# Patient Record
Sex: Female | Born: 1940 | Race: White | Hispanic: No | Smoking: Never smoker
Health system: Southern US, Community
[De-identification: ages and names within clinical notes are randomized; demographics above are authoritative.]

## PROBLEM LIST (undated history)

## (undated) DIAGNOSIS — IMO0001 Reserved for inherently not codable concepts without codable children: Secondary | ICD-10-CM

## (undated) DIAGNOSIS — T7840XA Allergy, unspecified, initial encounter: Secondary | ICD-10-CM

## (undated) DIAGNOSIS — E119 Type 2 diabetes mellitus without complications: Secondary | ICD-10-CM

## (undated) DIAGNOSIS — M329 Systemic lupus erythematosus, unspecified: Secondary | ICD-10-CM

## (undated) DIAGNOSIS — I82409 Acute embolism and thrombosis of unspecified deep veins of unspecified lower extremity: Secondary | ICD-10-CM

## (undated) DIAGNOSIS — Z531 Procedure and treatment not carried out because of patient's decision for reasons of belief and group pressure: Secondary | ICD-10-CM

## (undated) DIAGNOSIS — C4490 Unspecified malignant neoplasm of skin, unspecified: Secondary | ICD-10-CM

## (undated) DIAGNOSIS — I255 Ischemic cardiomyopathy: Secondary | ICD-10-CM

## (undated) DIAGNOSIS — I2699 Other pulmonary embolism without acute cor pulmonale: Secondary | ICD-10-CM

## (undated) DIAGNOSIS — E785 Hyperlipidemia, unspecified: Secondary | ICD-10-CM

## (undated) DIAGNOSIS — I10 Essential (primary) hypertension: Secondary | ICD-10-CM

## (undated) DIAGNOSIS — Z9889 Other specified postprocedural states: Secondary | ICD-10-CM

## (undated) DIAGNOSIS — D0359 Melanoma in situ of other part of trunk: Secondary | ICD-10-CM

## (undated) DIAGNOSIS — K219 Gastro-esophageal reflux disease without esophagitis: Secondary | ICD-10-CM

## (undated) DIAGNOSIS — R7401 Elevation of levels of liver transaminase levels: Secondary | ICD-10-CM

## (undated) DIAGNOSIS — R740 Nonspecific elevation of levels of transaminase and lactic acid dehydrogenase [LDH]: Secondary | ICD-10-CM

## (undated) DIAGNOSIS — R112 Nausea with vomiting, unspecified: Secondary | ICD-10-CM

## (undated) DIAGNOSIS — I5030 Unspecified diastolic (congestive) heart failure: Secondary | ICD-10-CM

## (undated) DIAGNOSIS — M479 Spondylosis, unspecified: Secondary | ICD-10-CM

## (undated) DIAGNOSIS — IMO0002 Reserved for concepts with insufficient information to code with codable children: Secondary | ICD-10-CM

## (undated) DIAGNOSIS — M1990 Unspecified osteoarthritis, unspecified site: Secondary | ICD-10-CM

## (undated) DIAGNOSIS — D689 Coagulation defect, unspecified: Secondary | ICD-10-CM

## (undated) DIAGNOSIS — A0472 Enterocolitis due to Clostridium difficile, not specified as recurrent: Secondary | ICD-10-CM

## (undated) DIAGNOSIS — M8580 Other specified disorders of bone density and structure, unspecified site: Secondary | ICD-10-CM

## (undated) DIAGNOSIS — I2510 Atherosclerotic heart disease of native coronary artery without angina pectoris: Secondary | ICD-10-CM

## (undated) DIAGNOSIS — K635 Polyp of colon: Secondary | ICD-10-CM

## (undated) DIAGNOSIS — C50919 Malignant neoplasm of unspecified site of unspecified female breast: Secondary | ICD-10-CM

## (undated) HISTORY — DX: Gastro-esophageal reflux disease without esophagitis: K21.9

## (undated) HISTORY — PX: APPENDECTOMY: SHX54

## (undated) HISTORY — DX: Acute embolism and thrombosis of unspecified deep veins of unspecified lower extremity: I82.409

## (undated) HISTORY — PX: BREAST LUMPECTOMY: SHX2

## (undated) HISTORY — DX: Malignant neoplasm of unspecified site of unspecified female breast: C50.919

## (undated) HISTORY — PX: CHOLECYSTECTOMY: SHX55

## (undated) HISTORY — DX: Essential (primary) hypertension: I10

## (undated) HISTORY — DX: Allergy, unspecified, initial encounter: T78.40XA

## (undated) HISTORY — DX: Other pulmonary embolism without acute cor pulmonale: I26.99

## (undated) HISTORY — DX: Spondylosis, unspecified: M47.9

## (undated) HISTORY — DX: Unspecified malignant neoplasm of skin, unspecified: C44.90

## (undated) HISTORY — DX: Unspecified osteoarthritis, unspecified site: M19.90

## (undated) HISTORY — DX: Systemic lupus erythematosus, unspecified: M32.9

## (undated) HISTORY — DX: Other specified disorders of bone density and structure, unspecified site: M85.80

## (undated) HISTORY — DX: Enterocolitis due to Clostridium difficile, not specified as recurrent: A04.72

## (undated) HISTORY — PX: BREAST BIOPSY: SHX20

## (undated) HISTORY — DX: Type 2 diabetes mellitus without complications: E11.9

## (undated) HISTORY — PX: SKIN CANCER EXCISION: SHX779

## (undated) HISTORY — DX: Melanoma in situ of other part of trunk: D03.59

## (undated) HISTORY — DX: Coagulation defect, unspecified: D68.9

## (undated) HISTORY — DX: Polyp of colon: K63.5

## (undated) HISTORY — DX: Reserved for concepts with insufficient information to code with codable children: IMO0002

---

## 2002-09-27 ENCOUNTER — Encounter (INDEPENDENT_AMBULATORY_CARE_PROVIDER_SITE_OTHER): Payer: Self-pay | Admitting: *Deleted

## 2002-09-27 ENCOUNTER — Encounter: Payer: Self-pay | Admitting: Family Medicine

## 2002-09-27 ENCOUNTER — Encounter: Admission: RE | Admit: 2002-09-27 | Discharge: 2002-09-27 | Payer: Self-pay | Admitting: Family Medicine

## 2002-11-23 ENCOUNTER — Ambulatory Visit (HOSPITAL_BASED_OUTPATIENT_CLINIC_OR_DEPARTMENT_OTHER): Admission: RE | Admit: 2002-11-23 | Discharge: 2002-11-23 | Payer: Self-pay | Admitting: General Surgery

## 2002-11-23 ENCOUNTER — Encounter: Admission: RE | Admit: 2002-11-23 | Discharge: 2002-11-23 | Payer: Self-pay | Admitting: General Surgery

## 2002-11-23 ENCOUNTER — Encounter: Payer: Self-pay | Admitting: General Surgery

## 2002-11-23 ENCOUNTER — Encounter (INDEPENDENT_AMBULATORY_CARE_PROVIDER_SITE_OTHER): Payer: Self-pay | Admitting: *Deleted

## 2002-12-08 ENCOUNTER — Ambulatory Visit: Admission: RE | Admit: 2002-12-08 | Discharge: 2003-03-08 | Payer: Self-pay | Admitting: Radiation Oncology

## 2003-04-06 ENCOUNTER — Ambulatory Visit: Admission: RE | Admit: 2003-04-06 | Discharge: 2003-04-06 | Payer: Self-pay | Admitting: Radiation Oncology

## 2003-07-14 ENCOUNTER — Encounter: Payer: Self-pay | Admitting: General Surgery

## 2003-07-14 ENCOUNTER — Encounter: Admission: RE | Admit: 2003-07-14 | Discharge: 2003-07-14 | Payer: Self-pay | Admitting: General Surgery

## 2003-11-08 ENCOUNTER — Ambulatory Visit: Admission: RE | Admit: 2003-11-08 | Discharge: 2003-11-08 | Payer: Self-pay | Admitting: Radiation Oncology

## 2003-12-21 ENCOUNTER — Ambulatory Visit: Admission: RE | Admit: 2003-12-21 | Discharge: 2003-12-21 | Payer: Self-pay | Admitting: Radiation Oncology

## 2003-12-26 ENCOUNTER — Encounter: Admission: RE | Admit: 2003-12-26 | Discharge: 2003-12-26 | Payer: Self-pay | Admitting: General Surgery

## 2004-10-04 ENCOUNTER — Ambulatory Visit: Payer: Self-pay | Admitting: Family Medicine

## 2004-10-17 ENCOUNTER — Encounter: Admission: RE | Admit: 2004-10-17 | Discharge: 2004-10-17 | Payer: Self-pay | Admitting: Family Medicine

## 2004-10-21 ENCOUNTER — Ambulatory Visit: Payer: Self-pay | Admitting: Family Medicine

## 2004-10-21 ENCOUNTER — Other Ambulatory Visit: Admission: RE | Admit: 2004-10-21 | Discharge: 2004-10-21 | Payer: Self-pay | Admitting: Family Medicine

## 2004-10-21 LAB — CONVERTED CEMR LAB: Pap Smear: NORMAL

## 2004-10-24 ENCOUNTER — Ambulatory Visit: Payer: Self-pay | Admitting: Gastroenterology

## 2004-10-24 ENCOUNTER — Encounter (INDEPENDENT_AMBULATORY_CARE_PROVIDER_SITE_OTHER): Payer: Self-pay | Admitting: *Deleted

## 2004-10-30 ENCOUNTER — Ambulatory Visit: Payer: Self-pay | Admitting: Gastroenterology

## 2004-11-25 ENCOUNTER — Ambulatory Visit: Payer: Self-pay | Admitting: Gastroenterology

## 2004-12-09 ENCOUNTER — Ambulatory Visit: Payer: Self-pay | Admitting: Internal Medicine

## 2004-12-20 ENCOUNTER — Ambulatory Visit: Payer: Self-pay | Admitting: Family Medicine

## 2004-12-23 ENCOUNTER — Observation Stay (HOSPITAL_COMMUNITY): Admission: RE | Admit: 2004-12-23 | Discharge: 2004-12-24 | Payer: Self-pay | Admitting: General Surgery

## 2004-12-23 ENCOUNTER — Encounter (INDEPENDENT_AMBULATORY_CARE_PROVIDER_SITE_OTHER): Payer: Self-pay | Admitting: Specialist

## 2005-02-24 ENCOUNTER — Encounter: Admission: RE | Admit: 2005-02-24 | Discharge: 2005-02-24 | Payer: Self-pay | Admitting: Radiation Oncology

## 2005-03-31 ENCOUNTER — Ambulatory Visit: Payer: Self-pay | Admitting: Family Medicine

## 2005-04-15 ENCOUNTER — Ambulatory Visit: Payer: Self-pay | Admitting: Family Medicine

## 2005-06-10 ENCOUNTER — Ambulatory Visit: Payer: Self-pay | Admitting: Internal Medicine

## 2005-06-13 ENCOUNTER — Encounter: Admission: RE | Admit: 2005-06-13 | Discharge: 2005-06-13 | Payer: Self-pay | Admitting: Internal Medicine

## 2005-09-08 ENCOUNTER — Ambulatory Visit: Payer: Self-pay | Admitting: Internal Medicine

## 2005-09-26 ENCOUNTER — Ambulatory Visit: Payer: Self-pay | Admitting: Internal Medicine

## 2005-11-07 ENCOUNTER — Ambulatory Visit: Payer: Self-pay | Admitting: Internal Medicine

## 2006-01-09 ENCOUNTER — Ambulatory Visit: Payer: Self-pay | Admitting: Internal Medicine

## 2006-01-23 ENCOUNTER — Ambulatory Visit: Payer: Self-pay | Admitting: Internal Medicine

## 2006-01-27 ENCOUNTER — Ambulatory Visit: Payer: Self-pay | Admitting: Internal Medicine

## 2006-01-27 ENCOUNTER — Encounter (INDEPENDENT_AMBULATORY_CARE_PROVIDER_SITE_OTHER): Payer: Self-pay | Admitting: Specialist

## 2006-02-27 ENCOUNTER — Ambulatory Visit: Payer: Self-pay | Admitting: Internal Medicine

## 2006-03-11 ENCOUNTER — Encounter: Admission: RE | Admit: 2006-03-11 | Discharge: 2006-04-17 | Payer: Self-pay | Admitting: Internal Medicine

## 2006-05-08 ENCOUNTER — Encounter: Admission: RE | Admit: 2006-05-08 | Discharge: 2006-05-08 | Payer: Self-pay | Admitting: Internal Medicine

## 2006-05-08 ENCOUNTER — Ambulatory Visit: Payer: Self-pay | Admitting: Internal Medicine

## 2006-05-29 ENCOUNTER — Ambulatory Visit: Payer: Self-pay | Admitting: Internal Medicine

## 2006-08-28 ENCOUNTER — Ambulatory Visit: Payer: Self-pay | Admitting: Internal Medicine

## 2006-09-28 ENCOUNTER — Ambulatory Visit: Payer: Self-pay | Admitting: Internal Medicine

## 2006-10-01 ENCOUNTER — Encounter: Payer: Self-pay | Admitting: Internal Medicine

## 2006-10-01 ENCOUNTER — Ambulatory Visit: Payer: Self-pay

## 2006-10-13 ENCOUNTER — Ambulatory Visit: Payer: Self-pay | Admitting: Internal Medicine

## 2007-01-01 ENCOUNTER — Ambulatory Visit: Payer: Self-pay | Admitting: Internal Medicine

## 2007-01-01 LAB — CONVERTED CEMR LAB
ALT: 28 units/L (ref 0–40)
Creatinine, Ser: 0.7 mg/dL (ref 0.4–1.2)
Hgb A1c MFr Bld: 9.4 % — ABNORMAL HIGH (ref 4.6–6.0)

## 2007-01-11 ENCOUNTER — Ambulatory Visit: Payer: Self-pay | Admitting: Internal Medicine

## 2007-06-18 ENCOUNTER — Encounter: Admission: RE | Admit: 2007-06-18 | Discharge: 2007-06-18 | Payer: Self-pay | Admitting: Internal Medicine

## 2007-06-18 ENCOUNTER — Encounter (INDEPENDENT_AMBULATORY_CARE_PROVIDER_SITE_OTHER): Payer: Self-pay | Admitting: *Deleted

## 2007-06-22 DIAGNOSIS — M949 Disorder of cartilage, unspecified: Secondary | ICD-10-CM

## 2007-06-22 DIAGNOSIS — M899 Disorder of bone, unspecified: Secondary | ICD-10-CM | POA: Insufficient documentation

## 2007-06-24 ENCOUNTER — Ambulatory Visit: Payer: Self-pay | Admitting: Internal Medicine

## 2007-06-24 DIAGNOSIS — M2550 Pain in unspecified joint: Secondary | ICD-10-CM | POA: Insufficient documentation

## 2007-06-28 LAB — CONVERTED CEMR LAB
Rhuematoid fact SerPl-aCnc: <20 intl units/mL — ABNORMAL LOW (ref 0.0–20.0)
Sed Rate: 27 mm/hr — ABNORMAL HIGH (ref 0–25)

## 2007-06-29 ENCOUNTER — Encounter (INDEPENDENT_AMBULATORY_CARE_PROVIDER_SITE_OTHER): Payer: Self-pay | Admitting: *Deleted

## 2007-08-02 ENCOUNTER — Encounter: Payer: Self-pay | Admitting: Internal Medicine

## 2007-08-13 ENCOUNTER — Ambulatory Visit: Payer: Self-pay | Admitting: Internal Medicine

## 2007-08-13 LAB — CONVERTED CEMR LAB
Bilirubin Urine: NEGATIVE
Glucose, Urine, Semiquant: NEGATIVE
Ketones, urine, test strip: NEGATIVE
Nitrite: NEGATIVE
Specific Gravity, Urine: 1.02
pH: 5

## 2007-09-06 ENCOUNTER — Telehealth (INDEPENDENT_AMBULATORY_CARE_PROVIDER_SITE_OTHER): Payer: Self-pay | Admitting: *Deleted

## 2007-10-12 ENCOUNTER — Ambulatory Visit: Payer: Self-pay | Admitting: Internal Medicine

## 2007-10-19 ENCOUNTER — Telehealth (INDEPENDENT_AMBULATORY_CARE_PROVIDER_SITE_OTHER): Payer: Self-pay | Admitting: *Deleted

## 2008-03-23 ENCOUNTER — Telehealth (INDEPENDENT_AMBULATORY_CARE_PROVIDER_SITE_OTHER): Payer: Self-pay | Admitting: *Deleted

## 2008-03-29 ENCOUNTER — Encounter: Payer: Self-pay | Admitting: Internal Medicine

## 2008-04-06 ENCOUNTER — Telehealth (INDEPENDENT_AMBULATORY_CARE_PROVIDER_SITE_OTHER): Payer: Self-pay | Admitting: *Deleted

## 2008-04-19 ENCOUNTER — Ambulatory Visit: Payer: Self-pay | Admitting: Internal Medicine

## 2008-04-21 ENCOUNTER — Encounter: Payer: Self-pay | Admitting: Internal Medicine

## 2008-04-27 ENCOUNTER — Telehealth (INDEPENDENT_AMBULATORY_CARE_PROVIDER_SITE_OTHER): Payer: Self-pay | Admitting: *Deleted

## 2008-05-16 ENCOUNTER — Encounter: Payer: Self-pay | Admitting: Internal Medicine

## 2008-05-17 ENCOUNTER — Ambulatory Visit: Payer: Self-pay | Admitting: Internal Medicine

## 2008-05-18 ENCOUNTER — Telehealth: Payer: Self-pay | Admitting: Family Medicine

## 2008-05-18 ENCOUNTER — Ambulatory Visit: Payer: Self-pay | Admitting: Internal Medicine

## 2008-05-18 ENCOUNTER — Inpatient Hospital Stay (HOSPITAL_COMMUNITY): Admission: EM | Admit: 2008-05-18 | Discharge: 2008-05-20 | Payer: Self-pay | Admitting: Emergency Medicine

## 2008-05-18 ENCOUNTER — Ambulatory Visit: Payer: Self-pay

## 2008-05-22 ENCOUNTER — Ambulatory Visit: Payer: Self-pay | Admitting: Internal Medicine

## 2008-05-25 ENCOUNTER — Ambulatory Visit: Payer: Self-pay | Admitting: Internal Medicine

## 2008-05-25 LAB — CONVERTED CEMR LAB
INR: 1.9
Prothrombin Time: 17.1 s

## 2008-05-29 ENCOUNTER — Ambulatory Visit: Payer: Self-pay | Admitting: Internal Medicine

## 2008-05-29 LAB — CONVERTED CEMR LAB: INR: 2.9

## 2008-05-30 ENCOUNTER — Encounter: Payer: Self-pay | Admitting: Internal Medicine

## 2008-06-01 ENCOUNTER — Ambulatory Visit: Payer: Self-pay | Admitting: Internal Medicine

## 2008-06-01 LAB — CONVERTED CEMR LAB
INR: 19.3
Prothrombin Time: 2.5 s

## 2008-06-12 ENCOUNTER — Ambulatory Visit: Payer: Self-pay | Admitting: Family Medicine

## 2008-06-12 ENCOUNTER — Ambulatory Visit: Payer: Self-pay | Admitting: Internal Medicine

## 2008-06-12 LAB — CONVERTED CEMR LAB
Glucose, Urine, Semiquant: NEGATIVE
INR: 3
Nitrite: NEGATIVE
Prothrombin Time: 20.8 s
Specific Gravity, Urine: 1.015

## 2008-06-15 ENCOUNTER — Telehealth (INDEPENDENT_AMBULATORY_CARE_PROVIDER_SITE_OTHER): Payer: Self-pay | Admitting: *Deleted

## 2008-06-19 ENCOUNTER — Ambulatory Visit: Payer: Self-pay | Admitting: Internal Medicine

## 2008-06-19 LAB — CONVERTED CEMR LAB
INR: 2.5
Prothrombin Time: 19.2 s

## 2008-06-29 ENCOUNTER — Emergency Department (HOSPITAL_BASED_OUTPATIENT_CLINIC_OR_DEPARTMENT_OTHER): Admission: EM | Admit: 2008-06-29 | Discharge: 2008-06-30 | Payer: Self-pay | Admitting: Emergency Medicine

## 2008-07-06 ENCOUNTER — Encounter: Admission: RE | Admit: 2008-07-06 | Discharge: 2008-07-06 | Payer: Self-pay | Admitting: Internal Medicine

## 2008-07-10 ENCOUNTER — Ambulatory Visit: Payer: Self-pay | Admitting: Internal Medicine

## 2008-07-10 LAB — CONVERTED CEMR LAB: Prothrombin Time: 21.5 s

## 2008-07-31 ENCOUNTER — Ambulatory Visit: Payer: Self-pay | Admitting: Internal Medicine

## 2008-07-31 LAB — CONVERTED CEMR LAB
Bilirubin Urine: NEGATIVE
Hemoglobin: 13.2 g/dL
Ketones, urine, test strip: NEGATIVE
Nitrite: NEGATIVE
Protein, U semiquant: NEGATIVE
RBC / HPF: NONE SEEN (ref <3)
Squamous Epithelial / LPF: NONE SEEN /lpf
Urobilinogen, UA: 0.2

## 2008-08-01 ENCOUNTER — Encounter: Payer: Self-pay | Admitting: Internal Medicine

## 2008-08-02 ENCOUNTER — Telehealth: Payer: Self-pay | Admitting: Internal Medicine

## 2008-08-10 ENCOUNTER — Ambulatory Visit: Payer: Self-pay | Admitting: Internal Medicine

## 2008-08-10 LAB — CONVERTED CEMR LAB: INR: 3.7

## 2008-08-21 ENCOUNTER — Ambulatory Visit: Payer: Self-pay | Admitting: Internal Medicine

## 2008-08-21 ENCOUNTER — Telehealth (INDEPENDENT_AMBULATORY_CARE_PROVIDER_SITE_OTHER): Payer: Self-pay | Admitting: *Deleted

## 2008-08-21 ENCOUNTER — Ambulatory Visit: Payer: Self-pay | Admitting: Cardiology

## 2008-08-21 LAB — CONVERTED CEMR LAB
BUN: 15 mg/dL (ref 6–23)
Basophils Absolute: 0.1 10*3/uL (ref 0.0–0.1)
Basophils Relative: 0.9 % (ref 0.0–3.0)
Bilirubin Urine: NEGATIVE
Blood in Urine, dipstick: NEGATIVE
CO2: 29 meq/L (ref 19–32)
Calcium: 9.2 mg/dL (ref 8.4–10.5)
Chloride: 104 meq/L (ref 96–112)
Creatinine, Ser: 0.7 mg/dL (ref 0.4–1.2)
Eosinophils Absolute: 0.1 10*3/uL (ref 0.0–0.7)
Eosinophils Relative: 1.1 % (ref 0.0–5.0)
GFR calc Af Amer: 107 mL/min
GFR calc non Af Amer: 88 mL/min
Glucose, Bld: 215 mg/dL — ABNORMAL HIGH (ref 70–99)
Glucose, Urine, Semiquant: NEGATIVE
HCT: 37.5 % (ref 36.0–46.0)
Hemoglobin: 11.2 g/dL
Hemoglobin: 12.5 g/dL (ref 12.0–15.0)
INR: 2.3
Ketones, urine, test strip: NEGATIVE
Lymphocytes Relative: 25.1 % (ref 12.0–46.0)
MCHC: 33.2 g/dL (ref 30.0–36.0)
MCV: 82.2 fL (ref 78.0–100.0)
Monocytes Absolute: 0.5 10*3/uL (ref 0.1–1.0)
Monocytes Relative: 6.5 % (ref 3.0–12.0)
Neutro Abs: 5.2 10*3/uL (ref 1.4–7.7)
Neutrophils Relative %: 66.4 % (ref 43.0–77.0)
Nitrite: NEGATIVE
Platelets: 188 10*3/uL (ref 150–400)
Potassium: 4.3 meq/L (ref 3.5–5.1)
Protein, U semiquant: NEGATIVE
Prothrombin Time: 18.4 s
RBC: 4.57 M/uL (ref 3.87–5.11)
RDW: 13.1 % (ref 11.5–14.6)
Sodium: 139 meq/L (ref 135–145)
Specific Gravity, Urine: 1.01
Urobilinogen, UA: 0.2
WBC Urine, dipstick: NEGATIVE
WBC: 7.9 10*3/uL (ref 4.5–10.5)
pH: 5

## 2008-08-22 ENCOUNTER — Telehealth: Payer: Self-pay | Admitting: Internal Medicine

## 2008-08-22 ENCOUNTER — Encounter: Payer: Self-pay | Admitting: Internal Medicine

## 2008-08-28 ENCOUNTER — Telehealth (INDEPENDENT_AMBULATORY_CARE_PROVIDER_SITE_OTHER): Payer: Self-pay | Admitting: *Deleted

## 2008-08-31 ENCOUNTER — Observation Stay (HOSPITAL_COMMUNITY): Admission: EM | Admit: 2008-08-31 | Discharge: 2008-09-01 | Payer: Self-pay | Admitting: Emergency Medicine

## 2008-08-31 ENCOUNTER — Ambulatory Visit: Payer: Self-pay | Admitting: Internal Medicine

## 2008-09-01 ENCOUNTER — Encounter (INDEPENDENT_AMBULATORY_CARE_PROVIDER_SITE_OTHER): Payer: Self-pay | Admitting: *Deleted

## 2008-09-04 ENCOUNTER — Ambulatory Visit: Payer: Self-pay | Admitting: Internal Medicine

## 2008-09-04 DIAGNOSIS — K219 Gastro-esophageal reflux disease without esophagitis: Secondary | ICD-10-CM

## 2008-09-05 ENCOUNTER — Encounter (INDEPENDENT_AMBULATORY_CARE_PROVIDER_SITE_OTHER): Payer: Self-pay | Admitting: *Deleted

## 2008-09-06 ENCOUNTER — Telehealth: Payer: Self-pay | Admitting: Internal Medicine

## 2008-09-07 ENCOUNTER — Ambulatory Visit: Payer: Self-pay | Admitting: Internal Medicine

## 2008-09-07 DIAGNOSIS — R197 Diarrhea, unspecified: Secondary | ICD-10-CM | POA: Insufficient documentation

## 2008-09-07 LAB — CONVERTED CEMR LAB
Bilirubin Urine: NEGATIVE
Glucose, Urine, Semiquant: 500
Protein, U semiquant: NEGATIVE

## 2008-09-08 ENCOUNTER — Encounter: Payer: Self-pay | Admitting: Internal Medicine

## 2008-09-08 ENCOUNTER — Encounter (INDEPENDENT_AMBULATORY_CARE_PROVIDER_SITE_OTHER): Payer: Self-pay | Admitting: *Deleted

## 2008-09-13 ENCOUNTER — Ambulatory Visit: Payer: Self-pay

## 2008-09-13 ENCOUNTER — Encounter: Payer: Self-pay | Admitting: Internal Medicine

## 2008-09-13 ENCOUNTER — Encounter: Payer: Self-pay | Admitting: Cardiovascular Disease

## 2008-09-13 ENCOUNTER — Telehealth: Payer: Self-pay | Admitting: Internal Medicine

## 2008-09-14 ENCOUNTER — Telehealth (INDEPENDENT_AMBULATORY_CARE_PROVIDER_SITE_OTHER): Payer: Self-pay | Admitting: *Deleted

## 2008-09-14 ENCOUNTER — Ambulatory Visit: Payer: Self-pay | Admitting: Internal Medicine

## 2008-09-15 ENCOUNTER — Encounter: Payer: Self-pay | Admitting: Internal Medicine

## 2008-09-15 LAB — CONVERTED CEMR LAB
Hemoglobin, Urine: NEGATIVE
Nitrite: NEGATIVE
Protein, ur: NEGATIVE mg/dL
Urobilinogen, UA: 0.2 (ref 0.0–1.0)

## 2008-09-16 ENCOUNTER — Encounter: Payer: Self-pay | Admitting: Internal Medicine

## 2008-09-18 ENCOUNTER — Ambulatory Visit: Payer: Self-pay | Admitting: Internal Medicine

## 2008-09-21 ENCOUNTER — Telehealth (INDEPENDENT_AMBULATORY_CARE_PROVIDER_SITE_OTHER): Payer: Self-pay | Admitting: *Deleted

## 2008-10-09 DIAGNOSIS — I10 Essential (primary) hypertension: Secondary | ICD-10-CM

## 2008-10-09 DIAGNOSIS — M1990 Unspecified osteoarthritis, unspecified site: Secondary | ICD-10-CM | POA: Insufficient documentation

## 2008-10-09 DIAGNOSIS — Z860100 Personal history of colon polyps, unspecified: Secondary | ICD-10-CM | POA: Insufficient documentation

## 2008-10-10 ENCOUNTER — Ambulatory Visit: Payer: Self-pay | Admitting: Internal Medicine

## 2008-10-27 ENCOUNTER — Telehealth (INDEPENDENT_AMBULATORY_CARE_PROVIDER_SITE_OTHER): Payer: Self-pay | Admitting: *Deleted

## 2008-11-24 ENCOUNTER — Telehealth (INDEPENDENT_AMBULATORY_CARE_PROVIDER_SITE_OTHER): Payer: Self-pay | Admitting: *Deleted

## 2008-12-01 ENCOUNTER — Telehealth (INDEPENDENT_AMBULATORY_CARE_PROVIDER_SITE_OTHER): Payer: Self-pay | Admitting: *Deleted

## 2008-12-06 ENCOUNTER — Telehealth (INDEPENDENT_AMBULATORY_CARE_PROVIDER_SITE_OTHER): Payer: Self-pay | Admitting: *Deleted

## 2008-12-14 ENCOUNTER — Telehealth (INDEPENDENT_AMBULATORY_CARE_PROVIDER_SITE_OTHER): Payer: Self-pay | Admitting: *Deleted

## 2008-12-20 ENCOUNTER — Inpatient Hospital Stay (HOSPITAL_COMMUNITY): Admission: EM | Admit: 2008-12-20 | Discharge: 2008-12-22 | Payer: Self-pay | Admitting: Emergency Medicine

## 2008-12-20 ENCOUNTER — Encounter (INDEPENDENT_AMBULATORY_CARE_PROVIDER_SITE_OTHER): Payer: Self-pay | Admitting: Surgery

## 2008-12-20 ENCOUNTER — Ambulatory Visit: Payer: Self-pay | Admitting: Internal Medicine

## 2008-12-20 ENCOUNTER — Telehealth (INDEPENDENT_AMBULATORY_CARE_PROVIDER_SITE_OTHER): Payer: Self-pay | Admitting: *Deleted

## 2008-12-20 ENCOUNTER — Ambulatory Visit: Payer: Self-pay | Admitting: Interventional Radiology

## 2008-12-20 ENCOUNTER — Encounter: Payer: Self-pay | Admitting: Emergency Medicine

## 2008-12-21 ENCOUNTER — Encounter (INDEPENDENT_AMBULATORY_CARE_PROVIDER_SITE_OTHER): Payer: Self-pay | Admitting: Internal Medicine

## 2008-12-21 ENCOUNTER — Ambulatory Visit: Payer: Self-pay | Admitting: Oncology

## 2008-12-21 ENCOUNTER — Encounter: Payer: Self-pay | Admitting: Internal Medicine

## 2008-12-21 ENCOUNTER — Ambulatory Visit: Payer: Self-pay | Admitting: Surgery

## 2008-12-26 ENCOUNTER — Ambulatory Visit: Payer: Self-pay | Admitting: Oncology

## 2009-01-01 ENCOUNTER — Ambulatory Visit: Payer: Self-pay | Admitting: Internal Medicine

## 2009-01-16 ENCOUNTER — Encounter: Payer: Self-pay | Admitting: Internal Medicine

## 2009-01-30 ENCOUNTER — Telehealth: Payer: Self-pay | Admitting: Internal Medicine

## 2009-02-28 ENCOUNTER — Telehealth (INDEPENDENT_AMBULATORY_CARE_PROVIDER_SITE_OTHER): Payer: Self-pay | Admitting: *Deleted

## 2009-04-04 ENCOUNTER — Ambulatory Visit: Payer: Self-pay | Admitting: Family Medicine

## 2009-04-17 ENCOUNTER — Telehealth (INDEPENDENT_AMBULATORY_CARE_PROVIDER_SITE_OTHER): Payer: Self-pay | Admitting: *Deleted

## 2009-04-17 ENCOUNTER — Ambulatory Visit: Payer: Self-pay | Admitting: Oncology

## 2009-04-17 LAB — CONVERTED CEMR LAB
BUN: 13 mg/dL
CO2, serum: 29 mmol/L
Calcium: 9.5 mg/dL
Potassium, serum: 4.6 mmol/L
Sodium, serum: 139 mmol/L

## 2009-04-19 ENCOUNTER — Encounter: Payer: Self-pay | Admitting: Internal Medicine

## 2009-04-19 LAB — CBC WITH DIFFERENTIAL/PLATELET
BASO%: 0.4 % (ref 0.0–2.0)
Basophils Absolute: 0 10*3/uL (ref 0.0–0.1)
EOS%: 0.9 % (ref 0.0–7.0)
HCT: 37.5 % (ref 34.8–46.6)
HGB: 12.4 g/dL (ref 11.6–15.9)
LYMPH%: 20.3 % (ref 14.0–49.7)
MCH: 27.6 pg (ref 25.1–34.0)
MCHC: 33.1 g/dL (ref 31.5–36.0)
MCV: 83.2 fL (ref 79.5–101.0)
MONO%: 5.7 % (ref 0.0–14.0)
NEUT%: 72.7 % (ref 38.4–76.8)
lymph#: 1.8 10*3/uL (ref 0.9–3.3)

## 2009-04-19 LAB — COMPREHENSIVE METABOLIC PANEL
ALT: 21 U/L (ref 0–35)
AST: 17 U/L (ref 0–37)
Alkaline Phosphatase: 73 U/L (ref 39–117)
BUN: 18 mg/dL (ref 6–23)
Chloride: 105 mEq/L (ref 96–112)
Creatinine, Ser: 0.84 mg/dL (ref 0.40–1.20)
Total Bilirubin: 0.5 mg/dL (ref 0.3–1.2)

## 2009-04-30 ENCOUNTER — Ambulatory Visit: Payer: Self-pay | Admitting: *Deleted

## 2009-04-30 ENCOUNTER — Ambulatory Visit: Admission: RE | Admit: 2009-04-30 | Discharge: 2009-04-30 | Payer: Self-pay | Admitting: Oncology

## 2009-04-30 ENCOUNTER — Encounter: Payer: Self-pay | Admitting: Oncology

## 2009-05-04 ENCOUNTER — Ambulatory Visit: Payer: Self-pay | Admitting: Internal Medicine

## 2009-05-11 ENCOUNTER — Ambulatory Visit: Payer: Self-pay | Admitting: Internal Medicine

## 2009-05-17 ENCOUNTER — Ambulatory Visit: Payer: Self-pay | Admitting: Internal Medicine

## 2009-05-24 ENCOUNTER — Encounter (INDEPENDENT_AMBULATORY_CARE_PROVIDER_SITE_OTHER): Payer: Self-pay | Admitting: *Deleted

## 2009-05-24 LAB — CONVERTED CEMR LAB
Cholesterol: 174 mg/dL (ref 0–200)
HDL: 32.4 mg/dL — ABNORMAL LOW (ref >39.00)
VLDL: 19.4 mg/dL (ref 0.0–40.0)

## 2009-05-29 ENCOUNTER — Telehealth (INDEPENDENT_AMBULATORY_CARE_PROVIDER_SITE_OTHER): Payer: Self-pay | Admitting: *Deleted

## 2009-06-26 ENCOUNTER — Telehealth (INDEPENDENT_AMBULATORY_CARE_PROVIDER_SITE_OTHER): Payer: Self-pay | Admitting: *Deleted

## 2009-06-27 ENCOUNTER — Ambulatory Visit: Payer: Self-pay | Admitting: Oncology

## 2009-06-29 ENCOUNTER — Encounter: Payer: Self-pay | Admitting: Internal Medicine

## 2009-06-29 LAB — CBC WITH DIFFERENTIAL/PLATELET
BASO%: 0.4 % (ref 0.0–2.0)
EOS%: 1.4 % (ref 0.0–7.0)
MCH: 27.6 pg (ref 25.1–34.0)
MCHC: 33.1 g/dL (ref 31.5–36.0)
MONO%: 7.8 % (ref 0.0–14.0)
RDW: 13.6 % (ref 11.2–14.5)
lymph#: 2 10*3/uL (ref 0.9–3.3)

## 2009-06-29 LAB — COMPREHENSIVE METABOLIC PANEL
ALT: 27 U/L (ref 0–35)
AST: 22 U/L (ref 0–37)
Alkaline Phosphatase: 83 U/L (ref 39–117)
Calcium: 10 mg/dL (ref 8.4–10.5)
Chloride: 107 mEq/L (ref 96–112)
Creatinine, Ser: 0.83 mg/dL (ref 0.40–1.20)
Potassium: 4.7 mEq/L (ref 3.5–5.3)

## 2009-07-02 ENCOUNTER — Telehealth (INDEPENDENT_AMBULATORY_CARE_PROVIDER_SITE_OTHER): Payer: Self-pay | Admitting: *Deleted

## 2009-07-04 ENCOUNTER — Telehealth (INDEPENDENT_AMBULATORY_CARE_PROVIDER_SITE_OTHER): Payer: Self-pay | Admitting: *Deleted

## 2009-07-12 ENCOUNTER — Encounter: Admission: RE | Admit: 2009-07-12 | Discharge: 2009-07-12 | Payer: Self-pay | Admitting: Internal Medicine

## 2009-07-17 ENCOUNTER — Telehealth (INDEPENDENT_AMBULATORY_CARE_PROVIDER_SITE_OTHER): Payer: Self-pay | Admitting: *Deleted

## 2009-07-27 ENCOUNTER — Ambulatory Visit: Payer: Self-pay | Admitting: Internal Medicine

## 2009-07-27 DIAGNOSIS — R109 Unspecified abdominal pain: Secondary | ICD-10-CM | POA: Insufficient documentation

## 2009-07-27 DIAGNOSIS — R519 Headache, unspecified: Secondary | ICD-10-CM | POA: Insufficient documentation

## 2009-07-27 DIAGNOSIS — M79609 Pain in unspecified limb: Secondary | ICD-10-CM

## 2009-07-27 DIAGNOSIS — R51 Headache: Secondary | ICD-10-CM

## 2009-07-27 LAB — CONVERTED CEMR LAB
Bilirubin Urine: NEGATIVE
Protein, U semiquant: NEGATIVE
Specific Gravity, Urine: 1.015
Urobilinogen, UA: 0.2

## 2009-07-28 ENCOUNTER — Encounter: Payer: Self-pay | Admitting: Internal Medicine

## 2009-07-28 LAB — CONVERTED CEMR LAB: RBC / HPF: NONE SEEN (ref <3)

## 2009-08-01 ENCOUNTER — Encounter: Admission: RE | Admit: 2009-08-01 | Discharge: 2009-08-01 | Payer: Self-pay | Admitting: Internal Medicine

## 2009-08-02 ENCOUNTER — Telehealth: Payer: Self-pay | Admitting: Internal Medicine

## 2009-08-04 ENCOUNTER — Encounter: Admission: RE | Admit: 2009-08-04 | Discharge: 2009-08-04 | Payer: Self-pay | Admitting: Internal Medicine

## 2009-08-07 ENCOUNTER — Encounter: Payer: Self-pay | Admitting: Internal Medicine

## 2009-08-09 ENCOUNTER — Telehealth: Payer: Self-pay | Admitting: Internal Medicine

## 2009-08-15 ENCOUNTER — Telehealth: Payer: Self-pay | Admitting: Internal Medicine

## 2009-08-21 ENCOUNTER — Encounter: Payer: Self-pay | Admitting: Internal Medicine

## 2009-08-29 ENCOUNTER — Ambulatory Visit: Payer: Self-pay | Admitting: Oncology

## 2009-09-05 ENCOUNTER — Telehealth (INDEPENDENT_AMBULATORY_CARE_PROVIDER_SITE_OTHER): Payer: Self-pay | Admitting: *Deleted

## 2009-09-13 ENCOUNTER — Encounter: Payer: Self-pay | Admitting: Internal Medicine

## 2009-09-20 ENCOUNTER — Ambulatory Visit: Payer: Self-pay | Admitting: Internal Medicine

## 2009-09-20 ENCOUNTER — Telehealth (INDEPENDENT_AMBULATORY_CARE_PROVIDER_SITE_OTHER): Payer: Self-pay | Admitting: *Deleted

## 2009-09-21 ENCOUNTER — Encounter: Payer: Self-pay | Admitting: Internal Medicine

## 2009-09-28 ENCOUNTER — Encounter (INDEPENDENT_AMBULATORY_CARE_PROVIDER_SITE_OTHER): Payer: Self-pay | Admitting: *Deleted

## 2009-10-01 ENCOUNTER — Encounter: Payer: Self-pay | Admitting: Internal Medicine

## 2009-10-03 ENCOUNTER — Ambulatory Visit: Payer: Self-pay | Admitting: Oncology

## 2009-10-05 ENCOUNTER — Encounter: Payer: Self-pay | Admitting: Oncology

## 2009-10-05 ENCOUNTER — Ambulatory Visit (HOSPITAL_COMMUNITY): Admission: RE | Admit: 2009-10-05 | Discharge: 2009-10-05 | Payer: Self-pay | Admitting: Oncology

## 2009-10-05 ENCOUNTER — Ambulatory Visit: Payer: Self-pay | Admitting: Vascular Surgery

## 2009-10-05 ENCOUNTER — Ambulatory Visit: Admission: RE | Admit: 2009-10-05 | Discharge: 2009-10-05 | Payer: Self-pay | Admitting: Oncology

## 2009-10-05 ENCOUNTER — Encounter (INDEPENDENT_AMBULATORY_CARE_PROVIDER_SITE_OTHER): Payer: Self-pay | Admitting: *Deleted

## 2009-10-10 ENCOUNTER — Encounter: Payer: Self-pay | Admitting: Internal Medicine

## 2009-10-10 DIAGNOSIS — R0989 Other specified symptoms and signs involving the circulatory and respiratory systems: Secondary | ICD-10-CM

## 2009-10-12 ENCOUNTER — Encounter: Payer: Self-pay | Admitting: Internal Medicine

## 2009-10-12 ENCOUNTER — Ambulatory Visit: Payer: Self-pay

## 2009-10-18 ENCOUNTER — Encounter (INDEPENDENT_AMBULATORY_CARE_PROVIDER_SITE_OTHER): Payer: Self-pay | Admitting: *Deleted

## 2009-10-29 ENCOUNTER — Encounter: Payer: Self-pay | Admitting: Internal Medicine

## 2009-11-05 ENCOUNTER — Inpatient Hospital Stay (HOSPITAL_COMMUNITY): Admission: EM | Admit: 2009-11-05 | Discharge: 2009-11-07 | Payer: Self-pay | Admitting: Internal Medicine

## 2009-11-05 ENCOUNTER — Encounter: Payer: Self-pay | Admitting: Emergency Medicine

## 2009-11-05 ENCOUNTER — Telehealth: Payer: Self-pay | Admitting: Internal Medicine

## 2009-11-05 ENCOUNTER — Ambulatory Visit: Payer: Self-pay | Admitting: Diagnostic Radiology

## 2009-11-05 ENCOUNTER — Ambulatory Visit: Payer: Self-pay | Admitting: Cardiology

## 2009-11-06 ENCOUNTER — Telehealth: Payer: Self-pay | Admitting: Internal Medicine

## 2009-11-12 ENCOUNTER — Encounter: Payer: Self-pay | Admitting: Internal Medicine

## 2009-11-12 ENCOUNTER — Encounter: Admission: RE | Admit: 2009-11-12 | Discharge: 2009-11-12 | Payer: Self-pay | Admitting: Diagnostic Neuroimaging

## 2009-11-15 ENCOUNTER — Telehealth (INDEPENDENT_AMBULATORY_CARE_PROVIDER_SITE_OTHER): Payer: Self-pay | Admitting: *Deleted

## 2009-11-19 ENCOUNTER — Ambulatory Visit: Payer: Self-pay | Admitting: Internal Medicine

## 2009-11-19 DIAGNOSIS — M542 Cervicalgia: Secondary | ICD-10-CM | POA: Insufficient documentation

## 2009-11-19 DIAGNOSIS — R42 Dizziness and giddiness: Secondary | ICD-10-CM

## 2009-11-26 LAB — CONVERTED CEMR LAB
Basophils Relative: 0.5 % (ref 0.0–3.0)
Hemoglobin: 12.7 g/dL (ref 12.0–15.0)
Lymphocytes Relative: 24.9 % (ref 12.0–46.0)
Monocytes Relative: 6.3 % (ref 3.0–12.0)
Neutro Abs: 5.7 10*3/uL (ref 1.4–7.7)
RBC: 4.38 M/uL (ref 3.87–5.11)
WBC: 8.4 10*3/uL (ref 4.5–10.5)

## 2009-11-29 ENCOUNTER — Telehealth (INDEPENDENT_AMBULATORY_CARE_PROVIDER_SITE_OTHER): Payer: Self-pay | Admitting: *Deleted

## 2009-12-03 ENCOUNTER — Ambulatory Visit: Payer: Self-pay | Admitting: Oncology

## 2009-12-20 ENCOUNTER — Telehealth (INDEPENDENT_AMBULATORY_CARE_PROVIDER_SITE_OTHER): Payer: Self-pay | Admitting: *Deleted

## 2009-12-21 ENCOUNTER — Encounter: Payer: Self-pay | Admitting: Internal Medicine

## 2009-12-25 ENCOUNTER — Ambulatory Visit (HOSPITAL_BASED_OUTPATIENT_CLINIC_OR_DEPARTMENT_OTHER): Admission: RE | Admit: 2009-12-25 | Discharge: 2009-12-25 | Payer: Self-pay | Admitting: Internal Medicine

## 2009-12-25 ENCOUNTER — Ambulatory Visit: Payer: Self-pay | Admitting: Internal Medicine

## 2009-12-25 ENCOUNTER — Ambulatory Visit: Payer: Self-pay | Admitting: Diagnostic Radiology

## 2009-12-25 ENCOUNTER — Telehealth (INDEPENDENT_AMBULATORY_CARE_PROVIDER_SITE_OTHER): Payer: Self-pay | Admitting: *Deleted

## 2009-12-25 LAB — CONVERTED CEMR LAB: Hemoglobin: 13.4 g/dL

## 2009-12-31 ENCOUNTER — Telehealth (INDEPENDENT_AMBULATORY_CARE_PROVIDER_SITE_OTHER): Payer: Self-pay | Admitting: *Deleted

## 2010-01-02 ENCOUNTER — Telehealth: Payer: Self-pay | Admitting: Internal Medicine

## 2010-01-02 DIAGNOSIS — H9209 Otalgia, unspecified ear: Secondary | ICD-10-CM | POA: Insufficient documentation

## 2010-01-04 ENCOUNTER — Telehealth (INDEPENDENT_AMBULATORY_CARE_PROVIDER_SITE_OTHER): Payer: Self-pay | Admitting: *Deleted

## 2010-01-11 ENCOUNTER — Encounter: Payer: Self-pay | Admitting: Internal Medicine

## 2010-01-11 ENCOUNTER — Ambulatory Visit: Payer: Self-pay | Admitting: Family

## 2010-01-11 LAB — CONVERTED CEMR LAB
ALT: 25 units/L (ref 0–35)
AST: 23 units/L (ref 0–37)
Basophils Absolute: 0 10*3/uL (ref 0.0–0.1)
Basophils Relative: 0.2 % (ref 0.0–3.0)
Bilirubin, Direct: 0.1 mg/dL (ref 0.0–0.3)
CO2: 30 meq/L (ref 19–32)
Chloride: 103 meq/L (ref 96–112)
Creatinine, Ser: 0.7 mg/dL (ref 0.4–1.2)
Eosinophils Absolute: 0.1 10*3/uL (ref 0.0–0.7)
INR: 1 (ref 0.8–1.0)
Lymphocytes Relative: 21.7 % (ref 12.0–46.0)
MCHC: 33.2 g/dL (ref 30.0–36.0)
MCV: 84 fL (ref 78.0–100.0)
Monocytes Absolute: 0.7 10*3/uL (ref 0.1–1.0)
Neutrophils Relative %: 70.6 % (ref 43.0–77.0)
Platelets: 188 10*3/uL (ref 150.0–400.0)
Potassium: 4.7 meq/L (ref 3.5–5.1)
Prothrombin Time: 9.9 s (ref 9.1–11.7)
RBC: 4.45 M/uL (ref 3.87–5.11)
RDW: 12.6 % (ref 11.5–14.6)
Total Bilirubin: 0.5 mg/dL (ref 0.3–1.2)
Total Protein: 7.4 g/dL (ref 6.0–8.3)
aPTT: 24.3 s (ref 21.7–28.8)

## 2010-01-14 ENCOUNTER — Encounter: Payer: Self-pay | Admitting: Family

## 2010-01-14 ENCOUNTER — Telehealth: Payer: Self-pay | Admitting: Family

## 2010-01-15 ENCOUNTER — Encounter: Payer: Self-pay | Admitting: Family

## 2010-01-16 ENCOUNTER — Encounter: Payer: Self-pay | Admitting: Family

## 2010-01-20 ENCOUNTER — Telehealth: Payer: Self-pay | Admitting: Family

## 2010-01-21 ENCOUNTER — Encounter: Payer: Self-pay | Admitting: Internal Medicine

## 2010-01-22 ENCOUNTER — Telehealth (INDEPENDENT_AMBULATORY_CARE_PROVIDER_SITE_OTHER): Payer: Self-pay | Admitting: *Deleted

## 2010-01-29 ENCOUNTER — Ambulatory Visit: Payer: Self-pay | Admitting: Diagnostic Radiology

## 2010-01-29 ENCOUNTER — Ambulatory Visit (HOSPITAL_BASED_OUTPATIENT_CLINIC_OR_DEPARTMENT_OTHER): Admission: RE | Admit: 2010-01-29 | Discharge: 2010-01-29 | Payer: Self-pay | Admitting: Internal Medicine

## 2010-01-30 ENCOUNTER — Encounter: Payer: Self-pay | Admitting: Family

## 2010-01-30 ENCOUNTER — Telehealth: Payer: Self-pay | Admitting: Family

## 2010-02-04 ENCOUNTER — Telehealth (INDEPENDENT_AMBULATORY_CARE_PROVIDER_SITE_OTHER): Payer: Self-pay | Admitting: *Deleted

## 2010-02-19 IMAGING — CT CT ANGIO CHEST
2 of 6 series · 18 of 36 positions shown · IV contrast (APPLIED)
Comparison: None

CLINICAL DATA: Right-sided pleuritic chest pain.  Known history of
DVT.  Breast carcinoma.  Clinical suspicion for pulmonary embolism.

CT ANGIOGRAPHY CHEST
TECHNIQUE: Multidetector CT imaging of the chest using the
standard protocol during bolus administration of intravenous
contrast. Multiplanar reconstructed images including MIPs were
obtained and reviewed to evaluate the vascular anatomy.
Contrast: 80 ml Omnipaque 300

[Series 8: pulm embolism 1.0 b25f thins · axial · 0.63mm/px · z∈[+1178,+1418]mm · 15 of 276 slices shown]
[im 18/276  lung]
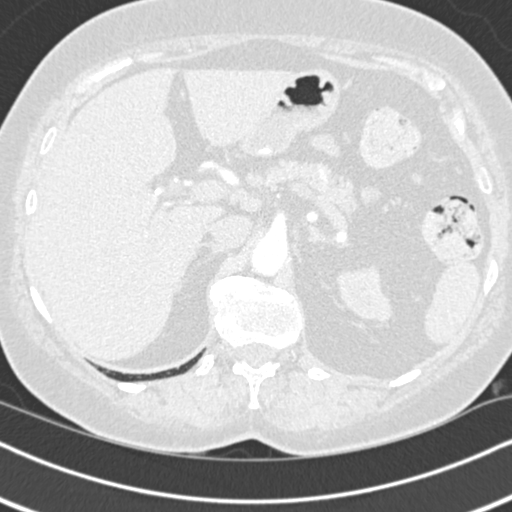
[im 35/276  mediastinal]
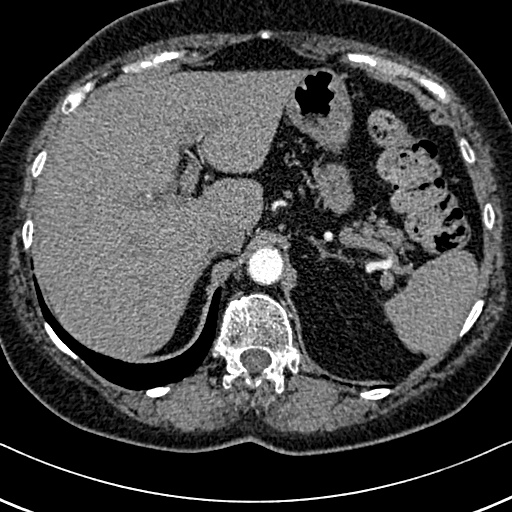
[im 52/276  lung]
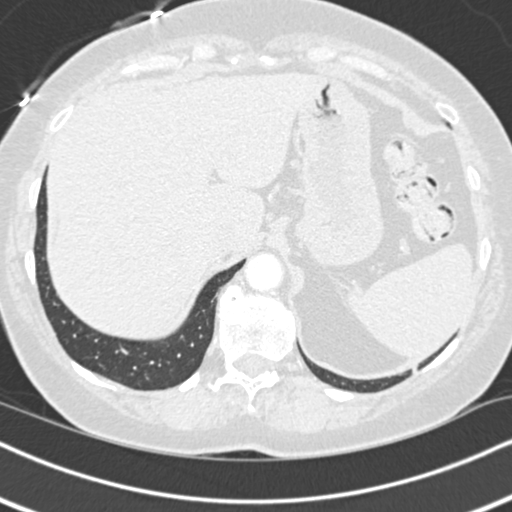
[im 69/276  mediastinal]
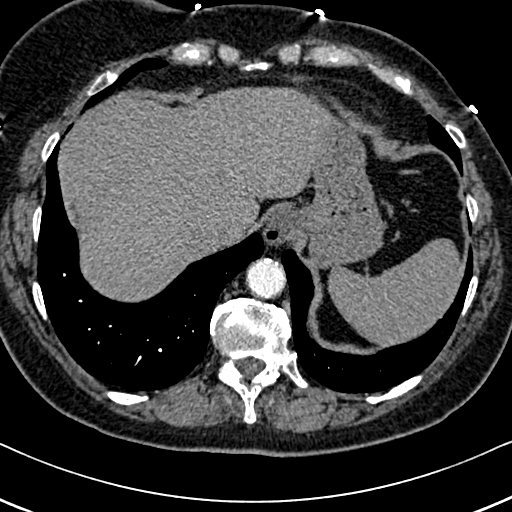
[im 86/276  lung]
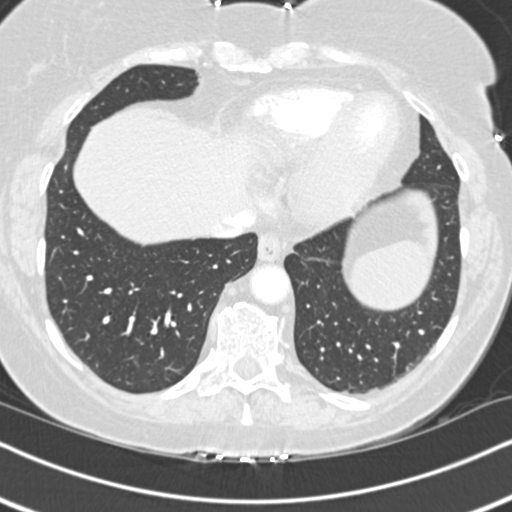
[im 104/276  mediastinal]
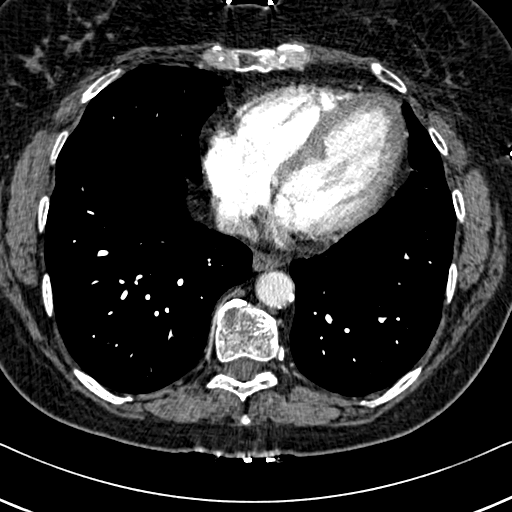
[im 121/276  lung]
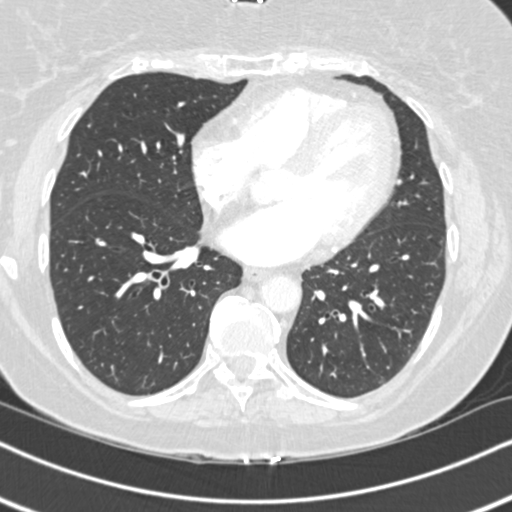
[im 138/276  mediastinal]
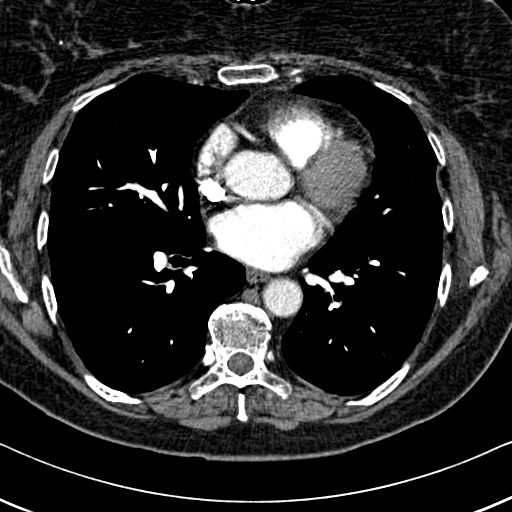
[im 155/276  lung]
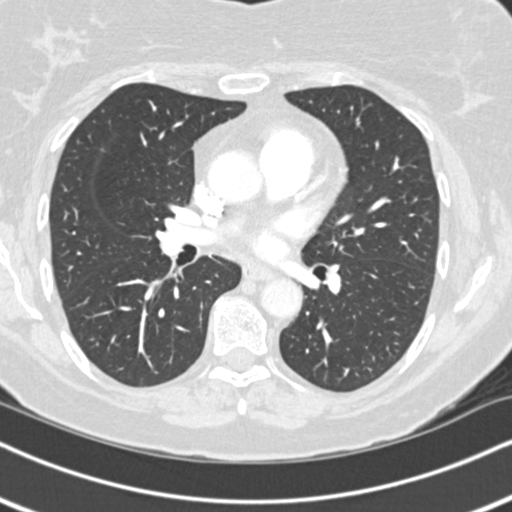
[im 172/276  mediastinal]
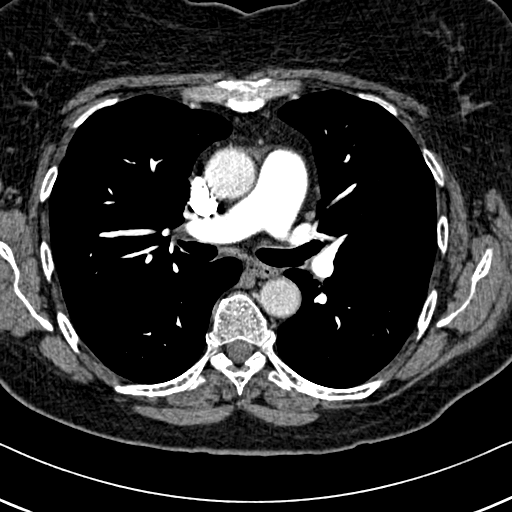
[im 190/276  lung]
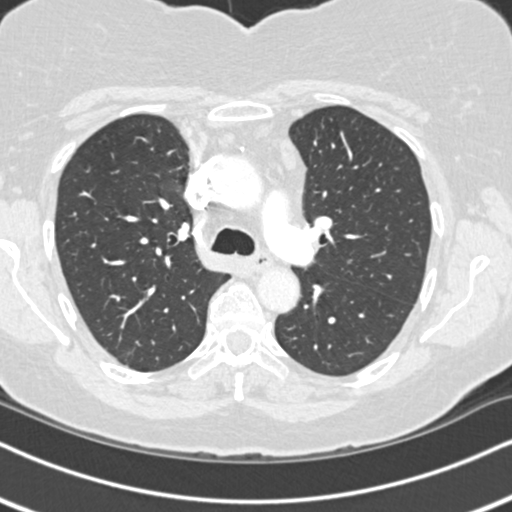
[im 207/276  mediastinal]
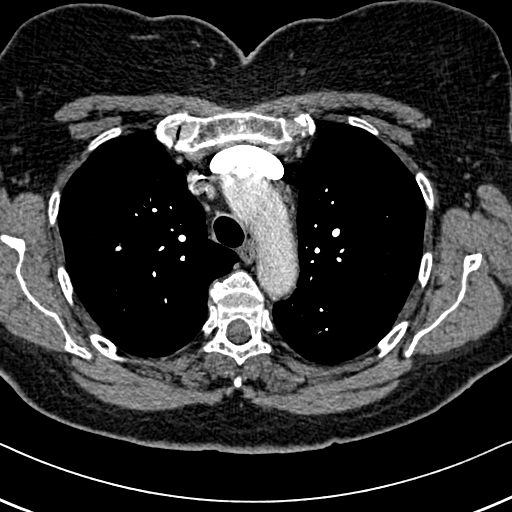
[im 224/276  lung]
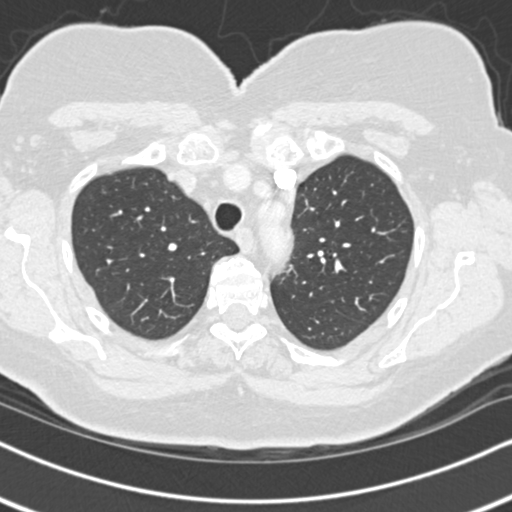
[im 241/276  mediastinal]
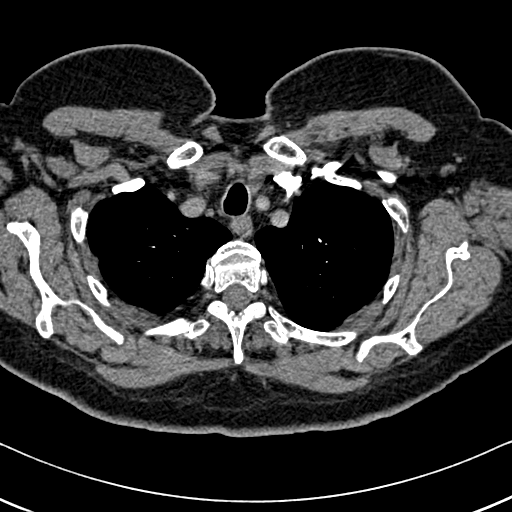
[im 258/276  lung]
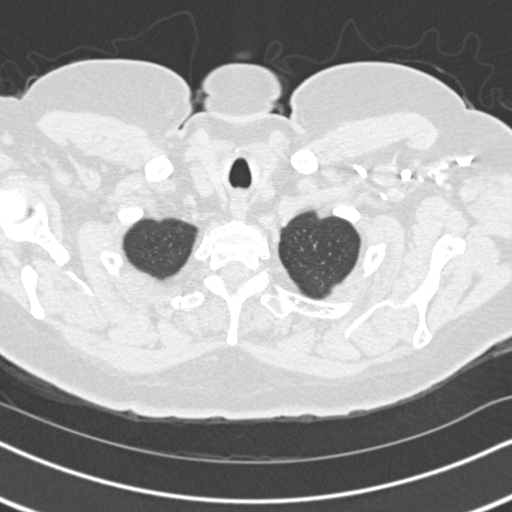

[Series 9: pulm embolism 2.0 spo thins · coronal · 0.61mm/px · 3 of 118 slices shown]
[im 24/118  mediastinal]
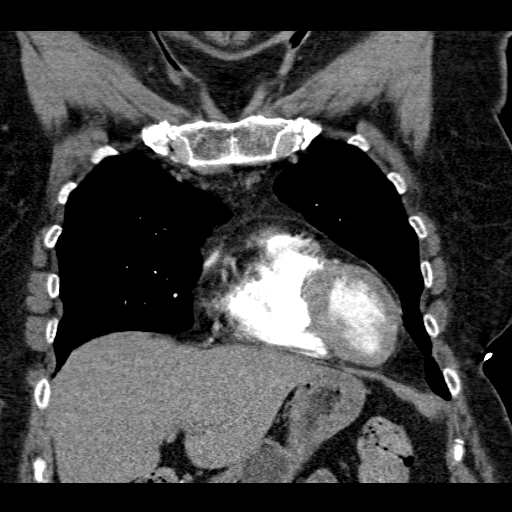
[im 47/118  mediastinal]
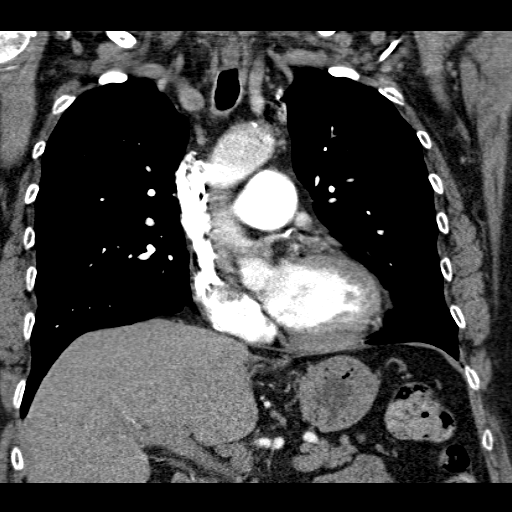
[im 71/118  mediastinal]
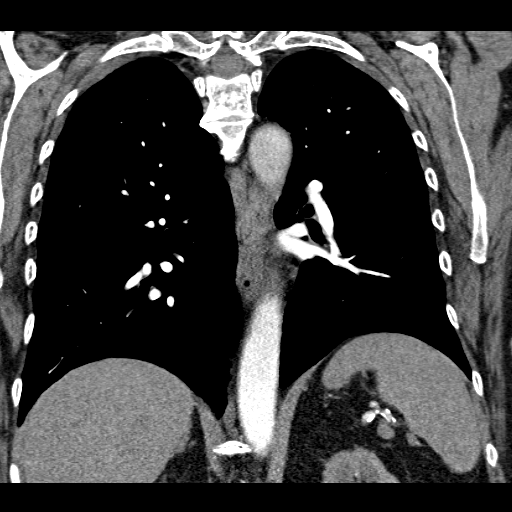

[18 of 36 positions shown; findings below may reference images not displayed]

FINDINGS: Pulmonary arteries are well opacified, and there is no
evidence of acute pulmonary embolism.  There is no evidence of
thoracic aortic aneurysm or dissection.  There is no evidence of
hilar or mediastinal masses, and no adenopathy is identified
elsewhere within the thorax.

There is no evidence of pleural or pericardial effusion.  Both
lungs are clear.  There is no evidence of pulmonary infiltrate or
mass.  The central tracheobronchial airways are patent.
IMPRESSION: Negative.  No evidence of acute pulmonary embolism or other active
disease.

## 2010-03-07 ENCOUNTER — Encounter: Payer: Self-pay | Admitting: Internal Medicine

## 2010-03-07 ENCOUNTER — Ambulatory Visit: Payer: Self-pay | Admitting: Family Medicine

## 2010-03-11 ENCOUNTER — Ambulatory Visit: Payer: Self-pay | Admitting: Internal Medicine

## 2010-03-11 ENCOUNTER — Telehealth (INDEPENDENT_AMBULATORY_CARE_PROVIDER_SITE_OTHER): Payer: Self-pay | Admitting: *Deleted

## 2010-03-14 ENCOUNTER — Telehealth (INDEPENDENT_AMBULATORY_CARE_PROVIDER_SITE_OTHER): Payer: Self-pay | Admitting: *Deleted

## 2010-03-29 ENCOUNTER — Telehealth (INDEPENDENT_AMBULATORY_CARE_PROVIDER_SITE_OTHER): Payer: Self-pay | Admitting: *Deleted

## 2010-04-09 ENCOUNTER — Telehealth: Payer: Self-pay | Admitting: Internal Medicine

## 2010-04-24 ENCOUNTER — Telehealth: Payer: Self-pay | Admitting: Internal Medicine

## 2010-04-29 ENCOUNTER — Ambulatory Visit: Payer: Self-pay | Admitting: Internal Medicine

## 2010-04-29 ENCOUNTER — Ambulatory Visit (HOSPITAL_BASED_OUTPATIENT_CLINIC_OR_DEPARTMENT_OTHER): Admission: RE | Admit: 2010-04-29 | Discharge: 2010-04-29 | Payer: Self-pay | Admitting: Internal Medicine

## 2010-04-29 ENCOUNTER — Ambulatory Visit: Payer: Self-pay | Admitting: Interventional Radiology

## 2010-04-29 ENCOUNTER — Telehealth: Payer: Self-pay | Admitting: Internal Medicine

## 2010-04-29 DIAGNOSIS — E1165 Type 2 diabetes mellitus with hyperglycemia: Secondary | ICD-10-CM | POA: Insufficient documentation

## 2010-04-29 DIAGNOSIS — E118 Type 2 diabetes mellitus with unspecified complications: Secondary | ICD-10-CM

## 2010-04-29 DIAGNOSIS — R0609 Other forms of dyspnea: Secondary | ICD-10-CM

## 2010-04-29 DIAGNOSIS — E785 Hyperlipidemia, unspecified: Secondary | ICD-10-CM

## 2010-04-29 DIAGNOSIS — G589 Mononeuropathy, unspecified: Secondary | ICD-10-CM | POA: Insufficient documentation

## 2010-04-29 DIAGNOSIS — R0989 Other specified symptoms and signs involving the circulatory and respiratory systems: Secondary | ICD-10-CM

## 2010-04-29 DIAGNOSIS — E041 Nontoxic single thyroid nodule: Secondary | ICD-10-CM

## 2010-04-29 DIAGNOSIS — R609 Edema, unspecified: Secondary | ICD-10-CM | POA: Insufficient documentation

## 2010-04-29 LAB — HM DIABETES FOOT EXAM

## 2010-04-30 ENCOUNTER — Encounter: Payer: Self-pay | Admitting: Internal Medicine

## 2010-05-01 ENCOUNTER — Ambulatory Visit: Payer: Self-pay | Admitting: Diagnostic Radiology

## 2010-05-01 ENCOUNTER — Telehealth: Payer: Self-pay | Admitting: Internal Medicine

## 2010-05-01 ENCOUNTER — Ambulatory Visit (HOSPITAL_BASED_OUTPATIENT_CLINIC_OR_DEPARTMENT_OTHER): Admission: RE | Admit: 2010-05-01 | Discharge: 2010-05-01 | Payer: Self-pay | Admitting: Surgical Oncology

## 2010-05-02 ENCOUNTER — Telehealth: Payer: Self-pay | Admitting: Family

## 2010-05-10 ENCOUNTER — Telehealth: Payer: Self-pay | Admitting: Internal Medicine

## 2010-05-13 ENCOUNTER — Ambulatory Visit: Payer: Self-pay | Admitting: Internal Medicine

## 2010-05-14 ENCOUNTER — Telehealth: Payer: Self-pay | Admitting: Internal Medicine

## 2010-06-17 ENCOUNTER — Ambulatory Visit (HOSPITAL_BASED_OUTPATIENT_CLINIC_OR_DEPARTMENT_OTHER): Admission: RE | Admit: 2010-06-17 | Discharge: 2010-06-17 | Payer: Self-pay | Admitting: Internal Medicine

## 2010-06-17 ENCOUNTER — Encounter: Payer: Self-pay | Admitting: Pulmonary Disease

## 2010-06-25 ENCOUNTER — Ambulatory Visit: Payer: Self-pay | Admitting: Pulmonary Disease

## 2010-06-28 ENCOUNTER — Telehealth: Payer: Self-pay | Admitting: Internal Medicine

## 2010-07-03 ENCOUNTER — Ambulatory Visit: Payer: Self-pay | Admitting: Family

## 2010-07-03 DIAGNOSIS — N390 Urinary tract infection, site not specified: Secondary | ICD-10-CM | POA: Insufficient documentation

## 2010-07-04 ENCOUNTER — Telehealth: Payer: Self-pay | Admitting: Internal Medicine

## 2010-07-04 DIAGNOSIS — G4733 Obstructive sleep apnea (adult) (pediatric): Secondary | ICD-10-CM

## 2010-07-05 ENCOUNTER — Telehealth: Payer: Self-pay | Admitting: Family

## 2010-07-30 ENCOUNTER — Ambulatory Visit: Payer: Self-pay | Admitting: Pulmonary Disease

## 2010-07-30 ENCOUNTER — Telehealth: Payer: Self-pay | Admitting: Internal Medicine

## 2010-07-30 LAB — HM PAP SMEAR: HM Pap smear: NORMAL

## 2010-08-13 ENCOUNTER — Encounter: Admission: RE | Admit: 2010-08-13 | Discharge: 2010-08-13 | Payer: Self-pay | Admitting: Internal Medicine

## 2010-08-15 ENCOUNTER — Ambulatory Visit: Payer: Self-pay | Admitting: Internal Medicine

## 2010-08-15 DIAGNOSIS — L538 Other specified erythematous conditions: Secondary | ICD-10-CM | POA: Insufficient documentation

## 2010-08-15 DIAGNOSIS — R109 Unspecified abdominal pain: Secondary | ICD-10-CM

## 2010-08-16 ENCOUNTER — Ambulatory Visit (HOSPITAL_BASED_OUTPATIENT_CLINIC_OR_DEPARTMENT_OTHER): Admission: RE | Admit: 2010-08-16 | Discharge: 2010-08-16 | Payer: Self-pay | Admitting: Internal Medicine

## 2010-08-16 ENCOUNTER — Telehealth: Payer: Self-pay | Admitting: Internal Medicine

## 2010-08-16 ENCOUNTER — Ambulatory Visit: Payer: Self-pay | Admitting: Radiology

## 2010-08-16 ENCOUNTER — Encounter: Payer: Self-pay | Admitting: Internal Medicine

## 2010-08-16 LAB — CONVERTED CEMR LAB
Basophils Absolute: 0 10*3/uL (ref 0.0–0.1)
CO2: 26 meq/L (ref 19–32)
Calcium: 9.2 mg/dL (ref 8.4–10.5)
Creatinine, Ser: 0.74 mg/dL (ref 0.40–1.20)
Eosinophils Absolute: 0.1 10*3/uL (ref 0.0–0.7)
Eosinophils Relative: 2 % (ref 0–5)
Glucose, Bld: 158 mg/dL — ABNORMAL HIGH (ref 70–99)
HCT: 35.9 % — ABNORMAL LOW (ref 36.0–46.0)
Hemoglobin: 12 g/dL (ref 12.0–15.0)
Lymphocytes Relative: 34 % (ref 12–46)
MCHC: 33.4 g/dL (ref 30.0–36.0)
MCV: 80.1 fL (ref 78.0–100.0)
Microalb Creat Ratio: 6.4 mg/g (ref 0.0–30.0)
Monocytes Absolute: 0.5 10*3/uL (ref 0.1–1.0)
Platelets: 201 10*3/uL (ref 150–400)
RDW: 14 % (ref 11.5–15.5)
Sodium: 139 meq/L (ref 135–145)
TSH: 1.541 microintl units/mL (ref 0.350–4.500)

## 2010-08-26 ENCOUNTER — Ambulatory Visit: Payer: Self-pay | Admitting: Internal Medicine

## 2010-09-02 ENCOUNTER — Ambulatory Visit: Payer: Self-pay | Admitting: Internal Medicine

## 2010-09-03 ENCOUNTER — Telehealth (INDEPENDENT_AMBULATORY_CARE_PROVIDER_SITE_OTHER): Payer: Self-pay | Admitting: *Deleted

## 2010-09-04 ENCOUNTER — Encounter: Payer: Self-pay | Admitting: Internal Medicine

## 2010-09-18 ENCOUNTER — Telehealth: Payer: Self-pay | Admitting: Internal Medicine

## 2010-10-07 ENCOUNTER — Telehealth: Payer: Self-pay | Admitting: Internal Medicine

## 2010-10-14 ENCOUNTER — Ambulatory Visit: Payer: Self-pay | Admitting: Internal Medicine

## 2010-10-14 DIAGNOSIS — H109 Unspecified conjunctivitis: Secondary | ICD-10-CM | POA: Insufficient documentation

## 2010-10-14 DIAGNOSIS — J018 Other acute sinusitis: Secondary | ICD-10-CM

## 2010-11-01 ENCOUNTER — Ambulatory Visit: Payer: Self-pay | Admitting: Internal Medicine

## 2010-11-06 ENCOUNTER — Ambulatory Visit: Payer: Self-pay | Admitting: Internal Medicine

## 2010-11-07 DIAGNOSIS — H60399 Other infective otitis externa, unspecified ear: Secondary | ICD-10-CM | POA: Insufficient documentation

## 2010-11-07 DIAGNOSIS — L293 Anogenital pruritus, unspecified: Secondary | ICD-10-CM | POA: Insufficient documentation

## 2010-11-12 ENCOUNTER — Ambulatory Visit: Payer: Self-pay | Admitting: Internal Medicine

## 2010-11-28 ENCOUNTER — Ambulatory Visit: Admit: 2010-11-28 | Payer: Self-pay | Admitting: Internal Medicine

## 2010-11-28 DIAGNOSIS — Z0289 Encounter for other administrative examinations: Secondary | ICD-10-CM

## 2010-12-03 ENCOUNTER — Ambulatory Visit: Payer: Self-pay | Admitting: Internal Medicine

## 2010-12-05 ENCOUNTER — Ambulatory Visit
Admission: RE | Admit: 2010-12-05 | Discharge: 2010-12-05 | Payer: Self-pay | Source: Home / Self Care | Attending: Internal Medicine | Admitting: Internal Medicine

## 2010-12-08 ENCOUNTER — Encounter: Payer: Self-pay | Admitting: Internal Medicine

## 2010-12-10 ENCOUNTER — Encounter: Payer: Self-pay | Admitting: Pulmonary Disease

## 2010-12-15 LAB — CONVERTED CEMR LAB
BUN: 16 mg/dL
BUN: 16 mg/dL
CO2, serum: 26 mmol/L
CO2, serum: 28 mmol/L
Calcium: 9.5 mg/dL
Chloride, Serum: 101 mmol/L
Chloride, Serum: 102 mmol/L
Cholesterol: 198 mg/dL
Creatinine, Ser: 0.75 mg/dL
Glucose, Bld: 278 mg/dL
Glucose, Bld: 406 mg/dL
HDL: 32 mg/dL
Hemoglobin: 12.4 g/dL
MCH: 32.1 pg
RBC count: 4.53 10*6/uL
Sodium, serum: 136 mmol/L
TSH: 1.621 microintl units/mL
Total Bilirubin: 0.5 mg/dL
Total Protein: 6.6 g/dL
Total Protein: 6.9 g/dL
Triglyceride fasting, serum: 207 mg/dL
Vit D, 25-Hydroxy: 43 ng/mL

## 2010-12-16 ENCOUNTER — Telehealth: Payer: Self-pay | Admitting: Internal Medicine

## 2010-12-17 NOTE — Progress Notes (Signed)
Summary: Phone-  Phone Note Call from Patient Call back at Home Phone [phone]   Caller: Patient Summary of Call: Patient requested that Dr. Drue Novel call Dr [name] regarding her inner ear. Please advise Initial call taken by: Barb Merino,  January 04, 2010 12:09 PM  Follow-up for Phone Call        Spoke with pt who says someone from our office called her.Informed pt no one from our office has called her, Our referral coordinator has a ENT referral out to Dr [name] office and perhaps they may have called her, recommend pt to check with dr [name] office.Kandice Hams  January 04, 2010 12:32 PM  Follow-up by: Kandice Hams,  January 04, 2010 12:32 PM

## 2010-12-17 NOTE — Assessment & Plan Note (Signed)
Summary: rash is spreading/mhf   Vital Signs:  Patient profile:   70 year old female Weight:      200.50 pounds BMI:     30.60 O2 Sat:      100 % on Room air Temp:     98.0 degrees F oral Pulse rate:   62 / minute Pulse rhythm:   regular Resp:     18 per minute BP sitting:   150 / 70  (right arm) Cuff size:   large  Vitals Entered By: Glendell Docker CMA (September 02, 2010 10:59 AM)  O2 Flow:  Room air CC: Rash Is Patient Diabetic? Yes Pain Assessment Patient in pain? no      Comments c/o rash has spread throughout body and irritation is the same, she has 2 days remaining on antibiotics, she has questions about Januvia-she declines to take medication-she can not afford    Primary Care Provider:  [name] DO  CC:  Rash.  History of Present Illness: 70 y/o white female prev seen for rash for f/u no improvement with nystatin powder or abx  DM II - [facility] Friendly is cost prohibitive.  loose stools much better since stopping metformin    Preventive Screening-Counseling & Management  Alcohol-Tobacco     Smoking Status: never  Allergies: 1)  ! 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 panel, f/u by Hematology 10-09 had CP, stress test  neg, likely GI (GERD) 09-2006, had CP: stress test  (-)   Family History: MI--no DM-- M, siblings Colon ca--no breast ca--no         Social History: Single-widow 3 children , 1 in [place] (son, daughter - [place])  [place] to [place] from [place], [place] Alcohol Use - no    tobacco-- never    Illicit Drug Use - no  Pt is Jehovah's Witness-No blood products Daughter - Mylie Mccurley   Physical Exam  General:  alert, well-developed, and well-nourished.   Neck:  No deformities, masses, or tenderness noted.no carotid bruits.   Lungs:  Normal respiratory effort, chest expands symmetrically. Lungs are clear to auscultation, no crackles or wheezes. Heart:  Normal rate and regular rhythm. S1 and S2 normal without gallop, murmur, click, rub or other extra sounds. Skin:  large intertriginous rash underneath right breast and left breast Psych:  normally interactive and good eye contact.     Impression & Recommendations:  Problem # 1:  INTERTRIGO, CANDIDAL (ICD-695.89) Assessment Deteriorated no improvement.  start oral fluconazole refer to derm for for further eval and tx Orders: Dermatology Referral (Derma)  Problem # 2:  DIABETES MELLITUS, TYPE II, UNCONTROLLED (ICD-250.02) Assessment: Unchanged  The following medications were removed from the medication list:    Januvia 100 Mg Tabs (Sitagliptin phosphate) Her updated medication list for this problem includes:    Lisinopril 40 Mg Tabs (Lisinopril) .Marland Kitchen... 1 by mouth qd    Lantus Solostar 100 Unit/ml Soln (Insulin glargine) .Marland KitchenMarland KitchenMarland KitchenMarland Kitchen 30 units to 40 units  two times a day ad directed    Novolog Flexpen 100 Unit/ml Soln (Insulin aspart) .Marland Kitchen... 25-30 units three times a day before meals  Labs Reviewed: Creat: 0.74 (08/16/2010)    Reviewed HgBA1c results: 7.9 (08/16/2010)  8.4 (12/21/2009)  Complete Medication List: 1)  Lisinopril 40 Mg Tabs (Lisinopril) .Marland Kitchen.. 1 by mouth qd 2)  Lantus Solostar 100 Unit/ml Soln (Insulin glargine) .... 30 units to 40 units  two times a day ad directed 3)  Novolog Flexpen 100 Unit/ml Soln (Insulin aspart) .... 25-30 units three times a day before meals 4)  Freestyle Test Strp (Glucose blood) .... Checks bs 4x/day dx 250.00 5)  Glucosamine-chondroitin 1500-1200 Mg/14ml Liqd (Glucosamine-chondroitin) ....  Take 1 tablet by mouth two times a day 6)  Lite Touch 0.5cc 29g E  .... As directed 7)  Vitamin D 3 Liquid  .... One drop at bedtime under tongue 8)  Antivert 12.5 Mg Tabs (Meclizine hcl) .... One by mouth every 6 hours as needed for dizziness 9)  Odorless Garlic 500 Mg Tabs (Garlic) .... Take 1 capsule by mouth two times a day 10)  Cyclobenzaprine Hcl 5 Mg Tabs (Cyclobenzaprine hcl) .Marland Kitchen.. 1-2 tabs two times a day as needed for pain.  may cause drowsiness 11)  Hydrochlorothiazide 12.5 Mg Caps (Hydrochlorothiazide) .... One by mouth once daily 12)  Pen Needles 1/2" 29g X 12mm Misc (Insulin pen needle) .... Use three times a day as directed 13)  Relion Pen Needles 31g X 8 Mm Misc (Insulin pen needle) .... Use as directed 14)  Pedi-dri 100000 Unit/gm Powd (Nystatin) .... Use two times a day 15)  Hyoscyamine Sulfate 0.125 Mg Tabs (Hyoscyamine sulfate) .... One by mouth three times a day as needed abdominal pain 16)  Fluconazole 100 Mg Tabs (Fluconazole) .... One by mouth once daily  Patient Instructions: 1)  Stop taking antibiotic 2)  Stop ciclopirox 3)  Our office will contact you re:  referral to new dermatologist 4)  Please schedule a follow-up appointment in 2 months. Prescriptions: LANTUS SOLOSTAR 100 UNIT/ML SOLN (INSULIN GLARGINE) 30 units to 40 units  two times a day ad directed  #1 month x 2   Entered and Authorized by:   [name] DO   Signed by:   [name] DO on 09/02/2010   Method used:   Electronically to        [facility] (retail)       [address]       [place], [place]  [address]       Ph: [phone]       Fax: [phone]   RxID:   [id number] FLUCONAZOLE 100 MG TABS (FLUCONAZOLE) one by mouth once daily  #30 x 0   Entered and Authorized by:   [name] DO   Signed by:   [name] DO on 09/02/2010   Method used:   Electronically to        [facility] (retail)       [address]       [place], [place]  [address]       Ph: [phone]       Fax: [phone]   RxID:   [id number]    Orders Added: 1)  Dermatology Referral [Derma] 2)  Est. Patient Level III [phone]  Current Allergies (reviewed today): ! * ACTOS GLUCOPHAGE

## 2010-12-17 NOTE — Progress Notes (Signed)
  Phone Note Outgoing Call   Call placed by: Doristine Devoid,  December 25, 2009 4:53 PM Call placed to: Patient Summary of Call: spoke w/ patient aware of CT results informed that she will need referral to ENT says she sees specialist already Dr. [name] and says he mentioned doing surgery but need ok from pcp informed patient that I will call to get last office visit from Dr. [name] and have Dr. Drue Novel review has also been seeing Dr. Courtney Heys

## 2010-12-17 NOTE — Consult Note (Signed)
Summary: [facility]  [facility]   Imported By: Lanelle Bal 09/23/2010 12:47:04  _____________________________________________________________________  External Attachment:    Type:   Image     Comment:   External Document

## 2010-12-17 NOTE — Assessment & Plan Note (Signed)
Summary: EAR INFECTION/[facility]   Vital Signs:  Patient profile:   70 year old female Weight:      198.2 pounds Temp:     98.5 degrees F oral Pulse rate:   64 / minute BP sitting:   160 / 80  (left arm)  Vitals Entered By: Doristine Devoid (December 25, 2009 12:48 PM) CC: both ears painful and feels full also feels unbalanced  CBG Result 290   History of Present Illness:  not feeling well for several days states that when she stands up she feels like "she is going to  fall and she needs to hold on to things". At rest, she does not have that feeling C/O "my  head doesn't feel right",  "weird feeling in my head" moving her head or standing doesn't seem to affect her much  wonders if the symptoms are due to an ear infection   Allergies: 1)  ! * Actos 2)  ! Glucophage 3)  ! * Lantus  Past History:  Past Medical History: Reviewed history from 11/19/2009 and no changes required. 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 panel, f/u by Hematology 10-09 had CP, stress test  neg, likely GI (GERD) 09-2006, had CP: stress test  (-)   Past Surgical History: Reviewed history from 01/01/2009 and no changes required. Cholecystectomy Left breast biopsy Left breast lumpectomy-ductal carcinoma in situ Appendectomy  Review of Systems       denies fever, sore throat, runny nose no headaches has a neck pain going on for months, unchanged denies any slurred speech or motor deficits denies any actual falls or head injury  Physical Exam  General:  alert and well-developed.   Neck:  normal carotid upstroke.   Lungs:  normal respiratory effort, no  intercostal retractions, no accessory muscle use, and normal breath sounds.   Heart:  normal rate, regular rhythm, no murmur, and no gallop.   Neurologic:  alert, oriented x 3 cooperative, coherent external  ocular movements are intact pupils are equal and react  reflexes are symmetric gait: She walks very slow and is trying not to move her head ( not sure if it is due to neck pain).  she did feel  unbalanced doing the  Romberg test     Impression & Recommendations:  Problem # 1:  DIZZINESS (ICD-780.4) on chart review, she also presented with dizziness last month; symptoms today however are different from 11-2008 she does not have carotid artery disease by MRA 12-10 also had a negative brain MRA 12/10 base  on her symptoms I cannot  definitely say if the dizziness is peripheral  or central with certainty: will get a CT of the head,  the rationale behind my recommendation was extensively discussed w/  the patient   neurology versus ENT evaluation depending on results   Her updated medication list for this problem includes:    Antivert 12.5 Mg Tabs (Meclizine hcl) ..... One by mouth every 6 hours as needed for dizziness  Orders: Radiology Referral (Radiology)  Problem # 2:  DIABETES MELLITUS, TYPE I (ICD-250.01) CBG today  very  high rec to talk w/ he endocrinologist   Her updated medication list for this problem includes:    Lisinopril 40 Mg Tabs (Lisinopril) .Marland Kitchen... 1 by mouth qd    Lantus 100 Unit/ml Soln (Insulin glargine) .Marland KitchenMarland KitchenMarland KitchenMarland Kitchen 40 units at bedtime    Novolog 100 Unit/ml Soln (Insulin aspart) ..... Sliding scale three times a day  Problem # 3:  ADDENDUM CT neg, will let patient know about results rec antivert, rest, fluids,  ENT referal   Complete Medication List: 1)  Lisinopril 40 Mg Tabs (Lisinopril) .Marland Kitchen.. 1 by mouth qd 2)  Lantus 100 Unit/ml Soln (Insulin glargine) .... 40 units at bedtime 3)  Novolog 100 Unit/ml Soln (Insulin aspart) .... Sliding scale three times a  day 4)  Oscal 500/200 D-3 500-200 Mg-unit Tabs (Calcium-vitamin d) 5)  Fish Oil Concentrate 120-180 Mg Caps (Omega-3 fatty acids) 6)  Freestyle Test Strp (Glucose blood) .... Checks bs 4x/day 7)  Glucosamine-chondroitin 1500-1200 Mg/92ml Liqd (Glucosamine-chondroitin) 8)  Multivitamin/iron Tabs (Multiple vitamins-iron) 9)  Lite Touch 0.5cc 29g E  .... As directed 10)  Asa 325mg   .... 1 by mouth once daily 11)  Vitamin D 3 Liquid  12)  Ciclopirox 8 % Soln (Ciclopirox) .... Apply to nail fungus 13)  Hydrocortisone 2.5 % Crea (Hydrocortisone) .... Apply two times a day x 5 days 14)  Ibuprofen 800 Mg Tabs (Ibuprofen) .Marland Kitchen.. 1 by mouth three times a day 15)  Antivert 12.5 Mg Tabs (Meclizine hcl) .... One by mouth every 6 hours as needed for dizziness Prescriptions: ANTIVERT 12.5 MG TABS (MECLIZINE HCL) one by mouth every 6 hours as needed for dizziness  #30 x 0   Entered by:   Doristine Devoid   Authorized by:   Nolon Rod. Damarys Speir MD   Signed by:   Doristine Devoid on 12/25/2009   Method used:   Electronically to        [facility] (retail)       [address]       [place], [place]  [address]       Ph: [phone]       Fax: [phone]   RxID:   [id number]   Laboratory Results   Blood Tests     CBG Random:: 290mg /dL   CBC   HGB:  84.1 g/dL   (Normal Range: 32.4-40.1 in Males, 12.0-15.0 in Females)

## 2010-12-17 NOTE — Assessment & Plan Note (Signed)
Summary: NEW TO EST OK PER PHONE NOTE FROM DR [name]/[facility]--Rm 2   Vital Signs:  Patient profile:   70 year old female Height:      68 inches Weight:      197.25 pounds BMI:     30.10 Temp:     97.9 degrees F oral Pulse rate:   78 / minute Pulse rhythm:   regular Resp:     16 per minute BP sitting:   152 / 70  (right arm) Cuff size:   regular  Vitals Entered By: [name] CMA (April 29, 2010 11:33 AM) CC: Room 2   New to establish care. Lives closer to the [facility] office.  Needs refill on Novolog and Freestyle lite test strip--will need dx code written on script.  Would like these rx's sent to [facility]. Is Patient Diabetic? Yes  Does patient need assistance? Functional Status Self care Comments Pt is also taking Calcium + D 600mg /400mg  2 once daily .  Meclizine 12.5mg  every 6 hours as needed.   Primary Care Provider:  [name] DO  CC:  Room 2   New to establish care. Lives closer to the [facility] office.  Needs refill on Novolog and Freestyle lite test strip--will need dx code written on script.  Would like these rx's sent to [facility].Marland Kitchen  History of Present Illness: 70 y/o white female ([place]) to establish  she has hx of DM II.  tx'ed by endo - Dr. Ocie Cornfield.  Dr. Roanna Raider left her practice. she reports labile blood sugars.  she has at least 5 episodes of hypoglycemia per month she uses lantus at night and sliding scale novolog three times a day.   using insulin syringe and injecting 4 x daily (cumbersome) she doesn't understand why blood sugar is normal at bedtime but higher in the AM  she eats regular meals.  diet reviewed. since her husband passed away - does not cook dinner.  usually eats lean cuisine for dinner  Hx of right LE DVT.  she has noticed more LE swelling L > R.  attributed to CCB. amlodipine changed for felodipine. liberal salt intake according to daugther  hx of chronic neck pain.  prev med records reviewed  she c/o  noticed small lump base of neck.  sometimes more noticeable  Allergies: 1)  ! 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 panel, f/u by Hematology 10-09 had CP, stress test  neg, likely GI (GERD) 09-2006, had CP: stress test  (-)   Past Surgical History: Cholecystectomy Left breast biopsy  Left breast lumpectomy-ductal carcinoma in situ Appendectomy  Family History: MI--no DM-- M, siblings Colon ca--no breast ca--no     Social History: Single-widow 3 children , 1 in [place] (son, daughter - [place]) [place] to [place] 2002 from [place], [place] Alcohol Use - no   tobacco-- never  Illicit Drug Use - no Pt is Jehovah's Witness-No blood products Daughter - Regene Mccarthy  Review of Systems  The patient denies fever, chest pain, syncope, dyspnea on exertion, prolonged cough, abdominal pain, melena, and depression.         she has hx of chronic neck pain.  she c/o small bruises on arms.  not related to trauma All other systems were reviewed and were negative.   Physical Exam  General:  alert, well-developed, and well-nourished.   Head:  normocephalic and atraumatic.   Ears:  R ear normal and L ear normal.   Mouth:  pharynx pink and moist, pharyngeal crowding, and low-lying uvula.   Neck:  No deformities, masses, or tenderness noted.no carotid bruits.   Lungs:  normal respiratory effort and normal breath sounds.   Heart:  normal rate, regular rhythm, and no gallop.   Abdomen:  soft, non-tender, normal bowel sounds, no hepatomegaly, and no splenomegaly.     Extremities:  1+ left pedal edema and trace right pedal edema.   Neurologic:  cranial nerves II-XII intact and gait normal.  diminished sensation to vibration of both feet  Skin:  2-3 scattered 1-2 cm bruising on arms Psych:  normally interactive, good eye contact, not anxious appearing, and not depressed appearing.    Diabetes Management Exam:    Foot Exam (with socks and/or shoes not present):       Sensory-Monofilament:          Left foot: diminished          Right foot: diminished   Impression & Recommendations:  Problem # 1:  DIABETES MELLITUS, TYPE II, UNCONTROLLED (ICD-250.02) Pt having freq hypoglycemia.  DC novolog SS.  decrease lantus to 25 units.  only use 5 units before breakfast and dinner.    prev GI side effect from metformin.  restart at lower dose and slowly titrate.  try to reduce overall insulin usage pen teaching provided  Her updated medication list for this problem includes:    Lisinopril 40 Mg Tabs (Lisinopril) .Marland Kitchen... 1 by mouth qd    Lantus Solostar 100 Unit/ml Soln (Insulin glargine) .Marland Kitchen... 25 units once daily at bedtime    Novolog Flexpen 100 Unit/ml Soln (Insulin aspart) .Marland KitchenMarland KitchenMarland KitchenMarland Kitchen 5 units before breakfast and dinner    Aspirin 81 Mg Tbec (Aspirin) .Marland Kitchen... Take 1 tablet by mouth once a day    Metformin Hcl 500 Mg Tabs (Metformin hcl) .Marland Kitchen... 1/2 by mouth two times a day x 2 weeks, then one by mouth two times a day with food  Labs Reviewed: Creat: 0.7 (01/11/2010)    Reviewed HgBA1c results: 8.4 (12/21/2009)  8.3 (04/17/2009)  Problem # 2:  SNORING (ICD-786.09) Daughter reports loud snoring and daytime somnolence.  OSA may be aggravating diabetes control.  arrange sleep study Her updated medication list for this problem includes:    Lisinopril 40 Mg Tabs (Lisinopril) .Marland Kitchen... 1 by mouth qd    Hydrochlorothiazide 12.5 Mg Caps (Hydrochlorothiazide) ..... One by mouth once daily  Orders: Sleep Disorder Referral (Sleep Disorder)  Problem # 3:  THYROID NODULE  (ICD-241.0)  Pt reports lower thyroid swelling.  review of med records shows prev CT of neck 01/21/2010 noted calcificatoin in the right thyroid lobe. 1 cm nodule palpable left sternal notch.  arrange thyroid u/s  Orders: Radiology Referral (Radiology)  Problem # 4:  EDEMA (ICD-782.3) Left > right lower ext swelling.   she has hx of DVT.  rule out DVT. I urged low salt diet.  consider use of compression stockings.  stop CCB  Her updated medication list for this problem includes:  Hydrochlorothiazide 12.5 Mg Caps (Hydrochlorothiazide) ..... One by mouth once daily  Orders: LE Venous Duplex (DVT) (DVT)  Problem # 5:  NEUROPATHY (ICD-355.9) Pt with diminished vibratory sense of lower ext.  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  we discussed importance of examining feet daily. rule out other etiology  Problem # 6:  HYPERTENSION (ICD-401.9) suboptimally controlled.  dc CCB due to edema.  add hctz.  The following medications were removed from the medication list:    Felodipine 5 Mg Xr24h-tab (Felodipine) .Marland Kitchen... 1 by mouth once daily Her updated medication list for this problem includes:    Lisinopril 40 Mg Tabs (Lisinopril) .Marland Kitchen... 1 by mouth qd    Hydrochlorothiazide 12.5 Mg Caps (Hydrochlorothiazide) ..... One by mouth once daily  BP today: 152/70 Prior BP: 160/64 (03/11/2010)  Labs Reviewed: K+: 4.7 (01/11/2010) Creat: : 0.7 (01/11/2010)   Chol: 198 (12/21/2009)   HDL: 32 (12/21/2009)   LDL: 125 (12/21/2009)   TG: 207 (12/21/2009)  Problem # 7:  HYPERLIPIDEMIA (ICD-272.4) she has hx of atherosclerosis  of  carotid arteries.  she has been relucant  to start statin for fear of side effects.  start pravastatin  Her updated medication list for this problem includes:    Pravastatin Sodium 10 Mg Tabs (Pravastatin sodium) ..... One by mouth qpm  Complete Medication List: 1)  Lisinopril 40 Mg Tabs (Lisinopril) .Marland Kitchen.. 1 by mouth qd 2)  Lantus Solostar 100 Unit/ml Soln (Insulin glargine) .... 25  units once daily at bedtime 3)  Novolog Flexpen 100 Unit/ml Soln (Insulin aspart) .... 5 units before breakfast and dinner 4)  Omega-3-6-9 Caps (Omega 3-6-9 fatty acids) .... Take 1 capsule by mouth once a day 5)  Freestyle Test Strp (Glucose blood) .... Checks bs 4x/day dx 250.00 6)  Glucosamine-chondroitin 1500-1200 Mg/77ml Liqd (Glucosamine-chondroitin) .... Take 1 tablet by mouth two times a day 7)  Multivitamin/iron Tabs (Multiple vitamins-iron) 8)  Lite Touch 0.5cc 29g E  .... As directed 9)  Aspirin 81 Mg Tbec (Aspirin) .... Take 1 tablet by mouth once a day 10)  Vitamin D 3 Liquid  .... One drop at bedtime under tongue 11)  Ciclopirox 8 % Soln (Ciclopirox) .... Apply to nail fungus 12)  Antivert 12.5 Mg Tabs (Meclizine hcl) .... One by mouth every 6 hours as needed for dizziness 13)  Odorless Garlic 500 Mg Tabs (Garlic) .... Take 1 capsule by mouth two times a day 14)  Cyclobenzaprine Hcl 5 Mg Tabs (Cyclobenzaprine hcl) .Marland Kitchen.. 1-2 tabs two times a day as needed for pain.  may cause drowsiness 15)  Hydrochlorothiazide 12.5 Mg Caps (Hydrochlorothiazide) .... One by mouth once daily 16)  Pravastatin Sodium 10 Mg Tabs (Pravastatin sodium) .... One by mouth qpm 17)  Metformin Hcl 500 Mg Tabs (Metformin hcl) .... 1/2 by mouth two times a day x 2 weeks, then one by mouth two times a day with food 18)  Pen Needles 1/2" 29g X 12mm Misc (Insulin pen needle) .... Use three times a day as directed  Patient Instructions: 1)  Use lamisil cream two times a day x 2 weeks 2)  Please schedule a follow-up appointment in 1 month. 3)  BMP prior to visit, ICD-9:  401.9 4)  Hepatic Panel prior to visit, ICD-9: 272.4 5)  Lipid Panel prior to visit, ICD-9: 272.4 6)  TSH prior to visit, ICD-9: 272.4 7)  HbgA1C prior to visit, ICD-9: 250.02 8)  Urine Microalbumin prior to visit, ICD-9: 250.02 9)  Please return for lab work  one (1) week before your next appointment.  Prescriptions: PEN NEEDLES 1/2" 29G X  MISC (INSULIN PEN NEEDLE) use three times a day as directed  #100 x 3   Entered and Authorized by:   [name] DO   Signed by:   [name] DO on 04/29/2010   Method used:   Electronically to        [facility] [address] [address] (retail)       [address]       [place], [place]  [address]       Ph: [phone]       Fax: [phone]   RxID:   [id number] METFORMIN HCL 500 MG TABS (METFORMIN HCL) 1/2 by mouth two times a day x 2 weeks, then one by mouth two times a day with food  #60 x 1   Entered and Authorized by:   [name] DO   Signed by:   [name] DO on 04/29/2010   Method used:   Electronically to        [facility] (retail)       [address]       [place], [place]  [address]       Ph: [phone]       Fax: [phone]   RxID:   [id number] PRAVASTATIN SODIUM 10 MG TABS (PRAVASTATIN SODIUM) one by mouth qpm  #30 x 5   Entered and Authorized by:   [name] DO   Signed by:   [name] DO on 04/29/2010   Method used:   Electronically to        [facility] (retail)       [address]       [place], [place]  [address]       Ph: [phone]       Fax: [phone]   RxID:   [id number] HYDROCHLOROTHIAZIDE 12.5 MG CAPS (HYDROCHLOROTHIAZIDE) one by mouth once daily  #30 x 2   Entered and Authorized by:   [name] DO   Signed by:   [name] DO on 04/29/2010   Method used:   Electronically to        [facility] (retail)       [address]       [place], [place]  [address]       Ph: [phone]       Fax: [phone]   RxID:   [id number] FREESTYLE TEST   STRP (GLUCOSE BLOOD) checks BS 4x/day dx 250.00  #300 x 3   Entered and Authorized by:   [name] DO   Signed by:   [name] DO on 04/29/2010   Method used:   Electronically to        [facility] [address] (retail)       [address]       [place], [place]  [address]       Ph: [phone]       Fax: [phone]   RxID:   [id number] NOVOLOG FLEXPEN 100 UNIT/ML SOLN (INSULIN ASPART)  5 units before breakfast and dinner  #1 month x 5   Entered and Authorized by:   [name] DO   Signed by:   [name] DO on 04/29/2010   Method used:   Electronically to        [facility] (retail)       [address]       [place], [place]  [address]       Ph: [phone]       Fax: [phone]   RxID:   [id number] LANTUS SOLOSTAR 100 UNIT/ML SOLN (INSULIN GLARGINE) 25 units once daily at bedtime  #1 month x 5   Entered and Authorized by:   [name] DO   Signed by:   [name] DO on 04/29/2010   Method used:   Electronically to        [facility] (retail)       [address]       [place], [place]  [address]       Ph: [phone]       Fax: [phone]   RxID:   [id number] PEN NEEDLES 1/2" 29G X MISC (INSULIN PEN NEEDLE) use three times a day as directed  #100 x 3   Entered and Authorized by:   [name] DO   Signed by:   [name] DO on 04/29/2010   Method used:   Electronically to        [facility] # [phone]* (retail)       [address]       [place], [place]  [address]       Ph: [phone]       Fax: [phone]   RxID:   [id number] NOVOLOG FLEXPEN 100 UNIT/ML SOLN (INSULIN ASPART) 5 units before breakfast and dinner  #1 month x 3   Entered and Authorized by:   [name] DO   Signed by:   [name] DO on 04/29/2010   Method used:   Electronically to        [facility] # [phone]* (retail)       [address]       [place], [place]  [address]       Ph: [phone]       Fax: [phone]   RxID:   [id number] LANTUS SOLOSTAR 100  UNIT/ML SOLN (INSULIN GLARGINE) 25 units once daily at bedtime  #1 month x 3   Entered and Authorized by:   [name] DO   Signed by:   [name] DO on 04/29/2010   Method used:   Electronically to        [facility] # [phone]* (retail)       [address]       [place], [place]  [address]       Ph: [phone]       Fax: [phone]   RxID:   [id number] METFORMIN HCL 500 MG TABS (METFORMIN HCL) 1/2 by mouth two times a day x 2 weeks, then one by mouth two times a day  with food  #60 x 1   Entered and Authorized by:   [name] DO   Signed by:   [name] DO on 04/29/2010   Method used:   Electronically to        [facility] # [phone]* (retail)       [address]       [place], [place]  [address]       Ph: [phone]       Fax: [phone]   RxID:   [id number] PRAVASTATIN SODIUM 10 MG TABS (PRAVASTATIN SODIUM) one by mouth qpm  #30 x 5   Entered and Authorized by:   [name] DO   Signed by:   [name] DO on 04/29/2010   Method used:   Electronically to        [facility] # [phone]* (retail)       [address]       [place], [place]  [address]       Ph: [phone]       Fax: [phone]   RxID:   [id number] HYDROCHLOROTHIAZIDE 12.5 MG CAPS (HYDROCHLOROTHIAZIDE) one by mouth once daily  #30 x 2   Entered and Authorized by:   [name] DO   Signed by:   [name] DO on 04/29/2010   Method used:   Electronically to        [facility] # [phone]* (retail)       [address]       [place], [place]  [address]       Ph: [phone]       Fax: [phone]   RxID:   [id number]

## 2010-12-17 NOTE — Progress Notes (Signed)
Summary: Elevated Blood Sugar  Phone Note Call from Patient Call back at Home Phone [phone]   Caller: Patient Summary of Call: patient callled and left voice message stating she is out of Insulin and would like to know if she could get samples of Novolog and Lantus. Her message states that he blood sugars have been above 300's.  attempted to contact patient at [phone], no answer. Voice message left for patient to return call.  Call placed to patient at [phone], and she states her blood sugar this morning was  297, she took 16 units,and then her blood sugar was  260 at lunch she used 15 units. She states she has a couple of doses left and will run out. She was informed that a rx was sent to the pharmacy for her Insulin. She states she is aware, but states that she can not afford the Inuslin that has been prescribed to her. Patient was advised to schedule follow up with Dr Artist Pais. She was given a appointment for Monday 6/27 @ 2:30p     Initial call taken by: Glendell Docker CMA,  May 10, 2010 5:28 PM

## 2010-12-17 NOTE — Progress Notes (Signed)
Summary: refill  Phone Note Refill Request Message from:  Fax from Pharmacy on December 31, 2009 11:01 AM  Refills Requested: Medication #1:  LISINOPRIL 40 MG  TABS 1 by mouth QD [facility] [address] fax [phone] patient would like a 90 day if possible   Method Requested: Fax to Local Pharmacy Next Appointment Scheduled: 03/21/2010 Initial call taken by: Barb Merino,  December 31, 2009 11:02 AM    Prescriptions: LISINOPRIL 40 MG  TABS (LISINOPRIL) 1 by mouth QD  #90 x 1   Entered by:   Shary Decamp   Authorized by:   Nolon Rod. Paz MD   Signed by:   Shary Decamp on 12/31/2009   Method used:   Electronically to        [facility] [address] (retail)       [address]       [place], [place]  [address]       Ph: [phone]       Fax: [phone]   RxID:   [id number]

## 2010-12-17 NOTE — Progress Notes (Signed)
Summary: CT results   Phone Note Outgoing Call   Summary of Call: call pt - CT of abd and pelvis unremarkable Initial call taken by: [name] DO,  August 16, 2010 4:11 PM  Follow-up for Phone Call        call placed to patient at [phone], she has been informed per Dr Artist Pais instructions Follow-up by: Glendell Docker CMA,  August 16, 2010 4:45 PM

## 2010-12-17 NOTE — Assessment & Plan Note (Signed)
Summary: uti/mhf   Vital Signs:  Patient profile:   70 year old female Weight:      197.75 pounds BMI:     30.18 O2 Sat:      99 % on Room air Temp:     98.4 degrees F oral Pulse rate:   80 / minute Pulse rhythm:   regular Resp:     16 per minute BP sitting:   142 / 70  (right arm) Cuff size:   large  Vitals Entered By: Glendell Docker CMA (July 03, 2010 1:21 PM)  O2 Flow:  Room air CC: Urinary Pain Is Patient Diabetic? No Pain Assessment Patient in pain? no      Comments c/o  pain with urination for the past  week, tried cranberry juice and Azo over the counter with no improvement. Urine is Orange unable to perform dipstick.  Taking Metformin but does not know the  dosage   Primary Care [place]:  [name] DO  CC:  Urinary Pain.  History of Present Illness: Ms Coss is a 70 year old female who presents today with complaint of dysuria frequency x 1 week.  Denies fever, denies unusual back pain, denies nausea or vomitting.  Denies hematuria.  Took Azo without relief. Notes that her blood sugars have been well controlled, generally in the low 100's.  Occasional sugar in the 200.    Allergies: 1)  ! 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 panel, f/u by Hematology 10-09 had CP, stress test  neg, likely GI (GERD) 09-2006, had CP: stress test  (-)   Past Surgical History: Last updated: 05/13/2010 Cholecystectomy Left breast biopsy  Left breast lumpectomy-ductal carcinoma in situ Appendectomy    Family  History: Last updated: 05/13/2010 MI--no DM-- M, siblings Colon ca--no breast ca--no      Social History: Last updated: 05/13/2010 Single-widow 3 children , 1 in [place] (son, daughter - [place]) [place] to [place] 2002 from [place], [place] Alcohol Use - no    tobacco-- never  Illicit Drug Use - no Pt is Jehovah's Witness-No blood products Daughter - Teriah Muela  Risk Factors: Smoking Status: never (10/10/2008)  Review of Systems       see HPI  Physical Exam  General:  Well-developed,well-nourished,in no acute distress; alert,appropriate and cooperative throughout examination Lungs:  Normal respiratory effort, chest expands symmetrically. Lungs are clear to auscultation, no crackles or wheezes. Heart:  Normal rate and regular rhythm. S1 and S2 normal without gallop, murmur, click, rub or other extra sounds. Abdomen:  Mild suprapubic tenderness to palpation.  + BS, soft Msk:  Neg CVAT   Impression & Recommendations:  Problem # 1:  UTI (ICD-599.0) Assessment New Treat empiricaly with Cipro. Will send urine for culture.  UA not performed due to use of AZO.  Pt instructed to f/u as noted on pt instructions Her updated medication list for this problem includes:    Cipro 500 Mg Tabs (Ciprofloxacin hcl) ..... One tablet  by mouth two times a day x 7 days  Complete Medication List: 1)  Lisinopril 40 Mg Tabs (Lisinopril) .Marland Kitchen.. 1 by mouth qd 2)  Lantus Solostar 100 Unit/ml Soln (Insulin glargine) .... 30 - 50 units once daily at bedtime 3)  Novolog Flexpen 100 Unit/ml Soln (Insulin aspart) .... 25-30 units three times a day before meals 4)  Omega-3-6-9 Caps (Omega 3-6-9 fatty acids) .... Take 1 capsule by mouth once a day 5)  Freestyle Test Strp (Glucose blood) .... Checks bs 4x/day dx 250.00 6)  Glucosamine-chondroitin 1500-1200 Mg/41ml Liqd (Glucosamine-chondroitin) .... Take 1 tablet by mouth two times a day 7)  Multivitamin/iron Tabs (Multiple vitamins-iron) 8)  Lite Touch 0.5cc  29g E  .... As directed 9)  Aspirin 81 Mg Tbec (Aspirin) .... Take 1 tablet by mouth once a day 10)  Vitamin D 3 Liquid  .... One drop at bedtime under tongue 11)  Ciclopirox 8 % Soln (Ciclopirox) .... Apply to nail fungus 12)  Antivert 12.5 Mg Tabs (Meclizine hcl) .... One by mouth every 6 hours as needed for dizziness 13)  Odorless Garlic 500 Mg Tabs (Garlic) .... Take 1 capsule by mouth two times a day 14)  Cyclobenzaprine Hcl 5 Mg Tabs (Cyclobenzaprine hcl) .Marland Kitchen.. 1-2 tabs two times a day as needed for pain.  may cause drowsiness 15)  Hydrochlorothiazide 12.5 Mg Caps (Hydrochlorothiazide) .... One by mouth once daily 16)  Pravastatin Sodium 10 Mg Tabs (Pravastatin sodium) .... One by mouth qpm 17)  Pen Needles 1/2" 29g X 12mm Misc (Insulin pen needle) .... Use three times a day as directed 18)  Relion Pen Needles 31g X 8 Mm Misc (Insulin pen needle) .... Use as directed 19)  Cipro 500 Mg Tabs (Ciprofloxacin hcl) .... One tablet by mouth two times a day x 7 days  Patient Instructions: 1)  Call if you develop low back pain, fever, blood in urine, if your symptoms worsen or if they do not improve.  Prescriptions: LANTUS SOLOSTAR 100 UNIT/ML SOLN (INSULIN GLARGINE) 30 - 50 units once daily at bedtime  #1 month x 2   Entered and Authorized by:   Lemont Fillers FNP   Signed by:   Lemont Fillers FNP on 07/03/2010   Method used:   Samples Given   RxID:   [id number] NOVOLOG FLEXPEN 100 UNIT/ML SOLN (INSULIN ASPART) 25-30 units three times a day before meals  #1 month x 0   Entered and Authorized by:   Lemont Fillers FNP   Signed by:   Lemont Fillers FNP on 07/03/2010   Method used:   Samples Given   RxID:   [id number] CIPRO 500 MG TABS (CIPROFLOXACIN HCL) one tablet by mouth two times a day x 7 days  #14 x 0   Entered and Authorized by:   Lemont Fillers FNP   Signed by:   Lemont Fillers FNP on 07/03/2010   Method used:   Electronically to         [facility] [address] [address] (retail)       [address]       [place], [place]  [address]       Ph: [phone]       Fax: [phone]   RxID:   [id number]   Current Allergies: ! * ACTOS GLUCOPHAGE  Appended Document: Orders Update    Clinical Lists Changes  Orders: Added new Test  order of T-Culture, Urine ([id number]) - Signed Added new Service order of Specimen Handling ([id number]) - Signed

## 2010-12-17 NOTE — Progress Notes (Signed)
Summary: Refill Request  Phone Note Refill Request Call back at [phone] Message from:  Pharmacy on Mar 29, 2010 12:32 PM  Refills Requested: Medication #1:  LISINOPRIL 40 MG  TABS 1 by mouth QD   Dosage confirmed as above?Dosage Confirmed   Supply Requested: 1 month   Last Refilled: 03/29/2010 Karin Golden on [address]   Next Appointment Scheduled: none Initial call taken by: Harold Barban,  Mar 29, 2010 12:32 PM    Prescriptions: LISINOPRIL 40 MG  TABS (LISINOPRIL) 1 by mouth QD  #30 x 6   Entered by:   Shary Decamp   Authorized by:   Nolon Rod. Paz MD   Signed by:   Shary Decamp on 03/29/2010   Method used:   Electronically to        [facility] (retail)       [address]       [place], [place]  [address]       Ph: [phone]       Fax: [phone]   RxID:   [id number]

## 2010-12-17 NOTE — Progress Notes (Signed)
Summary: waiting on cpap pillow  Phone Note Call from Patient Call back at Home Phone [phone]   Caller: Patient Call For: alva Summary of Call: pt says [facility] was to call her re: cpap pillow (pt can't use a mask). no one has called her yet.  Initial call taken by: Tivis Ringer, CNA,  September 03, 2010 12:41 PM  Follow-up for Phone Call        will call pt back after 2:00/spoke to katie at [facility] and she will call this pt today also had to refax the order  Follow-up by: Oneita Jolly,  September 03, 2010 12:54 PM

## 2010-12-17 NOTE — Letter (Signed)
Summary: Generic Letter  [place] [facility]  [address]   [place], [place] [address]   Phone: [phone]  Fax: [phone]    01/14/2010  March Rummage [facility] [address] [place], [place] [address]  Dear March Rummage PA-C,  Please see my attached office note on [name] DOB October 31, 2041.  You saw her in consultation 2/4 for her Diabetes.  She is planning an upcoming surgical procedure with ENT to repair a perforated TM.  Our labwork indicates A1C 8.4.  Would you please let us know if you are comfortable clearing her for this procedure from a diabetes standpoint?  Sincerely,   Sandford Craze FNP  Appended Document: Generic Letter Letter and 2/25 office note faxed to [phone].

## 2010-12-17 NOTE — Assessment & Plan Note (Signed)
Summary: followup from hosp/alr pt coming at 10:30   Vital Signs:  Patient profile:   70 year old female Height:      68 inches Weight:      196.2 pounds BMI:     29.94 Pulse rate:   76 / minute BP sitting:   142 / 80  Vitals Entered By: Shary Decamp (November 19, 2009 10:34 AM) CC: HOSP F/U Is Patient Diabetic? Yes Comments  - FBS THIS AM WAS 144  - patient states that she feels worse now then when she was in the hospital  - still dizzy Shary Decamp  November 19, 2009 10:46 AM    History of Present Illness:  DATE OF ADMISSION:  11/05/2009   DATE OF DISCHARGE:  11/07/2009       DISCHARGE DIAGNOSES:   1. Chest pain - noncardiac - cardiac catheterization was within normal  limits.  LV Fx normal   2. Chronic neck pain of unclear etiology - workup in progress -  erythrocyte sedimentation rate also known as ESR is mildly elevated  - unclear why.  The patient may benefit from outpatient  rheumatological consultation.    chart reviewed   CT angiogram negative for PE. Sed rate 35 potassium 3.9, creatinine 0.7, glucose 306. CBC showed a white count of 7.9, hemoglobin 11.5, platelets 147. Total cholesterol 168, LDL 97, HDL 31  also on November 12 2009 had a MRA of the head, neck (Ordered by neurology) at the time of the actual MRI her  neck was hyper extended, when she stood up after the procedure she felt extremely dizzy.  She is still dizzy to some extent.  Also has nasal congestion, ear ache, mild cough  and just not feeling well.  Current Medications (verified): 1)  Lisinopril 40 Mg  Tabs (Lisinopril) .Marland Kitchen.. 1 By Mouth Qd 2)  Lantus 100 Unit/ml  Soln (Insulin Glargine) .... 40 Units At Bedtime 3)  Novolog 100 Unit/ml  Soln (Insulin Aspart) .... Sliding Scale Three Times A Day 4)  Oscal 500/200 D-3 500-200 Mg-Unit  Tabs (Calcium-Vitamin D) 5)  Fish Oil Concentrate 120-180 Mg  Caps (Omega-3 Fatty Acids) 6)  Freestyle Test   Strp (Glucose Blood) .... Checks Bs 4x/day 7)   Glucosamine-Chondroitin 1500-1200 Mg/17ml  Liqd (Glucosamine-Chondroitin) 8)  Multivitamin/iron   Tabs (Multiple Vitamins-Iron) 9)  Lite Touch 0.5cc 29g E .... As Directed 10)  Asa 325mg  .... 1 By Mouth Once Daily 11)  Vitamin D 3 Liquid 12)  Ciclopirox 8 % Soln (Ciclopirox) .... Apply To Nail Fungus 13)  Hydrocortisone 2.5 % Crea (Hydrocortisone) .... Apply Two Times A Day X 5 Days 14)  Ibuprofen 800 Mg Tabs (Ibuprofen) .Marland Kitchen.. 1 By Mouth Three Times A Day  Allergies (verified): 1)  ! * Actos 2)  ! Glucophage 3)  ! 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 panel, f/u by Hematology 10-09 had CP, stress test  neg, likely GI (GERD) 09-2006, had CP: stress test  (-)   Past Surgical History: Reviewed history from 01/01/2009 and no  changes required. Cholecystectomy Left breast biopsy Left breast lumpectomy-ductal carcinoma in situ Appendectomy  Social History: Reviewed history from 05/04/2009 and no changes required. Single-widow 3 children , 1 in [place] Alcohol Use - no tobacco-- never  Illicit Drug Use - no Pt is Jehovah's Witness-No blood products  Review of Systems       since she left the hospital.  She had no further chest pain. Dealing with neck pain since September, this has not changed. She was told hospital, that she may have PMR, however, her sed rate was 35. CBGs are better, see nurse's notes.  Physical Exam  General:  alert and well-developed.   Ears:  R ear normal and L ear normal.   Nose:  not congested Lungs:  normal respiratory effort, no intercostal retractions, no  accessory muscle use, and normal breath sounds.   Heart:  normal rate, regular rhythm, no murmur, and no gallop.   Extremities:  no edema Neurologic:  alert, oriented x 3. Speech, gait, and motor intact   Impression & Recommendations:  Problem # 1:  OTHER SYMPTOMS INVOLVING CARDIOVASCULAR SYSTEM (ICD-785.9) chest pain, resolved.  cath  negative. Continue aspirin 325 mg (tolerates well)  Problem # 2:  HYPERTENSION (ICD-401.9)  no change for now, recent BMP normal Hg at the hospital slightly  low: recheck  Her updated medication list for this problem includes:    Lisinopril 40 Mg Tabs (Lisinopril) .Marland Kitchen... 1 by mouth qd  BP today: 142/80 Prior BP: 170/60 (09/20/2009)  Labs Reviewed: K+: 4.8 (09/13/2009) Creat: : 0.75 (09/13/2009)   Chol: 174 (05/17/2009)   HDL: 32.40 (05/17/2009)   LDL: 122 (05/17/2009)   TG: 97.0 (05/17/2009)  Orders: Venipuncture ([address]) TLB-CBC Platelet - w/Differential (85025-CBCD)  Problem # 3:  DIABETES MELLITUS, TYPE I (ICD-250.01) to meet with a new endocrinology's on November 29, 2009 Her updated medication list for this problem includes:    Lisinopril 40 Mg Tabs (Lisinopril) .Marland Kitchen... 1 by mouth qd    Lantus 100 Unit/ml Soln (Insulin glargine) .Marland KitchenMarland KitchenMarland KitchenMarland Kitchen 40 units at bedtime    Novolog 100 Unit/ml Soln (Insulin aspart) ..... Sliding scale three times a day  Problem # 4:  NECK PAIN (ICD-723.1) neck pain since September, follow-up  w/ neurology sed rate 35, doubt PMR, this was discussed with the patient Her updated medication list for this problem includes:    Ibuprofen 800 Mg Tabs (Ibuprofen) .Marland Kitchen... 1 by mouth three times a day  Problem # 5:  DIZZINESS (ICD-780.4) symptoms started after MRI when she had to hyperextend her neck. Trial with Antivert. Call if not better  Her updated medication list for this problem includes:    Antivert 12.5 Mg Tabs (Meclizine hcl) ..... One by mouth every 6 hours as needed for dizziness  Problem # 6:  time spent >> 25 min   d/t chart review and multiple pt concerns about her recent admission  Complete Medication List: 1)  Lisinopril 40 Mg Tabs (Lisinopril) .Marland Kitchen.. 1 by mouth qd 2)  Lantus 100 Unit/ml Soln (Insulin glargine) .... 40 units at bedtime 3)  Novolog 100 Unit/ml Soln (Insulin aspart) .... Sliding scale three times a day 4)  Oscal 500/200 D-3 500-200 Mg-unit Tabs (Calcium-vitamin d) 5)  Fish Oil Concentrate 120-180 Mg Caps (Omega-3 fatty acids) 6)  Freestyle Test Strp (Glucose blood) .... Checks bs 4x/day 7)  Glucosamine-chondroitin 1500-1200 Mg/32ml Liqd (Glucosamine-chondroitin) 8)  Multivitamin/iron Tabs (Multiple vitamins-iron) 9)  Lite Touch 0.5cc 29g E  .... As directed 10)  Asa 325mg   .Marland KitchenMarland KitchenMarland Kitchen  1 by mouth once daily 11)  Vitamin D 3 Liquid  12)  Ciclopirox 8 % Soln (Ciclopirox) .... Apply to nail fungus 13)  Hydrocortisone 2.5 % Crea (Hydrocortisone) .... Apply two times a day x 5 days 14)  Ibuprofen 800 Mg Tabs (Ibuprofen) .Marland Kitchen.. 1 by mouth three times a day 15)  Antivert 12.5 Mg Tabs (Meclizine hcl) .... One by mouth every 6 hours as needed for dizziness  Patient Instructions: 1)  Please schedule a follow-up appointment in 4 months .  Prescriptions: ANTIVERT 12.5 MG TABS (MECLIZINE HCL) one by mouth every 6 hours as needed for dizziness  #30 x 0   Entered and Authorized by:   Nolon Rod. Elliemae Braman MD   Signed by:   Nolon Rod. Rico Massar MD on 11/19/2009   Method used:   Print then Give to Patient   RxID:   [id number]

## 2010-12-17 NOTE — Miscellaneous (Signed)
Summary: labs from [facility]   Clinical Lists Changes  Observations: Added new observation of PROTEIN, TOT: 6.6 g/dL (60/08/9322 55:73) Added new observation of ALBUMIN: 4.1 g/dL (22/12/5425 06:23) Added new observation of BILI TOTAL: 0.7 mg/dL (76/28/3151 76:16) Added new observation of ALK PHOS: 81 units/L (12/21/2009 15:20) Added new observation of CALCIUM: 9.5 mg/dL (07/37/1062 69:48) Added new observation of BG RANDOM: 406 mg/dL (54/62/7035 00:93) Added new observation of CREATININE: 0.8 mg/dL (81/82/9937 16:96) Added new observation of BUN: 16 mg/dL (78/93/8101 75:10) Added new observation of CO2 TOTAL: 28 mmol/L (12/21/2009 15:20) Added new observation of CHLORIDE: 101 mmol/L (12/21/2009 15:20) Added new observation of POTASSIUM: 4.8 mmol/L (12/21/2009 15:20) Added new observation of SODIUM: 136 mmol/L (12/21/2009 15:20) Added new observation of SGPT (ALT): 22 units/L (12/21/2009 15:20) Added new observation of SGOT (AST): 20 units/L (12/21/2009 15:20) Added new observation of HGBA1C: 8.4 % (12/21/2009 15:20) Added new observation of TRIGLYCERIDE: 207 mg/dL (25/85/2778 24:23) Added new observation of HDL: 32 mg/dL (53/61/4431 54:00) Added new observation of LDL: 125 mg/dL (86/76/1950 93:26) Added new observation of CHOLESTEROL: 198 mg/dL (71/24/5809 98:33) Added new observation of VIT D 25-OH: 43 ng/mL (12/21/2009 15:20)      Lab Entry Test Date: 12/21/2009                        Value        Units        H/L   Reference  Vit D. 25-OH:         43           ng/ml              (16-74)    Lipid Panel Test Date: 12/21/2009                        Value        Units        H/L   Reference  Cholesterol:          198          mg/dL              (825-053) LDL Cholesterol:      125          mg/dL              (97-673) HDL Cholesterol:      32           mg/dL              (41-93) Triglyceride:         207          mg/dL              (79-024) OXBD5H                 8.4                             SGOT (AST)            20                              SGPT (ALT)            22  Chemistry Labs Test Date: 12/21/2009                      Value Units        H/L   Reference  Sodium:             136   mmol/L        L    (137-145) Potassium:          4.8   mmol/L             (3.6-5.0) Chloride:           101   mmol/L             (101-111) CO2:                28    mmol/L             (22-31) BUN:                16    mg/dL              (1-61) Creatinine:         0.8   mg/dL              (0.9-6.0) Glucose-random:     406   mg/dL         H    (45-409) Calcium (total):    9.5   mg/dL              (8-11.9) Alkaline P'tase:    81    U/L                (10-120) T. Bili:            0.7   mg/dL              (1.4-7.8) Albumin:            4.1   g/dL               (3-5) Total Protein:      6.6   g/dL               (4-7)  Appended Document: labs from [facility] .apcholesterol is elevatedrec simvastatin 20mg  once daily  FLP AST ALT in 6 weeks  Appended Document: fyi - endo started on pravastatin NO ANSWER Shary Decamp  December 28, 2009 4:22 PM  discussed with pt -- endocrinologist has started pt on pravastatin 10mg  Shary Decamp  December 28, 2009 4:59 PM   Medications Added SIMVASTATIN 20 MG TABS (SIMVASTATIN) 1 by mouth at bedtime - DUE LABS IN 6 WEEKS PRAVASTATIN SODIUM 10 MG TABS (PRAVASTATIN SODIUM) 1 by mouth once daily (rx'd by endocrinologist)          Clinical Lists Changes  Medications: Added new medication of SIMVASTATIN 20 MG TABS (SIMVASTATIN) 1 by mouth at bedtime - DUE LABS IN 6 WEEKS - Signed Changed medication from SIMVASTATIN 20 MG TABS (SIMVASTATIN) 1 by mouth at bedtime - DUE LABS IN 6 WEEKS to PRAVASTATIN SODIUM 10 MG TABS (PRAVASTATIN SODIUM) 1 by mouth once daily (rx'd by endocrinologist) - Signed

## 2010-12-17 NOTE — Progress Notes (Signed)
Summary: needs an earlier appt   Phone Note Call from Patient Call back at Home Phone [phone]   Caller: Patient Call For: [name] Summary of Call: This patient  wants to change her pcp to Dr Artist Pais.  She is medicare.  the first available is now in August for a new Medicare patient.  She says her legs are swollen and they hurt.  She is a diabetic.  Can I make her an appt sooner to see Dr Artist Pais.  She has purple spots on the arm.   Initial call taken by: Roselle Locus,  April 24, 2010 9:19 AM  Follow-up for Phone Call        I suggests she see her current PCP for her leg complaints ASAP.  we can take 2 15 slots on 6/13 afternoon as new pt appt Follow-up by: [name] DO,  April 24, 2010 12:11 PM  Additional Follow-up for Phone Call Additional follow up Details #1::        patient advised per Dr Artist Pais instructions, patient  refused to be seen by current primary care provider, and she will accept to appointment offered for 6/13 Additional Follow-up by: Glendell Docker CMA,  April 24, 2010 1:30 PM

## 2010-12-17 NOTE — Progress Notes (Signed)
Summary: Novolog Samples  Phone Note Call from Patient Call back at Home Phone [phone]   Caller: Patient Call For: [name] DO Summary of Call: Patient called and left voice message requesting samples of Novolog vials. She states she is unable to fill her rx at the pharmacy and she will be leaving to go out of town on Wednesday.  call was returned to patient at [phone] she was advised Novolog vials- samples would be left at front desk for patient pick up. Initial call taken by: Glendell Docker CMA,  October 07, 2010 1:53 PM    New/Updated Medications: NOVOLOG 100 UNIT/ML SOLN (INSULIN ASPART)  Prescriptions: NOVOLOG 100 UNIT/ML SOLN (INSULIN ASPART)   #2 x 0   Entered by:   Glendell Docker CMA   Authorized by:   [name] DO   Signed by:   Glendell Docker CMA on 10/07/2010   Method used:   Samples Given   RxID:   [id number]

## 2010-12-17 NOTE — Assessment & Plan Note (Signed)
Summary: fu on bp/kdc   Vital Signs:  Patient profile:   70 year old female Height:      68 inches Weight:      195 pounds BMI:     29.76 Pulse rate:   84 / minute BP sitting:   160 / 64  Vitals Entered By: Shary Decamp (March 11, 2010 10:21 AM) CC: f/u on bp   History of Present Illness: here for a follow-up her blood pressure was quite elevated last week, she was started on  amlodipine she is tolerating it well, I asked her about edema and  if anything it may be slightly more than usual.  Current Medications (verified): 1)  Lisinopril 40 Mg  Tabs (Lisinopril) .Marland Kitchen.. 1 By Mouth Qd 2)  Lantus 100 Unit/ml  Soln (Insulin Glargine) .... 40 Units At Bedtime 3)  Novolog 100 Unit/ml  Soln (Insulin Aspart) .... Sliding Scale Three Times A Day 4)  Omega-3-6-9  Caps (Omega 3-6-9 Fatty Acids) .... Take 1 Capsule By Mouth Once A Day 5)  Freestyle Test   Strp (Glucose Blood) .... Checks Bs 4x/day Dx 250.00 6)  Glucosamine-Chondroitin 1500-1200 Mg/38ml  Liqd (Glucosamine-Chondroitin) .... Take 1 Tablet By Mouth Two Times A Day 7)  Multivitamin/iron   Tabs (Multiple Vitamins-Iron) 8)  Lite Touch 0.5cc 29g E .... As Directed 9)  Aspirin 81 Mg Tbec (Aspirin) .... Take 1 Tablet By Mouth Once A Day 10)  Vitamin D 3 Liquid .... One Drop At Bedtime Under Tongue 11)  Ciclopirox 8 % Soln (Ciclopirox) .... Apply To Nail Fungus 12)  Ibuprofen 800 Mg Tabs (Ibuprofen) .Marland Kitchen.. 1 By Mouth Three Times A Day 13)  Antivert 12.5 Mg Tabs (Meclizine Hcl) .... One By Mouth Every 6 Hours As Needed For Dizziness 14)  Odorless Garlic 500 Mg Tabs (Garlic) .... Take 1 Capsule By Mouth Two Times A Day 15)  Amlodipine Besylate 10 Mg  Tabs (Amlodipine Besylate) .... Once Daily 16)  Cyclobenzaprine Hcl 5 Mg Tabs (Cyclobenzaprine Hcl) .Marland Kitchen.. 1-2 Tabs Two Times A Day As Needed For Pain.  May Cause Drowsiness  Allergies (verified): 1)  ! * Actos 2)  ! Glucophage  Past History:  Past Medical History: Reviewed history from  11/19/2009 and no changes required. 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 panel, f/u by Hematology 10-09 had CP, stress test  neg, likely GI (GERD) 09-2006, had CP: stress test  (-)   Social History: Reviewed history from 05/04/2009 and no changes required. Single-widow 3 children , 1 in [place] Alcohol Use - no tobacco-- never  Illicit Drug Use - no Pt is Jehovah's Witness-No blood products  Review of Systems        she c/o  a knot in the chest, has noted that for months, no pain, no redness   Physical Exam  General:  alert, well-developed, and well-nourished.   Neck:  no masses, no thyromegaly, and no cervical lymphadenopathy.   Chest Wall:  I palpated the area  of pt's concern carefully, it coincide with the inner tip of the left clavicle. No mass per se Extremities:  trace bilateral ankle edema   Impression & Recommendations:  Problem # 1:  HYPERTENSION (ICD-401.9) recently started on amlodipine, tolerating well so far I discussed the patient the BP goals, and gave her information about low salt diet  seen instructions  Her updated medication list for this problem includes:    Lisinopril 40 Mg Tabs (Lisinopril) .Marland Kitchen... 1 by mouth qd    Amlodipine Besylate 10 Mg Tabs (Amlodipine besylate) ..... Once daily  BP today: 160/64 Prior BP: 194/90 (03/07/2010)  Labs Reviewed: K+: 4.7 (01/11/2010) Creat: : 0.7 (01/11/2010)   Chol: 198 (12/21/2009)   HDL: 32 (12/21/2009)   LDL: 125 (12/21/2009)   TG: 207 (12/21/2009)  Problem # 2:  mass in the chest area of patient's concern is the inner tip of the left clavicle advised patient to  observe, call if the area grows or change  Problem # 3:  PREOPERATIVE EXAMINATION (ICD-V72.84) the patient is concerned because she has not been scheduled for surgery, she has been clear previously by [place] Advised patient to contact ENT  to schedule her surgery  Complete Medication List: 1)  Lisinopril 40 Mg Tabs (Lisinopril) .Marland Kitchen.. 1 by mouth qd 2)  Lantus 100 Unit/ml Soln (Insulin glargine) .... 40 units at bedtime 3)  Novolog 100 Unit/ml Soln (Insulin aspart) .... Sliding scale three times a day 4)  Omega-3-6-9 Caps (Omega 3-6-9 fatty acids) .... Take 1 capsule by mouth once a day 5)  Freestyle Test Strp (Glucose blood) .... Checks bs 4x/day dx 250.00 6)  Glucosamine-chondroitin 1500-1200 Mg/6ml Liqd (Glucosamine-chondroitin) .... Take 1 tablet by mouth two times a day 7)  Multivitamin/iron Tabs (Multiple vitamins-iron) 8)  Lite Touch 0.5cc 29g E  .... As directed 9)  Aspirin 81 Mg Tbec (Aspirin) .... Take 1 tablet by mouth once a day 10)  Vitamin D 3 Liquid  .... One drop at bedtime under tongue 11)  Ciclopirox 8 % Soln (Ciclopirox) .... Apply to nail fungus 12)  Ibuprofen 800 Mg Tabs (Ibuprofen) .Marland Kitchen.. 1 by mouth three times a day 13)  Antivert 12.5 Mg Tabs (Meclizine hcl) .... One by mouth every 6 hours as needed for dizziness 14)  Odorless Garlic 500 Mg Tabs (Garlic) .... Take 1 capsule by mouth two times a day 15)  Amlodipine Besylate 10 Mg Tabs (Amlodipine besylate) .... Once daily 16)  Cyclobenzaprine Hcl 5 Mg Tabs (Cyclobenzaprine hcl) .Marland Kitchen.. 1-2 tabs two times a day as needed for pain.  may cause drowsiness  Patient Instructions: 1)  Check your blood pressure 2 or 3 times a week. If it is more than 140/85 consistently,please let us know 2)   Please schedule a follow-up appointment in 3 months .

## 2010-12-17 NOTE — Progress Notes (Signed)
Summary: still with ear pain  Phone Note Call from Patient Call back at Home Phone [phone]   Reason for Call: Privacy/Consent Authorization Summary of Call: still with ear pressure, dizziness, ear pain.  Refer back to Dr. Verne Spurr (ENT)? Shary Decamp  January 02, 2010 11:38 AM   Follow-up for Phone Call        yes Nolon Rod. Paz MD  January 02, 2010 12:48 PM   referral done [facility]  January 02, 2010 1:13 PM   New Problems: EAR PAIN (ICD-388.70)   New Problems: EAR PAIN (ICD-388.70)

## 2010-12-17 NOTE — Progress Notes (Signed)
Summary: surgical clearance, [facility] notes  Phone Note Call from Patient   Caller: Patient Summary of Call: pt called left msg wants a call back.Kandice Hams  January 22, 2010 11:05 AM  Initial call taken by: Kandice Hams,  January 22, 2010 11:05 AM  Follow-up for Phone Call        Pt returned my call. Notified pt. of need for CXR and PT/PTT per Dr. [name] for surgical clearance.  Pt. asked me to get records from her visit to [facility].  I called [phone] and requested CTA head & neck which they will mail to Dr. Drue Novel.  I spoke to [facility] in medical records and they will send all records for [place] to review.  I will call pt. after reviewing records and let her know if tests will still be needed. Follow-up by: [name] CMA,  January 22, 2010 3:37 PM  Additional Follow-up for Phone Call Additional follow up Details #1::        Spoke to radiology tech at [facility]. They verified that pt. did not have a CXR while in the hosp.  Spoke to pt. and advised her that we still need to get CXR at the [facility] office. She will need to pick up order for CXR then go to radiology. Pt. voices understanding.  Will need to call Dr. [name]' office at [phone] when clearance is complete. Additional Follow-up by: [name] CMA,  January 29, 2010 9:22 AM    Additional Follow-up for Phone Call Additional follow up Details #2::    Surgical Clearance letter faxed to Dr. [name]' office [phone]. Follow-up by: [name] CMA,  January 31, 2010 9:12 AM

## 2010-12-17 NOTE — Assessment & Plan Note (Addendum)
Summary: sleep apnea/ mbw   Primary Provider/Referring Provider:  [name] DO  CC:  Pt here for sleep consult.  History of Present Illness: 69/F, g[place] origin, diabetic, hypertensive never smoker for evlauaiton of obstructive sleep apnea. Daughter reports loud snoring & witnessed apneas for many years. Lately she has non refreshing sleep. Epworth Sleepiness Score 9. Bedtime 11 p, sleep latency minimal, 1-2 awakenings for nocturia, awakens at 0645 unrefreshed, no headaches or dryness of mouth. 2-3 cups coffee daily. There is no history suggestive of cataplexy, sleep paralysis or parasomnias  Overnight PSG 8/11 showed moderate obstructive sleep apnea with AHi 23 /h & low desaturation of 90%, CPAP was initiated & titrated to 11 cm with full face mask with good control of events  - poor tolerance.  Preventive Screening-Counseling & Management  Alcohol-Tobacco     Smoking Status: never   History of Present Illness: Snoring  What time do you typically go to bed?(between what hours): 11pm  How long does it take you to fall asleep? not too long  How many times during the night do you wake up? 1 or 2   What time do you get out of bed to start your day? 6:45am  Do you drive or operate heavy machinery in your occupation? No  How much has your weight changed (up or down) over the past two years? (in pounds): same  Have you ever had a sleep study before?  If yes,when and where: Yes month ago  Do you currently use CPAP ? If so , at what pressure? No  Do you wear oxygen at any time? If yes, how many liters per minute? No Current Medications (verified): 1)  Lisinopril 40 Mg  Tabs (Lisinopril) .Marland Kitchen.. 1 By Mouth Qd 2)  Lantus Solostar 100 Unit/ml Soln (Insulin Glargine) .... 30 - 50 Units Once Daily At Bedtime 3)  Novolog Flexpen 100 Unit/ml Soln (Insulin Aspart) .... 25-30 Units Three Times A Day Before Meals 4)  Omega-3-6-9  Caps (Omega 3-6-9 Fatty Acids) .... Take 1 Capsule By Mouth  Once A Day 5)  Freestyle Test   Strp (Glucose Blood) .... Checks Bs 4x/day Dx 250.00 6)  Glucosamine-Chondroitin 1500-1200 Mg/34ml  Liqd (Glucosamine-Chondroitin) .... Take 1 Tablet By Mouth Two Times A Day 7)  Multivitamins   Tabs (Multiple Vitamin) .... Take 1 Tablet By Mouth Once A Day 8)  Lite Touch 0.5cc 29g E .... As Directed 9)  Vitamin D 3 Liquid .... One Drop At Bedtime Under Tongue 10)  Antivert 12.5 Mg Tabs (Meclizine Hcl) .... One By Mouth Every 6 Hours As Needed For Dizziness 11)  Odorless Garlic 500 Mg Tabs (Garlic) .... Take 1 Capsule By Mouth Two Times A Day 12)  Cyclobenzaprine Hcl 5 Mg Tabs (Cyclobenzaprine Hcl) .Marland Kitchen.. 1-2 Tabs Two Times A Day As Needed For Pain.  May Cause Drowsiness 13)  Hydrochlorothiazide 12.5 Mg Caps (Hydrochlorothiazide) .... One By Mouth Once Daily 14)  Pen Needles 1/2" 29g X 12mm Misc (Insulin Pen Needle) .... Use Three Times A Day As Directed 15)  Relion Pen Needles 31g X 8 Mm Misc (Insulin Pen Needle) .... Use As Directed 16)  Metformin Hcl 500 Mg Tabs (Metformin Hcl) .... Take 1 Tablet By Mouth Once A Day  Allergies (verified): 1)  ! 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 panel, f/u by Hematology 10-09 had CP, stress test  neg, likely GI (GERD) 09-2006, had CP: stress test  (-)   Past Surgical History: Last updated: 05/13/2010 Cholecystectomy Left breast biopsy  Left breast lumpectomy-ductal carcinoma in situ Appendectomy    Family History: Last updated: 05/13/2010 MI--no DM-- M,  siblings Colon ca--no breast ca--no      Social History: Last updated: 05/13/2010 Single-widow 3 children , 1 in [place] (son, daughter - [place]) [place] to [place] 2002 from [place], [place] Alcohol Use - no    tobacco-- never  Illicit Drug Use - no Pt is Jehovah's Witness-No blood products Daughter - Lashay Osborne  Review of Systems       The patient complains of shortness of breath with activity, acid heartburn, indigestion, sneezing, hand/feet swelling, joint stiffness or pain, and rash.  The patient denies shortness of breath at rest, productive cough, non-productive cough, coughing up blood, chest pain, irregular heartbeats, loss of appetite, weight change, abdominal pain, difficulty swallowing, sore throat, tooth/dental problems, headaches, nasal congestion/difficulty breathing through nose, itching, ear ache, anxiety, depression, change in color of mucus, and fever.    Vital Signs:  Patient profile:   70 year old female Height:      68 inches Weight:      199 pounds BMI:     30.37 O2 Sat:      99 % on Room air Temp:     98.3 degrees F oral Pulse rate:   68 / minute BP sitting:   166 / 80  (left arm) Cuff size:   regular  Vitals Entered By: Zackery Barefoot CMA (July 30, 2010 9:01 AM)  O2 Flow:  Room air CC: Pt here for sleep consult Comments Medications reviewed with patient Verified contact number and pharmacy with patient Zackery Barefoot CMA  July 30, 2010 9:03 AM    Physical Exam  Additional Exam:  Gen. Pleasant, well-nourished, in no distress, normal affect ENT - no lesions, no post nasal drip class 2 airway Neck: No JVD, no thyromegaly, no carotid bruits Lungs: no use of accessory muscles, no dullness to percussion, clear without rales or rhonchi  Cardiovascular: Rhythm regular, heart sounds  normal, no murmurs or gallops, no peripheral edema Abdomen: soft and non-tender, no hepatosplenomegaly, BS normal. Musculoskeletal: No deformities, no cyanosis or  clubbing Neuro:  alert, non focal     Impression & Recommendations:  Problem # 1:  SLEEP APNEA, OBSTRUCTIVE (ICD-327.23) Moderate obstructive sleep apnea based on PSG & history. Given significant symptoms & cardiac risk factors, reasonable to pursue treatment with CPAP 11 cm - she p[refers nasal pillows to mask & humidity. The pathophysiology of obstructive sleep apnea, it's cardiovascular consequences and modes of treatment including CPAP were discussed with the patient in great detail.  Compliance encouraged, wt loss emphasized, asked to avoid meds with sedative side effects, cautioned against driving when sleepy.  Orders: Consultation Level III ([address]) DME Referral (DME)  Medications Added to Medication List This Visit: 1)  Multivitamins Tabs (Multiple vitamin) .... Take 1 tablet by mouth once a day 2)  Metformin Hcl 500 Mg Tabs (Metformin hcl) .... Take 1 tablet  by mouth once a day  Patient Instructions: 1)  Copy sent to: dr Artist Pais 2)  Please schedule a follow-up appointment in 1 month. 3)  We wil set you up with a CPAP machine 4)  Please send in t he chip from the machine in 4 weeks  Appended Document: sleep apnea/ mbw needs FU OV   Appended Document: sleep apnea/ mbw Pt was scheduled for 2/13 at 4pm, pt has can because she has turned in machine and supplies 12/02/10, because she "just didn't like it". Does not want to come back for follow up

## 2010-12-17 NOTE — Letter (Signed)
Summary: Surgical Clearance[facility]   Imported By: Lanelle Bal 01/21/2010 13:13:31  _____________________________________________________________________  External Attachment:    Type:   Image     Comment:   External Document

## 2010-12-17 NOTE — Miscellaneous (Signed)
Summary: lab order--bmp,hepfx,lipids,tsh,a1c,urine microalbumin,cbc  Clinical Lists Changes  Orders: Added new Test order of T-Basic Metabolic Panel [id number]) - Signed Added new Test order of T-Hepatic Function [phone]) - Signed Added new Test order of T-Lipid Profile [phone]) - Signed Added new Test order of T-TSH [id number]) - Signed Added new Test order of T- Hemoglobin A1C ([id number]) - Signed Added new Test order of T-Urine Microalbumin w/creat. ratio [id number]) - Signed Added new Test order of T-CBC w/Diff [phone]) - Signed

## 2010-12-17 NOTE — Letter (Signed)
Summary: [facility]   Imported By: Lanelle Bal 01/02/2010 13:18:33  _____________________________________________________________________  External Attachment:    Type:   Image     Comment:   External Document

## 2010-12-17 NOTE — Letter (Signed)
Summary: [facility]  [facility]   Imported By: Lanelle Bal 01/29/2010 10:19:22  _____________________________________________________________________  External Attachment:    Type:   Image     Comment:   External Document

## 2010-12-17 NOTE — Progress Notes (Signed)
Summary: letter for surgery  Phone Note Other Incoming Call back at [phone]   Caller: mary beth - Dr. [name] office Summary of Call: [name] left message on VM: requesting letter for surgical clearance.  She would like to know when it will be done so that she can schedule patient for surgery [name]  January 30, 2010 3:19 PM   Follow-up for Phone Call        letter completed. Follow-up by: Lemont Fillers FNP,  January 30, 2010 4:52 PM

## 2010-12-17 NOTE — Progress Notes (Signed)
  Phone Note Outgoing Call   Summary of Call: Pls fax copy of my office note and letter to March Rummage PA-c at [phone]. Thanks Initial call taken by: Lemont Fillers FNP,  January 14, 2010 8:57 AM  Follow-up for Phone Call        Letter and office note faxed to [place], [place] [phone]. Follow-up by: [name] CMA,  January 14, 2010 9:17 AM

## 2010-12-17 NOTE — Progress Notes (Signed)
Summary: Medication Side Effects  Phone Note Call from Patient Call back at Home Phone [phone]   Caller: Patient Call For: [name] DO Summary of Call: patient called and stated that since starting the fluconazole she has had  leg weakness-with difficulty standing up, and  severe fatigue, she states she has been feeling this way for the past week. She also c/o tingling in the bottom of her  feet , unbale to think straight or gather her thoughts. She states she wanted to make Dr Artist Pais aware that is not going to take the medication anymore, because she does not like the was it makes her feel.  Initial call taken by: Glendell Docker CMA,  September 18, 2010 11:32 AM  Follow-up for Phone Call        noted.  plz update her allergy list Follow-up by: [name] DO,  September 18, 2010 11:58 AM  Additional Follow-up for Phone Call Additional follow up Details #1::        Phone Call Completed, alergy list update Additional Follow-up by: Glendell Docker CMA,  September 18, 2010 1:39 PM   New Allergies: ! FLUCONAZOLE (FLUCONAZOLE) New Allergies: ! FLUCONAZOLE (FLUCONAZOLE)

## 2010-12-17 NOTE — Assessment & Plan Note (Signed)
Summary: elevated bp   Vital Signs:  Patient profile:   70 year old female Weight:      195 pounds Pulse rate:   74 / minute BP sitting:   194 / 90  (left arm)  Vitals Entered By: Doristine Devoid (March 07, 2010 2:04 PM) CC: elevated bp at chiropractor 225/90   History of Present Illness: 70 yo woman here today for elevated BP at chiropractor office.  also reports feeling of dizziness since Monday.  some intermittant visual changes- 'like a gray film'.  reports this has been going on since Sept.  pt reports that she feels like 'i'm falling all the time'.  no CP, SOB, N/V.  current sxs not relieved w/ antivert.  pt denies taking hydralazine two times a day despite being on the med list.  pt admits to a lot of stress.  has seen multiple specialists for these sxs since 9/10- she reports no one can help her and would like this 'fixed' today.  Allergies (verified): 1)  ! * Actos 2)  ! Glucophage  Past History:  Past Medical History: Last updated: 11/19/2009 h/o COLONIC POLYPS    CP 10-2009: cath negative  HYPERTENSION  DIABETES MELLITUS   OSTEOARTHRITIS   GERD  Osteopenia Lupus of skin-- sees dermatology frecuently  left ductal  Breast CA Dx 2003 aprox, s/p surgery, XRT x 6 weeks , released from oncology (per patient) OA C-spine R leg DVT 05-17-2008, to  d/c coumadin 11-09 L leg swelling 2-10, u/s showed a old clot , had a hypercoag. panel, f/u by Hematology 10-09 had CP, stress test  neg, likely GI (GERD) 09-2006, had CP: stress test  (-)   Review of Systems      See HPI  Physical Exam  General:  Well-developed,well-nourished,in no acute distress; alert,appropriate and cooperative throughout examination Head:  Normocephalic and atraumatic without obvious abnormalities. No apparent alopecia or balding. Eyes:  PERRL, fundi WNL Ears:  no infxn Nose:  not congested Neck:  No deformities, masses, or tenderness noted. Lungs:  Normal respiratory effort, chest expands  symmetrically. Lungs are clear to auscultation, no crackles or wheezes. Heart:  Normal rate and regular rhythm. S1 and S2 normal without gallop, murmur, click, rub or other extra sounds. Msk:  bilateral trapezius spasm, full ROM of neck Pulses:  +2 carotid, radial, DP Extremities:  No clubbing, cyanosis, edema, or deformity noted with normal full range of motion of all joints.   Neurologic:  No cranial nerve deficits noted. Station and gait are normal. Plantar reflexes are down-going bilaterally. DTRs are symmetrical throughout. Sensory, motor and coordinative functions appear intact. Psych:  anxious   Impression & Recommendations:  Problem # 1:  HYPERTENSION (ICD-401.9) Assessment Deteriorated BP elevated.  EKG unchanged, pt denies sxs.  pt feels sxs are related to stress of 'not feeling well since september'.  add Amlodipine to pt's Lisinopril.  offered pt BP check at Saturday clinic, pt not interested.  encouraged pt to schedule f/u for early next week.  reviewed red flags that should prompt immediate return.  Pt expresses understanding and is in agreement w/ this plan. The following medications were removed from the medication list:    Hydralazine Hcl 10 Mg Tab (Hydralazine hcl) .Marland Kitchen... Take 1 tablet by mouth twice a day Her updated medication list for this problem includes:    Lisinopril 40 Mg Tabs (Lisinopril) .Marland Kitchen... 1 by mouth qd    Amlodipine Besylate 10 Mg Tabs (Amlodipine besylate) ..... Once daily  Problem #  2:  DIZZINESS (ICD-780.4) Assessment: Unchanged pt's neuro exam WNL today.  has had multiple imaging studies and seen neuro.  reviewed that in reading old notes, pt's sxs have not changed so at this time no need for additional concern. Her updated medication list for this problem includes:    Antivert 12.5 Mg Tabs (Meclizine hcl) ..... One by mouth every 6 hours as needed for dizziness  Problem # 3:  NECK PAIN (ICD-723.1) Assessment: Unchanged pt w/ bilateral trapezius spasm  on PE.  add muscle relaxer for sxs control. Her updated medication list for this problem includes:    Aspirin 81 Mg Tbec (Aspirin) .Marland Kitchen... Take 1 tablet by mouth once a day    Ibuprofen 800 Mg Tabs (Ibuprofen) .Marland Kitchen... 1 by mouth three times a day    Cyclobenzaprine Hcl 5 Mg Tabs (Cyclobenzaprine hcl) .Marland Kitchen... 1-2 tabs two times a day as needed for pain.  may cause drowsiness  Complete Medication List: 1)  Lisinopril 40 Mg Tabs (Lisinopril) .Marland Kitchen.. 1 by mouth qd 2)  Lantus 100 Unit/ml Soln (Insulin glargine) .... 40 units at bedtime 3)  Novolog 100 Unit/ml Soln (Insulin aspart) .... Sliding scale three times a day 4)  Omega-3-6-9 Caps (Omega 3-6-9 fatty acids) .... Take 1 capsule by mouth once a day 5)  Freestyle Test Strp (Glucose blood) .... Checks bs 4x/day dx 250.00 6)  Glucosamine-chondroitin 1500-1200 Mg/67ml Liqd (Glucosamine-chondroitin) .... Take 1 tablet by mouth two times a day 7)  Multivitamin/iron Tabs (Multiple vitamins-iron) 8)  Lite Touch 0.5cc 29g E  .... As directed 9)  Aspirin 81 Mg Tbec (Aspirin) .... Take 1 tablet by mouth once a day 10)  Vitamin D 3 Liquid  .... One drop at bedtime under tongue 11)  Ciclopirox 8 % Soln (Ciclopirox) .... Apply to nail fungus 12)  Ibuprofen 800 Mg Tabs (Ibuprofen) .Marland Kitchen.. 1 by mouth three times a day 13)  Antivert 12.5 Mg Tabs (Meclizine hcl) .... One by mouth every 6 hours as needed for dizziness 14)  Odorless Garlic 500 Mg Tabs (Garlic) .... Take 1 capsule by mouth two times a day 15)  Amlodipine Besylate 10 Mg Tabs (Amlodipine besylate) .... Once daily 16)  Cyclobenzaprine Hcl 5 Mg Tabs (Cyclobenzaprine hcl) .Marland Kitchen.. 1-2 tabs two times a day as needed for pain.  may cause drowsiness  Patient Instructions: 1)  Please schedule a follow up with Dr Drue Novel for next week to recheck blood pressure 2)  Start the Amlodipine daily for blood pressure 3)  Use the cyclobenzaprine for the pain and muscle spasm in the neck.  This may cause drowsiness so first take at  night 4)  Continue the Ibuprofen for pain and inflammation 5)  If you develop chest pain, shortness of breath, severe headache, or visual changes please go to the ER 6)  Hang in there!! Prescriptions: CYCLOBENZAPRINE HCL 5 MG TABS (CYCLOBENZAPRINE HCL) 1-2 tabs two times a day as needed for pain.  may cause drowsiness  #45 x 0   Entered and Authorized by:   Neena Rhymes MD   Signed by:   Neena Rhymes MD on 03/07/2010   Method used:   Electronically to        [facility] [address] [address] (retail)       [address]       [place], [place]  [address]       Ph: [phone]       Fax:  [phone]   RxID:   [id number] AMLODIPINE BESYLATE 10 MG  TABS (AMLODIPINE BESYLATE) once daily  #30 x 1   Entered and Authorized by:   Neena Rhymes MD   Signed by:   Neena Rhymes MD on 03/07/2010   Method used:   Electronically to        [facility] [address] [address] (retail)       [address]       [place], [place]  [address]       Ph: [phone]       Fax: [phone]   RxID:   [id number] CYCLOBENZAPRINE HCL 5 MG TABS (CYCLOBENZAPRINE HCL) 1-2 tabs two times a day as needed for pain.  may cause drowsiness  #45 x 0   Entered and Authorized by:   Neena Rhymes MD   Signed by:   Neena Rhymes MD on 03/07/2010   Method used:   Electronically to        [facility]* (retail)       [address]       [place], [place]  [address]       Ph: [phone]       Fax: [phone]   RxID:   [id number] AMLODIPINE BESYLATE 10 MG  TABS (AMLODIPINE BESYLATE) once daily  #30 x 1   Entered and Authorized by:   Neena Rhymes MD   Signed by:   Neena Rhymes MD on 03/07/2010   Method used:   Electronically to        [facility]* (retail)       [address]       [place], [place]  [address]       Ph: [phone]       Fax: [phone]   RxID:    [id number]

## 2010-12-17 NOTE — Progress Notes (Signed)
Summary: Lab Results  Phone Note Outgoing Call   Call placed by: Lemont Fillers FNP,  July 05, 2010 4:42 PM Call placed to: Patient Summary of Call: Called pateint, left message for her to return our call.  When she calls back pls let her know that her urine culture is negative. Initial call taken by: Lemont Fillers FNP,  July 05, 2010 4:42 PM  Follow-up for Phone Call        call placed to patient at [phone], no answer, call  placed to patient at [phone], she was advised per [facility] instructions Follow-up by: Glendell Docker CMA,  July 08, 2010 9:07 AM

## 2010-12-17 NOTE — Miscellaneous (Signed)
Summary: lab order--b12,folate,rpr,spep  Clinical Lists Changes  Orders: Added new Test order of T-Folate [phone]) - Signed Added new Test order of T-Vitamin B12 [phone]) - Signed Added new Test order of T-RPR (Syphilis) [phone]) - Signed Added new Test order of T-Serum Protein Electrophoresis [phone]) - Signed

## 2010-12-17 NOTE — Progress Notes (Signed)
Summary: elevated BS  Phone Note Call from Patient Call back at [phone]   Caller: Patient Summary of Call: Pt called stating that BS before bed last night was 148. She took 25 units of lantus.  Her fasing BS this a.m. was 175. She took 5 units Novolog and 1/2 Metformin. Blood Sugar now is 292 before lunch.  Please advise.  [name] CMA  May 02, 2010 11:34 AM   Follow-up for Phone Call        Pls call patient and ask her to add 5 units of novolog before lunch as well.  So should continue the 5 units before breakfast and dinner as before.   Follow-up by: Lemont Fillers FNP,  May 02, 2010 12:00 PM  Additional Follow-up for Phone Call Additional follow up Details #1::        attempted to contact patient at [phone], no answer, detailed voice message left informing patient per Sandford Craze instructions. Message left for patient to return call if any additional questions  Additional Follow-up by: Glendell Docker CMA,  May 02, 2010 4:29 PM    New/Updated Medications: NOVOLOG FLEXPEN 100 UNIT/ML SOLN (INSULIN ASPART) 5 units before breakfast, lunch and dinner

## 2010-12-17 NOTE — Progress Notes (Signed)
Summary: referral to [facility]  Phone Note Call from Patient Call back at Home Phone [phone]   Summary of Call: Patient requesting referral to [facility] neurology for dizziness Shary Decamp  February 04, 2010 3:12 PM   Follow-up for Phone Call        I have received a number of records, needs office visit also needs a thyroid ultrasound at her convenience Follow-up by: Jose E. Paz MD,  February 07, 2010 1:13 PM  Additional Follow-up for Phone Call Additional follow up Details #1::        Enrique Sack - please call pt & schedule rov to discuss GI referral Additional Follow-up by: Shary Decamp,  February 08, 2010 11:30 AM    Additional Follow-up for Phone Call Additional follow up Details #2::    PATIENT HAS AN APPT ON APRIL 7,2011 Follow-up by: Barb Merino,  February 08, 2010 3:17 PM

## 2010-12-17 NOTE — Progress Notes (Signed)
Summary: metformin instructions, pen alternative?  Phone Note Call from Patient   Caller: Patient Call For: [name] DO Summary of Call: Pt called stating she was told to take Metformin 1/2 tablet x 2 weeks then increase to 1 daily.  Info from pharmacy says not to crush or chew. Can she still break tablet?   Initial call taken by: [name] CMA,  May 01, 2010 8:52 AM  Follow-up for Phone Call        Left message on machine to return my call.  [name] CMA  May 01, 2010 8:53 AM   Advised pt. that it is ok to cut pill in half. Pt states that her copay for the insulin pen is $40. Wants to know if the insulin vial contains more med than the pen and if so can we call in the vial and syringes as she would possible get more med for her money?  Please advise.  [name] CMA  May 01, 2010 2:12 PM   Additional Follow-up for Phone Call Additional follow up Details #1::        no she gets more insulin when she uses pen.  also inform pt thyroid u/s is normal Additional Follow-up by: [name] DO,  May 01, 2010 4:00 PM    Additional Follow-up for Phone Call Additional follow up Details #2::    Notified pt of Dr. Olegario Messier instructions and u/s result. Advised pt to try the pen and see how long it will last her. If it is still a concern she can discuss it with him when she returns on 05/31/10.  [name] CMA  May 01, 2010 4:08 PM

## 2010-12-17 NOTE — Assessment & Plan Note (Signed)
Summary: RASH NEVER WENT AWAY STOMACH HURTS/MHF   Vital Signs:  Patient profile:   70 year old female Weight:      199 pounds O2 Sat:      98 % on Room air Temp:     98.0 degrees F oral Pulse rate:   76 / minute Pulse rhythm:   regular Resp:     18 per minute BP sitting:   152 / 72  (right arm) Cuff size:   large  Vitals Entered By: Glendell Docker CMA (August 15, 2010 1:59 PM)  O2 Flow:  Room air CC: abdominal pain Is Patient Diabetic? Yes Did you bring your meter with you today? No Pain Assessment Patient in pain? yes     Location: abdomen Intensity: 8 Type: aching Onset of pain  Gradual Comments c/o lower abdominal pain, 3 episodes of fecal  incontinence, bloating with onset of pain   Primary Care Provider:  [name] DO  CC:  abdominal pain.  History of Present Illness: 70 y/o white female c/o intermittent lower abd pain - intermittent loose stools,  BMs vary most of the time she is constipated  she feels like she has to go, but nothing happens  no relation to food  not sure if it's related to start of metformin   she is single dose of 500 mg in AM blood sugars have improved with starting metformin  hx of colonoscopy 2007  she also c/o rash underneath her breasts  Allergies: 1)  ! 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 panel, f/u by Hematology 10-09 had CP, stress test  neg, likely GI (GERD) 09-2006, had CP: stress test  (-)   Past Surgical History: Cholecystectomy Left breast biopsy   Left breast  lumpectomy-ductal carcinoma in situ Appendectomy    Family History: MI--no DM-- M, siblings Colon ca--no breast ca--no       Social History: Single-widow 3 children , 1 in [place] (son, daughter - [place]) [place] to [place] 2002 from [place], [place] Alcohol Use - no    tobacco-- never  Illicit Drug Use - no  Pt is Jehovah's Witness-No blood products Daughter - Kariah Loredo  Review of Systems  The patient denies fever, weight loss, and weight gain.    Physical Exam  General:  alert, well-developed, and well-nourished.   Lungs:  Normal respiratory effort, chest expands symmetrically. Lungs are clear to auscultation, no crackles or wheezes. Heart:  Normal rate and regular rhythm. S1 and S2 normal without gallop, murmur, click, rub or other extra sounds. Abdomen:  soft, non-tender, normal bowel sounds, no masses, no rigidity, no hepatomegaly, and no splenomegaly.   Extremities:  No lower extremity edema   Impression & Recommendations:  Problem # 1:  ABDOMINAL PAIN, LOWER (ICD-789.09) question diverticulosis.  check CT of abd and pelvis probable IBS.  trial of antispasmodic Orders: T-Basic Metabolic Panel [id number]) T-CBC w/Diff [id number]) Radiology Referral (Radiology)  Problem # 2:  DIARRHEA (ICD-787.91) intermittent diarrhea.  rule out  C Diff.  hold metformin  Orders: T-Culture, C-Diff Toxin A/B ([id number]) T-TSH ([id number])  Problem # 3:  DIABETES MELLITUS, TYPE II, UNCONTROLLED (ICD-250.02) Assessment: Improved  The following medications were removed from the medication list:    Metformin Hcl 500 Mg Tabs (Metformin hcl) .Marland Kitchen... Take 1 tablet by mouth once a day Her updated medication list for this problem includes:    Lisinopril 40 Mg Tabs (Lisinopril) .Marland Kitchen... 1 by mouth qd    Lantus Solostar 100 Unit/ml Soln (Insulin glargine) .Marland KitchenMarland KitchenMarland KitchenMarland Kitchen 30 - 50 units once daily at bedtime    Novolog Flexpen 100 Unit/ml Soln (Insulin aspart) .Marland Kitchen... 25-30 units three times a day  before meals  Orders: T-Urine Microalbumin w/creat. ratio [id number]) T- Hemoglobin A1C [phone])  Labs Reviewed: Creat: 0.7 (01/11/2010)    Reviewed HgBA1c results: 8.4 (12/21/2009)  8.3 (04/17/2009)  Problem # 4:  INTERTRIGO, CANDIDAL (ICD-695.89) rash underneath breasts - use antifungals as directed  Complete Medication List: 1)  Lisinopril 40 Mg Tabs (Lisinopril) .Marland Kitchen.. 1 by mouth qd 2)  Lantus Solostar 100 Unit/ml Soln (Insulin glargine) .... 30 - 50 units once daily at bedtime 3)  Novolog Flexpen 100 Unit/ml Soln (Insulin aspart) .... 25-30 units three times a day before meals 4)  Freestyle Test Strp (Glucose blood) .... Checks bs 4x/day dx 250.00 5)  Glucosamine-chondroitin 1500-1200 Mg/58ml Liqd (Glucosamine-chondroitin) .... Take 1 tablet by mouth two times a day 6)  Lite Touch 0.5cc 29g E  .... As directed 7)  Vitamin D 3 Liquid  .... One drop at bedtime under tongue 8)  Antivert 12.5 Mg Tabs (Meclizine hcl) .... One by mouth every 6 hours as needed for dizziness 9)  Odorless Garlic 500 Mg Tabs (Garlic) .... Take 1 capsule by mouth two times a day 10)  Cyclobenzaprine Hcl 5 Mg Tabs (Cyclobenzaprine hcl) .Marland Kitchen.. 1-2 tabs two times a day as needed for pain.  may cause drowsiness 11)  Hydrochlorothiazide 12.5 Mg Caps (Hydrochlorothiazide) .... One by mouth once daily 12)  Pen Needles 1/2" 29g X 12mm Misc (Insulin pen needle) .... Use three times a day as directed 13)  Relion Pen Needles 31g X 8 Mm Misc (Insulin pen needle) .... Use as directed 14)  Pedi-dri 100000 Unit/gm Powd (Nystatin) .... Use two times a day 15)  Hyoscyamine Sulfate 0.125 Mg Tabs (Hyoscyamine sulfate) .... One by mouth three times a day as needed abdominal pain  Patient Instructions: 1)  Stop fish oil supplements 2)  Stop multivitamin 3)  Hold metformin 4)  use lamisil cream as directed for rash 5)  Please schedule a follow-up appointment in 2 weeks. Prescriptions: HYOSCYAMINE SULFATE 0.125 MG  TABS (HYOSCYAMINE SULFATE) one by mouth three times a day as needed abdominal pain  #30 x 0   Entered and Authorized by:   [name] DO   Signed by:   [name] DO on 08/15/2010   Method used:   Electronically to        [facility]* (retail)       [address]       [place], [place]  [address]       Ph: [phone]       Fax: [phone]   RxID:   [id number] PEDI-DRI 100000 UNIT/GM POWD (NYSTATIN) use two times a day  #1 month x 2   Entered and Authorized by:   [name] DO   Signed by:  [name] DO on 08/15/2010   Method used:   Electronically to        [facility] (retail)       [address]       [place], [place]  [address]       Ph: [phone]       Fax: [phone]   RxID:   [id number]   Current Allergies (reviewed today): ! * ACTOS GLUCOPHAGE

## 2010-12-17 NOTE — Assessment & Plan Note (Signed)
Summary: ELEVATED BLOOD SUGARS/DK   Vital Signs:  Patient profile:   70 year old female Weight:      193.75 pounds BMI:     29.57 O2 Sat:      99 % on Room air Temp:     98.0 degrees F oral Pulse rate:   86 / minute Pulse rhythm:   regular Resp:     16 per minute BP sitting:   136 / 70  (left arm) Cuff size:   large  Vitals Entered By: Glendell Docker CMA (May 13, 2010 2:32 PM)  O2 Flow:  Room air CC: Rm 2- Elevated blood Sugars, Type 2 diabetes mellitus follow-up Is Patient Diabetic? Yes Comments blood sugar today 317 prio to breakfeast, before lunch 194,  18 units of Novlog at breakfeast and 8 units prior to lunch.  She is using 25-30 units of Lantus at bedtime. She states she has not started the metformin   Primary Care Provider:  DThomos Lemons DO  CC:  Rm 2- Elevated blood Sugars and Type 2 diabetes mellitus follow-up.  History of Present Illness:  Type 2 Diabetes Mellitus Follow-Up      This is a 70 year old woman who presents for Type 2 diabetes mellitus follow-up.  The patient reports self managed hypoglycemia, but denies hypoglycemia requiring help and weight gain.  The patient denies the following symptoms: chest pain.  Since the last visit the patient reports good dietary compliance, compliance with medications, and monitoring blood glucose.    Allergies: 1)  ! 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 panel, f/u by Hematology 10-09 had CP, stress test  neg, likely GI (GERD) 09-2006, had CP: stress test  (-)   Past Surgical  History: Cholecystectomy Left breast biopsy  Left breast lumpectomy-ductal carcinoma in situ Appendectomy    Family History: MI--no DM-- M, siblings Colon ca--no breast ca--no      Social History: Single-widow 3 children , 1 in [place] (son, daughter - [place]) [place] to [place] 2002 from [place], [place] Alcohol Use - no    tobacco-- never  Illicit Drug Use - no Pt is Jehovah's Witness-No blood products Daughter - Maekayla Giorgio  Physical Exam  General:  alert, well-developed, and well-nourished.   Neck:  No deformities, masses, or tenderness noted.no carotid bruits.   Lungs:  normal respiratory effort and normal breath sounds.   Heart:  normal rate, regular rhythm, and no gallop.   Extremities:  1+ left pedal edema and trace right pedal edema.     Impression & Recommendations:  Problem # 1:  DIABETES MELLITUS, TYPE II, UNCONTROLLED (ICD-250.02) pt still having labile blood sugars.  she is using sliding scale as per endo.  she has not started metformin.  we discussed checking 2 hr post prandial blood sugars and adjusting novolog dose based upon carb intake.  Her updated medication list for this problem includes:    Lisinopril 40 Mg Tabs (Lisinopril) .Marland Kitchen... 1 by mouth qd  Lantus Solostar 100 Unit/ml Soln (Insulin glargine) .Marland KitchenMarland KitchenMarland KitchenMarland Kitchen 30 - 50 units once daily at bedtime    Novolog Flexpen 100 Unit/ml Soln (Insulin aspart) .Marland Kitchen... 25-30 units three times a day before meals    Aspirin 81 Mg Tbec (Aspirin) .Marland Kitchen... Take 1 tablet by mouth once a day  Labs Reviewed: Creat: 0.7 (01/11/2010)    Reviewed HgBA1c results: 8.4 (12/21/2009)  8.3 (04/17/2009)  Complete Medication List: 1)  Lisinopril 40 Mg Tabs (Lisinopril) .Marland Kitchen.. 1 by mouth qd 2)  Lantus Solostar 100 Unit/ml Soln (Insulin glargine) .... 30 - 50 units once daily at bedtime 3)  Novolog Flexpen 100 Unit/ml Soln (Insulin aspart) .... 25-30 units three times a day before meals 4)  Omega-3-6-9 Caps (Omega 3-6-9 fatty acids) .... Take 1  capsule by mouth once a day 5)  Freestyle Test Strp (Glucose blood) .... Checks bs 4x/day dx 250.00 6)  Glucosamine-chondroitin 1500-1200 Mg/57ml Liqd (Glucosamine-chondroitin) .... Take 1 tablet by mouth two times a day 7)  Multivitamin/iron Tabs (Multiple vitamins-iron) 8)  Lite Touch 0.5cc 29g E  .... As directed 9)  Aspirin 81 Mg Tbec (Aspirin) .... Take 1 tablet by mouth once a day 10)  Vitamin D 3 Liquid  .... One drop at bedtime under tongue 11)  Ciclopirox 8 % Soln (Ciclopirox) .... Apply to nail fungus 12)  Antivert 12.5 Mg Tabs (Meclizine hcl) .... One by mouth every 6 hours as needed for dizziness 13)  Odorless Garlic 500 Mg Tabs (Garlic) .... Take 1 capsule by mouth two times a day 14)  Cyclobenzaprine Hcl 5 Mg Tabs (Cyclobenzaprine hcl) .Marland Kitchen.. 1-2 tabs two times a day as needed for pain.  may cause drowsiness 15)  Hydrochlorothiazide 12.5 Mg Caps (Hydrochlorothiazide) .... One by mouth once daily 16)  Pravastatin Sodium 10 Mg Tabs (Pravastatin sodium) .... One by mouth qpm 17)  Pen Needles 1/2" 29g X 12mm Misc (Insulin pen needle) .... Use three times a day as directed 18)  Relion Pen Needles 31g X 8 Mm Misc (Insulin pen needle) .... Use as directed  Patient Instructions: 1)  Please schedule a follow-up appointment in 2 weeks 2)  Keep carbohydrate log 3)  www.diabetes.org 4)  http://tracker.diabetes.org/ 5)  Give yourself at least 15 units of Novolog with breakfast (35-40 gram of carb) Prescriptions: RELION PEN NEEDLES 31G X 8 MM MISC (INSULIN PEN NEEDLE) use as directed  #60 x 1   Entered and Authorized by:   [name] DO   Signed by:   [name] DO on 05/13/2010   Method used:   Electronically to        [facility] (retail)       [address]       [place], [place]  [address]       Ph: [phone]       Fax: [phone]   RxID:   [id number] RELION PEN NEEDLES 31G X 8 MM MISC (INSULIN PEN NEEDLE) use as directed   #60 x 1   Entered and Authorized by:   [name] DO   Signed by:   [name] DO on 05/13/2010   Method used:   Electronically to        [facility] # [phone]* (retail)       [address]       [employer]  [place], [place]  [address]       Ph: [phone]       Fax: [phone]   RxID:   [id number] METFORMIN HCL 500 MG TABS (METFORMIN HCL) take by mouth two times a day  #60 x 2   Entered and Authorized by:   [name] DO   Signed by:   [name] DO on 05/13/2010   Method used:   Print then Give to Patient   RxID:   [id number] NOVOLOG FLEXPEN 100 UNIT/ML SOLN (INSULIN ASPART) 25-30 units three times a day before meals  #1 month x 2   Entered and Authorized by:   [name] DO   Signed by:   [name] DO on 05/13/2010   Method used:   Print then Give to Patient   RxID:   [id number] LANTUS SOLOSTAR 100 UNIT/ML SOLN (INSULIN GLARGINE) 30 - 50 units once daily at bedtime  #1 month x 2   Entered and Authorized by:   [name] DO   Signed by:   [name] DO on 05/13/2010   Method used:   Print then Give to Patient   RxID:   [id number]   Current Allergies (reviewed today): ! * ACTOS GLUCOPHAGE

## 2010-12-17 NOTE — Letter (Signed)
Summary: Generic Letter  [facility]  [address]   [place], [place] [address]   Phone: [phone]  Fax: [phone]    01/30/2010  Dear Dr. [name],  I have seen Ms Cervenka for pre-operative evaluation.  She appears to be low risk for her upcoming ear surgery and has been cleared from a medical standpoint.    Sincerely,   Sandford Craze FNP  Appended Document: Generic Letter Letter faxed to Dr. [name] office at [phone].

## 2010-12-17 NOTE — Progress Notes (Signed)
Summary: Refill Request  Phone Note Refill Request Call back at [phone] Message from:  Pharmacy on March 11, 2010 1:07 PM  Refills Requested: Medication #1:  FREESTYLE TEST   STRP checks BS 4x/day dx 250.00   Dosage confirmed as above?Dosage Confirmed   Supply Requested: 300   Last Refilled: 03-15-10 Need diagnosis codes wirtten on ALL rx's.  [facility] on [address]   Next Appointment Scheduled: today Initial call taken by: Harold Barban,  March 11, 2010 1:08 PM    Prescriptions: FREESTYLE TEST   STRP (GLUCOSE BLOOD) checks BS 4x/day dx 250.00  #300 x 0   Entered by:   Shary Decamp   Authorized by:   Nolon Rod. Paz MD   Signed by:   Shary Decamp on 03/11/2010   Method used:   Electronically to        [facility] # [phone]* (retail)       [address]       [place], [place]  [address]       Ph: [phone]       Fax: [phone]   RxID:   [id number]

## 2010-12-17 NOTE — Progress Notes (Signed)
Summary: Urinary Discomfort  Phone Note Call from Patient Call back at Home Phone [phone]   Caller: Patient Call For: [name] DO Summary of Call: patient called and left voice message requesting a anitbiotic for urinary infection. Call was returned to patient at [phone],she was informed antibiotics are not prescribed without evaluation. She was offered and appointment, but declines at present. She wanted to know what she could take for her symptoms. She states she is having severe burning when she finishes urinating. She was advisd that she could try Uristat or Azo over the counter and if there was no improvement in her symptoms she was strongly encouraged to return for evaluation. Patient verbalized understanding and agrees as instructed Initial call taken by: Glendell Docker CMA,  June 28, 2010 11:47 AM

## 2010-12-17 NOTE — Assessment & Plan Note (Signed)
Summary: SURGICAL CLEARANCE PER DR KRAUS,OK PER SH/RH......Marland Kitchen   Vital Signs:  Patient profile:   71 year old female Weight:      193.50 pounds BMI:     29.53 Temp:     98.6 degrees F oral Pulse rate:   72 / minute Pulse rhythm:   regular Resp:     16 per minute BP sitting:   130 / 70  (right arm) Cuff size:   regular  Vitals Entered By: [name] CMA (January 11, 2010 10:08 AM) CC: room 16  Needs surgical clearance Is Patient Diabetic? Yes Comments Pt needs clearance for surgery with Dr. Jac [name] on left ear.   Primary Care Provider:  Willow [name], [name]  CC:  room 16  Needs surgical clearance.  History of Present Illness: [name] is a 70 year old female who presents today for pre-op evaluation for her upcoming ear surgery.  Notes that he she saw neurology (Dr. Marjory [name]) and had ESI of her neck which was done by Dr. Ethelene [name] approximately two weeks ago.  Notes some mild improvement.  She tells me that she has a history of L TM perforation since childhood and that this is being performed due to hearing issues.  She denies fever, sinus pressure of drainage from her ears.  Allergies: 1)  ! * Actos 2)  ! Glucophage  Review of Systems       Constitutional: Denies Fever ENT:  Denies nasal congestion or sore throat. Resp: Denies cough CV:  Denies Chest Pain GI:  Denies nausea or vomitting GU: Denies dysuria Lymphatic: Denies lymphadenopathy Musculoskeletal:  OA in back, radiates down right leg. Skin:  Denies Rashes, notes skin lesions on right arm (ecchymosis) Was taking 4  81 mmg aSA's - now only taking 81 mg  Psychiatric: Denies depression Neuro: Denies numbness, notes bilateral lower extremity weakness.       Physical Exam  General:  Well-developed,well-nourished,in no acute distress; alert,appropriate and cooperative throughout examination Head:  Normocephalic and atraumatic without obvious abnormalities. No apparent alopecia or balding. Eyes:  PERRLa Ears:   perforated R TM, L ear normal Neck:  No deformities, masses, or tenderness noted. Lungs:  Normal respiratory effort, chest expands symmetrically. Lungs are clear to auscultation, no crackles or wheezes. Heart:  Normal rate and regular rhythm. S1 and S2 normal without gallop, murmur, click, rub or other extra sounds. Extremities:  No clubbing, cyanosis, edema, or deformity noted with normal full range of motion of all joints.   Neurologic:  alert & oriented X3.     Impression & Recommendations:  Problem # 1:  PREOPERATIVE EXAMINATION (ICD-V72.84) EKG WNL, plan check baseline lab work. Orders: Venipuncture (04540) TLB-BMP (Basic Metabolic Panel-BMET) (80048-METABOL) TLB-Hepatic/Liver Function Pnl (80076-HEPATIC) TLB-CBC Platelet - w/Differential (85025-CBCD) TLB-PT (Protime) (85610-PTP) TLB-PTT (85730-PTTL)  Problem # 2:  DIABETES MELLITUS, TYPE I (ICD-250.01) Assessment: Unchanged A1C is 8.4- patient followed by endocrinology.  Will fax note to Endo and see if they are comfortable clearing her from a DM standpoint.      Lisinopril 40 Mg Tabs (Lisinopril) .Marland Kitchen... 1 by mouth qd    Lantus 100 Unit/ml Soln (Insulin glargine) .Marland KitchenMarland KitchenMarland KitchenMarland Kitchen 40 units at bedtime    Novolog 100 Unit/ml Soln (Insulin aspart) ..... Sliding scale three times a day    Aspirin 81 Mg Tbec (Aspirin) .Marland Kitchen... Take 1 tablet by mouth once a day  Labs Reviewed: Creat: 0.8 (12/21/2009)    Reviewed HgBA1c results: 8.4 (12/21/2009)  8.3 (04/17/2009)  Complete Medication List: 1)  Lisinopril 40 Mg Tabs (Lisinopril) .Marland Kitchen.. 1 by mouth qd 2)  Lantus 100 Unit/ml Soln (Insulin glargine) .... 40 units at bedtime 3)  Novolog 100 Unit/ml Soln (Insulin aspart) .... Sliding scale three times a day 4)  Hydralazine Hcl 10 Mg Tab (Hydralazine hcl) .... Take 1 tablet by mouth twice a day 5)  Omega-3-6-9 Caps (Omega 3-6-9 fatty acids) .... Take 1 capsule by mouth once a day 6)  Freestyle Test Strp (Glucose blood) .... Checks bs 4x/day 7)   Glucosamine-chondroitin 1500-1200 Mg/81ml Liqd (Glucosamine-chondroitin) .... Take 1 tablet by mouth two times a day 8)  Multivitamin/iron Tabs (Multiple vitamins-iron) 9)  Lite Touch 0.5cc 29g E  .... As directed 10)  Aspirin 81 Mg Tbec (Aspirin) .... Take 1 tablet by mouth once a day 11)  Vitamin D 3 Liquid  .... One drop at bedtime under tongue 12)  Ciclopirox 8 % Soln (Ciclopirox) .... Apply to nail fungus 13)  Ibuprofen 800 Mg Tabs (Ibuprofen) .Marland Kitchen.. 1 by mouth three times a day 14)  Antivert 12.5 Mg Tabs (Meclizine hcl) .... One by mouth every 6 hours as needed for dizziness 15)  Odorless Garlic 500 Mg Tabs (Garlic) .... Take 1 capsule by mouth two times a day  Patient Instructions: 1)  You will be contacted about your surgical clearance. 2)  Please complete your lab work prior to leaving today.   Current Allergies (reviewed today): ! * ACTOS ! GLUCOPHAGE  Appended Document: SURGICAL CLEARANCE PER DR KRAUS,OK PER SH/RH......Marland Kitchen Reviewed letter of clearance from patient's endocinology office.  Patient is cleared for surgery.  Appended Document: SURGICAL CLEARANCE PER DR KRAUS,OK PER SH/RH....... Spoke to pt. re: need to have CXR, PT/PTT per Dr. Dorma [name] office for surgical clearance. Pt stated she is in [facility] to see if they "could find out what is wrong with her"  and would not talk with me re: completing surgical clearance.  Notified [name] of pt. response. Tf,cma

## 2010-12-17 NOTE — Progress Notes (Signed)
Summary: refill  Phone Note Refill Request Message from:  Fax from Pharmacy on [address] fax [phone]  Refills Requested: Medication #1:  LISINOPRIL 40 MG  TABS 1 by mouth QD Initial call taken by: Barb Merino,  November 29, 2009 11:24 AM    Prescriptions: LISINOPRIL 40 MG  TABS (LISINOPRIL) 1 by mouth QD  #30 x 3   Entered by:   Shary Decamp   Authorized by:   Nolon Rod. Paz MD   Signed by:   Shary Decamp on 11/29/2009   Method used:   Electronically to        [facility]* (retail)       [address]       [place], [place]  [address]       Ph: [phone]       Fax: [phone]   RxID:   [id number]

## 2010-12-17 NOTE — Progress Notes (Signed)
Summary: CXR, labs needed for surgical clearance  Phone Note Outgoing Call   Summary of Call: Pls fax copy of clearance note to Dr. [name] of ENT.   Also, pls notify patient that she has been cleared for surgery. Lemont Fillers FNP,  January 20, 2010 10:21 PM   TRICIA, HEATHER FROM DR. KRAUSS OFFICE JUST CALLED, THEY NEED THE PATIENT TO GO FOR A CHEST XRAY WHICH IS PART OF SURGICAL CLEAR, AND THEY ARE ALSO REQUESTING A LETTER OF SURGICAL CLEARANCE BE FAXED TO THEM. Initial call taken by: Magdalen Spatz [facility],  January 21, 2010 11:11 AM  Follow-up for Phone Call        Patient needs to complete CXR and PT/PTT(V72.84) also per Dr. [name]' office note.  Please ask pt to return to complete these.  Can we pls leave a copy of the CXR order at front desk for her to pick up.  She can bring to [facility] or med-center. She does not need a full visit.  Thanks Follow-up by: Lemont Fillers FNP,  January 21, 2010 1:38 PM  Additional Follow-up for Phone Call Additional follow up Details #1::        Spoke to pt. to notify her of need for CXR & PT/PTT for surgical clearance.  Pt. states she has checked herself into [facility]. to see if they can find out what is wrong with her. She was very agitated and stated she could not talk with me. Additional Follow-up by: [name] CMA,  January 21, 2010 2:42 PM

## 2010-12-17 NOTE — Progress Notes (Signed)
Summary: refill  Phone Note Refill Request Message from:  Fax from Pharmacy on [address] fax [phone]  Refills Requested: Medication #1:  LISINOPRIL 40 MG  TABS 1 by mouth QD Initial call taken by: Barb Merino,  December 20, 2009 2:29 PM    Prescriptions: LISINOPRIL 40 MG  TABS (LISINOPRIL) 1 by mouth QD  #30 x 6   Entered by:   Shary Decamp   Authorized by:   Nolon Rod. Paz MD   Signed by:   Shary Decamp on 12/20/2009   Method used:   Electronically to        [facility]* (retail)       [address]       [place], [place]  [address]       Ph: [phone]       Fax: [phone]   RxID:   [id number]

## 2010-12-17 NOTE — Progress Notes (Signed)
  Phone Note Outgoing Call   Summary of Call: pt notified of negative venous doppler Initial call taken by: [name] DO,  April 29, 2010 5:53 PM

## 2010-12-17 NOTE — Progress Notes (Signed)
Summary: reaction to med  Phone Note Refill Request Call back at Home Phone [phone]   Refills Requested: Medication #1:  AMLODIPINE BESYLATE 10 MG  TABS once daily  pt states that she is unable to take amlodipine. pt c/o swelling of ankle and irration of the throat due to new med. pt unable to confirm or deny any warmness, or redness. offer pt OV to come in so that swelling may be evaluated, pt decline stating that she was just in the office. last OV 03-11-10 pt would prefer message be forward to dr Drue Novel to address. pls advise...............Marland KitchenFelecia Deloach CMA  March 14, 2010 11:08 AM    Follow-up for Phone Call        d/c amlodipine, call felodipine 5mg  1 by mouth once daily  call w/ BP readings in 2 weeks  E. Paz MD  March 14, 2010 1:38 PM   Additional Follow-up for Phone Call Additional follow up Details #1::        Spoke with patient, patient ok'd Dr.Paz's instruction. Patient said she does not know how she is going to check her B/P cause she doesnt have the money to by a New Cuff, patient said she has a wrist cuff but was told the readings are inaccurate. I informed patient to go to a Soil scientist of the pharmacy. Additional Follow-up by: Shonna Chock,  March 14, 2010 1:59 PM    New/Updated Medications: FELODIPINE 5 MG XR24H-TAB (FELODIPINE) 1 by mouth once daily Prescriptions: FELODIPINE 5 MG XR24H-TAB (FELODIPINE) 1 by mouth once daily  #30 x 3   Entered by:   Shonna Chock   Authorized by:   Nolon Rod. Paz MD   Signed by:   Shonna Chock on 03/14/2010   Method used:   Electronically to        [facility] (retail)       [address]       [place], [place]  [address]       Ph: [phone]       Fax: [phone]   RxID:   [id number]

## 2010-12-17 NOTE — Assessment & Plan Note (Signed)
Summary: COUGH AND CONGESTION/MHF   Vital Signs:  Patient profile:   70 year old female Height:      68 inches Weight:      201.50 pounds BMI:     30.75 O2 Sat:      98 % on Room air Temp:     98.1 degrees F oral Pulse rate:   71 / minute Resp:     18 per minute BP sitting:   140 / 60  (right arm) Cuff size:   large  Vitals Entered By: Glendell Docker CMA (October 14, 2010 11:50 AM)  O2 Flow:  Room air CC: Cold, Red eye Is Patient Diabetic? Yes Pain Assessment Patient in pain? no        Primary Care Andrena Margerum:  [name] DO  CC:  Cold and Red eye.  History of Present Illness:  70 y/o white female c/o nonproductive cough left ear feels clogged no improvement with OTC cough syrup  Red Eye      This is a 70 year old woman who presents with Red eye.  The patient reports redness of the left eye, redness of the right eye, and discharge, but denies pain, blurring of vision, and loss of vision.  The patient denies the following risk factors: foreign body in eye, history of glaucoma, and contact lens usage.    Preventive Screening-Counseling & Management  Alcohol-Tobacco     Smoking Status: never  Allergies: 1)  ! * Actos 2)  ! 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   Eyes:  bilateral conjunctival injection. pupils equal, pupils round, and pupils reactive to light.   Ears:  bilateral TMs retracted Mouth:  pharynx pink and moist.   Lungs:  normal respiratory effort and normal breath sounds.   Heart:  normal rate, regular rhythm, and no gallop.     Impression & Recommendations:  Problem # 1:  UNSPECIFIED CONJUNCTIVITIS (ICD-372.30)  use eyedrops as directed Patient advised to call office if she experiences any eye pain or change in her vision  Problem # 2:  RHINOSINUSITIS, ACUTE (ICD-461.8)  Her updated medication list for this problem includes:    Cefuroxime Axetil 500 Mg Tabs (Cefuroxime axetil) ..... One by mouth two times a day  Instructed on  treatment. Call if symptoms persist or worsen.   Patient Instructions: 1)  Call our office if your symptoms do not  improve or gets worse. Prescriptions: TOBRAMYCIN-DEXAMETHASONE 0.3-0.1 % SUSP (TOBRAMYCIN-DEXAMETHASONE) 2 drops to each eye 4 x daily x 1 week  #1 week x 0   Entered and Authorized by:   [name] DO   Signed by:   [name] DO on 10/14/2010   Method used:   Electronically to        [facility] (retail)       [address]       [place], [place]  [address]       Ph: [phone]       Fax: [phone]   RxID:   [id number] CEFUROXIME AXETIL 500 MG TABS (CEFUROXIME AXETIL) one by mouth two times a day  #20 x 0   Entered and Authorized by:   [name] DO   Signed by:   [name] DO on 10/14/2010   Method used:    Electronically to        [facility] (retail)       [address]       [place], [place]  [address]       Ph: [phone]       Fax: [phone]   RxID:   [id number]    Orders Added: 1)  Est. Patient Level III [[address]]    Current Allergies (reviewed today): ! * ACTOS ! FLUCONAZOLE (FLUCONAZOLE) GLUCOPHAGE

## 2010-12-17 NOTE — Progress Notes (Signed)
  Phone Note Outgoing Call   Summary of Call: call pt - sleep study shows OSA.  I suggest referral to Dr. Vassie Loll Initial call taken by: [name] DO,  July 04, 2010 1:00 PM  New Problems: SLEEP APNEA, OBSTRUCTIVE (ICD-327.23)   New Problems: SLEEP APNEA, OBSTRUCTIVE (ICD-327.23)

## 2010-12-17 NOTE — Letter (Signed)
   [facility] [address] [place], [place] [address] [phone]    January 11, 2010   [name] [address] [place], [place] [address]  RE:  LAB RESULTS  Dear  [name],  The following is an interpretation of your most recent lab tests.  Please take note of any instructions provided or changes to medications that have resulted from your lab work.  ELECTROLYTES:  Good - no changes needed  KIDNEY FUNCTION TESTS:  Good - no changes needed  LIVER FUNCTION TESTS:  Good - no changes needed  LIPID PANEL:  Fair - review at your next visit Triglyceride: 97.0   Cholesterol: 198   LDL: 125   HDL: 32   Chol/HDL%:  5   DIABETIC STUDIES:  Poor - schedule a follow-up appointment soon Blood Glucose: 204   HgbA1C: 8.4     CBC:  Good - no changes needed Please arrange follow up with your Endocrinologist to discuss your insulin dosages.    Sincerely Yours,    Lemont Fillers FNP

## 2010-12-17 NOTE — Progress Notes (Signed)
Summary: Metformin Problems  Phone Note Call from Patient Call back at Home Phone [phone]   Caller: Patient Call For: [name] DO Summary of Call: patient called and left voice message stating she attempted a whole pill of the Metformin last night and she did not tolerate well. Her message states she has tingling in her feet, leg pain and chest pain in th middle of the night. Her message stated she did try a 1/2 pill at first and then went to a whole pill. Initial call taken by: Glendell Docker CMA,  May 14, 2010 8:46 AM  Follow-up for Phone Call        try again.  take medication with dinner and drink extra water Follow-up by: [name] DO,  May 14, 2010 12:20 PM  Additional Follow-up for Phone Call Additional follow up Details #1::        call placed to patient at [phone], no answer, voice message left for patient to return call Glendell Docker [facility]  May 14, 2010 2:38 PM     Additional Follow-up for Phone Call Additional follow up Details #2::    call returned from patient, she was advised per Dr Artist Pais instructions. Patient states that she has been taking the medication with food, the pain in her chest lasted 2 hour this morning around  5am, and she is still having the tingling in her feet.  Glendell Docker CMA  May 14, 2010 4:42 PM   patient advised to discontinue  the Metformin per Dr Artist Pais instructions due to patient concerns with how it is making her feel Follow-up by: Glendell Docker CMA,  May 14, 2010 4:43 PM

## 2010-12-17 NOTE — Progress Notes (Signed)
Summary: refill  Phone Note Refill Request Message from:  Fax from Pharmacy on Apr 09, 2010 2:40 PM  Refills Requested: Medication #1:  CYCLOBENZAPRINE HCL 5 MG TABS 1-2 tabs two times a day as needed for pain.  may cause drowsiness. fax from [employer] [address] - fax [phone] - ph [phone]   Method Requested: Fax to Local Pharmacy Initial call taken by: Okey Regal [place],  Apr 09, 2010 2:41 PM  Follow-up for Phone Call        #45 with no refills on 03/07/10.Marland KitchenMarland KitchenShary Decamp  Apr 09, 2010 3:04 PM ok 45, no Rf Noor Witte E. Baylen Dea MD  Apr 10, 2010 12:59 PM and    Prescriptions: CYCLOBENZAPRINE HCL 5 MG TABS (CYCLOBENZAPRINE HCL) 1-2 tabs two times a day as needed for pain.  may cause drowsiness  #45 x 0   Entered by:   Army Fossa CMA   Authorized by:   Nolon Rod. Chester Romero MD   Signed by:   Army Fossa CMA on 04/10/2010   Method used:   Electronically to        [facility] (retail)       [address]       [place], [place]  [address]       Ph: [phone]       Fax: [phone]   RxID:   [id number]

## 2010-12-17 NOTE — Progress Notes (Signed)
Summary: Metformin Denial  Phone Note Refill Request Message from:  Fax from Pharmacy on July 30, 2010 8:47 AM  Refills Requested: Medication #1:  metformin 500mg  tab   Dosage confirmed as above?Dosage Confirmed   Brand Name Necessary? No   Supply Requested: 1 month   Last Refilled: 07/29/2010 Karin Golden [address] [place] [address] phone [phone] fax [phone]    Method Requested: Electronic Next Appointment Scheduled: 07-30-2010 Dr Vassie Loll Initial call taken by: Roselle Locus,  July 30, 2010 8:49 AM  Follow-up for Phone Call        Rx denied because the medication has been discontinued, call placed to [facility], voice messsage left informing rx denied Follow-up by: Glendell Docker CMA,  July 30, 2010 8:57 AM

## 2010-12-17 NOTE — Assessment & Plan Note (Signed)
Summary: 2 week follow up/mhf   Vital Signs:  Patient profile:   70 year old female Height:      68 inches Weight:      199.75 pounds BMI:     30.48 O2 Sat:      99 % on Room air Temp:     98.3 degrees F oral Pulse rate:   71 / minute Pulse rhythm:   regular Resp:     18 per minute BP sitting:   142 / 70  (right arm) Cuff size:   large  Vitals Entered By: Glendell Docker CMA (August 26, 2010 8:50 AM)  O2 Flow:  Room air CC: 2 Week Follow up Is Patient Diabetic? Yes Pain Assessment Patient in pain? no      Comments rash is worse, she states the powder prescribed has not helped any. The area remains "wet", states that it is itching, but she has not been scracthing, but she is now having pain at the site. She states he insurance would not cover the Hyoscomine, she had to pay out of pocket for the medication   Primary Care Brittnay Pigman:  [name] DO  CC:  2 Week Follow up.  History of Present Illness: 70 y/o white female for f/u since prev visit, diarrhea and abd bloating much better she stopped metformin, fish oil and MVI  DM II - sugars worse since stopping metformin she is not counting carbs  htn - she did not take her meds this AM  intertrigo candida - peri dri did not help.  area weaping in AM  Allergies: 1)  ! * Actos 2)  Glucophage  Past History:  Past Medical History: h/o COLONIC POLYPS    CP 10-2009: cath negative  HYPERTENSION   DIABETES MELLITUS II    OSTEOARTHRITIS   GERD  Osteopenia Lupus of skin-- sees dermatology frequently  left ductal  Breast CA Dx 2003 aprox, s/p surgery, XRT x 6 weeks , released from oncology (per patient)   yearly mammograms OA C-spine R leg DVT 05-17-2008, to  d/c coumadin 11-09  L leg swelling 2-10, u/s showed a old clot , had a hypercoag. panel, f/u by Hematology 10-09 had CP, stress test  neg, likely GI (GERD) 09-2006, had CP: stress test  (-)   Past Surgical History: Cholecystectomy Left breast biopsy   Left  breast lumpectomy-ductal carcinoma in situ Appendectomy     Family History: MI--no DM-- M, siblings Colon ca--no breast ca--no        Social History: Single-widow 3 children , 1 in [place] (son, daughter - [place])  [place] to [place] 2002 from [place], [place] Alcohol Use - no    tobacco-- never  Illicit Drug Use - no  Pt is Jehovah's Witness-No blood products Daughter - Yvanna Vidas  Physical Exam  General:  alert, well-developed, and well-nourished.   Lungs:  Normal respiratory effort, chest expands symmetrically. Lungs are clear to auscultation, no crackles or wheezes. Heart:  Normal rate and regular rhythm. S1 and S2 normal without gallop, murmur, click, rub or other extra sounds. Skin:  large intertriginous rash underneath right breast   Impression & Recommendations:  Problem # 1:  INTERTRIGO, CANDIDAL (ZOX-096.04) Assessment Deteriorated she may have secondary cellulitis.  tx with keflex use loprox gel as directed Patient advised to call office if symptoms persist or worsen.  Problem # 2:  DIABETES MELLITUS, TYPE II, UNCONTROLLED (ICD-250.02) Assessment: Deteriorated BS worse since stopping metformin.  she can not tolerate  metformin due to GI side effects.   add Januvia  Her updated medication list for this problem includes:    Lisinopril 40 Mg Tabs (Lisinopril) .Marland Kitchen... 1 by mouth qd    Lantus Solostar 100 Unit/ml Soln (Insulin glargine) .Marland KitchenMarland KitchenMarland KitchenMarland Kitchen 30 - 50 units once daily at bedtime    Novolog Flexpen 100 Unit/ml Soln (Insulin aspart) .Marland Kitchen... 25-30 units three times a day before meals    Januvia 100 Mg Tabs (Sitagliptin phosphate)  Problem # 3:  HYPERTENSION (ICD-401.9) she did not take her BP meds this AM  Her updated medication list for this problem includes:    Lisinopril 40 Mg Tabs (Lisinopril) .Marland Kitchen... 1 by mouth qd    Hydrochlorothiazide 12.5 Mg Caps (Hydrochlorothiazide) ..... One by mouth once daily  BP today: 142/70 Prior BP: 152/72 (08/15/2010)  Labs  Reviewed: K+: 4.3 (08/16/2010) Creat: : 0.74 (08/16/2010)   Chol: 198 (12/21/2009)   HDL: 32 (12/21/2009)   LDL: 125 (12/21/2009)   TG: 207 (12/21/2009)  Problem # 4:  ABDOMINAL PAIN, LOWER (ICD-789.09) Assessment: Improved Probable IBS better with stopping metformin, MVI and fish oil  Her updated medication list for this problem includes:    Cyclobenzaprine Hcl 5 Mg Tabs (Cyclobenzaprine hcl) .Marland Kitchen... 1-2 tabs two times a day as needed for pain.  may cause drowsiness  Complete Medication List: 1)  Lisinopril 40 Mg Tabs (Lisinopril) .Marland Kitchen.. 1 by mouth qd 2)  Lantus Solostar 100 Unit/ml Soln (Insulin glargine) .... 30 - 50 units once daily at bedtime 3)  Novolog Flexpen 100 Unit/ml Soln (Insulin aspart) .... 25-30 units three times a day before meals 4)  Freestyle Test Strp (Glucose blood) .... Checks bs 4x/day dx 250.00 5)  Glucosamine-chondroitin 1500-1200 Mg/36ml Liqd (Glucosamine-chondroitin) .... Take 1 tablet by mouth two times a day 6)  Lite Touch 0.5cc 29g E  .... As directed 7)  Vitamin D 3 Liquid  .... One drop at bedtime under tongue 8)  Antivert 12.5 Mg Tabs (Meclizine hcl) .... One by mouth every 6 hours as needed for dizziness 9)  Odorless Garlic 500 Mg Tabs (Garlic) .... Take 1 capsule by mouth two times a day 10)  Cyclobenzaprine Hcl 5 Mg Tabs (Cyclobenzaprine hcl) .Marland Kitchen.. 1-2 tabs two times a day as needed for pain.  may cause drowsiness 11)  Hydrochlorothiazide 12.5 Mg Caps (Hydrochlorothiazide) .... One by mouth once daily 12)  Pen Needles 1/2" 29g X 12mm Misc (Insulin pen needle) .... Use three times a day as directed 13)  Relion Pen Needles 31g X 8 Mm Misc (Insulin pen needle) .... Use as directed 14)  Pedi-dri 100000 Unit/gm Powd (Nystatin) .... Use two times a day 15)  Hyoscyamine Sulfate 0.125 Mg Tabs (Hyoscyamine sulfate) .... One by mouth three times a day as needed abdominal pain 16)  Januvia 100 Mg Tabs (Sitagliptin phosphate) 17)  Cephalexin 500 Mg Caps (Cephalexin)  .... One by mouth three times a day 18)  Ciclopirox 0.77 % Gel (Ciclopirox) .... Apply two times a day x 4 weeks  Patient Instructions: 1)  Please schedule a follow-up appointment in 3 months. 2)  BMP prior to visit, ICD-9:  401.9 3)  HbgA1C prior to visit, ICD-9: 250.02 4)  Please return for lab work one (1) week before your next appointment.  Prescriptions: CICLOPIROX 0.77 % GEL (CICLOPIROX) apply two times a day x 4 weeks  #1 month x 0   Entered and Authorized by:   [name] DO   Signed by:   D.  Thomos Lemons DO on 08/26/2010   Method used:   Electronically to        [facility] (retail)       [address]       [place], [place]  [address]       Ph: [phone]       Fax: [phone]   RxID:   [id number] CEPHALEXIN 500 MG CAPS (CEPHALEXIN) one by mouth three times a day  #21 x 0   Entered and Authorized by:   [name] DO   Signed by:   [name] DO on 08/26/2010   Method used:   Electronically to        [facility] (retail)       [address]       [place], [place]  [address]       Ph: [phone]       Fax: [phone]   RxID:   [id number]    Current Allergies (reviewed today): ! * ACTOS GLUCOPHAGE    Appended Document: Orders Update    Clinical Lists Changes  Orders: Added new Service order of Influenza Vaccine MCR [id number]) - Signed Added new Service order of Flu Vaccine 57yrs + MEDICARE PATIENTS (S0630) - Signed Observations: Added new observation of FLU VAX#1VIS: 06/11/10 version given August 26, 2010. (08/26/2010 9:40) Added new observation of FLU VAXLOT: [id number] (08/26/2010 9:40) Added new observation of FLU VAX EXP: 05/17/2011 (08/26/2010 9:40) Added new observation of FLU VAXBY: Darlene Knight CMA (08/26/2010 9:40) Added new observation of FLU VAXRTE: IM (08/26/2010 9:40) Added new observation of FLU VAX DSE: 0.5 ml (08/26/2010  9:40) Added new observation of FLU VAXMFR: Merck (08/26/2010 9:40) Added new observation of FLU VAX SITE: left deltoid (08/26/2010 9:40) Added new observation of FLU VAX: Fluvax MCR (08/26/2010 9:40)       Immunizations Administered:  Influenza Vaccine # 1:    Vaccine Type: Fluvax MCR    Site: left deltoid    Mfr: Merck    Dose: 0.5 ml    Route: IM    Given by: Glendell Docker CMA    Exp. Date: 05/17/2011    Lot #: [id number]    VIS given: 06/11/10 version given August 26, 2010.  Flu Vaccine Consent Questions:    Do you have a history of severe allergic reactions to this vaccine? no    Any prior history of allergic reactions to egg and/or gelatin? no    Do you have a sensitivity to the preservative Thimersol? no    Do you have a past history of Guillan-Barre Syndrome? no    Do you currently have an acute febrile illness? no    Have you ever had a severe reaction to latex? no    Vaccine information given and explained to patient? yes    Are you currently pregnant? no

## 2010-12-19 NOTE — Assessment & Plan Note (Signed)
Summary: ear swollen and leaking/mhf   Vital Signs:  Patient profile:   70 year old female Height:      68 inches Weight:      205.25 pounds BMI:     31.32 O2 Sat:      100 % on Room air Temp:     98.0 degrees F oral Pulse rate:   87 / minute Pulse rhythm:   regular Resp:     18 per minute BP sitting:   132 / 64  (right arm) Cuff size:   large  Vitals Entered By: Glendell Docker CMA (November 07, 2010 8:30 AM)  O2 Flow:  Room air CC: Bilateral ear irritation Is Patient Diabetic? Yes Pain Assessment Patient in pain? no        Primary Care Provider:  [name] DO  CC:  Bilateral ear irritation.  History of Present Illness: 70 y/o female c/o bilateral ear irritation with clear drainage for the past 2 weeks  vaginal irriration since last office visit- similar to what she has experienced at last office visit    Preventive Screening-Counseling & Management  Alcohol-Tobacco     Smoking Status: never  Allergies: 1)  ! * Actos 2)  ! 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   Ears:  mild redness and swelling of bilateral ear canals right and left TM is normal Lungs:  normal respiratory effort  and normal breath sounds.   Heart:  normal rate, regular rhythm, and no gallop.     Impression & Recommendations:  Problem # 1:  OTITIS EXTERNA, BILATERAL (ICD-380.10)  Discussed treatment and preventive measures.  Patient advised to call office if symptoms persist or worsen.  Problem # 2:  PRURITUS, VAGINAL (ICD-698.1)  Orders: Gynecologic Referral (Gyn)  Complete Medication List: 1)  Lisinopril 40 Mg Tabs (Lisinopril) .... Take 1 tablet by mouth once a day 2)  Freestyle Test Strp (Glucose blood) .... Checks bs 4x/day dx 250.00 3)  Glucosamine-chondroitin 1500-1200 Mg/44ml Liqd (Glucosamine-chondroitin) .... Take 1 tablet by mouth two times a day 4)  Vitamin D3 1000000 Unit/gm Liqd (Cholecalciferol) .... 2,000 international units (one drop) by mouth once daily at bedtime 5)  Odorless Garlic 500 Mg Tabs (Garlic) .... Take 1 capsule by mouth two times a day 6)  Hydrochlorothiazide 12.5 Mg Caps (Hydrochlorothiazide) .... One by mouth once daily 7)  Pen Needles 1/2" 29g X 12mm Misc (Insulin pen needle) .... Use three times a day as directed 8)  Relion Pen Needles 31g X 8 Mm Misc (Insulin pen needle) .... Use as directed 9)  Pedi-dri 100000 Unit/gm Powd (Nystatin) .... Use two times a day 10)  Hyoscyamine Sulfate 0.125 Mg Tabs (Hyoscyamine sulfate) .... One by mouth three times a day as needed abdominal pain 11)  Multivitamins Tabs (Multiple vitamin) .... Take 1 tablet by mouth once a day 12)  Calcium-vitamin D 600-200 Mg-unit Tabs (Calcium-vitamin d) .... Take 1 tablet by mouth once a day 13)  Omega-3-6-9 Caps (Omega 3-6-9 fatty acids) .... Take 1 capsule by mouth once a day 14)  Humalog Mix 50/50 50-50 % Susp (Insulin lispro prot & lispro) .... Inject subcutaneously three times a day sliding scale  Patient Instructions: 1)  Please call our office if your ear irritation gets worse 2)  Our office will contact you re:  GYN referral Prescriptions: NEOMYCIN-POLYMYXIN-HC 3.5-10000-1 SOLN  (NEOMYCIN-POLYMYXIN-HC) 4 drops to both ears three times a day x 1 week  #1 week x 0   Entered and Authorized by:   [name] DO   Signed by:   [name] DO on 11/07/2010   Method used:   Electronically to        [facility] (retail)       [address]       [place], [place]  [address]       Ph: [phone]       Fax: [phone]   RxID:   [id number]    Orders Added: 1)  Gynecologic Referral [Gyn] 2)  Est. Patient Level III [02725]    Current Allergies (reviewed today): ! * ACTOS ! FLUCONAZOLE (FLUCONAZOLE) GLUCOPHAGE

## 2010-12-23 ENCOUNTER — Ambulatory Visit (INDEPENDENT_AMBULATORY_CARE_PROVIDER_SITE_OTHER): Payer: Medicare Other | Admitting: Internal Medicine

## 2010-12-23 ENCOUNTER — Encounter: Payer: Self-pay | Admitting: Internal Medicine

## 2010-12-23 DIAGNOSIS — R109 Unspecified abdominal pain: Secondary | ICD-10-CM

## 2010-12-23 LAB — CONVERTED CEMR LAB
Basophils Absolute: 0 10*3/uL (ref 0.0–0.1)
Bilirubin Urine: NEGATIVE
CO2: 24 meq/L (ref 19–32)
Chloride: 104 meq/L (ref 96–112)
Creatinine, Ser: 0.75 mg/dL (ref 0.40–1.20)
Eosinophils Relative: 2 % (ref 0–5)
HCT: 36.3 % (ref 36.0–46.0)
Hemoglobin: 11.9 g/dL — ABNORMAL LOW (ref 12.0–15.0)
Ketones, urine, test strip: NEGATIVE
Lymphocytes Relative: 27 % (ref 12–46)
Lymphs Abs: 2.3 10*3/uL (ref 0.7–4.0)
Neutro Abs: 5.6 10*3/uL (ref 1.7–7.7)
Nitrite: NEGATIVE
Platelets: 227 10*3/uL (ref 150–400)
Potassium: 4.3 meq/L (ref 3.5–5.3)
Sed Rate: 18 mm/hr (ref 0–22)
Urobilinogen, UA: 0.2
WBC: 8.6 10*3/uL (ref 4.0–10.5)

## 2010-12-24 ENCOUNTER — Telehealth: Payer: Self-pay | Admitting: Internal Medicine

## 2010-12-25 ENCOUNTER — Telehealth: Payer: Self-pay | Admitting: Internal Medicine

## 2010-12-25 NOTE — Progress Notes (Signed)
Summary: ear has not cleared up   Phone Note Call from Patient Call back at Home Phone [phone]   Caller: Patient Call For: Mccauley Diehl  Summary of Call: was in for a an itchy ear and Dr Artist Pais gave her a liquid to put in her ear.  She has used the liquid for one week and it did not work.  What should she try now.   Initial call taken by: Roselle Locus,  December 16, 2010 8:08 AM  Follow-up for Phone Call        I suggest ENT eval Follow-up by: [name] DO,  December 16, 2010 1:52 PM  Additional Follow-up for Phone Call Additional follow up Details #1::        call returned to patient at [phone], she has been informed per Dr Artist Pais instructions. She states she is scheduled already Additional Follow-up by: Glendell Docker CMA,  December 16, 2010 3:06 PM

## 2010-12-30 ENCOUNTER — Encounter: Payer: Self-pay | Admitting: Internal Medicine

## 2010-12-30 ENCOUNTER — Ambulatory Visit: Payer: Self-pay | Admitting: Pulmonary Disease

## 2010-12-30 ENCOUNTER — Telehealth: Payer: Self-pay | Admitting: Internal Medicine

## 2010-12-30 ENCOUNTER — Ambulatory Visit (INDEPENDENT_AMBULATORY_CARE_PROVIDER_SITE_OTHER): Payer: Medicare Other | Admitting: Internal Medicine

## 2010-12-30 DIAGNOSIS — R109 Unspecified abdominal pain: Secondary | ICD-10-CM

## 2010-12-30 DIAGNOSIS — M79609 Pain in unspecified limb: Secondary | ICD-10-CM

## 2010-12-31 ENCOUNTER — Encounter: Payer: Self-pay | Admitting: Internal Medicine

## 2011-01-02 NOTE — Progress Notes (Signed)
Summary: lab results, concern  Phone Note Outgoing Call   Summary of Call: call pt - cbc, sed rate, kidney function and electrolytes - normal Initial call taken by: [name] DO,  December 24, 2010 12:16 PM  Follow-up for Phone Call        Pt notified. States that she is still having pain and wants to know what you think it is coming from? Thinks it is a little worse than yesterday. Also requesting refill on HCTZ #90 to [facility]. Please advise. Follow-up by: [name] CMA Duncan Dull),  December 24, 2010 4:03 PM  Additional Follow-up for Phone Call Additional follow up Details #1::        concerns with pain have been addressed in 12/25/2010 phone note. Call placed to patient at [phone], to verify if she is still taking the HCTZ. Patient states she is taking the medication and has requested a 90 day supply [facility] for [facility]Z refilled  Additional Follow-up by: Glendell Docker CMA,  December 25, 2010 4:10 PM    Prescriptions: HYDROCHLOROTHIAZIDE 12.5 MG CAPS (HYDROCHLOROTHIAZIDE) one by mouth once daily  #90 x 0   Entered by:   Glendell Docker CMA   Authorized by:   [name] DO   Signed by:   Glendell Docker CMA on 12/25/2010   Method used:   Electronically to        [facility] (retail)       [address]       [place], [place]  [address]       Ph: [phone]       Fax: [phone]   RxID:   [id number]

## 2011-01-02 NOTE — Progress Notes (Signed)
Summary: Status Update  Phone Note Call from Patient   Caller: Patient Call For: darlene Reason for Call: Talk to Nurse Summary of Call: pt saw dr. Artist Pais on monday and med for pain is not working. she was wondering if something else could be called in or if she needs to come back in to see him. phone# [phone]. please assist. Initial call taken by: Elba Barman,  December 25, 2010 9:06 AM  Follow-up for Phone Call        call returned to patient at [phone]. Patient states she is  still having pain in her lower abdomen, that is worse when she is walking. She states the medication that was prescribed has not helped any. Follow-up by: Glendell Docker CMA,  December 25, 2010 10:59 AM  Additional Follow-up for Phone Call Additional follow up Details #1::        symptoms may be from strained abd muscles / groin muscles  try taking muscle relaxer  Additional Follow-up by: [name] DO,  December 25, 2010 12:10 PM    Additional Follow-up for Phone Call Additional follow up Details #2::    call returned to patient at [phone], no answer. A detailed voice message was left for patient informing her per Dr Artist Pais instructions. patient was advised to cal lback if any questions Follow-up by: Glendell Docker CMA,  December 25, 2010 3:11 PM  New/Updated Medications: CYCLOBENZAPRINE HCL 5 MG TABS (CYCLOBENZAPRINE HCL) one by mouth two times a day as needed Prescriptions: CYCLOBENZAPRINE HCL 5 MG TABS (CYCLOBENZAPRINE HCL) one by mouth two times a day as needed  #30 x 0   Entered and Authorized by:   [name] DO   Signed by:   [name] DO on 12/25/2010   Method used:   Electronically to        [facility] (retail)       [address]       [place], [place]  [address]       Ph: [phone]       Fax: [phone]   RxID:   [id number]   Appended Document: urine culture result    Phone Note Outgoing Call   Summary of Call:  call pt - urine culture positive for enterococcus.  cipro abx prescribed covers this abx.  pt make sure pt that full course Initial call taken by: [name] DO,  December 26, 2010 1:59 PM  Follow-up for Phone Call        Pt advised. Follow-up by: [name] CMA Duncan Dull),  December 26, 2010 2:28 PM

## 2011-01-02 NOTE — Letter (Signed)
Summary: CMN for CPAP Supplies/[facility]  CMN for CPAP Supplies/[facility]   Imported By: Sherian Rein 12/24/2010 08:55:42  _____________________________________________________________________  External Attachment:    Type:   Image     Comment:   External Document

## 2011-01-07 ENCOUNTER — Telehealth: Payer: Self-pay | Admitting: Pulmonary Disease

## 2011-01-08 NOTE — Progress Notes (Signed)
Summary: Lisinopril refill  Phone Note Refill Request Message from:  Fax from Pharmacy on December 30, 2010 12:26 PM  Refills Requested: Medication #1:  LISINOPRIL 40 MG  TABS Take 1 tablet by mouth once a day Fax came to [facility] location. This patient was seen earlier today.  Initial call taken by: Lucious Groves CMA,  December 30, 2010 12:26 PM    Prescriptions: LISINOPRIL 40 MG  TABS (LISINOPRIL) Take 1 tablet by mouth once a day  #30 x 3   Entered by:   Glendell Docker CMA   Authorized by:   [name] DO   Signed by:   Glendell Docker CMA on 12/30/2010   Method used:   Electronically to        [facility] (retail)       [address]       [place], [place]  [address]       Ph: [phone]       Fax: [phone]   RxID:   [id number]

## 2011-01-08 NOTE — Assessment & Plan Note (Signed)
Summary: PAIN IN SIDE/hea   Vital Signs:  Patient profile:   70 year old female Height:      68 inches Weight:      209 pounds BMI:     31.89 O2 Sat:      98 % on Room air Temp:     98.2 degrees F oral Pulse rate:   81 / minute Resp:     24 per minute BP sitting:   144 / 74  (right arm) Cuff size:   large  Vitals Entered By: Francee Piccolo CMA Duncan Dull) (December 23, 2010 1:19 PM)  O2 Flow:  Room air CC: pain LLQ since Friday, hurts to walk//needs 90-day refill on HCTZ//SP Is Patient Diabetic? Yes   Primary Care Provider:  [name] DO  CC:  pain LLQ since Friday and hurts to walk//needs 90-day refill on HCTZ//SP.  History of Present Illness: Left groin pain since friday got out the car and noticed pain in left groin worse with walking better is sitting severity rated 8 out of 10   no urinary complaints  no GI symptoms  no fever  pt previously experienced lower abd pain in 07/2010 CT of abd and pelvis:  Unremarkable noncontrasted CT scan of the abdomen and pelvis.  Current Medications (verified): 1)  Lisinopril 40 Mg  Tabs (Lisinopril) .... Take 1 Tablet By Mouth Once A Day 2)  Freestyle Test   Strp (Glucose Blood) .... Checks Bs 4x/day Dx 250.00 3)  Glucosamine-Chondroitin 1500-1200 Mg/49ml  Liqd (Glucosamine-Chondroitin) .... Take 1 Tablet By Mouth Two Times A Day 4)  Vitamin D3 1000000 Unit/gm Liqd (Cholecalciferol) .... 2,000 International Units (One Drop) By Mouth Once Daily At Bedtime 5)  Odorless Garlic 500 Mg Tabs (Garlic) .... Take 1 Capsule By Mouth Two Times A Day 6)  Hydrochlorothiazide 12.5 Mg Caps (Hydrochlorothiazide) .... One By Mouth Once Daily 7)  Pen Needles 1/2" 29g X 12mm Misc (Insulin Pen Needle) .... Use Three Times A Day As Directed 8)  Relion Pen Needles 31g X 8 Mm Misc (Insulin Pen Needle) .... Use As Directed 9)  Multivitamins  Tabs (Multiple Vitamin) .... Take 1 Tablet By Mouth Once A Day 10)  Calcium-Vitamin D 600-200 Mg-Unit  Tabs (Calcium-Vitamin D) .... Take 1 Tablet By Mouth Once A Day 11)  Omega-3-6-9  Caps (Omega 3-6-9 Fatty Acids) .... Take 1 Capsule By Mouth Once A Day 12)  Humalog Mix 50/50 50-50 % Susp (Insulin Lispro Prot & Lispro) .... Inject Subcutaneously Three Times A Day Sliding Scale 13)  Mometasone Furoate 0.1 % Crea (Mometasone Furoate) .... Apply Once Daily 14)  Neomycin-Polymyxin-Hc 3.5-10000-1 Soln (Neomycin-Polymyxin-Hc) .... Use Two Times A Day  Allergies (verified): 1)  ! * Actos 2)  ! Fluconazole (Fluconazole) 3)  Glucophage  Past History:  Past Medical History: h/o COLONIC POLYPS    CP 10-2009: cath negative  HYPERTENSION   DIABETES MELLITUS II  - uncontrolled   OSTEOARTHRITIS    GERD  Osteopenia Lupus of skin-- sees dermatology frequently  left ductal  Breast CA Dx 2003 aprox, s/p surgery, XRT x 6 weeks , released from oncology (per patient)   yearly mammograms OA C-spine R leg DVT 05-17-2008, to  d/c coumadin 11-09  L leg swelling 2-10, u/s showed a old clot , had a hypercoag. 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   Lungs:  normal respiratory effort and normal breath sounds.   Heart:  normal rate, regular rhythm, and no gallop.   Abdomen:  soft.  LLQ tenderness, no rebound or guarding Extremities:  No lower extremity edema   Impression & Recommendations:  Problem #  1:  ABDOMINAL PAIN, LOWER (ICD-789.09)  LLQ quad pain question colitis vs GU pain empiric tx with cipro and flagyl use daily miralax reassess in 1 week  Orders: T-Basic Metabolic Panel [phone]) T-CBC w/Diff [phone]) T-Sed Rate (Automated) [phone]) UA Dipstick w/o Micro (manual) ([id number]) Specimen Handling (99000) T-Culture, Urine ([id number])  Complete Medication List: 1)  Lisinopril 40 Mg Tabs (Lisinopril) .... Take 1 tablet by mouth once a day 2)  Freestyle Test Strp (Glucose blood) .... Checks bs 4x/day dx 250.00 3)  Glucosamine-chondroitin 1500-1200 Mg/60ml Liqd (Glucosamine-chondroitin) .... Take 1 tablet by mouth two times a day 4)  Vitamin D3 1000000 Unit/gm Liqd (Cholecalciferol) .... 2,000 international units (one drop) by mouth once daily at bedtime 5)  Odorless Garlic 500 Mg Tabs (Garlic) .... Take 1 capsule by mouth two times a day 6)  Hydrochlorothiazide 12.5 Mg Caps (Hydrochlorothiazide) .... One by mouth once daily 7)  Pen Needles 1/2" 29g X 12mm Misc (Insulin pen needle) .... Use three times a day as directed 8)  Relion Pen Needles 31g X 8 Mm Misc (Insulin pen needle) .... Use as directed 9)  Multivitamins Tabs (Multiple vitamin) .... Take 1 tablet by mouth once a day 10)  Calcium-vitamin D 600-200 Mg-unit Tabs (Calcium-vitamin d) .... Take 1 tablet by mouth once a day 11)  Omega-3-6-9 Caps (Omega 3-6-9 fatty acids) .... Take 1 capsule by mouth once a day 12)  Humalog Mix 50/50 50-50 % Susp (Insulin lispro prot & lispro) .... Inject subcutaneously three times a day sliding scale 13)  Mometasone Furoate 0.1 % Crea (Mometasone furoate) .... Apply once daily 14)  Neomycin-polymyxin-hc 3.5-10000-1 Soln (Neomycin-polymyxin-hc) .... Use two times a day 15)  Ciprofloxacin Hcl 500 Mg Tabs (Ciprofloxacin hcl) .... One by mouth two times a day 16)  Metronidazole 500 Mg Tabs (Metronidazole) .... One by mouth three times a day 17)  Polyethylene Glycol 3350 Powd  (Polyethylene glycol 3350) .Marland Kitchen.. 1 capful by mouth once daily (mixed with 8 oz of water)  Patient Instructions: 1)  Please schedule a follow-up appointment in 1 week Prescriptions: POLYETHYLENE GLYCOL 3350  POWD (POLYETHYLENE GLYCOL 3350) 1 capful by mouth once daily (mixed with 8 oz of water)  #1 month x 1   Entered and Authorized by:   [name] DO   Signed by:   [name] DO on 12/23/2010   Method used:   Electronically to        [facility] (retail)       [address]       [place], [place]  [address]  Ph: [phone]       Fax: [phone]   RxID:   [id number] METRONIDAZOLE 500 MG TABS (METRONIDAZOLE) one by mouth three times a day  #21 x 0   Entered and Authorized by:   [name] DO   Signed by:   [name] DO on 12/23/2010   Method used:   Electronically to        [facility] (retail)       [address]       [place], [place]  [address]       Ph: [phone]       Fax: [phone]   RxID:   [id number] CIPROFLOXACIN HCL 500 MG TABS (CIPROFLOXACIN HCL) one by mouth two times a day  #14 x 0   Entered and Authorized by:   [name] DO   Signed by:   [name] DO on 12/23/2010   Method used:   Electronically to        [facility] (retail)       [address]       [place], [place]  [address]       Ph: [phone]       Fax: [phone]   RxID:   [id number]    Orders Added: 1)  T-Basic Metabolic Panel [phone] 2)  T-CBC w/Diff [[id number]] 3)  T-Sed Rate (Automated) [[id number]] 4)  UA Dipstick w/o Micro (manual) [81002] 5)  Specimen Handling [99000] 6)  T-Culture, Urine [[id number]] 7)  Est. Patient Level III [99213]    Laboratory Results   Urine Tests  Date/Time Received: December 23, 2010 2:05 PM Date/Time Reported: December 23, 2010 2:05 PM  Routine Urinalysis     Color: yellow Appearance: Clear Glucose: negative   (Normal Range: Negative) Bilirubin: negative   (Normal Range: Negative) Ketone: negative   (Normal Range: Negative) Spec. Gravity: 1.010   (Normal Range: 1.003-1.035) Blood: trace-intact   (Normal Range: Negative) pH: 6.0   (Normal Range: 5.0-8.0) Protein: trace   (Normal Range: Negative) Urobilinogen: 0.2   (Normal Range: 0-1) Nitrite: negative   (Normal Range: Negative) Leukocyte Esterace: negative   (Normal Range: Negative)

## 2011-01-14 NOTE — Letter (Signed)
Summary: [facility]   Imported By: Maryln Gottron 01/09/2011 13:38:28  _____________________________________________________________________  External Attachment:    Type:   Image     Comment:   External Document

## 2011-01-14 NOTE — Progress Notes (Signed)
Summary: patient states she had a message for Dr Carlena Sax office re CPAP  Phone Note Call from Patient   Caller: Patient Call For: [name] Summary of Call: patient phoned stated that she canceled her appointment for 12/30/10 becasue she sent CPAP on 11/29/2010 because she didnt like the machine and it did not make her feel any better. She stated that there was a message on her machine stating that Dr Vassie Loll had ordered some supplies for her CPAP she is confused by the message because she only used it a couple of times and she no longer has it. she doesnt want the company or Medicare charging her for this machine because she sent it back on 11/29/10. Patient stated that the message said that it was Dr Carlena Sax office calling she can be reached at [phone]  Initial call taken by: Vedia Coffer,  January 07, 2011 1:29 PM  Follow-up for Phone Call        I called and spoke with pt.  She states that "some nurse" from our office called and LM for her last wk stating that she needed to come in for followup with RA.  I see no record of this in her chart.  She was last seen in Sept, and d/c'ed CPAP b/c she just did not like it.  Shanda Bumps, did you call her by any chance or know if RA needs her to followup.  Pls advise thanks Follow-up by: Vernie Murders,  January 07, 2011 2:46 PM  Additional Follow-up for Phone Call Additional follow up Details #1::        Pt states she turned her machine Nov 29, 2010. Pt states she does not like CPAP. She does not know why she needs to follow up with RA but if she does she can only come to the [facility] location in the AM. Dr. Vassie Loll please advise. Thanks. Zackery Barefoot CMA  January 08, 2011 5:27 PM     Additional Follow-up for Phone Call Additional follow up Details #2::    she can FU ONly if she wants to discuss other options for obstructive sleep apnea. Otherwise she can continue her FU with dr Artist Pais. Follow-up by: Comer Locket Vassie Loll MD,  January 08, 2011 8:59 PM  Additional Follow-up  for Phone Call Additional follow up Details #3:: Details for Additional Follow-up Action Taken: Pt informed of above. Pt states she does not know what else there is to discuss. She did not like the CPAP machine and felt it did not help her. Pt will call us if she feels she needs [place]. Zackery Barefoot CMA  January 09, 2011 9:00 AM

## 2011-01-23 NOTE — Assessment & Plan Note (Signed)
Summary: 1 WEEK FOLLOW UP/MHF   Vital Signs:  Patient profile:   70 year old female Height:      68 inches Weight:      204.75 pounds BMI:     31.24 O2 Sat:      97 % on Room air Temp:     97.8 degrees F oral Pulse rate:   94 / minute Resp:     18 per minute BP sitting:   110 / 58  (left arm) Cuff size:   large  Vitals Entered By: Glendell Docker CMA (December 30, 2010 9:08 AM)  O2 Flow:  Room air CC: 1 Week follow up Is Patient Diabetic? Yes Did you bring your meter with you today? No Pain Assessment Patient in pain? no        Primary Care Provider:  [name] DO  CC:  1 Week follow up.  History of Present Illness: he dropped a board on her  left foot  about two weeks ago her  toe is now slightly red, denies pain mild improvement with applying triple abx ointment  LLQ pain improved with treatment of possible UTI  she is to complete anitbiotics tomorrow reviewed urine culture results    Preventive Screening-Counseling & Management  Alcohol-Tobacco     Smoking Status: never  Allergies: 1)  ! * Actos 2)  ! 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 panel, f/u by Hematology 10-09 had CP, stress test  neg, likely GI (GERD) 09-2006, had CP: stress test  (-)   Past Surgical History: Cholecystectomy   Left breast biopsy   Left breast lumpectomy-ductal carcinoma in situ Appendectomy      Family History: MI--no DM-- M, siblings Colon ca--no breast ca--no           Social  History: Single-widow  3 children , 1 in [place] (son, daughter - [place])  [place] to [place] 2002 from [place], [place] Alcohol Use - no    tobacco-- never   Illicit Drug Use - no  Pt is Jehovah's Witness-No blood products Daughter - Shelsy Seng   Physical Exam  General:  alert and well-developed.   Lungs:  normal respiratory effort and normal breath sounds.   Heart:  normal rate, regular rhythm, and no gallop.   Skin:  mild redness    Impression & Recommendations:  Problem # 1:  ABDOMINAL PAIN, LOWER (ICD-789.09) Assessment Improved improved with tx of possible UTI  Her updated medication list for this problem includes:    Cyclobenzaprine Hcl 5 Mg Tabs (Cyclobenzaprine hcl) ..... One by mouth two times a day as needed  Problem # 2:  FOOT PAIN, LEFT (ICD-729.5) continue local care use sitz bath Patient advised to call office if symptoms persist or worsen.  Complete Medication List: 1)  Lisinopril 40 Mg Tabs (Lisinopril) .... Take 1 tablet by mouth once a day 2)  Freestyle Test Strp (Glucose blood) .... Checks bs 4x/day dx 250.00 3)  Glucosamine-chondroitin 1500-1200 Mg/60ml Liqd (Glucosamine-chondroitin) .... Take 1 tablet by mouth two times a day 4)  Vitamin D3 1000000 Unit/gm Liqd (Cholecalciferol) .... 2,000 international units (one drop) by mouth once daily at bedtime 5)  Odorless Garlic 500 Mg Tabs (Garlic) .... Take 1 capsule by mouth two times a day 6)  Hydrochlorothiazide 12.5 Mg Caps (Hydrochlorothiazide) .... One by mouth once daily 7)  Pen Needles 1/2" 29g X 12mm Misc (Insulin pen needle) .... Use three times a day as directed 8)  Relion Pen Needles 31g X 8 Mm Misc (Insulin pen needle) .... Use as directed 9)  Multivitamins Tabs (Multiple vitamin) .... Take 1 tablet by mouth once a day 10)  Calcium-vitamin D 600-200 Mg-unit Tabs (Calcium-vitamin d) .... Take 1 tablet by mouth once a day 11)  Omega-3-6-9 Caps (Omega 3-6-9 fatty acids) .... Take 1 capsule by mouth once  a day 12)  Humalog Mix 50/50 50-50 % Susp (Insulin lispro prot & lispro) .... Inject subcutaneously three times a day sliding scale 13)  Mometasone Furoate 0.1 % Crea (Mometasone furoate) .... Apply once daily 14)  Neomycin-polymyxin-hc 3.5-10000-1 Soln (Neomycin-polymyxin-hc) .... Use two times a day 15)  Ciprofloxacin Hcl 500 Mg Tabs (Ciprofloxacin hcl) .... One by mouth two times a day 16)  Metronidazole 500 Mg Tabs (Metronidazole) .... One by mouth three times a day 17)  Polyethylene Glycol 3350 Powd (Polyethylene glycol 3350) .Marland Kitchen.. 1 capful by mouth once daily (mixed with 8 oz of water) 18)  Cyclobenzaprine Hcl 5 Mg Tabs (Cyclobenzaprine hcl) .... One by mouth two times a day as needed  Patient Instructions: 1)  Please schedule a follow-up appointment in 3 months.   Orders Added: 1)  Est. Patient Level III [98119]    Current Allergies (reviewed today): ! * ACTOS ! FLUCONAZOLE (FLUCONAZOLE) GLUCOPHAGE

## 2011-02-05 ENCOUNTER — Encounter: Payer: Self-pay | Admitting: Internal Medicine

## 2011-02-13 NOTE — Letter (Signed)
Summary: Colonoscopy Letter  [facility]  [address] [place], [place] [address]   Phone: [phone]  Fax: [phone]      February 05, 2011 MRN: [id number]   [name] [address] [place], [place]  [address]   Dear Ms. [name],   According to your medical record, it is time for you to schedule a Colonoscopy. The American Cancer Society recommends this procedure as a method to detect early colon cancer. Patients with a family history of colon cancer, or a personal history of colon polyps or inflammatory bowel disease are at increased risk.  This letter has been generated based on the recommendations made at the time of your procedure. If you feel that in your particular situation this may no longer apply, please contact our office.  Please call our office at [phone] to schedule this appointment or to update your records at your earliest convenience.  Thank you for cooperating with [place] to provide you with the very best care possible.   Sincerely,  Hedwig Morton. Juanda Chance, M.D.  [facility] [phone]

## 2011-02-14 ENCOUNTER — Telehealth: Payer: Self-pay | Admitting: *Deleted

## 2011-02-14 NOTE — Telephone Encounter (Signed)
Onset Wednesday night she had a cough, and then she had a sudden sharp pain on the right side of her right head. She states the pain is intermittent with bending down, and occurs random. She also states that is occurs sporadically. She would like to know what Dr Artist Pais advises

## 2011-02-14 NOTE — Telephone Encounter (Signed)
I suggest OV. 

## 2011-02-14 NOTE — Telephone Encounter (Signed)
Call was returned to patient she was informed per Dr Artist Pais instructions, and advised to call back Monday for 11a appointment. Patient was advised if she was worse before appointment, then she would need to seek care in emergency room. Patient verbalized understanding and agrees as instructed.

## 2011-02-15 ENCOUNTER — Emergency Department (HOSPITAL_BASED_OUTPATIENT_CLINIC_OR_DEPARTMENT_OTHER)
Admission: EM | Admit: 2011-02-15 | Discharge: 2011-02-16 | Disposition: A | Discharge status: Home / Self Care | Payer: Medicare Other | Attending: Emergency Medicine | Admitting: Emergency Medicine

## 2011-02-15 ENCOUNTER — Emergency Department (INDEPENDENT_AMBULATORY_CARE_PROVIDER_SITE_OTHER): Payer: Medicare Other

## 2011-02-15 DIAGNOSIS — Z853 Personal history of malignant neoplasm of breast: Secondary | ICD-10-CM | POA: Insufficient documentation

## 2011-02-15 DIAGNOSIS — R05 Cough: Secondary | ICD-10-CM

## 2011-02-15 DIAGNOSIS — E785 Hyperlipidemia, unspecified: Secondary | ICD-10-CM | POA: Insufficient documentation

## 2011-02-15 DIAGNOSIS — R51 Headache: Secondary | ICD-10-CM | POA: Insufficient documentation

## 2011-02-15 DIAGNOSIS — Z86718 Personal history of other venous thrombosis and embolism: Secondary | ICD-10-CM | POA: Insufficient documentation

## 2011-02-15 DIAGNOSIS — I10 Essential (primary) hypertension: Secondary | ICD-10-CM | POA: Insufficient documentation

## 2011-02-15 DIAGNOSIS — E119 Type 2 diabetes mellitus without complications: Secondary | ICD-10-CM | POA: Insufficient documentation

## 2011-02-15 LAB — GLUCOSE, CAPILLARY: Glucose-Capillary: 246 mg/dL — ABNORMAL HIGH (ref 70–99)

## 2011-02-17 ENCOUNTER — Telehealth: Payer: Self-pay | Admitting: Internal Medicine

## 2011-02-17 LAB — GLUCOSE, CAPILLARY
Glucose-Capillary: 284 mg/dL — ABNORMAL HIGH (ref 70–99)
Glucose-Capillary: 290 mg/dL — ABNORMAL HIGH (ref 70–99)
Glucose-Capillary: 333 mg/dL — ABNORMAL HIGH (ref 70–99)
Glucose-Capillary: 376 mg/dL — ABNORMAL HIGH (ref 70–99)
Glucose-Capillary: 75 mg/dL (ref 70–99)
Glucose-Capillary: 77 mg/dL (ref 70–99)
Glucose-Capillary: 87 mg/dL (ref 70–99)

## 2011-02-17 LAB — SEDIMENTATION RATE: Sed Rate: 35 mm/hr — ABNORMAL HIGH (ref 0–22)

## 2011-02-17 LAB — BASIC METABOLIC PANEL
BUN: 10 mg/dL (ref 6–23)
BUN: 14 mg/dL (ref 6–23)
CO2: 24 mEq/L (ref 19–32)
CO2: 28 mEq/L (ref 19–32)
Calcium: 8.7 mg/dL (ref 8.4–10.5)
Calcium: 10.1 mg/dL (ref 8.4–10.5)
Chloride: 107 mEq/L (ref 96–112)
Creatinine, Ser: 0.75 mg/dL (ref 0.4–1.2)
Creatinine, Ser: 0.7 mg/dL (ref 0.4–1.2)
GFR calc Af Amer: >60 mL/min (ref >60)
GFR calc non Af Amer: >60 mL/min (ref >60)
GFR calc non Af Amer: >60 mL/min (ref >60)
Glucose, Bld: 171 mg/dL — ABNORMAL HIGH (ref 70–99)
Glucose, Bld: 130 mg/dL — ABNORMAL HIGH (ref 70–99)
Potassium: 3.9 mEq/L (ref 3.5–5.1)
Potassium: 4.1 mEq/L (ref 3.5–5.1)

## 2011-02-17 LAB — DIFFERENTIAL
Basophils Absolute: 0 10*3/uL (ref 0.0–0.1)
Basophils Relative: 1 % (ref 0–1)
Neutro Abs: 6.7 10*3/uL (ref 1.7–7.7)
Neutrophils Relative %: 70 % (ref 43–77)

## 2011-02-17 LAB — CBC
HCT: 34.4 % — ABNORMAL LOW (ref 36.0–46.0)
Hemoglobin: 11.5 g/dL — ABNORMAL LOW (ref 12.0–15.0)
MCHC: 33.5 g/dL (ref 30.0–36.0)
MCHC: 33.5 g/dL (ref 30.0–36.0)
MCV: 85.2 fL (ref 78.0–100.0)
Platelets: 147 10*3/uL — ABNORMAL LOW (ref 150–400)
Platelets: 174 10*3/uL (ref 150–400)
RDW: 13.2 % (ref 11.5–15.5)
RDW: 12.6 % (ref 11.5–15.5)

## 2011-02-17 LAB — POCT CARDIAC MARKERS
CKMB, poc: 1 ng/mL (ref 1.0–8.0)
CKMB, poc: 1 ng/mL (ref 1.0–8.0)
CKMB, poc: 1.2 ng/mL (ref 1.0–8.0)
Myoglobin, poc: 61.6 ng/mL (ref 12–200)
Myoglobin, poc: 71.4 ng/mL (ref 12–200)

## 2011-02-17 LAB — CARDIAC PANEL(CRET KIN+CKTOT+MB+TROPI)
CK, MB: 1.2 ng/mL (ref 0.3–4.0)
CK, MB: 1.6 ng/mL (ref 0.3–4.0)
Total CK: 51 U/L (ref 7–177)
Total CK: 59 U/L (ref 7–177)
Troponin I: 0.01 ng/mL (ref 0.00–0.06)
Troponin I: 0.01 ng/mL (ref 0.00–0.06)

## 2011-02-17 LAB — LIPID PANEL
Triglycerides: 201 mg/dL — ABNORMAL HIGH (ref <150)
VLDL: 40 mg/dL (ref 0–40)

## 2011-02-17 LAB — PROTIME-INR
INR: 1.1 (ref 0.00–1.49)
Prothrombin Time: 14.1 seconds (ref 11.6–15.2)

## 2011-02-17 NOTE — Telephone Encounter (Signed)
Went to ed on Saturday night.  They did a CT scan.  They said everything is ok.  They said she probably should see a chiropractor.

## 2011-03-04 LAB — CBC
Hemoglobin: 13.1 g/dL (ref 12.0–15.0)
MCHC: 33.1 g/dL (ref 30.0–36.0)
MCV: 84 fL (ref 78.0–100.0)
RDW: 12.7 % (ref 11.5–15.5)

## 2011-03-04 LAB — CARDIAC PANEL(CRET KIN+CKTOT+MB+TROPI)
CK, MB: 3.2 ng/mL (ref 0.3–4.0)
Relative Index: 2.3 (ref 0.0–2.5)
Relative Index: 2.6 — ABNORMAL HIGH (ref 0.0–2.5)
Total CK: 132 U/L (ref 7–177)
Total CK: 138 U/L (ref 7–177)
Troponin I: <0.01 ng/mL (ref 0.00–0.06)
Troponin I: <0.01 ng/mL (ref 0.00–0.06)

## 2011-03-04 LAB — URINE CULTURE

## 2011-03-04 LAB — POCT CARDIAC MARKERS
CKMB, poc: 1.2 ng/mL (ref 1.0–8.0)
CKMB, poc: 1.5 ng/mL (ref 1.0–8.0)
Myoglobin, poc: 95.9 ng/mL (ref 12–200)
Troponin i, poc: <0.05 ng/mL (ref 0.00–0.09)

## 2011-03-04 LAB — GLUCOSE, CAPILLARY
Glucose-Capillary: 235 mg/dL — ABNORMAL HIGH (ref 70–99)
Glucose-Capillary: 238 mg/dL — ABNORMAL HIGH (ref 70–99)

## 2011-03-04 LAB — URINALYSIS, ROUTINE W REFLEX MICROSCOPIC
Ketones, ur: 15 mg/dL — AB
Nitrite: NEGATIVE
Specific Gravity, Urine: 1.025 (ref 1.005–1.030)
Urobilinogen, UA: 0.2 mg/dL (ref 0.0–1.0)
pH: 6.5 (ref 5.0–8.0)

## 2011-03-04 LAB — D-DIMER, QUANTITATIVE: D-Dimer, Quant: 3.84 ug/mL-FEU — ABNORMAL HIGH (ref 0.00–0.48)

## 2011-03-04 LAB — BETA-2-GLYCOPROTEIN I ABS, IGG/M/A: Beta-2 Glyco I IgG: <4 U/mL (ref <20)

## 2011-03-04 LAB — APTT: aPTT: 32 seconds (ref 24–37)

## 2011-03-04 LAB — PROTIME-INR
INR: 1 (ref 0.00–1.49)
Prothrombin Time: 13.1 seconds (ref 11.6–15.2)

## 2011-03-04 LAB — LUPUS ANTICOAGULANT PANEL
DRVVT: 54.5 secs — ABNORMAL HIGH (ref 36.1–47.0)
PTT Lupus Anticoagulant: 65.5 secs — ABNORMAL HIGH (ref 36.3–48.8)
PTTLA 4:1 Mix: 57.8 secs — ABNORMAL HIGH (ref 36.3–48.8)
PTTLA Confirmation: 0.3 secs (ref <8.0)

## 2011-03-04 LAB — WET PREP, GENITAL
Clue Cells Wet Prep HPF POC: NONE SEEN
Trich, Wet Prep: NONE SEEN
Yeast Wet Prep HPF POC: NONE SEEN

## 2011-03-04 LAB — COMPREHENSIVE METABOLIC PANEL
ALT: 22 U/L (ref 0–35)
CO2: 27 mEq/L (ref 19–32)
Calcium: 9.5 mg/dL (ref 8.4–10.5)
Creatinine, Ser: 1.4 mg/dL — ABNORMAL HIGH (ref 0.4–1.2)
GFR calc non Af Amer: 37 mL/min — ABNORMAL LOW (ref >60)
Glucose, Bld: 272 mg/dL — ABNORMAL HIGH (ref 70–99)
Sodium: 139 mEq/L (ref 135–145)
Total Protein: 7.6 g/dL (ref 6.0–8.3)

## 2011-03-04 LAB — URINE MICROSCOPIC-ADD ON

## 2011-03-04 LAB — CARDIOLIPIN ANTIBODIES, IGG, IGM, IGA
Anticardiolipin IgG: <7 [GPL'U] — ABNORMAL LOW (ref <11)
Anticardiolipin IgM: 16 [MPL'U] (ref <10)

## 2011-03-04 LAB — CK TOTAL AND CKMB (NOT AT ARMC): Relative Index: 2 (ref 0.0–2.5)

## 2011-03-04 LAB — LIPID PANEL
Cholesterol: 93 mg/dL (ref 0–200)
LDL Cholesterol: 47 mg/dL (ref 0–99)
Total CHOL/HDL Ratio: 4.7 RATIO

## 2011-03-04 LAB — DIFFERENTIAL
Eosinophils Absolute: 0.1 10*3/uL (ref 0.0–0.7)
Lymphocytes Relative: 17 % (ref 12–46)
Lymphs Abs: 1.8 10*3/uL (ref 0.7–4.0)
Monocytes Relative: 6 % (ref 3–12)
Neutro Abs: 8 10*3/uL — ABNORMAL HIGH (ref 1.7–7.7)
Neutrophils Relative %: 76 % (ref 43–77)

## 2011-03-04 LAB — PROTHROMBIN GENE MUTATION

## 2011-03-04 LAB — HOMOCYSTEINE: Homocysteine: 6.9 umol/L (ref 4.0–15.4)

## 2011-03-04 LAB — BRAIN NATRIURETIC PEPTIDE: Pro B Natriuretic peptide (BNP): 81 pg/mL (ref 0.0–100.0)

## 2011-03-04 LAB — PROTEIN C ACTIVITY: Protein C Activity: 156 % — ABNORMAL HIGH (ref 75–133)

## 2011-03-10 ENCOUNTER — Encounter: Payer: Self-pay | Admitting: Internal Medicine

## 2011-03-10 ENCOUNTER — Ambulatory Visit (INDEPENDENT_AMBULATORY_CARE_PROVIDER_SITE_OTHER): Payer: Medicare Other | Admitting: Internal Medicine

## 2011-03-10 DIAGNOSIS — M542 Cervicalgia: Secondary | ICD-10-CM

## 2011-03-10 DIAGNOSIS — G8929 Other chronic pain: Secondary | ICD-10-CM

## 2011-03-10 DIAGNOSIS — R51 Headache: Secondary | ICD-10-CM

## 2011-03-10 LAB — HM DIABETES EYE EXAM

## 2011-03-10 MED ORDER — CYCLOBENZAPRINE HCL 5 MG PO TABS: 5.0000 mg | ORAL_TABLET | Freq: Two times a day (BID) | ORAL | Status: DC | PRN

## 2011-03-10 NOTE — Assessment & Plan Note (Signed)
70 year old female with right occipital headache. I suspect musculoskeletal cause. Previous workup for intracranial lesion or aneurysm negative in December, 2010. Use muscle relaxers as needed Refer to physical therapy for treatment Use cool and warm compress as directed. If refractory symptoms, consider occipital nerve block by neurologist

## 2011-03-10 NOTE — Progress Notes (Signed)
Subjective:    Patient ID: [name], female    DOB: [birth year], 70 y.o.   MRN: [id number]  HPI  70 y/o white female for ER follow up  Pt seen in Mar, 2012 for acute headache.  Pt reports headache worse with bending forward or sneezing.  She denies nausea or photophobia.  No fever or chills.  CT of head was negative.  She has had similar headaches in the past.  She was seen by neurologist at [facility] December 13, at 2010. Previous workup included MRI and MRA of brain (negative).  Dr. [name] was considering occipital nerve block if symtoms got worse.  At the suggestion of your physician, patient was seen by a chiropractor who performed massage therapy. This did not seem to change headache severity or frequency  Review of Systems Negative for visual change.  No fever or chills    Past Medical History  Diagnosis Date  . History of colonic polyps   . Hypertension   . Diabetes mellitus type II   . Osteoarthritis   . GERD (gastroesophageal reflux disease)   . Osteopenia   . Lupus     of skin see dermatologist frequently  . 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 OA (osteoarthritis of spine)     C-spine  . DVT (deep venous thrombosis)     right leg DVT 05-17-2008, to d/c coumadin 11/09  . Leg swelling     left leg 2-10, u/s showed a old clot, has a hypercoag panel, f/u by hematology  . History of cardiovascular stress test     10/09 had CP, stress test neg, likely GI (GERD)-09/2006-had CP: stress test (-)    History   Social History  . Marital Status: Widowed    Spouse Name: N/A    Number of Children: N/A  . Years of Education: N/A   Occupational History  . Not on file.   Social History Main Topics  . Smoking status: Never Smoker   . Smokeless tobacco: Not on file  . Alcohol Use: No  . Drug Use: No  . Sexually Active: Not on file   Other Topics Concern  . Not on file   Social History  Narrative   Single-widow 3 children , 1 in [place] (son, daughter - [place]) [place] to [place] 2002 from [place], [employer] Use - no   tobacco-- never  Illicit Drug Use - no Pt is Jehovah's Witness-No blood productsDaughter - [name]     Past Surgical History  Procedure Date  . Cholecystectomy   . Breast biopsy     left breast  . Breast lumpectomy     left breast-ductal carcinoma in situ  . Appendectomy     Family History  Problem Relation Age of Onset  . Diabetes Mother   . Diabetes      siblings  . Other      NO MI, colon ca, breast ca    Allergies  Allergen Reactions  . Fluconazole     REACTION: severe fatigue and muscle weakness  . Metformin     REACTION: gi sxs  . Pioglitazone     REACTION: wt gain    No current outpatient prescriptions on file prior to visit.    BP 150/80  Pulse 62  Temp(Src) 98 F (36.7 C) (Oral)  Resp 18  Wt 208 lb (94.348 kg)  SpO2 99%    Objective:   Physical Exam  Constitutional: She is oriented to person, place, and time. She appears well-developed and well-nourished. No distress.  HENT:  Head: Normocephalic and atraumatic.  Right Ear: External ear normal.  Left Ear: External ear normal.       Right temporal area is nontender, tenderness of right occipital area,  right cervical paraspinal muscles  tense and mildly tender  Neck:       Decreased range of motion of cervical spine  Cardiovascular: Regular rhythm and normal heart sounds.   Pulmonary/Chest: Effort normal and breath sounds normal. No respiratory distress. She has no wheezes. She has no rales.  Musculoskeletal: She exhibits no edema and no tenderness.  Neurological: She is oriented to person, place, and time. She has normal reflexes. She displays normal reflexes. No cranial nerve deficit. She exhibits normal muscle tone. Coordination normal.  Skin: Skin is warm and dry.  Psychiatric: She has a normal mood and affect. Her behavior is normal.           Assessment & Plan:

## 2011-03-10 NOTE — Patient Instructions (Signed)
Use muscle relaxer as needed Perform stretching exercises as directed Please call our office if your symptoms do not improve or gets worse.

## 2011-03-19 ENCOUNTER — Emergency Department (HOSPITAL_BASED_OUTPATIENT_CLINIC_OR_DEPARTMENT_OTHER)
Admission: EM | Admit: 2011-03-19 | Discharge: 2011-03-19 | Disposition: A | Discharge status: Home / Self Care | Payer: Medicare Other | Attending: Emergency Medicine | Admitting: Emergency Medicine

## 2011-03-19 DIAGNOSIS — Z853 Personal history of malignant neoplasm of breast: Secondary | ICD-10-CM | POA: Insufficient documentation

## 2011-03-19 DIAGNOSIS — L02419 Cutaneous abscess of limb, unspecified: Secondary | ICD-10-CM | POA: Insufficient documentation

## 2011-03-19 DIAGNOSIS — E785 Hyperlipidemia, unspecified: Secondary | ICD-10-CM | POA: Insufficient documentation

## 2011-03-19 DIAGNOSIS — I10 Essential (primary) hypertension: Secondary | ICD-10-CM | POA: Insufficient documentation

## 2011-03-19 DIAGNOSIS — Z86718 Personal history of other venous thrombosis and embolism: Secondary | ICD-10-CM | POA: Insufficient documentation

## 2011-03-19 DIAGNOSIS — E119 Type 2 diabetes mellitus without complications: Secondary | ICD-10-CM | POA: Insufficient documentation

## 2011-03-21 ENCOUNTER — Ambulatory Visit (INDEPENDENT_AMBULATORY_CARE_PROVIDER_SITE_OTHER): Payer: Medicare Other | Admitting: Internal Medicine

## 2011-03-21 ENCOUNTER — Ambulatory Visit (HOSPITAL_BASED_OUTPATIENT_CLINIC_OR_DEPARTMENT_OTHER)
Admission: RE | Admit: 2011-03-21 | Discharge: 2011-03-21 | Disposition: A | Discharge status: Home / Self Care | Payer: Medicare Other | Source: Ambulatory Visit | Attending: Internal Medicine | Admitting: Internal Medicine

## 2011-03-21 ENCOUNTER — Encounter: Payer: Self-pay | Admitting: Internal Medicine

## 2011-03-21 VITALS — BP 108/58 | HR 79 | Temp 98.3°F | Resp 20 | Wt 206.0 lb

## 2011-03-21 DIAGNOSIS — R609 Edema, unspecified: Secondary | ICD-10-CM

## 2011-03-21 DIAGNOSIS — I82819 Embolism and thrombosis of superficial veins of unspecified lower extremities: Secondary | ICD-10-CM | POA: Insufficient documentation

## 2011-03-21 DIAGNOSIS — M79609 Pain in unspecified limb: Secondary | ICD-10-CM

## 2011-03-21 DIAGNOSIS — I809 Phlebitis and thrombophlebitis of unspecified site: Secondary | ICD-10-CM

## 2011-03-21 DIAGNOSIS — R600 Localized edema: Secondary | ICD-10-CM

## 2011-03-21 MED ORDER — ENOXAPARIN SODIUM 40 MG/0.4ML ~~LOC~~ SOLN: 40.0000 mg | SUBCUTANEOUS | Status: DC

## 2011-03-21 MED ORDER — SULFAMETHOXAZOLE-TMP DS 800-160 MG PO TABS: 1.0000 | ORAL_TABLET | Freq: Two times a day (BID) | ORAL | Status: AC

## 2011-03-21 NOTE — Assessment & Plan Note (Signed)
Pt recently seen in ER for presumed left lower ext cellulitis.  No significant improvement with doxy.   LE ultrasound shows superficial thrombophlebitis. Treat with lovenox 40 mg once daily and compression stockings Reassess in 1 week.  I recommend pt still take bactrim as directed. Patient advised to call office if symptoms persist or worsen.

## 2011-03-21 NOTE — Patient Instructions (Signed)
Elevate your legs twice daily as directed Please take complete course of antibiotics as directed Please call our office if your symptoms do not improve or gets worse.

## 2011-03-21 NOTE — Progress Notes (Signed)
Subjective:    Patient ID: [name], female    DOB: 30-Aug-1941, 70 y.o.   MRN: [id number]  HPI 70 y/o female for ER follow up.  Pt seen for left leg redness and tenderness.  Her symptoms presumed secondary to cellulitis.  She started abx but she notes little improvement. She has hx of DVT.   Review of Systems    negative for chest pain or shortness of breath  Past Medical History  Diagnosis Date  . History of colonic polyps   . Hypertension   . Diabetes mellitus type II   . Osteoarthritis   . GERD (gastroesophageal reflux disease)   . Osteopenia   . Lupus     of skin see dermatologist frequently  . 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 OA (osteoarthritis of spine)     C-spine  . DVT (deep venous thrombosis)     right leg DVT 05-17-2008, to d/c coumadin 11/09  . Leg swelling     left leg 2-10, u/s showed a old clot, has a hypercoag panel, f/u by hematology  . History of cardiovascular stress test     10/09 had CP, stress test neg, likely GI (GERD)-09/2006-had CP: stress test (-)    History   Social History  . Marital Status: Widowed    Spouse Name: N/A    Number of Children: N/A  . Years of Education: N/A   Occupational History  . Not on file.   Social History Main Topics  . Smoking status: Never Smoker   . Smokeless tobacco: Not on file  . Alcohol Use: No  . Drug Use: No  . Sexually Active: Not on file   Other Topics Concern  . Not on file   Social History Narrative   Single-widow 3 children , 1 in [place] (son, daughter - [place]) [place] to [place] 2002 from [place], [employer] Use - no   tobacco-- never  Illicit Drug Use - no Pt is Jehovah's Witness-No blood productsDaughter - [name]     Past Surgical History  Procedure Date  . Cholecystectomy   . Breast biopsy     left breast  . Breast lumpectomy     left breast-ductal carcinoma in situ  . Appendectomy      Family History  Problem Relation Age of Onset  . Diabetes Mother   . Diabetes      siblings  . Other      NO MI, colon ca, breast ca    Allergies  Allergen Reactions  . Fluconazole     REACTION: severe fatigue and muscle weakness  . Metformin     REACTION: gi sxs  . Pioglitazone     REACTION: wt gain    Current Outpatient Prescriptions on File Prior to Visit  Medication Sig Dispense Refill  . Calcium Carbonate-Vit D-Min (CALCIUM 600 + MINERALS) 600-200 MG-UNIT TABS Take by mouth.        . Cholecalciferol (VITAMIN D3) 1000000 UNIT/GM LIQD Take 2,000 Units by mouth. 2,000 international untis (one drop) by mouth once daily at bedtime       . cyclobenzaprine (FLEXERIL) 5 MG tablet Take 1 tablet (5 mg total) by mouth 2 (two) times daily as needed.  30 tablet  0  . Garlic 500 MG CAPS Take 500 mg by mouth 2 (two) times daily.        Marland Kitchen  glucose blood (FREESTYLE LITE) test strip 1 each by Other route as needed. Use as instructed to check blood sugar four times daily (250.00)      . insulin lispro protamine-insulin lispro (HUMALOG 75/25) (75-25) 100 UNIT/ML SUSP Inject into the skin 2 (two) times daily with a meal. Sliding scale as directed       . Insulin Pen Needle (PEN NEEDLES 29GX1/2") 29G X MISC by Does not apply route. Use three times a day for insulin injection       . lisinopril (PRINIVIL,ZESTRIL) 40 MG tablet Take 40 mg by mouth daily.        . Misc Natural Products (GLUCOS-CHONDROIT-MSM COMPLEX) TABS Take by mouth 2 (two) times daily.        . Multiple Vitamin (MULTIVITAMIN) tablet Take 1 tablet by mouth daily.        . Omega 3-6-9 Fatty Acids (OMEGA 3-6-9 COMPLEX PO) Take by mouth daily.        . polyethylene glycol (MIRALAX / GLYCOLAX) packet Take 17 g by mouth daily.        . insulin lispro protamine-insulin lispro (HUMALOG 50/50) (50-50) 100 UNIT/ML SUSP Inject into the skin. Inject subcutaneously three times a day sliding scale        BP 108/58  Pulse 79   Temp(Src) 98.3 F (36.8 C) (Oral)  Resp 20  Wt 206 lb (93.441 kg)  SpO2 100%    Objective:   Physical Exam    Constitutional: Appears well-developed and well-nourished. No distress.  HENT:  Cardiovascular: Normal rate, regular rhythm and normal heart sounds.  Exam reveals no gallop and no friction rub.   No murmur heard. Pulmonary/Chest: Effort normal and breath sounds normal.  No wheezes. No rales.  Abdominal: Soft. Bowel sounds are normal. No mass. There is no tenderness.  Skin: redness and tenderness of left lower ext (from top of ankle to mid left shin), slightly warm to touch Psychiatric: Normal mood and affect. Behavior is normal.    Assessment & Plan:

## 2011-03-24 ENCOUNTER — Telehealth: Payer: Self-pay | Admitting: Internal Medicine

## 2011-03-24 ENCOUNTER — Ambulatory Visit: Payer: Medicare Other | Attending: Internal Medicine | Admitting: Physical Therapy

## 2011-03-24 DIAGNOSIS — M2560 Stiffness of unspecified joint, not elsewhere classified: Secondary | ICD-10-CM | POA: Insufficient documentation

## 2011-03-24 DIAGNOSIS — M542 Cervicalgia: Secondary | ICD-10-CM | POA: Insufficient documentation

## 2011-03-24 DIAGNOSIS — IMO0001 Reserved for inherently not codable concepts without codable children: Secondary | ICD-10-CM | POA: Insufficient documentation

## 2011-03-24 NOTE — Telephone Encounter (Signed)
Please call Dr. Gustavo Lah office to see if he samples of lovenox

## 2011-03-24 NOTE — Telephone Encounter (Signed)
THEY GAVE HER 14 NEEDLES FOR $25.00  DR YOO SAID THERE IS A DR IN THIS BUILDING WHO HAS NEEDLE SAMPLES. SHE HAS TO TAKE THESE SHOTS FOR A MONTH AND SHE WILL NEED  18 NEEDLES AS SHE RUINED ONE NEEDLE

## 2011-03-25 NOTE — Telephone Encounter (Signed)
Call returned to [facility] She stated patient did not pick up Rx at that Pharmacy.Call placed to [phone] Lynden Ang, she states the Rx was written for a quantity of 5.83ml which she stated was a 14 day supply. She states the needles come in 0.89ml each and the patient will need another Rx for additional 16 days if she is to remain on the medication for 30 days

## 2011-03-25 NOTE — Telephone Encounter (Signed)
Call placed to Dr Tama Gander office, spoke with Alvino Chapel, she stated they only have  samples for the 100 mg available.  Call placed to patient at [phone], she stated the pharmacy gave her 14 needles and she did not know how to get one of them off, so she has ruined one of the needles she has, and will need a replacement. Call placed to Karin Golden at [phone] to verify Rx patient has received they were closed for lunch until 2:30p

## 2011-03-26 ENCOUNTER — Telehealth: Payer: Self-pay | Admitting: Internal Medicine

## 2011-03-26 MED ORDER — ENOXAPARIN SODIUM 40 MG/0.4ML ~~LOC~~ SOLN: 40.0000 mg | SUBCUTANEOUS | Status: DC

## 2011-03-26 NOTE — Telephone Encounter (Signed)
Refill- hctz 12.5mg  cap. Take 1 capsule by mouth once daily. Qty 90. Last fill 2.8.12  Pharmacist remark: cycle fill medication. Authorization is required for next refill. No refills available.

## 2011-03-26 NOTE — Telephone Encounter (Signed)
Ok to refill x 3 

## 2011-03-26 NOTE — Telephone Encounter (Signed)
Call placed to patient at [phone], she was informed of the additional Rx to pharmacy

## 2011-03-27 ENCOUNTER — Encounter: Payer: Self-pay | Admitting: Internal Medicine

## 2011-03-27 MED ORDER — HYDROCHLOROTHIAZIDE 12.5 MG PO CAPS: 12.5000 mg | ORAL_CAPSULE | Freq: Every day | ORAL | Status: DC

## 2011-03-27 NOTE — Telephone Encounter (Signed)
Rx refill sent to pharmacy. 

## 2011-03-28 ENCOUNTER — Ambulatory Visit: Payer: Medicare Other | Admitting: Internal Medicine

## 2011-03-29 ENCOUNTER — Other Ambulatory Visit: Payer: Self-pay | Admitting: Internal Medicine

## 2011-03-31 ENCOUNTER — Ambulatory Visit: Payer: Medicare Other | Admitting: Internal Medicine

## 2011-03-31 ENCOUNTER — Ambulatory Visit: Payer: Medicare Other | Admitting: Physical Therapy

## 2011-03-31 NOTE — Telephone Encounter (Signed)
Rx refill sent to pharmacy on 03/27/2011

## 2011-04-01 ENCOUNTER — Ambulatory Visit: Payer: Medicare Other | Admitting: Internal Medicine

## 2011-04-01 NOTE — H&P (Signed)
[name], [name] NO.:  [id number]   MEDICAL RECORD NO.:  [id number]          PATIENT TYPE:  INP   LOCATION:  5151                         FACILITY:  [facility]   PHYSICIAN:  Gordy Savers, MDDATE OF BIRTH:  Mar 18, 1941   DATE OF ADMISSION:  05/18/2008  DATE OF DISCHARGE:                              HISTORY & PHYSICAL   CHIEF COMPLAINT:  Swelling of the right foot, ankle, and lower leg.   HISTORY OF PRESENT ILLNESS:  The patient is a 70 year old female who  presented to the office of her primary care physician 1 day prior to  admission.  At that time, she gave a 5-day history of right ankle  swelling and some pain.  She denied any injury but due to persistent  swelling, was referred for an outpatient venous Doppler evaluation.  Lower extremity venous Doppler examination revealed an acute occlusive  deep vein thrombosis in the right popliteal vein and also the posterior  tibial vein and peroneal vein.  The patient was then referred to the ED  for further evaluation and is now admitted for initiation of  anticoagulation therapy.   The patient denies any prior history or family history of thrombotic  disease.  She denied any pulmonary complaints.   PAST MEDICAL HISTORY:  The patient has a longstanding history of type 2  diabetes.  Additionally, she has hypertension, dyslipidemia, and a  history of osteopenia.  In January 2004, she underwent biopsy that  revealed a left breast ductal carcinoma in situ.  She has  osteoarthritis.   MEDICATION INTOLERANCES:  ACTOS and GLUCOPHAGE.   PRESENT MEDICAL REGIMEN:  1. Zocor 20 mg daily.  2. NovoLog sliding scale 12-20 units prior to each meal.  3. Lisinopril 20 mg daily.  4. Lantus 45 units at bedtime.  She takes additionally multivitamins, Os-Cal with vitamin D, fish oil  concentrate, baby aspirin 81 mg daily, and p.r.n. ibuprofen.   FAMILY HISTORY:  Noncontributory.  No prior history of thrombotic  disorders.   The father died at age 20 of complications of skin cancer,  possibly melanoma.  Mother died age 46 with a history of diabetes.  It  sounds like she died of complications of a bowel obstruction postop.   PHYSICAL EXAMINATION:  VITAL SIGNS:  Blood pressure 126/80, pulse rate  mid 70s and regular, afebrile.  GENERAL:  Revealed a mildly overweighed white female in no acute  distress.  HEAD AND NECK:  Revealed normal pupil responses.  Conjunctivae clear.  Fundi normal.  ENT:  Unremarkable.  NECK:  No bruits or adenopathy.  CHEST:  Clear.  CARDIOVASCULAR:  Normal S1 and S2.  No murmurs or gallops.  ABDOMEN:  Soft and nontender.  No organomegaly.  EXTREMITIES:  Revealed the left leg to be unremarkable.  Examination of  the right leg revealed soft tissue swelling involving primarily the  right ankle area.  This also involves some of the dorsum of the foot.  The right lower calf was also slightly tense and edematous.  There is  mild calf tenderness.   IMPRESSION:  Right leg deep vein thrombosis.  ADDITIONAL DIAGNOSES:  1. Diabetes mellitus type 2.  2. Hypertension.  3. Dyslipidemia.   DISPOSITION:  The patient will be admitted to the hospital.  She will be  started on Lovenox and Coumadin per pharmacy protocol.  The patient will  be considered for early discharge and outpatient bridge therapy with  Lovenox and Coumadin.      Gordy Savers, MD  Electronically Signed     PFK/MEDQ  D:  05/18/2008  T:  05/19/2008  Job:  [id number]

## 2011-04-01 NOTE — Consult Note (Signed)
NAMEBRITISH, MOYD             ACCOUNT NO.:  [id number]   MEDICAL RECORD NO.:  [id number]          PATIENT TYPE:  INP   LOCATION:  5118                         FACILITY:  [facility]   PHYSICIAN:  Blenda Nicely. Shadad        DATE OF BIRTH:  1941/08/21   DATE OF CONSULTATION:  12/21/2008  DATE OF DISCHARGE:  12/22/2008                                 CONSULTATION   REQUESTING PHYSICIAN:  Dr. Felicity Coyer   REASON FOR CONSULTATION:  DVT.   HISTORY OF PRESENT ILLNESS:  [name] is a pleasant 70 year old  woman with a past medical history of diabetes mellitus as well as  discoid lupus and a history of right lower extremity DVT in July 2009.  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  She had been on chronic Coumadin until  November 2009.  The patient was admitted with acute appendicitis on  December 20, 2008, undergoing emergent appendectomy.  She was noticed to  have left lower extremity edema, which she states has been present for  about 4 weeks, compounded with left calf tightening.  Left lower  extremity Dopplers were positive for subacute to chronic DVT in the  popliteal vein.  There was no evidence of acute DVT or superficial  thrombosis.  The right lower extremity Doppler is currently pending.  She was also short of breath but had negative CT angiography of the  chest.  D-dimer is positive at 384.  She was given heparin per pharmacy.  Her CT of the abdomen and pelvis are otherwise negative; no masses are  seen.  Hypercoagulable panel is currently pending.  We were asked to see  her with recommendations regarding this recurrence of her DVT.   PAST MEDICAL HISTORY:  1. History of right lower extremity DVT on May 20, 2008, requiring      Coumadin until November 2009.  2. History of osteoarthritis/osteopenia.  3. UTI this admission.  4. Remote history of  breast cancer as above.  5. 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  6. Hypertension.  7. Dyslipidemia.  8. Status post small cortical avulsion fracture and medial malleolus      fracture on May 18, 2008.   SURGERIES/PROCEDURE:  1. Status post laparoscopic appendectomy, emergent, Dr. Corliss Skains , on      December 20, 2008.  2. Status post left lumpectomy in January 2004, Dr. Maple Hudson.  3. Status post cholecystectomy in 2006 at [facility].  4. Obesity.   ALLERGIES:  ACTOS, GLUCOPHAGE, STATINS.   MEDICATIONS:  1. Unasyn 3 mg x1 IV.  2. Cipro 400 mg IV q.12 h.  3. Fentanyl IV p.r.n.  4. Lidocaine p.r.n.  5. Lopressor 5 mg p.o. daily.  6. Versed p.r.n.  7. Zofran p.r.n.  8. Protonix 40 mg daily.  9. Diprivan.  10.Anectine as directed.   REVIEW OF SYSTEMS:  Remarkable for dyspnea on exertion, shortness of  breath, description of tightness in her left calf as well as what she  describes as lumpiness in that area consistent with the DVT.  She denies  any swelling or warmth in the area.  The rest of the review of systems  is negative.  She denies any complications with childbirth.   FAMILY HISTORY:  Mother died at 46 with small-bowel obstruction.  Father  died at 73 with melanoma.  There is no clotting history in the family.   No recent trips.  She is very active.   SOCIAL HISTORY:  The patient is widowed.  She has 3 children.  She never  smoked.  She denies any significant amount of alcohol.  Lives in [place].  She is a Scientist, product/process development.  She is originally from [place],  later going to [place], and later moving to [place] several  years ago.   PHYSICAL EXAMINATION:  GENERAL:  This is a well-developed, obese 70-year-  old white female in no acute distress, alert and oriented x3.  VITAL SIGNS:  Blood pressure 122/56, pulse 56, respirations 20,  temperature 98.1, pulse oximetry 99% on room air.  Weight 90.7 kg,  height 68 inches.  HEENT:  Normocephalic,  atraumatic.  PERRLA.  Oral cavity without lesions  or thrush.  NECK:  Supple.  No cervical or supraclavicular masses.  LUNGS:  Clear to auscultation bilaterally.  No axillary masses.  CARDIOVASCULAR:  Regular rate and rhythm without murmurs, rubs or  gallops.  ABDOMEN:  Moderately obese, nontender except in the incisional area  where she had her emergency operation.  Bowel sounds x4.  No  hepatosplenomegaly.  EXTREMITIES:  With no clubbing or cyanosis, no edema.  There are left  possible cords and tender left calf.  No inguinal masses.  SKIN:  Without any other areas of bruising or petechial rash.  BREASTS:  Not examined.  GU/RECTAL:  Deferred.  MUSCULOSKELETAL:  No spinal tenderness.  NEURO:  Nonfocal.   LABORATORIES:  Hemoglobin 13.1, hematocrit 39.5, white count 10.5,  platelets 167, MCV 84, D-dimer 3.84, PTT 32, PT 13.1, INR 1.  Sodium  139, potassium 4.3, BUN 19, creatinine 1.4, glucose 272, total bilirubin  0.7, alkaline phosphatase 85, AST 25, ALT 22, total protein 7.6, albumin  4.1, calcium 9.5.  TSH 3.44.  Troponin negative.  A1c 8.3.  Hypercoagulable panel pending.   ASSESSMENT/PLAN:  Dr. Clelia Croft has seen and evaluated the patient and  reviewed the chart.  This is a 70 year old woman with multiple medical  problems and a recent history of deep venous thrombosis.  Of note, she  had a questionable ankle fracture per x-ray on May 18, 2008, and is  status post hospitalization in May 2009.  The patient had been treated  with Coumadin until November 2009.  She was admitted for acute  appendicitis, complaining of pain and swelling of the left lower  extremity.  Dopplers of the left lower extremity show chronic blood clot  with no acute findings.  The right lower extremity Dopplers are  negative.  A hypercoagulable panel is pending.  Thus, our impression is  the following:  1. History of acute deep venous thrombosis in July 2009, likely      provoked by ankle fracture versus  hospitalization.  2. 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  3. Status post appendicitis.  4. History of ductal carcinoma in situ of the breast, unlikely of any      significance.  5. History of discoid lupus which could be a contributor.  RECOMMENDATIONS:  Would proceed with prophylactic dose of heparin.  Agree with hypercoagulable panel.  Will be glad to follow her at the  [facility] upon discharge for a followup.   Thank you very much for allowing [place] the opportunity to participate in  the care of this nice patient.      Marlowe Kays, P.A.      Blenda Nicely. [facility]  Electronically Signed    SW/MEDQ  D:  12/23/2008  T:  12/23/2008  Job:  [id number]   cc:   [name], MD

## 2011-04-01 NOTE — Consult Note (Signed)
NAMEMONIQUE, HEFTY NO.:  [id number]   MEDICAL RECORD NO.:  [id number]          PATIENT TYPE:  OBV   LOCATION:  5118                         FACILITY:  [facility]   PHYSICIAN:  Michiel Cowboy, MDDATE OF BIRTH:  07/15/1941   DATE OF CONSULTATION:  12/20/2008  DATE OF DISCHARGE:                                 CONSULTATION   REASON FOR CONSULTATION:  Patient with acute appendicitis with multiple  medical problems and shortness of breath today with history of PE and  DVT in the past.   HISTORY OF PRESENT ILLNESS:  The patient is a 70 year old female with  past medical history significant for diabetes, hypertension,  hyperlipidemia, history of remote breast cancer, status post lumpectomy  and radiation therapy and history of discoid lupus.  The patient was at  baseline of health up until about 2 a.m. in the morning when she woke up  with right lower quadrant abdominal pain, no nausea, no vomiting, no  decreased appetite, no fevers, no chills.  The patient said the pain  progressed throughout the day and by the end of the day called her  primary care Naod Sweetland, who told her to come in to the emergency  department for evaluation for appendicitis.  Of note throughout the day  she tried vacuuming the floor and when she started to vacuum she  developed some shortness of breath.  She needed to sit down and rest.  The shortness of breath went away and she started vacuuming again.  The  shortness of breath came back again and then again resolved with rest.  She has tolerated this degree of activity in the past and usually able  to walk a block or even farther without any shortness of breath or chest  pain.  She denies having any chest pain recently or today in particular.  She did have an episode of chest pain in November for which she was  admitted to emergency department and ruled out and supposedly had  undergone a dobutamine stress test at her primary care Chayim Bialas's  office  per her primary care Arlind Klingerman's request, the results of which are not  available to [place], but per daughter, were unremarkable.   REVIEW OF SYSTEMS:  Otherwise review of systems unremarkable except for  as in HPI.   PAST MEDICAL HISTORY:  1. History of diabetes on Lantus.  2. Hyperlipidemia.  3. Hypertension.  4. Discoid lupus.  5. History of remote breast cancer, status post lumpectomy and      radiation therapy.  6. Cholecystitis, post cholecystectomy.  7. History of DVTs in the past for which she has been treated with six      months of Coumadin.  Also DVT is not completely determined.  Of      note she did have a trauma of the lower extremity sometime before,      showing a small cortical illusion of fracture that potentially      could have triggered a DVT.   SOCIAL HISTORY:  The patient never smoked, does not use drugs, does not  drink alcohol.  She is a  Jehovah's witness.  She does not wish to  receive any blood transfusion.   FAMILY HISTORY:  Noncontributory.   ALLERGIES:  1. She has intolerance to ACTOS.  2. GLUCOPHAGE.  3. STATINS.   MEDICATIONS:  1. NovoLog sliding scale.  2. Lantus 40 units at bedtime.  3. Lisinopril 40 mg once a day.  4. Multivitamins.  5. Glucosamine.  6. Calcium citrate 600 plus Vitamin D.  7. Omega 3 fatty acids.   PHYSICAL EXAMINATION:  VITAL SIGNS:  Temperature 97.5, blood pressure  155/67, pulse 93, respirations 16, saturating 100% on room air.  GENERAL:  The patient appears to be currently in no acute distress,  nontoxic.  HEENT:  Head nontraumatic.  Moist mucous membranes.  LUNGS:  Clear to auscultation bilaterally.  HEART:  Regular rate and rhythm.  No murmurs, rubs, or gallops.  ABDOMEN:  There is a point tenderness over the right lower quadrant,  otherwise unremarkable.  No peritoneal signs, no guarding, no rebound  tenderness.  EXTREMITIES:  On lower extremities there is a somewhat tender lump-like  swelling in the  left cuff of undetermined significance.  NEUROLOGIC:  Appears to be intact.   LABORATORY DATA:  White blood cell count 10.5, hemoglobin 13.1.  Sodium  139, potassium 4.3. creatinine 1.4.   LFTs are within normal limits.  Cardiac markers:  I-Stat negative x2  sets.   EKG shows normal sinus rhythm, negative small Q-wave in lead III and an  inversion of T-waves in lead III that is persistent in the three EKGs  obtained so far, otherwise no acute ischemic infarction noted.   UA significant for 7-10 white blood cells.   CT scan of the chest shows no PE but 5 mm right middle lobe nodule.  CT  scan of the abdomen showing appendicitis.   ASSESSMENT:  This is a 70 year old female with appendicitis and  shortness of breath with a reportedly negative stress test a few months  ago.  No known coronary artery disease, but positive history of  diabetes, hyperlipidemia, and hypertension.  Have repeated EKG now which  showed no progressive changes.  Would cycle cardiac enzymes x3.  If the  patient needs to undergo an emergent operation,  no further workup would  be indicated prior to emergent operation, but thereafter the patient  will likely benefit from continuing to cycle her cardiac enzymes and DVT  workup.  1. Shortness of breath.  CT scan negative for pulmonary edema.      Continue cycle cardiac enzymes.  EKG showed nonspecific changes.      Knowing she supposedly had a negative stress test, will obtain 2-D      echo and BNP.  Will obtain outpatient results of stress test if      available and may need to perform a stress test if it actually was      not done.  2. Diabetes.  Lantus 35 units subcu and sliding scale.  3. Hypertension.  Continue lisinopril.  Give perioperative beta      blocker.  4. Left leg swelling.  Positive D-dimer.  Will obtain Dopplers.  Will      defer to primary care team, at which point they feel      anticoagulation would be safe to start if necessary and would  also      defer to primary care team for DVT prophylaxis. Although she is at      risk for DVT she is about to undergo an invasive  procedure. She had      a history of DVT in the past, but is a Jehovah's witness and will      not accept blood transfusions if a bleeding      complication was to develop.  5. Urinary tract infection.  Will treat with Cipro 400 IV b.i.d.  6. Prophylaxis.  Protonix 40 mg p.o. daily and DVT prophylaxis as per      primary team.      Michiel Cowboy, MD  Electronically Signed     AVD/MEDQ  D:  12/20/2008  T:  12/20/2008  Job:  [id number]   cc:   [name], MD  Raenette Rover. Felicity Coyer, MD

## 2011-04-01 NOTE — Op Note (Signed)
NAMESHIRELLE, TOOTLE             ACCOUNT NO.:  [id number]   MEDICAL RECORD NO.:  [id number]          PATIENT TYPE:  OBV   LOCATION:  5118                         FACILITY:  [facility]   PHYSICIAN:  Wilmon Arms. Corliss Skains, M.D. DATE OF BIRTH:  01-Jul-1941   DATE OF PROCEDURE:  12/20/2008  DATE OF DISCHARGE:                               OPERATIVE REPORT   PREOPERATIVE DIAGNOSIS:  Acute appendicitis.   POSTOPERATIVE DIAGNOSIS:  Acute appendicitis.   PROCEDURE PERFORMED:  Laparoscopic appendectomy.   SURGEON:  Wilmon Arms. Corliss Skains, MD, FACS   ANESTHESIA:  General.   INDICATIONS:  The patient is a 70 year old female who presents with 18  hours of acute right lower quadrant pain.  She was evaluated at the  [facility] and was found to have appendicitis  on CT scan.  White count was 10.5.  She was transferred over to [facility] and was evaluated in the emergency department by myself as well as  the [facility].  She presents now for emergent appendectomy.   DESCRIPTION OF PROCEDURE:  The patient was brought to the operating room  and placed in supine position on the operating room table.  After an  adequate level of general anesthesia was obtained, a Foley catheter was  placed under sterile technique.  The patient's abdomen was prepped with  Betadine and draped in sterile fashion.  A time-out was taken to assure  the proper patient and proper procedure.  A small vertical incision was  made above the umbilicus.  Dissection was carried down to the fascia,  which was grasped with Kocher clamps and opened vertically.  We entered  the peritoneal cavity bluntly.  A stay suture of 0 Vicryl was placed  around the fascial opening.  Pneumoperitoneum was obtained by  insufflating with CO2 maintaining at maximal pressure of 15 mmHg.  The  laparoscope was inserted.  The patient was positioned in Trendelenburg  tilted to her left.  No gross purulence was noted in the abdomen.  A  5-  mm port was placed in the left lower quadrant and another one was placed  in the right upper quadrant.  The scope was changed to a 5-mm 30-degree  laparoscope then moved to the right upper quadrant.  The cecum was  mobilized medially and we identified the appendix.  It was thickened and  mildly inflamed, but certainly was not perforated.  The mesoappendix was  divided with the harmonic scalpel.  The base of the appendix was then  amputated with a Endo GIA stapler.  There was a little bit of bleeding  at the edge of the staple line.  This was controlled with the harmonic  scalpel.  The appendix was placed in EndoCatch sac and removed from the  umbilical port site.  We once again inspected the right lower quadrant  and hemostasis was good.  We suctioned as much irrigation as possible.  Pneumoperitoneum was then released so as to remove the trocars.  The  fascia was closed at the umbilicus with the pursestring suture.  A 4-0  Monocryl was used to  close the skin incisions.  Steri-Strips and clean  dressings were applied.  The patient was then extubated and brought to  recovery room in stable condition.  All sponge, instrument, and needle  counts were correct.      Wilmon Arms. Tsuei, M.D.  Electronically Signed     MKT/MEDQ  D:  12/20/2008  T:  12/21/2008  Job:  [id number]

## 2011-04-01 NOTE — Discharge Summary (Signed)
[name], [name] NO.:  [id number]   MEDICAL RECORD NO.:  [id number]          PATIENT TYPE:  INP   LOCATION:  5155                         FACILITY:  [facility]   PHYSICIAN:  [name], MD           DATE OF BIRTH:  04/19/1941   DATE OF ADMISSION:  05/18/2008  DATE OF DISCHARGE:  05/20/2008                               DISCHARGE SUMMARY   BRIEF HISTORY AND PHYSICAL:  [name] is an 70 year old lady with  history of diabetes, remote breast cancer, osteoarthritis, and skin  lupus who presented to the office on May 17, 2008, complaining of right  ankle swelling.  There was no chest pain, shortness of breath, or fever.  No previous injury.  The patient was sent for an x-ray of that area that  showed a small cortical avulsion fracture of the medial malleolus of  indeterminate age.  The next day, an ultrasound showed a right leg DVT.  The ultrasound technician marked the level of the DVT, and it was just  above the knee.  She was then called to the emergency room and admitted  for further evaluation.   PHYSICAL EXAMINATION:  GENERAL:  She was alert and oriented, in no  apparent distress.  VITAL SIGNS:  Blood pressure 126/88 and pulse 70.  She was afebrile.  LUNGS:  Clear to auscultation bilaterally.  CARDIOVASCULAR:  Regular rate and rhythm without murmur.  EXTREMITIES:  Lower extremities, the left leg was unremarkable.  The  right leg showed soft tissue swelling at the right ankle.  The right  lower calf was also slightly tense and edematous.   HOSPITAL COURSE:  The patient was admitted to the floor and started on  Lovenox and Coumadin.  Her hospital stay was unremarkable.  Her CBC  showed white count of 11.7 with a platelet count of 195, and final blood  count of 7.0.  Creatinine was 0.69.  LFTs were normal.  Calcium was 9.0,  hemoglobin A1c was 8.1.  At this point, she got maximal hospital  benefit, and she will continue with outpatient treatment with Lovenox  and Coumadin.  We faxed her prescription ahead of time to be sure that  Lovenox will be available to the patient.  The discharge instructions  are as follows.  1. Coumadin 7.5 mg 1 p.o. daily.  2. Lovenox 100 mg in 1 mL, the patient is to inject 0.9 mL b.i.d.  3. The patient is instructed to go to the [place] [facility] office on May 22, 2008, for INR.  4. Zocor 20 mg one a day.  5. Lisinopril 20 mg one a day.  6. Lantus 45 units at bedtime.  7. NovoLog on a sliding scale.  8. Take Tylenol for pain.  9. Rest with the leg elevated.  10.The patient is instructed not to take aspirin, ibuprofen, or Motrin      while she is on Coumadin.   ADMITTING DIAGNOSIS:  Right leg deep venous thrombosis.   DISCHARGE DIAGNOSES:  1. Right leg deep venous thrombosis.  2.  Diabetes, followup by her endocrinologist, Dr. Kearney Hard.  We noted      that her hemoglobin A1c today is 8.1;      however, this is improvement from previous hemoglobin A1c of 9.4 on      January 02, 2008.  3. Hypertension.  4. High cholesterol.      [name], MD  Electronically Signed     JP/MEDQ  D:  05/20/2008  T:  05/20/2008  Job:  [id number]

## 2011-04-01 NOTE — Discharge Summary (Signed)
[name], [name]             ACCOUNT NO.:  [id number]   MEDICAL RECORD NO.:  [id number]          PATIENT TYPE:  OBV   LOCATION:  3739                         FACILITY:  [facility]   PHYSICIAN:  Rosalyn Gess. Norins, MD  DATE OF BIRTH:  Jul 09, 1941   DATE OF ADMISSION:  08/31/2008  DATE OF DISCHARGE:  09/01/2008                               DISCHARGE SUMMARY   ADMITTING DIAGNOSIS:  Atypical chest discomfort, rule out myocardial  infarction, rule out pulmonary embolism.   DISCHARGE DIAGNOSES:  Myocardial infarction ruled out, pulmonary  embolism ruled out.   PROCEDURES:  CT angio performed on August 31, 2008, which was negative  for any pulmonary emboli.  No evidence of thoracic or aortic aneurysm or  dissection.  No evidence of hilar mediastinal masses or adenopathy.  No  clear evidence of pleural or pericardial effusion, both lungs being  clear.   HISTORY OF PRESENT ILLNESS:  The patient is followed by Dr. [name] in  [facility].  Her chief complaint at  presentation to the office was a scary feeling in the center of her  chest, which she felt like a lump sitting in her chest.  She reports  increased irritation.  She reported that evening prior to her office  visit, she felt she could not breathe.  She had a tightness.  She had  chest discomfort for 10 days described as a lump sitting in the middle  of her chest.  Symptoms were initially on and off but became more  constant.  There was no discomfort associated with swallowing or  exertion or triggers.  Tums were not helpful.  Because of these  symptoms, she was admitted to rule out for MI and/or PE given that she  recently had been diagnosed with DVT.  Please see the EMR H&P for past medical history, family history, social  history, and physical exam at admission.   HOSPITAL COURSE:  Chest discomfort.  The patient did have a CT angio on  the day of admission, which was negative for PE or any  pulmonary  problems.  The patient had cardiac enzymes x3 with CKs of 113 to 112 to  81 with troponin I of 0.02 to 0.02 to less than 0.01.  Telemetry  remained unremarkable.  A 12-lead EKG was unremarkable.  Old records  were reviewed and of note, the patient did have CT scan of the abdomen  and pelvis performed on August 21, 2008, with no acute findings in the  abdomen.  No acute findings in the anatomic pelvis.  With the patient  having ruled out for PE and ruled out for MI with her vital signs being  stable at this point, she is stable for discharge to home.  She will  continue to follow up with Dr. [name] for further elucidation of her  discomfort both in her abdomen as well as her complaint of shoulder and  back pain.   DISCHARGE EXAMINATION:  VITAL SIGNS:  Temperature 97.1, blood pressure  124/66, heart rate 62, respirations 15, O2 sats 97%.  CBGs were 234-255  to  347-240.  GENERAL APPEARANCE:  This is a heavyset Caucasian woman sitting in a  chair in no acute distress.  CHEST:  The patient is moving air well.  No rales, wheezes, or rhonchi  are noted.  CARDIOVASCULAR:  2+ radial pulse.  Her precordium was quiet.  She had a  regular rate and rhythm.  ABDOMEN:  Obese.  She had positive bowel sounds.  No guarding or rebound  was noted in the sitting position.  No further examination conducted.   FINAL LABORATORY:  Final INR performed on September 01, 2008 was 3.2.  Cardiac enzymes as noted.  Urinalysis with microscopic exam was  unremarkable with 0-2 wbc's per high-powered field.  Final basic  metabolic panel from August 31, 2008; sodium 136, potassium 4.2,  chloride 101, CO2 of 27, BUN 17, creatinine 0.74, glucose 282.  CBC on  admission with a hemoglobin of 13.1 g, white count was 9700.   DISPOSITION:  The patient is discharged to home.  She will continue on  all of her home medications per med-rec sheet, which includes:  1. Coumadin 7.5 mg as directed.  2. NovoLog sliding  scale.  3. Lantus 40 units to 45 units at bedtime.  4. Lisinopril 40 mg daily.  5. Zocor 20 mg daily.  6. Fosamax 70 mg weekly.  7. Glucosamine chondroitin b.i.d.  8. Caltrate 600 with D daily.  9. Omega essentials daily.  10.I will add Nexium 40 mg p.o. q.a.m.   Disposition is to home.  Follow up with Dr. [name] in 5-7 days.  The  patient's condition at the time of discharge dictation is stable.      Rosalyn Gess Norins, MD  Electronically Signed     MEN/MEDQ  D:  09/01/2008  T:  09/01/2008  Job:  [id number]   cc:   [name], MD

## 2011-04-03 ENCOUNTER — Ambulatory Visit (INDEPENDENT_AMBULATORY_CARE_PROVIDER_SITE_OTHER): Payer: Medicare Other | Admitting: Internal Medicine

## 2011-04-03 ENCOUNTER — Encounter: Payer: Self-pay | Admitting: Internal Medicine

## 2011-04-03 VITALS — BP 140/80 | HR 86 | Temp 98.5°F | Resp 18 | Wt 207.0 lb

## 2011-04-03 DIAGNOSIS — I809 Phlebitis and thrombophlebitis of unspecified site: Secondary | ICD-10-CM

## 2011-04-03 NOTE — Progress Notes (Signed)
Subjective:    Patient ID: [name], female    DOB: Mar 30, 1941, 70 y.o.   MRN: [id number]  HPI  70 y/o white female with hx of thrombophlebitis for follow up.  Her left leg symptoms improved - less redness, no tenderness.  She has been using lovenox as directed.  She also finished rx for bactrim as directed.  No side effects noted  She denies chest pain or shortness of breath.   Review of Systems  Past Medical History  Diagnosis Date  . History of colonic polyps   . Hypertension   . Diabetes mellitus type II   . Osteoarthritis   . GERD (gastroesophageal reflux disease)   . Osteopenia   . Lupus     of skin see dermatologist frequently  . 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 OA (osteoarthritis of spine)     C-spine  . DVT (deep venous thrombosis)     right leg DVT 05-17-2008, to d/c coumadin 11/09  . Leg swelling     left leg 2-10, u/s showed a old clot, has a hypercoag panel, f/u by hematology  . History of cardiovascular stress test     10/09 had CP, stress test neg, likely GI (GERD)-09/2006-had CP: stress test (-)    History   Social History  . Marital Status: Widowed    Spouse Name: N/A    Number of Children: N/A  . Years of Education: N/A   Occupational History  . Not on file.   Social History Main Topics  . Smoking status: Never Smoker   . Smokeless tobacco: Not on file  . Alcohol Use: No  . Drug Use: No  . Sexually Active: Not on file   Other Topics Concern  . Not on file   Social History Narrative   Single-widow 3 children , 1 in [place] (son, daughter - [place]) [place] to [place] 2002 from [place], [employer] Use - no   tobacco-- never  Illicit Drug Use - no Pt is Jehovah's Witness-No blood productsDaughter - [name]     Past Surgical History  Procedure Date  . Cholecystectomy   . Breast biopsy     left breast  . Breast lumpectomy     left breast-ductal  carcinoma in situ  . Appendectomy     Family History  Problem Relation Age of Onset  . Diabetes Mother   . Diabetes    . Heart attack Neg Hx   . Cancer Neg Hx     colon or breast    Allergies  Allergen Reactions  . Fluconazole     REACTION: severe fatigue and muscle weakness  . Metformin     REACTION: gi sxs  . Pioglitazone     REACTION: wt gain    Current Outpatient Prescriptions on File Prior to Visit  Medication Sig Dispense Refill  . Calcium Carbonate-Vit D-Min (CALCIUM 600 + MINERALS) 600-200 MG-UNIT TABS Take by mouth.        . Cholecalciferol (VITAMIN D3) 1000000 UNIT/GM LIQD Take 2,000 Units by mouth. 2,000 international untis (one drop) by mouth once daily at bedtime       . cyclobenzaprine (FLEXERIL) 5 MG tablet Take 1 tablet (5 mg total) by mouth 2 (two) times daily as needed.  30 tablet  0  . Garlic 500 MG CAPS Take 500 mg by mouth 2 (two) times  daily.        . glucose blood (FREESTYLE LITE) test strip 1 each by Other route as needed. Use as instructed to check blood sugar four times daily (250.00)      . hydrochlorothiazide (MICROZIDE) 12.5 MG capsule Take 1 capsule (12.5 mg total) by mouth daily.  90 capsule  0  . insulin lispro protamine-insulin lispro (HUMALOG 75/25) (75-25) 100 UNIT/ML SUSP Inject into the skin 2 (two) times daily with a meal. Sliding scale as directed       . Insulin Pen Needle (PEN NEEDLES 29GX1/2") 29G X MISC by Does not apply route. Use three times a day for insulin injection       . lisinopril (PRINIVIL,ZESTRIL) 40 MG tablet Take 40 mg by mouth daily.        . Misc Natural Products (GLUCOS-CHONDROIT-MSM COMPLEX) TABS Take by mouth 2 (two) times daily.        . Multiple Vitamin (MULTIVITAMIN) tablet Take 1 tablet by mouth daily.        . Omega 3-6-9 Fatty Acids (OMEGA 3-6-9 COMPLEX PO) Take by mouth daily.        . ondansetron (ZOFRAN-ODT) 8 MG disintegrating tablet Take 8 mg by mouth every 6 (six) hours as needed. nausea       .  polyethylene glycol (MIRALAX / GLYCOLAX) packet Take 17 g by mouth daily.        Marland Kitchen enoxaparin (LOVENOX) 40 MG/0.4ML SOLN Inject 0.4 mLs (40 mg total) into the skin daily.  6.4 mL  0  . insulin lispro protamine-insulin lispro (HUMALOG 50/50) (50-50) 100 UNIT/ML SUSP Inject into the skin. Inject subcutaneously three times a day sliding scale        BP 140/80  Pulse 86  Temp(Src) 98.5 F (36.9 C) (Oral)  Resp 18  Wt 207 lb (93.895 kg)  SpO2 99%       Objective:   Physical Exam  Constitutional: Appears well-developed and well-nourished. No distress.  Cardiovascular: Normal rate, regular rhythm and normal heart sounds.  Exam reveals no gallop and no friction rub.   No murmur heard. Pulmonary/Chest: Effort normal and breath sounds normal.  No wheezes. No rales.  Skin: Skin of left shin with mild redness.  No tenderness Psychiatric: Normal mood and affect. Behavior is normal.   Assessment & Plan:

## 2011-04-03 NOTE — Assessment & Plan Note (Addendum)
Symptoms improved but not resolved.   I recommend total of 4 weeks on lovenox Pt has not used her compression hose.  I urged pt to use as directed.

## 2011-04-04 NOTE — Op Note (Signed)
   [name], [name]                         ACCOUNT NO.:  [id number]   MEDICAL RECORD NO.:  [id number]                   PATIENT TYPE:  AMB   LOCATION:  DSC                                  FACILITY:  [facility]   PHYSICIAN:  Rose Phi. Maple Hudson, M.D.                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   POSTOPERATIVE DIAGNOSIS:  Abnormal left breast mammogram with atypical  ductal hyperplasia on needle biopsy.   PROCEDURE:  1. Left breast biopsy with needle localization.  2. Specimen mammography.   SURGEON:  Dr. Francina Ames.   ANESTHESIA:  MAC.   OPERATIVE PROCEDURE:  The patient was placed on the operating table with the  left arm extended on the arm board.  The left breast was prepped and draped  in the usual fashion.  The area of concern was at about the 4 o'clock  position on the left breast, and the needle localization wire came in from  laterally.  We then prepped the breast and draped it in the standard  fashion.  A curvilinear incision was then outlined with a marking pencil  centered on the previously placed localization wire.  The area was then  infiltrated with a mixture of 0.25% Marcaine and 1% Xylocaine.  An incision  was made and a wide excision of the wire and surrounding tissue was carried  out.  Specimen mammography confirmed the removal of the area in question.  Hemostasis was obtained by the cautery.  Subcuticular closure of 4-0  Monocryl and Steri-Strips was carried out.  Dressing was applied.   The patient was transferred to the recovery room in satisfactory condition  having tolerated the procedure well.                                               Rose Phi. Maple Hudson, M.D.    PRY/MEDQ  D:  11/23/2002  T:  11/23/2002  Job:  [id number]

## 2011-04-04 NOTE — Op Note (Signed)
[name], [name]             ACCOUNT NO.:  [id number]   MEDICAL RECORD NO.:  [id number]          PATIENT TYPE:  AMB   LOCATION:  DAY                          [facility]ACILITY[facility]  [facility]   PHYSICIAN:  Anselm Pancoast. Weatherly, M.D.DATE OF BIRTH:  May 21, 1941   DATE OF PROCEDURE:  12/23/2004  DATE OF DISCHARGE:                                 OPERATIVE REPORT   PREOPERATIVE DIAGNOSES:  1.  Chronic cholecystitis with stones.  2.  Diabetes mellitus.   POSTOPERATIVE DIAGNOSES:  1.  Chronic cholecystitis with stones.  2.  Diabetes mellitus.   PROCEDURE:  Laparoscopic cholecystectomy with cholangiogram.   SURGEON:  Anselm Pancoast. Zachery Dakins, M.D.   ASSISTANT:  Nurse.   HISTORY OF PRESENT ILLNESS:  [name] is a 70 year old diabetic who  was originally referred to me and I saw her in December.  She had been  evaluated for epigastric pain, kind of bloating, cramping, postprandial-type  symptoms.  She then was extensively evaluated, I think by Dr. Sheryn Bison.  She had had an ultrasound that showed stones.  When I saw her,  she was not sure that her symptoms were related to her gallbladder.  She was  scheduled for colonoscopy about two weeks later by Dr. Sheryn Bison and  had that.  Dr. Ruthine Dose is her regular physician, and she had elected not to  proceed on with surgery promptly.  On Friday, I was called by Dr. Ruthine Dose who  saw her in her office, and the patient had had an episode of right upper  quadrant pain, kind of radiating to the right posterior chest, and then a  similar episode had occurred on Friday.  Dr. Ruthine Dose described her as mildly  tender.  Her white count was not elevated, her sugar was not elevated.  I  was called and added her to the OR schedule for today.  The patient stayed  over the weekend.  She did not have further episodes of pain, and this  morning, her sugar was about 173, and she had not taken her insulin.   DESCRIPTION OF PROCEDURE:  The patient preoperatively  was given 3 g of  Unasyn and taken to the operating suite.  She had PAS stockings.  She  underwent induction of general anesthesia with an endotracheal tube.  The  abdomen was prepped with Betadine surgical scrub and solution and draped in  a sterile manner.   She had a little asymmetry of her umbilicus as if there was a little fascial  defect, even though I could not appreciate one on physical exam.  I made a  little incision below the umbilicus and kind of dissected over towards the  right side, since this was where the little bulge was.  I clearly identified  the fascia and picked this up between hemostats and then a small opening  made.  The posterior rectus fascia was then picked up between two hemostats.  A little traction suture superior and inferior was placed with 0 Vicryl, and  then with my finger I could actually feel the free peritoneal cavity, but  when I inserted the Hasson  cannula, we were not truly in the peritoneal  cavity.  I removed the Hasson and kind of dissected again with my finger.  I  put an [employer] in it and then slipped the Hasson cannula in.  The  intraperitoneal cavity was free of adhesions.  The gallbladder was distended  but not acutely inflamed.  The upper 10-mm trocar was placed under direct  vision, and the two lateral 5-mm trocars were placed at the appropriate  sites.  The gallbladder was moderately tense.  It had some adhesions along  the lateral aspect, and these were carefully taken down with hot scissors  and hook electrocautery and hemostasis obtained.  The gallbladder proximally  was grasped with the __________ dissector.  The two lateral 5-mm ports, of  course, had been placed in appropriate position under direct vision.  The  proximal portion of the gallbladder was then dissected free.  The anterior  branch of the cystic artery was doubly clipped proximally, singly distally,  and divided.  Then cystic duct was then encompassed, and this was a  little  short cystic duct.  It was clipped flush with the gallbladder.  You could  see the numerous stones in the gallbladder, and then a small opening was  made.  A Cook catheter was placed in the proximal cystic duct and an x-ray  obtained.  There was good prompt fill in the extrahepatic biliary system and  good flow into the duodenum.  The catheter was removed.  Three clips were  placed on the cystic duct and then the cystic duct divided.  The posterior  branch of the cystic artery was identified, and this was doubly clipped  proximally, singly distally, and divided.  The gallbladder was then freed  from its bed with the hook electrocautery.  Good hemostasis was obtained.  I  placed the gallbladder in an EndoCatch bag.  The camera was then placed in  the upper 10-mm trocar, and we grasped the bag and brought it up through the  fascial defect and then kind of wiggled the bag, getting the gallbladder to  come through intact.  Some of the stones were pretty prominent in size, and  there were numerous stones in the gallbladder.  The two sutures that I had  placed did not really encompass this little defect that was to the right of  the midline, and I grasped it with another Tresa Endo and then used a #5 needle  so I could encompass this with a figure-of-eight and then two single sutures  of 0 Vicryl.  The subcutaneous tissue was closed with 4-0 Vicryl.  I used a  little dissector to make sure that the little fascial defect had been  closed, and then the lateral 5-mm trocars were withdrawn under direct  vision.  The irrigation fluid had been aspirated, and there was no evidence  of bleeding.  The carbon dioxide was released, and the upper 10-mm trocar  was withdrawn under direct vision.   The patient tolerated the procedure nicely and will hopefully be ready for  discharge in the morning.  I will check her glucose in the recovery room and may give her at least half of her a.m. dose of 39 units  which she did not  take of 70/30.      WJW/MEDQ  D:  12/23/2004  T:  12/23/2004  Job:  [id number]   cc:   Angelena Sole, M.D. [facility]

## 2011-04-07 ENCOUNTER — Ambulatory Visit: Payer: Medicare Other | Admitting: Physical Therapy

## 2011-04-08 ENCOUNTER — Ambulatory Visit: Payer: Medicare Other | Admitting: Internal Medicine

## 2011-04-21 ENCOUNTER — Encounter: Payer: Medicare Other | Admitting: Physical Therapy

## 2011-04-25 ENCOUNTER — Ambulatory Visit (INDEPENDENT_AMBULATORY_CARE_PROVIDER_SITE_OTHER): Payer: Medicare Other | Admitting: Family Medicine

## 2011-04-25 ENCOUNTER — Ambulatory Visit: Payer: Medicare Other | Admitting: Family Medicine

## 2011-04-25 ENCOUNTER — Ambulatory Visit: Payer: Medicare Other | Admitting: Internal Medicine

## 2011-04-25 ENCOUNTER — Encounter: Payer: Self-pay | Admitting: Family Medicine

## 2011-04-25 VITALS — BP 124/74 | HR 86 | Temp 98.1°F | Resp 18 | Wt 207.0 lb

## 2011-04-25 DIAGNOSIS — I809 Phlebitis and thrombophlebitis of unspecified site: Secondary | ICD-10-CM

## 2011-04-25 DIAGNOSIS — R300 Dysuria: Secondary | ICD-10-CM

## 2011-04-25 LAB — POCT URINALYSIS DIPSTICK
Bilirubin, UA: NEGATIVE
Glucose, UA: NEGATIVE
Nitrite, UA: NEGATIVE

## 2011-04-25 MED ORDER — CIPROFLOXACIN HCL 250 MG PO TABS: 250.0000 mg | ORAL_TABLET | Freq: Two times a day (BID) | ORAL | Status: AC

## 2011-04-25 MED ORDER — FUROSEMIDE 20 MG PO TABS: 20.0000 mg | ORAL_TABLET | Freq: Every day | ORAL | Status: DC

## 2011-04-25 NOTE — Assessment & Plan Note (Signed)
UA today w/large blood and LEU.  Sent for c/s. Started cipro 250mg  bid x 5d.

## 2011-04-25 NOTE — Assessment & Plan Note (Signed)
Symptoms unchanged in the last month.  Finished 1 mo lovenox. Ted hose didn't fit correctly per pt, and she says Dr. Artist Pais instructed her to stop wearing them. I discussed elevation of legs prn, keep active, and will change from 12.5mg  HCTZ to lasix 20mg  qd to see if she can get any better response/edema relief.  Monitor pain and call if location ascending up leg.

## 2011-04-25 NOTE — Progress Notes (Signed)
OFFICE NOTE  04/25/2011  CC:  Chief Complaint  Patient presents with  . Hyperlipidemia  . Diabetes  . Leg Pain    continues to have left lower leg pain and swelling     HPI:   Patient is a 70 y.o. Caucasian female who is here for f/u thrombophlebitis. She just finished a 1 mo course of daily lovenox therapy for left lower leg superficial thrombosis.  She has a past history of DVT in same leg and took 50mo course of coumadin for this about 2 yrs ago.   Has ongoing pain from level of knee down to ankle, not noticing any diff in LE edema on either leg since being placed on HCTZ 12.5mg  qd. This is unchanged from her area of pain 12mo ago and is consistent with findings on u/s at that time. Also c/o 1 wk hx of burning at the end of urination and right after.  No flank or abd pain or fever.  No CP or SOB.  Pertinent PMH:  DVT DM 2, insulin-requiring. HTN Hyperlipidemia Recurrent UTI  MEDS;   Outpatient Prescriptions Prior to Visit  Medication Sig Dispense Refill  . Calcium Carbonate-Vit D-Min (CALCIUM 600 + MINERALS) 600-200 MG-UNIT TABS Take by mouth.        . Cholecalciferol (VITAMIN D3) 1000000 UNIT/GM LIQD Take 2,000 Units by mouth. 2,000 international untis (one drop) by mouth once daily at bedtime       . cyclobenzaprine (FLEXERIL) 5 MG tablet Take 1 tablet (5 mg total) by mouth 2 (two) times daily as needed.  30 tablet  0  . Garlic 500 MG CAPS Take 500 mg by mouth 2 (two) times daily.        Marland Kitchen glucose blood (FREESTYLE LITE) test strip 1 each by Other route as needed. Use as instructed to check blood sugar four times daily (250.00)      . insulin lispro protamine-insulin lispro (HUMALOG 75/25) (75-25) 100 UNIT/ML SUSP Inject into the skin 2 (two) times daily with a meal. Sliding scale as directed      . Insulin Pen Needle (PEN NEEDLES 29GX1/2") 29G X MISC by Does not apply route. Use three times a day for insulin injection       . lisinopril (PRINIVIL,ZESTRIL) 40 MG tablet  Take 40 mg by mouth daily.        . Misc Natural Products (GLUCOS-CHONDROIT-MSM COMPLEX) TABS Take by mouth 2 (two) times daily.        . Multiple Vitamin (MULTIVITAMIN) tablet Take 1 tablet by mouth daily.        . hydrochlorothiazide (MICROZIDE) 12.5 MG capsule Take 1 capsule (12.5 mg total) by mouth daily.  90 capsule  0  . Omega 3-6-9 Fatty Acids (OMEGA 3-6-9 COMPLEX PO) Take by mouth daily.        . ondansetron (ZOFRAN-ODT) 8 MG disintegrating tablet Take 8 mg by mouth every 6 (six) hours as needed. nausea       . polyethylene glycol (MIRALAX / GLYCOLAX) packet Take 17 g by mouth daily.          PE: Blood pressure 124/74, pulse 86, temperature 98.1 F (36.7 C), temperature source Oral, resp. rate 18, weight 207 lb (93.895 kg), SpO2 100.00%. Gen: Alert, well appearing.  Patient is oriented to person, place, time, and situation. LEGS: L>R lower leg pitting edema with diffuse freckling c/w stasis derm changes.  No erythema, no warmth, no palpable cord/mass.   Soft and mildly tender from  upper calf down to achilles area on left.  No popliteal area or thigh TTP.  IMPRESSION AND PLAN:  Dysuria UA today w/large blood and LEU.  Sent for c/s. Started cipro 250mg  bid x 5d.  Thrombophlebitis Symptoms unchanged in the last month.  Finished 1 mo lovenox. Ted hose didn't fit correctly per pt, and she says Dr. Artist Pais instructed her to stop wearing them. I discussed elevation of legs prn, keep active, and will change from 12.5mg  HCTZ to lasix 20mg  qd to see if she can get any better response/edema relief.  Monitor pain and call if location ascending up leg.     FOLLOW UP:  Return in about 3 weeks (around 05/16/2011).

## 2011-04-28 LAB — URINE CULTURE: Colony Count: >=100000

## 2011-04-29 ENCOUNTER — Ambulatory Visit (HOSPITAL_BASED_OUTPATIENT_CLINIC_OR_DEPARTMENT_OTHER)
Admission: RE | Admit: 2011-04-29 | Discharge: 2011-04-29 | Disposition: A | Discharge status: Home / Self Care | Payer: Medicare Other | Source: Ambulatory Visit | Attending: Family | Admitting: Family

## 2011-04-29 ENCOUNTER — Ambulatory Visit: Payer: Medicare Other | Admitting: Family Medicine

## 2011-04-29 ENCOUNTER — Ambulatory Visit (INDEPENDENT_AMBULATORY_CARE_PROVIDER_SITE_OTHER): Payer: Medicare Other | Admitting: Family

## 2011-04-29 ENCOUNTER — Encounter: Payer: Self-pay | Admitting: Family

## 2011-04-29 ENCOUNTER — Telehealth: Payer: Self-pay | Admitting: Family

## 2011-04-29 ENCOUNTER — Other Ambulatory Visit: Payer: Self-pay | Admitting: Family

## 2011-04-29 VITALS — BP 132/68 | HR 90 | Temp 98.0°F | Resp 18 | Ht 67.99 in | Wt 211.1 lb

## 2011-04-29 DIAGNOSIS — M79669 Pain in unspecified lower leg: Secondary | ICD-10-CM

## 2011-04-29 DIAGNOSIS — M7989 Other specified soft tissue disorders: Secondary | ICD-10-CM | POA: Insufficient documentation

## 2011-04-29 DIAGNOSIS — M79609 Pain in unspecified limb: Secondary | ICD-10-CM

## 2011-04-29 DIAGNOSIS — I82819 Embolism and thrombosis of superficial veins of unspecified lower extremities: Secondary | ICD-10-CM | POA: Insufficient documentation

## 2011-04-29 DIAGNOSIS — R609 Edema, unspecified: Secondary | ICD-10-CM

## 2011-04-29 NOTE — Telephone Encounter (Signed)
Received call from Dr. Allena Katz that pt was at chiropracter and had leg swelling.  Please contact patient and arrange visit this afternoon.

## 2011-04-29 NOTE — Progress Notes (Signed)
Subjective:    Patient ID: [name], female    DOB: Mar 15, 1941, 70 y.o.   MRN: [id number]  HPI  [name] is a 70 yr old female who presents today with complaint of worsening LLE pain/swelling.   Denies associated fever, or redness. She completed a 1 month course of lovenox for a superficial thrombophlebitis.  She was seen last week by Dr. Marvel Plan for the same complaint and it was recommended that she elevate her leg, and continue diuretic therapy. [name] does not notes significant improvement with these measures.  Review of Systems Past Medical History  Diagnosis Date  . History of colonic polyps   . Hypertension   . Diabetes mellitus type II   . Osteoarthritis   . GERD (gastroesophageal reflux disease)   . Osteopenia   . Lupus     of skin see dermatologist frequently  . 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 OA (osteoarthritis of spine)     C-spine  . DVT (deep venous thrombosis)     right leg DVT 05-17-2008, to d/c coumadin 11/09  . Leg swelling     left leg 2-10, u/s showed a old clot, has a hypercoag panel, f/u by hematology  . History of cardiovascular stress test     10/09 had CP, stress test neg, likely GI (GERD)-09/2006-had CP: stress test (-)    History   Social History  . Marital Status: Widowed    Spouse Name: N/A    Number of Children: 3  . Years of Education: N/A   Occupational History  . Retired    Social History Main Topics  . Smoking status: Never Smoker   . Smokeless tobacco: Not on file  . Alcohol Use: No  . Drug Use: No  . Sexually Active: Not Currently   Other Topics Concern  . Not on file   Social History Narrative   Single-widow 3 children , 1 in [place] (son, daughter - [place]) [place] to [place] 2002 from [place], [employer] Use - no   tobacco-- never  Illicit Drug Use - no Pt is Jehovah's Witness-No blood productsDaughter - [name]     Past Surgical  History  Procedure Date  . Cholecystectomy   . Breast biopsy     left breast  . Breast lumpectomy     left breast-ductal carcinoma in situ  . Appendectomy     Family History  Problem Relation Age of Onset  . Diabetes Mother   . Diabetes    . Heart attack Neg Hx   . Cancer Neg Hx     colon or breast    Allergies  Allergen Reactions  . Fluconazole     REACTION: severe fatigue and muscle weakness  . Metformin     REACTION: gi sxs  . Pioglitazone     REACTION: wt gain    Current Outpatient Prescriptions on File Prior to Visit  Medication Sig Dispense Refill  . Cholecalciferol (VITAMIN D3) 1000000 UNIT/GM LIQD Take 2,000 Units by mouth. 2,000 international untis (one drop) by mouth once daily at bedtime       . cyclobenzaprine (FLEXERIL) 5 MG tablet Take 1 tablet (5 mg total) by mouth 2 (two) times daily as needed.  30 tablet  0  . furosemide (LASIX) 20 MG tablet Take 1 tablet (20 mg total) by mouth daily.  30 tablet  5  .  Garlic 500 MG CAPS Take 500 mg by mouth 2 (two) times daily.        Marland Kitchen glucose blood (FREESTYLE LITE) test strip 1 each by Other route as needed. Use as instructed to check blood sugar four times daily (250.00)      . insulin lispro protamine-insulin lispro (HUMALOG 75/25) (75-25) 100 UNIT/ML SUSP Inject into the skin 2 (two) times daily with a meal. Sliding scale as directed      . Insulin Pen Needle (PEN NEEDLES 29GX1/2") 29G X MISC by Does not apply route. Use three times a day for insulin injection       . lisinopril (PRINIVIL,ZESTRIL) 40 MG tablet Take 40 mg by mouth daily.        . Misc Natural Products (GLUCOS-CHONDROIT-MSM COMPLEX) TABS Take by mouth 2 (two) times daily.        . Multiple Vitamin (MULTIVITAMIN) tablet Take 1 tablet by mouth daily.        . Omega 3-6-9 Fatty Acids (OMEGA 3-6-9 COMPLEX PO) Take by mouth daily.        . polyethylene glycol (MIRALAX / GLYCOLAX) packet Take 17 g by mouth daily.          BP 132/68  Pulse 90   Temp(Src) 98 F (36.7 C) (Oral)  Resp 18  Ht 5' 7.99" (1.727 m)  Wt 211 lb 1.3 oz (95.745 kg)  BMI 32.10 kg/m2       Objective:   Physical Exam  Constitutional: She appears well-developed and well-nourished.  Cardiovascular: Normal rate and regular rhythm.   Pulmonary/Chest: Effort normal and breath sounds normal.  Musculoskeletal:       1-2+ LLE edema, 1+ RLE edema.    no erythema  Is noted of the lower extremities.       Assessment & Plan:

## 2011-04-29 NOTE — Telephone Encounter (Signed)
Spoke to pt and scheduled appt for her to see Melissa at 1:45pm.

## 2011-04-29 NOTE — Patient Instructions (Signed)
Follow up in 1 month, call if increased pain/swelling of the feet or if you develop redness.

## 2011-05-02 ENCOUNTER — Ambulatory Visit: Payer: Medicare Other | Admitting: Internal Medicine

## 2011-05-06 ENCOUNTER — Ambulatory Visit: Payer: Medicare Other | Admitting: Family

## 2011-05-07 ENCOUNTER — Encounter: Payer: Self-pay | Admitting: Internal Medicine

## 2011-05-08 ENCOUNTER — Telehealth: Payer: Self-pay | Admitting: *Deleted

## 2011-05-08 NOTE — Assessment & Plan Note (Addendum)
No clinical sign of cellulitis.  LE doppler was performed which was neg for dvt. It again noted occlusion of the distal greater saphenous vein from the lower calf to the ankle. Also noted that the extent of occlusion appears decreased. Reassurance provided to patient. Continue elevation and furosemide, call us if increasing pain/redness of fever.

## 2011-05-08 NOTE — Telephone Encounter (Signed)
Pt.notified

## 2011-05-08 NOTE — Telephone Encounter (Signed)
If persistent black stools she should be seen in the ED. She can try OTC imodium for diarrhea.

## 2011-05-08 NOTE — Telephone Encounter (Signed)
Pt states she has had abdominal pain and diarrhea x 4 days. Has had 8 episodes so far today. Reports that her bowel movements are black. States that she tried [employer] yesterday without any relief of symptoms and reports black stools prior to taking Pepto Bismol. Pt has scheduled appt with Sandford Craze, NP for tomorrow at 10:45am but would like to know if there is anything else OTC that she could try for the diarrhea. Please advise.

## 2011-05-09 ENCOUNTER — Encounter: Payer: Self-pay | Admitting: Internal Medicine

## 2011-05-09 ENCOUNTER — Ambulatory Visit (INDEPENDENT_AMBULATORY_CARE_PROVIDER_SITE_OTHER): Payer: Medicare Other | Admitting: Family

## 2011-05-09 ENCOUNTER — Encounter: Payer: Self-pay | Admitting: Family

## 2011-05-09 ENCOUNTER — Telehealth: Payer: Self-pay | Admitting: Internal Medicine

## 2011-05-09 DIAGNOSIS — K5289 Other specified noninfective gastroenteritis and colitis: Secondary | ICD-10-CM

## 2011-05-09 DIAGNOSIS — K529 Noninfective gastroenteritis and colitis, unspecified: Secondary | ICD-10-CM | POA: Insufficient documentation

## 2011-05-09 NOTE — Telephone Encounter (Signed)
Pt states that diarrhea is still not any better and is now having chills. Melissa told pt to call if not feeling better.

## 2011-05-09 NOTE — Progress Notes (Signed)
Subjective:    Patient ID: [name], female    DOB: Dec 19, 1940, 70 y.o.   MRN: [id number]  HPI  [name] is a 70 yr old female who presents today with chief complaint of diarrhea.  She reports diarrhea started Wednesday night.  She reports that she took Pepto bismol and after that she had several black stools.  Yesterday she tried some imodium and this has helped her diarrhea.  Today she has had 3 formed stools- greenish in color.  Denies fever of BRBPR.  Notes some lower abdominal cramping.  She reports that she has not taken her diuretic in several days.   Review of Systems See history of present illness  Past Medical History  Diagnosis Date  . History of colonic polyps   . Hypertension   . Diabetes mellitus type II   . Osteoarthritis   . GERD (gastroesophageal reflux disease)   . Osteopenia   . Lupus     of skin see dermatologist frequently  . 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 OA (osteoarthritis of spine)     C-spine  . DVT (deep venous thrombosis)     right leg DVT 05-17-2008, to d/c coumadin 11/09  . Leg swelling     left leg 2-10, u/s showed a old clot, has a hypercoag panel, f/u by hematology  . History of cardiovascular stress test     10/09 had CP, stress test neg, likely GI (GERD)-09/2006-had CP: stress test (-)    History   Social History  . Marital Status: Widowed    Spouse Name: N/A    Number of Children: 3  . Years of Education: N/A   Occupational History  . Retired    Social History Main Topics  . Smoking status: Never Smoker   . Smokeless tobacco: Not on file  . Alcohol Use: No  . Drug Use: No  . Sexually Active: Not Currently   Other Topics Concern  . Not on file   Social History Narrative   Single-widow 3 children , 1 in [place] (son, daughter - [name]) [place] to [place] from [place], [employer] Use - no   tobacco-- never  Illicit Drug Use - no  Pt is Jehovah's Witness-No blood productsDaughter - [name]     Past Surgical History  Procedure Date  . Cholecystectomy   . Breast biopsy     left breast  . Breast lumpectomy     left breast-ductal carcinoma in situ  . Appendectomy     Family History  Problem Relation Age of Onset  . Diabetes Mother   . Diabetes    . Heart attack Neg Hx   . Cancer Neg Hx     colon or breast    Allergies  Allergen Reactions  . Fluconazole     REACTION: severe fatigue and muscle weakness  . Metformin     REACTION: gi sxs  . Pioglitazone     REACTION: wt gain    Current Outpatient Prescriptions on File Prior to Visit  Medication Sig Dispense Refill  . Cholecalciferol (VITAMIN D3) 1000000 UNIT/GM LIQD Take 2,000 Units by mouth. 2,000 international untis (one drop) by mouth once daily at bedtime       . cyclobenzaprine (FLEXERIL) 5 MG tablet Take 1 tablet (5 mg total) by mouth 2 (two) times daily as needed.  30 tablet  0  . furosemide (LASIX) 20 MG tablet Take 1 tablet (20 mg total) by mouth daily.  30 tablet  5  . Garlic 500 MG CAPS Take 500 mg by mouth 2 (two) times daily.        Marland Kitchen glucose blood (FREESTYLE LITE) test strip 1 each by Other route as needed. Use as instructed to check blood sugar four times daily (250.00)      . insulin lispro protamine-insulin lispro (HUMALOG 75/25) (75-25) 100 UNIT/ML SUSP Inject into the skin 2 (two) times daily with a meal. Sliding scale as directed      . Insulin Pen Needle (PEN NEEDLES 29GX1/2") 29G X MISC by Does not apply route. Use three times a day for insulin injection       . lisinopril (PRINIVIL,ZESTRIL) 40 MG tablet Take 40 mg by mouth daily.        . Misc Natural Products (GLUCOS-CHONDROIT-MSM COMPLEX) TABS Take by mouth 2 (two) times daily.        . Multiple Vitamin (MULTIVITAMIN) tablet Take 1 tablet by mouth daily.        . Omega 3-6-9 Fatty Acids (OMEGA 3-6-9 COMPLEX PO) Take by mouth daily.        . polyethylene glycol (MIRALAX /  GLYCOLAX) packet Take 17 g by mouth daily.          BP 136/60  Pulse 96  Temp(Src) 97.6 F (36.4 C) (Oral)  Resp 16  Ht 5\' 7"  (1.702 m)  Wt 207 lb (93.895 kg)  BMI 32.42 kg/m2       Objective:   Physical Exam  Constitutional: She appears well-developed and well-nourished.  Cardiovascular: Normal rate and regular rhythm.   Pulmonary/Chest: Effort normal and breath sounds normal.  Abdominal: Soft. Bowel sounds are normal.       Mild lower abdominal tenderness without guarding.  Rectal exam normal, heme-negative stool today          Assessment & Plan:

## 2011-05-09 NOTE — Assessment & Plan Note (Signed)
Clinically I suspect that she has a resolving acute gastroenteritis. She is now having formed stools. I recommended that she continue with rehydration today, and that if no further diarrhea she can restart her diuretics tomorrow. She is to contact us should she develop recurrent diarrhea, or worsening abdominal pain.

## 2011-05-09 NOTE — Telephone Encounter (Signed)
Please advise 

## 2011-05-09 NOTE — Patient Instructions (Signed)
Please call if you develop worsening abdominal pain, recurrent diarrhea, or if symptoms do not continue to improve.

## 2011-05-09 NOTE — Telephone Encounter (Signed)
Spoke with patient, abdominal pain is unchanged.  5 loose stools, + chills- but does not think that she has fever.  I recommended that she take her temperature- go to the ER if worsening abdominal pain, fever over 101, of if diarrhea does not improve.  Ok to use imodium PRN.  I also reminded pt that the [facility] clinic is open tomorrow AM and is an option. She verbalizes understanding.

## 2011-05-12 ENCOUNTER — Telehealth: Payer: Self-pay | Admitting: Family

## 2011-05-12 ENCOUNTER — Telehealth: Payer: Self-pay | Admitting: Internal Medicine

## 2011-05-12 NOTE — Telephone Encounter (Signed)
Received message from pt stating her diarrhea stopped for 1 day and restarted this pm after eating. Abdominal pain has also restarted. Pt states she did not go to the ER this weekend because she cannot afford the cost. Please advise.

## 2011-05-12 NOTE — Telephone Encounter (Signed)
Refill- lisinopril 40mg  tab. Take 1 tablet by mouth once a day. Qty 90. Last fill 3.29.12  Pharmacist remark: cycle fill medication. Authorization is required for next refill. No refills available.

## 2011-05-12 NOTE — Telephone Encounter (Signed)
Reports 6-8 loose stools since lunch today.  Greenish in color. She declines to go to the ED.  I recommended that she keep her f/u apt tomorrow AM with me as scheduled.

## 2011-05-13 ENCOUNTER — Ambulatory Visit (HOSPITAL_BASED_OUTPATIENT_CLINIC_OR_DEPARTMENT_OTHER)
Admission: RE | Admit: 2011-05-13 | Discharge: 2011-05-13 | Disposition: A | Discharge status: Home / Self Care | Payer: Medicare Other | Source: Ambulatory Visit | Attending: Family | Admitting: Family

## 2011-05-13 ENCOUNTER — Other Ambulatory Visit (HOSPITAL_BASED_OUTPATIENT_CLINIC_OR_DEPARTMENT_OTHER): Payer: Medicare Other

## 2011-05-13 ENCOUNTER — Telehealth: Payer: Self-pay | Admitting: Family

## 2011-05-13 ENCOUNTER — Ambulatory Visit (INDEPENDENT_AMBULATORY_CARE_PROVIDER_SITE_OTHER): Payer: Medicare Other | Admitting: Family

## 2011-05-13 ENCOUNTER — Encounter: Payer: Self-pay | Admitting: Family

## 2011-05-13 DIAGNOSIS — K449 Diaphragmatic hernia without obstruction or gangrene: Secondary | ICD-10-CM

## 2011-05-13 DIAGNOSIS — K529 Noninfective gastroenteritis and colitis, unspecified: Secondary | ICD-10-CM | POA: Insufficient documentation

## 2011-05-13 DIAGNOSIS — R109 Unspecified abdominal pain: Secondary | ICD-10-CM

## 2011-05-13 DIAGNOSIS — K5289 Other specified noninfective gastroenteritis and colitis: Secondary | ICD-10-CM

## 2011-05-13 DIAGNOSIS — R197 Diarrhea, unspecified: Secondary | ICD-10-CM

## 2011-05-13 DIAGNOSIS — I2510 Atherosclerotic heart disease of native coronary artery without angina pectoris: Secondary | ICD-10-CM

## 2011-05-13 DIAGNOSIS — R1032 Left lower quadrant pain: Secondary | ICD-10-CM

## 2011-05-13 LAB — CBC WITH DIFFERENTIAL/PLATELET
Eosinophils Absolute: 0.2 10*3/uL (ref 0.0–0.7)
Lymphs Abs: 1.7 10*3/uL (ref 0.7–4.0)
MCH: 27.6 pg (ref 26.0–34.0)
Neutrophils Relative %: 67 % (ref 43–77)
Platelets: 234 10*3/uL (ref 150–400)
RBC: 4.34 MIL/uL (ref 3.87–5.11)
WBC: 8.1 10*3/uL (ref 4.0–10.5)

## 2011-05-13 LAB — BASIC METABOLIC PANEL
BUN: 8 mg/dL (ref 6–23)
Calcium: 8.9 mg/dL (ref 8.4–10.5)
Creat: 0.76 mg/dL (ref 0.50–1.10)

## 2011-05-13 MED ORDER — METRONIDAZOLE 500 MG PO TABS: 500.0000 mg | ORAL_TABLET | Freq: Three times a day (TID) | ORAL | Status: DC

## 2011-05-13 MED ORDER — IOHEXOL 300 MG/ML  SOLN
100.0000 mL | Freq: Once | INTRAMUSCULAR | Status: AC | PRN
  Administered 2011-05-13: 100 mL via INTRAVENOUS

## 2011-05-13 MED ORDER — LISINOPRIL 40 MG PO TABS: 40.0000 mg | ORAL_TABLET | Freq: Every day | ORAL | Status: DC

## 2011-05-13 NOTE — Telephone Encounter (Signed)
Rx refill sent to pharmacy. 

## 2011-05-13 NOTE — Progress Notes (Signed)
Subjective:    Patient ID: [name], female    DOB: [birth year], 70 y.o.   MRN: [id number]  HPI  [name] is a 70 yr old female who presents today with chief complaint of diarrhea.  Symptoms started 6 days ago.  She has had 3 BM's since getting up this AM (it is now 9AM).  Denies bloody stool- stool is green.  She has associated LLQ tenderness and cramping. Denies associated fever.  No improvement with immodium.     Review of Systems See HPI  Past Medical History  Diagnosis Date  . Hyperplastic colon polyp   . Hypertension   . Diabetes mellitus type II   . Osteoarthritis   . GERD (gastroesophageal reflux disease)   . Osteopenia   . Lupus     of skin see dermatologist frequently  . 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 OA (osteoarthritis of spine)     C-spine  . DVT (deep venous thrombosis)     right leg DVT 05-17-2008, to d/c coumadin 11/09  . Leg swelling     left leg 2-10, u/s showed a old clot, has a hypercoag panel, f/u by hematology  . History of cardiovascular stress test     10/09 had CP, stress test neg, likely GI (GERD)-09/2006-had CP: stress test (-)    History   Social History  . Marital Status: Widowed    Spouse Name: N/A    Number of Children: 3  . Years of Education: N/A   Occupational History  . Retired    Social History Main Topics  . Smoking status: Never Smoker   . Smokeless tobacco: Not on file  . Alcohol Use: No  . Drug Use: No  . Sexually Active: Not Currently   Other Topics Concern  . Not on file   Social History Narrative   Single-widow 3 children , 1 in [place] (son, daughter - [place]) [place] to [place] 2002 from [place], [employer] Use - no   tobacco-- never  Illicit Drug Use - no Pt is Jehovah's Witness-No blood productsDaughter - [name]     Past Surgical History  Procedure Date  . Cholecystectomy   . Breast biopsy     left breast  .  Breast lumpectomy     left breast-ductal carcinoma in situ  . Appendectomy     Family History  Problem Relation Age of Onset  . Diabetes Mother   . Diabetes    . Heart attack Neg Hx   . Colon cancer Neg Hx   . Breast cancer Neg Hx     Allergies  Allergen Reactions  . Fluconazole     REACTION: severe fatigue and muscle weakness  . Metformin     REACTION: gi sxs  . Pioglitazone     REACTION: wt gain    Current Outpatient Prescriptions on File Prior to Visit  Medication Sig Dispense Refill  . Calcium Carbonate-Vitamin D (CALCIUM-VITAMIN D) 600-200 MG-UNIT CAPS Take by mouth.        . Cholecalciferol (VITAMIN D3) 1000000 UNIT/GM LIQD Take 2,000 Units by mouth. 2,000 international untis (one drop) by mouth once daily at bedtime       . cyclobenzaprine (FLEXERIL) 5 MG tablet Take 1 tablet (5 mg total) by mouth 2 (two) times daily as needed.  30 tablet  0  . fish oil-omega-3 fatty acids  1000 MG capsule Take 2 g by mouth daily.        . furosemide (LASIX) 20 MG tablet Take 1 tablet (20 mg total) by mouth daily.  30 tablet  5  . Garlic 500 MG CAPS Take 500 mg by mouth 2 (two) times daily.        . Glucosamine-Chondroitin 1500-1200 MG/30ML LIQD Take by mouth 2 (two) times daily.        Marland Kitchen glucose blood (FREESTYLE LITE) test strip 1 each by Other route as needed. Use as instructed to check blood sugar four times daily (250.00)      . hydrochlorothiazide (,MICROZIDE/HYDRODIURIL,) 12.5 MG capsule Take 12.5 mg by mouth daily.        . insulin lispro protamine-insulin lispro (HUMALOG 75/25) (75-25) 100 UNIT/ML SUSP Inject into the skin 2 (two) times daily with a meal. Sliding scale as directed      . lisinopril (PRINIVIL,ZESTRIL) 40 MG tablet Take 40 mg by mouth daily.        . Misc Natural Products (GLUCOS-CHONDROIT-MSM COMPLEX) TABS Take by mouth 2 (two) times daily.        . mometasone (ELOCON) 0.1 % cream Apply topically daily.        . Multiple Vitamin (MULTIVITAMIN) tablet Take 1 tablet  by mouth daily.        Marland Kitchen neomycin-polymyxin-hydrocortisone (CORTISPORIN) 0.5 % cream Apply 3.5 % topically 2 (two) times daily.        . niacin 500 MG tablet Take 500 mg by mouth daily with breakfast.        . Omega 3-6-9 Fatty Acids (OMEGA 3-6-9 COMPLEX PO) Take by mouth daily.        . ciprofloxacin (CIPRO) 500 MG tablet Take 500 mg by mouth 2 (two) times daily.        . Insulin Pen Needle (PEN NEEDLES 29GX1/2") 29G X MISC by Does not apply route. Use three times a day for insulin injection       . metroNIDAZOLE (FLAGYL) 500 MG tablet Take 500 mg by mouth 3 (three) times daily.        . polyethylene glycol (MIRALAX / GLYCOLAX) packet Take 17 g by mouth daily.        . Polyethylene Glycol 3350 POWD by Does not apply route once. One capful by mouth once daily(mixed with 8oz of water)         BP 150/70  Pulse 80  Temp(Src) 98 F (36.7 C) (Oral)  Resp 20  Wt 206 lb (93.441 kg)       Objective:   Physical Exam General: Awake, alert, white female in no acute distress Cardiovascular: S1, S2, regular rate and rhythm Respiratory: Breath sounds are clear to auscultation bilaterally without wheezes rales or rhonchi Abdomen: Abdomen is soft, positive bowel sounds are noted, nondistended. No palpable masses. She is noted to have some left lower cord or tenderness to deep palpation without guarding or rebound tenderness. Psychiatric: Patient is awake and alert and oriented x4, calm and pleasant.       Assessment & Plan:

## 2011-05-13 NOTE — Assessment & Plan Note (Signed)
CT performed today consistent with mild colitis. Stool studies pending.  Plan to start empiric flagyl in the meantime.  Plan was discussed with Dr. Juanda Chance who agrees with empiric flagyl.  Pt to f/u with Dr. Juanda Chance next week for office visit.

## 2011-05-13 NOTE — Patient Instructions (Signed)
Please complete your lab work on the first floor and obtain specimen cups from the lab. Return this afternoon to complete your CT scan- we will contact you with your results. Follow up in 1 week, call if worsening abdominal pain, fever. Drink plenty of fluids and continue to hold your furosemide until the diarrhea stops.

## 2011-05-13 NOTE — Telephone Encounter (Signed)
Reviewed

## 2011-05-14 ENCOUNTER — Telehealth: Payer: Self-pay | Admitting: *Deleted

## 2011-05-14 NOTE — Telephone Encounter (Signed)
Call placed to patient [phone], no answer; a detailed voice message was left for patient to return phone call.

## 2011-05-14 NOTE — Telephone Encounter (Signed)
Patient returned call, and she was advised per Melissa O. instructions and has verbalized understanding.  She is requesting the results from the CT scan

## 2011-05-14 NOTE — Telephone Encounter (Signed)
Kim with [employer]  Lab call to report that patient stool is positive for C-Difficile.

## 2011-05-14 NOTE — Telephone Encounter (Signed)
Reviewed CT, plan to add empiric flagyl.  Pt was informed.

## 2011-05-14 NOTE — Telephone Encounter (Signed)
Please call patient and let her know that her stool is positive for C. Diff.  This is bacterial overgrowth due to recent use of antibiotics.  She should continue metronidazole that I prescribed yesterday.  She should stop anti-diarrheal medications.  Diarrhea should improve on its own in the next few days.

## 2011-05-14 NOTE — Telephone Encounter (Signed)
Pls let pt know that her CT shows mild colitis- this is consistent with finding of C. Diff.

## 2011-05-15 ENCOUNTER — Telehealth: Payer: Self-pay | Admitting: Internal Medicine

## 2011-05-15 MED ORDER — DICYCLOMINE HCL 10 MG PO CAPS: ORAL_CAPSULE | ORAL | Status: DC

## 2011-05-15 MED ORDER — SACCHAROMYCES BOULARDII 250 MG PO CAPS: ORAL_CAPSULE | ORAL | Status: DC

## 2011-05-15 NOTE — Telephone Encounter (Signed)
NO, WITH CDIFF WE DONT USE ANTIDIARRHEALS BECAUSE IT CAN MAKE IT HARDER TO CLEAR THE ORGANISM. SHE  SHOULD ADD FLORASTOR TWICE DAILY,  COULD GIVE HER SOME BENTYL 10 MG 3 X DAILY FOR CRAMPING WHICH MAY SLOW HER DOWN.  SHE NEEDS TO BE AWARE CDIFF IS A SERIOUS INFECTION, AND SHE MAY NEED TO STAY HOME, TO TAKE CARE OF HERSELF. IT MAY TAKE SEVERAL DAYS FOR THE DIARRHEA TO IMPROVE. PUSH FLUIDS.

## 2011-05-15 NOTE — Telephone Encounter (Signed)
Call placed to patient at [phone], no answer, no voice mail; unable to leave message

## 2011-05-15 NOTE — Telephone Encounter (Signed)
Pt had a UTI from E COLI and was given Cipro 04/26/11. Pt now has diarrhea and is positive for CDiff . Dr Olegario Messier ofc started her on Flagyl 500mg  tid x 10day on 05/14/11. Pt is still bothered by the diarrhea, sees Dr Juanda Chance on Monday for COLON consult. Pt states she has tried Imodium per the box and it doesn't help. Can we order something for the diarrhea? Thanks.

## 2011-05-15 NOTE — Telephone Encounter (Signed)
Pt called

## 2011-05-15 NOTE — Telephone Encounter (Signed)
Pt called stating she is still experiencing multiple diarrhea episodes. Has taken 1 full day of antibiotic (flagyl). States that she wants something that is going to stop her diarrhea. Advised pt she may need to give flagyl more time to work. Pt still expresses concern. Advised pt to contact Dr Regino Schultze office and see if she can get an appt with them this week. Number was given to pt to call Dr Juanda Chance.

## 2011-05-15 NOTE — Telephone Encounter (Signed)
Notified pt of Amy's suggestions: no Imodium;  start Florastor bid and take with the Flagyl; Bentyl 10mg  tid prn for cramping and it may slow down the diarrhea. I explained CDIFF is a serious infection and it may take awhile for the diarrhea to improve; she may need to stay at home and take good care of herself. Pt may call me again if she has questions about her meds and administration. Pt stated understanding.

## 2011-05-16 ENCOUNTER — Ambulatory Visit: Payer: Medicare Other | Admitting: Family Medicine

## 2011-05-17 LAB — STOOL CULTURE

## 2011-05-19 ENCOUNTER — Encounter: Payer: Self-pay | Admitting: Internal Medicine

## 2011-05-19 ENCOUNTER — Ambulatory Visit (INDEPENDENT_AMBULATORY_CARE_PROVIDER_SITE_OTHER): Payer: Medicare Other | Admitting: Internal Medicine

## 2011-05-19 VITALS — BP 128/64 | HR 88 | Ht 67.0 in | Wt 203.0 lb

## 2011-05-19 DIAGNOSIS — D689 Coagulation defect, unspecified: Secondary | ICD-10-CM

## 2011-05-19 DIAGNOSIS — R197 Diarrhea, unspecified: Secondary | ICD-10-CM

## 2011-05-19 NOTE — Progress Notes (Signed)
[name] 07-21-1941 MRN [id number]        History of Present Illness:  This is a 70 year old, white female here to discuss recall colonoscopy. She is currently on Lovenox for lower extremity phlebitis. She has a history of tubular adenomax 2  on colonoscopy in 2006 and a hyperplastic polyp on colonoscopy in March 2007.She is a Jehovah witness. 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 She had breast cancer in 2004. CT scan of the abdomen in June 2006 showed nonspecific thickening of the left colon. She has occasional diarrhea likely related to antibiotics for urinary tract infections ,She is  on Flagyl 500 mg 3 times a day and diarrhea has almost  subsided. However Flagyl has caused her to be nauseated.   Past Medical History  Diagnosis Date  . Hyperplastic colon polyp   . Hypertension   . Diabetes mellitus type II   . Osteoarthritis   . GERD (gastroesophageal reflux disease)   . Osteopenia   . Lupus     of skin see dermatologist frequently  . 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 OA (osteoarthritis of spine)     C-spine  . DVT (deep venous thrombosis)     right leg DVT 05-17-2008, to d/c coumadin 11/09  . Leg swelling     left leg 2-10, u/s showed a old clot, has a hypercoag panel, f/u by hematology  . History of cardiovascular stress test     10/09 had CP, stress test neg, likely GI (GERD)-09/2006-had CP: stress test (-)   Past Surgical History  Procedure Date  . Cholecystectomy   . Breast biopsy     left breast  . Breast lumpectomy     left breast-ductal carcinoma in situ  . Appendectomy     reports that she has never smoked. She does not have any smokeless tobacco history on file. She reports that she does not drink alcohol or use illicit drugs. family history includes Diabetes in her mother and  unspecified family member.  There is no history of Heart attack, and Colon cancer, and Breast cancer, . Allergies  Allergen Reactions  . Fluconazole     REACTION: severe fatigue and muscle weakness  . Metformin     REACTION: gi sxs  . Pioglitazone     REACTION: wt gain        Review of Systems: Denies dysphagia, odynophagia, chest pain, bowel habits improved almost metronidazole. Denies rectal bleeding  The remainder of the 10  point ROS is negative except as outlined in H&P   Physical Exam: General appearance  Well developed in no distress Eyes- non icteric HEENT nontraumatic, normocephalic Mouth no lesions, tongue papillated, no cheilosis Neck supple without adenopathy, thyroid not enlarged, no carotid bruits, no JVD Lungs Clear to auscultation bilaterally Cor normal S1 normal S2, regular rhythm , no murmur,  quiet precordium Abdomen soft with mild tenderness in left lower quadrant. No palpable mass or fullness. Normal active bowel sounds. No distention Rectal: Did not want to have a rectal exam due to the fact that she had her stools checked recently by [name] Extremities 1+ pedal edema Skin no lesions Neurological alert and oriented x3 Psychological normal mood and  affect  Assessment and Plan:  Problems #1 deep venous thrombosis and phlebitis. Currently  on Lovenox. This is at risk for elective procedures such as colonoscopy. For that reason I would postpone colonoscopy  Problem #2 history of adenomatous polyp and a hyperplastic polyp in 2007. The recall colonoscopy should  Actually be  in 7 years which would be in March 2014. He have discussed the fact that it would be safer for her  to wait with the colonoscopy until  her phlebitis has resolved. We will send her a recall letter for  March 2014  Problem r #3 antibiotic-related diarrhea. Responding to Flagyl 500 mg 3 times a day. Because of the nausea and general  improvement in her status she may reduce her Flagyl to  250 mg 3 times a day   05/19/2011 [name]

## 2011-05-20 ENCOUNTER — Telehealth: Payer: Self-pay | Admitting: Internal Medicine

## 2011-05-20 NOTE — Telephone Encounter (Signed)
Patient has been advised by Pearletha Furl.

## 2011-05-20 NOTE — Telephone Encounter (Signed)
Pt would like CT results

## 2011-05-26 ENCOUNTER — Emergency Department (HOSPITAL_BASED_OUTPATIENT_CLINIC_OR_DEPARTMENT_OTHER)
Admission: EM | Admit: 2011-05-26 | Discharge: 2011-05-26 | Disposition: A | Discharge status: Home / Self Care | Payer: Medicare Other | Attending: Emergency Medicine | Admitting: Emergency Medicine

## 2011-05-26 ENCOUNTER — Encounter (HOSPITAL_BASED_OUTPATIENT_CLINIC_OR_DEPARTMENT_OTHER): Payer: Self-pay | Admitting: Family Medicine

## 2011-05-26 ENCOUNTER — Ambulatory Visit: Payer: Medicare Other | Admitting: Internal Medicine

## 2011-05-26 DIAGNOSIS — M7989 Other specified soft tissue disorders: Secondary | ICD-10-CM | POA: Insufficient documentation

## 2011-05-26 DIAGNOSIS — E860 Dehydration: Secondary | ICD-10-CM

## 2011-05-26 DIAGNOSIS — I10 Essential (primary) hypertension: Secondary | ICD-10-CM | POA: Insufficient documentation

## 2011-05-26 DIAGNOSIS — Z86718 Personal history of other venous thrombosis and embolism: Secondary | ICD-10-CM | POA: Insufficient documentation

## 2011-05-26 DIAGNOSIS — R739 Hyperglycemia, unspecified: Secondary | ICD-10-CM

## 2011-05-26 DIAGNOSIS — E119 Type 2 diabetes mellitus without complications: Secondary | ICD-10-CM | POA: Insufficient documentation

## 2011-05-26 DIAGNOSIS — R197 Diarrhea, unspecified: Secondary | ICD-10-CM | POA: Insufficient documentation

## 2011-05-26 LAB — CBC
MCH: 27.2 pg (ref 26.0–34.0)
MCHC: 33.1 g/dL (ref 30.0–36.0)
Platelets: 192 10*3/uL (ref 150–400)
RBC: 4.34 MIL/uL (ref 3.87–5.11)
RDW: 13.6 % (ref 11.5–15.5)

## 2011-05-26 LAB — BASIC METABOLIC PANEL
BUN: 20 mg/dL (ref 6–23)
Calcium: 9.9 mg/dL (ref 8.4–10.5)
GFR calc non Af Amer: >60 mL/min (ref >60)
Glucose, Bld: 335 mg/dL — ABNORMAL HIGH (ref 70–99)
Sodium: 136 mEq/L (ref 135–145)

## 2011-05-26 LAB — GLUCOSE, CAPILLARY: Glucose-Capillary: 164 mg/dL — ABNORMAL HIGH (ref 70–99)

## 2011-05-26 MED ORDER — SODIUM CHLORIDE 0.9 % IV BOLUS (SEPSIS)
1000.0000 mL | Freq: Once | INTRAVENOUS | Status: AC
  Administered 2011-05-26: 1000 mL via INTRAVENOUS

## 2011-05-26 MED ORDER — SODIUM CHLORIDE 0.9 % IV SOLN
Freq: Once | INTRAVENOUS | Status: AC
  Administered 2011-05-26: 14:00:00 via INTRAVENOUS

## 2011-05-26 NOTE — ED Notes (Signed)
Pt. Left prior to receiving discharge paperwork. Pt received oral discharge instructions via Dr. Ethelda Chick.

## 2011-05-26 NOTE — ED Provider Notes (Signed)
History   c/o dirrheaa onset 1 month ago statrted flagyl   9 days ago without relief  Chief Complaint  Patient presents with  . Diarrhea   Patient is a 70 y.o. female presenting with diarrhea. The history is provided by the patient.  Diarrhea The primary symptoms include abdominal pain and diarrhea. Primary symptoms do not include fever, nausea or vomiting. The problem has not changed since onset. The illness does not include chills.   No blood per rectum statred oon flagyl 9 days ago without relief had 4 soft bms today Past Medical History  Diagnosis Date  . Hyperplastic colon polyp   . Hypertension   . Diabetes mellitus type II   . Osteoarthritis   . GERD (gastroesophageal reflux disease)   . Osteopenia   . Lupus     of skin see dermatologist frequently  . 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 OA (osteoarthritis of spine)     C-spine  . DVT (deep venous thrombosis)     right leg DVT 05-17-2008, to d/c coumadin 11/09  . Leg swelling     left leg 2-10, u/s showed a old clot, has a hypercoag panel, f/u by hematology  . History of cardiovascular stress test     10/09 had CP, stress test neg, likely GI (GERD)-09/2006-had CP: stress test (-)    Past Surgical History  Procedure Date  . Cholecystectomy   . Breast biopsy     left breast  . Breast lumpectomy     left breast-ductal carcinoma in situ  . Appendectomy     Family History  Problem Relation Age of Onset  . Diabetes Mother   . Diabetes    . Heart attack Neg Hx   . Colon cancer Neg Hx   . Breast cancer Neg Hx     History  Substance Use Topics  . Smoking status: Never Smoker   . Smokeless tobacco: Not on file  . Alcohol Use: No    OB History    Grav Para Term Preterm Abortions TAB SAB Ect Mult Living                  Review of Systems  Constitutional: Negative for fever and chills.       Generalized weakness  HENT:  Negative.   Respiratory: Negative.   Cardiovascular: Negative.   Gastrointestinal: Positive for abdominal pain and diarrhea. Negative for nausea, vomiting, blood in stool, abdominal distention and rectal pain.  Genitourinary: Negative.   Musculoskeletal: Negative.   Skin: Negative.   Neurological: Positive for weakness.  Hematological: Negative.   Psychiatric/Behavioral: Negative.     Physical Exam  BP 126/45  Pulse 78  Temp(Src) 99.1 F (37.3 C) (Oral)  Resp 18  SpO2 96%  Physical Exam  Constitutional: She appears well-developed and well-nourished.  HENT:  Head: Normocephalic and atraumatic.  Eyes: Conjunctivae are normal. Pupils are equal, round, and reactive to light.  Neck: Neck supple. No tracheal deviation present. No thyromegaly present.  Cardiovascular: Normal rate and regular rhythm.   No murmur heard. Pulmonary/Chest: Effort normal and breath sounds normal.  Abdominal: Soft. Bowel sounds are normal. She exhibits no distension. There is no tenderness.  Musculoskeletal: Normal range of motion. She exhibits no edema and no tenderness.  Neurological: She is alert. Coordination normal.  Skin: Skin is warm and dry. No rash noted.  Psychiatric:  She has a normal mood and affect.    ED Course  Procedures Spoke with Sandford Craze , will see pt in office this week MDM 4pm feeel much improved and ready to go home. No further episodes diarrhea while in the ed c.plan avoid milk and milk poroducts ,continue prewnt medications      Doug Sou, MD 05/26/11 1609

## 2011-05-26 NOTE — ED Notes (Signed)
Pt sts she feels "better" and sts she is finally hungry. Pt dc'ed home with family.

## 2011-05-27 ENCOUNTER — Telehealth: Payer: Self-pay | Admitting: Family

## 2011-05-27 NOTE — Telephone Encounter (Signed)
Called pt for ED follow up.  Was seen yesterday in ED with diarrhea. She was hydrated.  She reports no further diarrhea today. 1 BM this AM which was soft- not diarrhea.  Still feels weak, continues flagyl.  I recommended that she call if recurrent diarrhea, if weakness worsens/does not improve or if fever. She verbalizes understanding.  She will plan to follow up on Friday 7/13.

## 2011-05-27 NOTE — Telephone Encounter (Signed)
Diarrhea resolved.  Had one stool this AM

## 2011-05-30 ENCOUNTER — Encounter: Payer: Self-pay | Admitting: Family

## 2011-05-30 ENCOUNTER — Ambulatory Visit (INDEPENDENT_AMBULATORY_CARE_PROVIDER_SITE_OTHER): Payer: Medicare Other | Admitting: Family

## 2011-05-30 VITALS — BP 130/70 | HR 84 | Temp 97.8°F | Resp 16 | Ht 67.0 in | Wt 205.0 lb

## 2011-05-30 DIAGNOSIS — A0472 Enterocolitis due to Clostridium difficile, not specified as recurrent: Secondary | ICD-10-CM | POA: Insufficient documentation

## 2011-05-30 DIAGNOSIS — R609 Edema, unspecified: Secondary | ICD-10-CM

## 2011-05-30 DIAGNOSIS — D649 Anemia, unspecified: Secondary | ICD-10-CM

## 2011-05-30 DIAGNOSIS — R300 Dysuria: Secondary | ICD-10-CM

## 2011-05-30 LAB — IRON AND TIBC
%SAT: 37 % (ref 20–55)
TIBC: 271 ug/dL (ref 250–470)

## 2011-05-30 MED ORDER — IBUPROFEN 800 MG PO TABS: ORAL_TABLET | ORAL | Status: DC

## 2011-05-30 MED ORDER — HYDROCHLOROTHIAZIDE 25 MG PO TABS: 25.0000 mg | ORAL_TABLET | Freq: Every day | ORAL | Status: DC

## 2011-05-30 NOTE — Progress Notes (Signed)
Subjective:    Patient ID: [name], female    DOB: 1941-09-22, 70 y.o.   MRN: [id number]  HPI  [name] is a 70 year old female who presents today for followup of her Clostridium difficile colitis. She continues on metronidazole 250 mg by mouth 3 times daily.  Diarrhea-  "gone." Now more formed.  Having 5-6 bowel movements a day.  Has 2 days remaining of her flagyl.  She continues the Bentyl which was prescribed by Dr. Juanda Chance.    Edema-She reports not tolerating the lasix due to frequency of urination and occasional urinary incontinence. This was prescribed in place of her hydrochlorothiazide 12.5 mg daily due to her lower extremity edema.  Dysuria- this is new, reports no fever, and no hematuria.   Review of Systems See history of present illness  Past Medical History  Diagnosis Date  . Hyperplastic colon polyp   . Hypertension   . Diabetes mellitus type II   . Osteoarthritis   . GERD (gastroesophageal reflux disease)   . Osteopenia   . Lupus     of skin see dermatologist frequently  . 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 OA (osteoarthritis of spine)     C-spine  . DVT (deep venous thrombosis)     right leg DVT 05-17-2008, to d/c coumadin 11/09  . Leg swelling     left leg 2-10, u/s showed a old clot, has a hypercoag panel, f/u by hematology  . History of cardiovascular stress test     10/09 had CP, stress test neg, likely GI (GERD)-09/2006-had CP: stress test (-)    History   Social History  . Marital Status: Widowed    Spouse Name: N/A    Number of Children: 3  . Years of Education: N/A   Occupational History  . Retired    Social History Main Topics  . Smoking status: Never Smoker   . Smokeless tobacco: Not on file  . Alcohol Use: No  . Drug Use: No  . Sexually Active: Not Currently   Other Topics Concern  . Not on file   Social History Narrative   Single-widow 3  children , 1 in [place] (son, daughter - ) [place] to [place] 2002 from [place], [employer] Use - no   tobacco-- never  Illicit Drug Use - no Pt is Jehovah's Witness-No blood productsDaughter - [name]     Past Surgical History  Procedure Date  . Cholecystectomy   . Breast biopsy     left breast  . Breast lumpectomy     left breast-ductal carcinoma in situ  . Appendectomy     Family History  Problem Relation Age of Onset  . Diabetes Mother   . Diabetes    . Heart attack Neg Hx   . Colon cancer Neg Hx   . Breast cancer Neg Hx     Allergies  Allergen Reactions  . Fluconazole Other (See Comments)    REACTION: severe fatigue and muscle weakness  . Metformin Diarrhea    REACTION: gi sxs  . Pioglitazone Other (See Comments)    REACTION: wt gain    Current Outpatient Prescriptions on File Prior to Visit  Medication Sig Dispense Refill  . Calcium Carbonate-Vitamin D (CALCIUM-VITAMIN D) 600-200 MG-UNIT CAPS Take 1 tablet by mouth 2 (two) times daily.       . Cholecalciferol (VITAMIN D3)  1000000 UNIT/GM LIQD Take 2,000 Units by mouth. 2,000 international untis (one drop) by mouth once daily at bedtime       . cyclobenzaprine (FLEXERIL) 5 MG tablet Take 1 tablet (5 mg total) by mouth 2 (two) times daily as needed.  30 tablet  0  . dicyclomine (BENTYL) 10 MG capsule Take 1 capsule by mouth up to 3 times daily while taking Flagyl to help with cramping and to help slow down the bacteria.  60 capsule  0  . fish oil-omega-3 fatty acids 1000 MG capsule Take 2 g by mouth daily.        . Garlic 500 MG CAPS Take 500 mg by mouth 2 (two) times daily.        Marland Kitchen glucosamine-chondroitin 500-400 MG tablet Take 1 tablet by mouth 2 (two) times daily.        Marland Kitchen glucose blood (FREESTYLE LITE) test strip 1 each by Other route as needed. Use as instructed to check blood sugar four times daily (250.00)      . insulin lispro protamine-insulin lispro (HUMALOG 75/25) (75-25) 100 UNIT/ML SUSP Inject into  the skin 2 (two) times daily with a meal. Sliding scale as directed      . Insulin Pen Needle (PEN NEEDLES 29GX1/2") 29G X MISC by Does not apply route. Use three times a day for insulin injection       . Lactobacillus (ACIDOPHILUS PO) Take by mouth daily.        Marland Kitchen lisinopril (PRINIVIL,ZESTRIL) 40 MG tablet Take 1 tablet (40 mg total) by mouth daily.  90 tablet  1  . metroNIDAZOLE (FLAGYL) 500 MG tablet Take 250 mg by mouth 3 (three) times daily.        . Multiple Vitamin (MULTIVITAMIN) tablet Take 1 tablet by mouth daily.       . niacin 500 MG tablet Take 500 mg by mouth daily with breakfast.        . Polyethylene Glycol 3350 POWD by Does not apply route once. One capful by mouth once daily(mixed with 8oz of water)       . mometasone (ELOCON) 0.1 % cream Apply topically daily.          BP 130/70  Pulse 84  Temp(Src) 97.8 F (36.6 C) (Oral)  Resp 16  Ht 5\' 7"  (1.702 m)  Wt 205 lb 0.6 oz (93.006 kg)  BMI 32.11 kg/m2       Objective:   Physical Exam  Constitutional: She appears well-developed and well-nourished.  Cardiovascular: Normal rate and regular rhythm.   Pulmonary/Chest: Effort normal and breath sounds normal.  Abdominal: Soft. Bowel sounds are normal. She exhibits no distension. There is no tenderness.  Musculoskeletal:       2+ bilateral lower extremity edema is noted.  Psychiatric: She has a normal mood and affect. Her speech is normal and behavior is normal. Judgment and thought content normal. Cognition and memory are normal.          Assessment & Plan:  Records from recent ED visit were reviewed.

## 2011-05-30 NOTE — Assessment & Plan Note (Signed)
She is not tolerating the furosemide to 2 frequency of urination and some occasional urinary incontinence. I have advised her to discontinue the furosemide and in its place start hydrochlorothiazide 25 mg by mouth daily and set up a 12.5 mg which she was taking prior to the furosemide.

## 2011-05-30 NOTE — Assessment & Plan Note (Signed)
She was noted to have a mild anemiawith a hemoglobin of 11.8. Will check anemia studies.

## 2011-05-30 NOTE — Patient Instructions (Addendum)
Finish the flagyl. Continue the Bentyl as needed. Call if the stool frequency does not continue to improve or if your diarrhea returns.   Follow up in 1 month.

## 2011-05-30 NOTE — Assessment & Plan Note (Addendum)
This is slowly improving. She was seen in the emergency room earlier this week. Lab work was essentially unremarkable during that visit. She notes that she still feels somewhat fatigued. She has requested that we fill out a handicapped placard application for her which I have done today. She has 2 additional days remaining of her Flagyl. She continues to use Bentyl on a when necessary basis. Stool is now more formed however she notes that it is still soft and the frequency is more than she wishes. I advised her that I would review her case again with Dr. Julio Alm and that I suspect no further changes in her treatment at this time as it may take several weeks for her bowels to return to normal. We will check a TSH today to exclude hypothyroidism as a contributing cause to her fatigue. I suspect however that her fatigue is related to her recent infection. Reassurance was provided that this should continue to improve over the next several weeks, however she is instructed to contact us should her fatigue worsen or fail to improve. She verbalizes understanding. In addition her lab work from her recent trip to the emergency department was reviewed with her.

## 2011-06-02 ENCOUNTER — Telehealth: Payer: Self-pay | Admitting: Family

## 2011-06-02 LAB — URINE CULTURE: Colony Count: >=100000

## 2011-06-02 MED ORDER — CHOLESTYRAMINE 4 GM/DOSE PO POWD: ORAL | Status: DC

## 2011-06-02 MED ORDER — SULFAMETHOXAZOLE-TRIMETHOPRIM 400-80 MG PO TABS: 1.0000 | ORAL_TABLET | Freq: Two times a day (BID) | ORAL | Status: AC

## 2011-06-02 MED ORDER — VANCOMYCIN HCL 250 MG PO CAPS: ORAL_CAPSULE | ORAL | Status: DC

## 2011-06-02 NOTE — Telephone Encounter (Signed)
Per [facility] pharmacy:   We do not stock this. Not used in [employer].   Vancomycin(vancocin) 250mg  capsule. One tab 4 times daily x 2 weeks, then 3x daily for 1 week, then 2x daily for two weeks, then once daily for 1 week. Qty 70 capsule. Date written 7.16.12

## 2011-06-02 NOTE — Telephone Encounter (Signed)
Pt called requesting lab results from Friday. Please advise.

## 2011-06-02 NOTE — Telephone Encounter (Signed)
Spoke with patient re: rx for vanc sent to [facility].  She gave me permission to talk with her Daughter. Called daughter on her cell phone at [phone].  No answer.  Left message for her to return my call.

## 2011-06-02 NOTE — Telephone Encounter (Signed)
F/u with Dr Juanda Chance scheduled for 06/30/11 at 3:15pm. Follow up with Efraim Kaufmann has been scheduled for 06/16/11 at 9:15am and pt is aware of both appts.

## 2011-06-02 NOTE — Telephone Encounter (Addendum)
Spoke to Dr. Juanda Chance, reviewed pt's case in regards to ongoing diarrhea.  She recommended Vancomycin 250mg  4 times daily x 2 weekes, then 3x daily for 1 week, then twice daily for two weeks then once daily for 1 week.  She also recommended the addition of questran.  We discussed that pt also has UTI- will avoid use of cipro as pt was treated with this prior to c. Diff infection.  I called patient, reviewed above with her.  She reports that stool pattern in unchanged. I reviewed common side effects of vancomycin with her: nausea, rash, flatulence.Trish, would you please call patient back and arrange a follow up appointement for about 2 weeks with me.  Please call GI and have them arrange a follow up with Dr. Juanda Chance in a few weeks also.

## 2011-06-02 NOTE — Telephone Encounter (Signed)
Notified pt, she is very concerned as pharmacist told her this was only to be given in the hospital and would not do her any good if she took it by mouth and it would cause a lot of problems. Transferred pt to [facility] to discuss concerns. Also received call from pt's daughter very upset that her mother is still sick and having problems with medication and doesn't seem to understand what is happening. Pt's daughter, Shawniece can be reached at [phone].

## 2011-06-02 NOTE — Telephone Encounter (Signed)
Pls cancel at [facility]. Call pt and let her know that I have sent Rx to [facility] on [address].  They have med in stock.

## 2011-06-02 NOTE — Telephone Encounter (Signed)
Please advise 

## 2011-06-03 ENCOUNTER — Telehealth: Payer: Self-pay | Admitting: Internal Medicine

## 2011-06-03 ENCOUNTER — Ambulatory Visit (INDEPENDENT_AMBULATORY_CARE_PROVIDER_SITE_OTHER): Payer: Medicare Other | Admitting: Family

## 2011-06-03 ENCOUNTER — Encounter: Payer: Self-pay | Admitting: Family

## 2011-06-03 ENCOUNTER — Inpatient Hospital Stay (HOSPITAL_COMMUNITY)
Admission: AD | Admit: 2011-06-03 | Discharge: 2011-06-09 | DRG: 373 | Disposition: A | Discharge status: Home Health | Payer: Medicare Other | Source: Ambulatory Visit | Attending: Internal Medicine | Admitting: Internal Medicine

## 2011-06-03 DIAGNOSIS — Z860100 Personal history of colon polyps, unspecified: Secondary | ICD-10-CM

## 2011-06-03 DIAGNOSIS — A09 Infectious gastroenteritis and colitis, unspecified: Secondary | ICD-10-CM

## 2011-06-03 DIAGNOSIS — K219 Gastro-esophageal reflux disease without esophagitis: Secondary | ICD-10-CM | POA: Diagnosis present

## 2011-06-03 DIAGNOSIS — M1990 Unspecified osteoarthritis, unspecified site: Secondary | ICD-10-CM | POA: Diagnosis present

## 2011-06-03 DIAGNOSIS — T7840XA Allergy, unspecified, initial encounter: Secondary | ICD-10-CM | POA: Diagnosis present

## 2011-06-03 DIAGNOSIS — L930 Discoid lupus erythematosus: Secondary | ICD-10-CM | POA: Diagnosis present

## 2011-06-03 DIAGNOSIS — T465X5A Adverse effect of other antihypertensive drugs, initial encounter: Secondary | ICD-10-CM | POA: Diagnosis present

## 2011-06-03 DIAGNOSIS — Z853 Personal history of malignant neoplasm of breast: Secondary | ICD-10-CM

## 2011-06-03 DIAGNOSIS — F29 Unspecified psychosis not due to a substance or known physiological condition: Secondary | ICD-10-CM | POA: Diagnosis present

## 2011-06-03 DIAGNOSIS — T380X5A Adverse effect of glucocorticoids and synthetic analogues, initial encounter: Secondary | ICD-10-CM | POA: Diagnosis present

## 2011-06-03 DIAGNOSIS — Z794 Long term (current) use of insulin: Secondary | ICD-10-CM

## 2011-06-03 DIAGNOSIS — N390 Urinary tract infection, site not specified: Secondary | ICD-10-CM

## 2011-06-03 DIAGNOSIS — IMO0001 Reserved for inherently not codable concepts without codable children: Secondary | ICD-10-CM | POA: Diagnosis present

## 2011-06-03 DIAGNOSIS — T368X5A Adverse effect of other systemic antibiotics, initial encounter: Secondary | ICD-10-CM | POA: Diagnosis present

## 2011-06-03 DIAGNOSIS — I10 Essential (primary) hypertension: Secondary | ICD-10-CM | POA: Diagnosis present

## 2011-06-03 DIAGNOSIS — Z8673 Personal history of transient ischemic attack (TIA), and cerebral infarction without residual deficits: Secondary | ICD-10-CM

## 2011-06-03 DIAGNOSIS — A0472 Enterocolitis due to Clostridium difficile, not specified as recurrent: Secondary | ICD-10-CM

## 2011-06-03 DIAGNOSIS — M899 Disorder of bone, unspecified: Secondary | ICD-10-CM | POA: Diagnosis present

## 2011-06-03 DIAGNOSIS — G4733 Obstructive sleep apnea (adult) (pediatric): Secondary | ICD-10-CM | POA: Diagnosis present

## 2011-06-03 LAB — COMPREHENSIVE METABOLIC PANEL
Albumin: 3.3 g/dL — ABNORMAL LOW (ref 3.5–5.2)
BUN: 15 mg/dL (ref 6–23)
Creatinine, Ser: 0.74 mg/dL (ref 0.50–1.10)
GFR calc Af Amer: >60 mL/min (ref >60)
Glucose, Bld: 251 mg/dL — ABNORMAL HIGH (ref 70–99)
Total Protein: 7.1 g/dL (ref 6.0–8.3)

## 2011-06-03 LAB — DIFFERENTIAL
Eosinophils Relative: 2 % (ref 0–5)
Lymphocytes Relative: 29 % (ref 12–46)
Lymphs Abs: 2.4 10*3/uL (ref 0.7–4.0)
Monocytes Absolute: 0.7 10*3/uL (ref 0.1–1.0)
Monocytes Relative: 9 % (ref 3–12)

## 2011-06-03 LAB — CBC
HCT: 35.1 % — ABNORMAL LOW (ref 36.0–46.0)
MCH: 27.8 pg (ref 26.0–34.0)
MCHC: 33.6 g/dL (ref 30.0–36.0)
MCV: 82.6 fL (ref 78.0–100.0)
RDW: 13.8 % (ref 11.5–15.5)

## 2011-06-03 LAB — MAGNESIUM: Magnesium: 2.1 mg/dL (ref 1.5–2.5)

## 2011-06-03 NOTE — Telephone Encounter (Signed)
Left message for patient's daughter to return my call.

## 2011-06-03 NOTE — Assessment & Plan Note (Signed)
Pt complained of dysuria on 7/13.  Her urine was cultured and grew >100K E. Coli.  She was started yesterday on bactrim. This will need to be continued during her hospitalization.

## 2011-06-03 NOTE — Telephone Encounter (Signed)
The new medicine Melissa prescribed has a copay of $1300.  She cannot afford that

## 2011-06-03 NOTE — Assessment & Plan Note (Addendum)
This is not improved after completion of metronidazole.  She needs to be treated with vancomycin. She cannot afford to have this filled as an outpatient. Pt is agreeable for admission to [facility].  I have discussed her case with her primary gastroenterologist (Dr. [name]) who is on call this week and who has agreed to see her in consultation.  Pt's daughter also presents with the patient today and her questions were answered.  She and patient are in agreement with her hospitalization at [facility]. Spoke to Dr Gerri Lins- [facility] who accepted patient to [facility] Team 2.  Spoke with Admitting, they will contact pt as soon as a bed becomes available.

## 2011-06-03 NOTE — Progress Notes (Signed)
Subjective:    Patient ID: [name], female    DOB: 01-13-1941, 70 y.o.   MRN: [id number]  HPI Ms.  [name] is a 70 yr old female who presents today for follow up of her C. Diff infection.  She was treated on 04/25/11 with cipro x 5 days for a UTI.  She presented on 6/22 with chief complaint of diarrhea.  Initially this was felt to be acute viral gastroenteritis.  Symptoms did not improve.  She was seen on 6/26 for follow up at which time a CT abd/pelvis was performed.  This noted mild colitis.  Stool studies were sent and patient was started on empiric flagyl.  She followed up later that week with Dr. [name] (7/2).  Due to complaint of nausea and improvement of symptoms her metronidazole was cut back to 250mg  TID.  She continued this dosing.  Last dose was on 7/15.  She reports that she is no longer having watery stool, but her stools remain loose and frequent.  She notes + fatigue, "no Energy."  An attempt was made to start the patient on oral vancomycin which was cost prohibitive.  She presents today for admission for Vancomycin in a hospitalized setting.  Today she notes 2 loose bms so far (it is 12noon).  Mild lower abdominal tenderness. Denies hematochezia.  Denies black tarry stools.  Denies associated nausea.  Tolerating PO's.      Review of Systems See HPI  Past Medical History  Diagnosis Date  . Hyperplastic colon polyp   . Hypertension   . Diabetes mellitus type II   . Osteoarthritis   . GERD (gastroesophageal reflux disease)   . Osteopenia   . Lupus     of skin see dermatologist frequently  . 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 OA (osteoarthritis of spine)     C-spine  . DVT (deep venous thrombosis)     right leg DVT 05-17-2008, to d/c coumadin 11/09  . Leg swelling     left leg 2-10, u/s showed a old clot, has a hypercoag panel, f/u by hematology  . History of cardiovascular stress  test     10/09 had CP, stress test neg, likely GI (GERD)-09/2006-had CP: stress test (-)    History   Social History  . Marital Status: Widowed    Spouse Name: N/A    Number of Children: 3  . Years of Education: N/A   Occupational History  . Retired    Social History Main Topics  . Smoking status: Never Smoker   . Smokeless tobacco: Not on file  . Alcohol Use: No  . Drug Use: No  . Sexually Active: Not Currently   Other Topics Concern  . Not on file   Social History Narrative   Single-widow 3 children , 1 in [place] (son, daughter - [place]) [place] to [place] 2002 from [place], [employer] Use - no   tobacco-- never  Illicit Drug Use - no Pt is Jehovah's Witness-No blood productsDaughter - [name]     Past Surgical History  Procedure Date  . Cholecystectomy   . Breast biopsy     left breast  . Breast lumpectomy     left breast-ductal carcinoma in situ  . Appendectomy     Family History  Problem Relation Age of Onset  . Diabetes Mother   . Diabetes    .  Heart attack Neg Hx   . Colon cancer Neg Hx   . Breast cancer Neg Hx     Allergies  Allergen Reactions  . Fluconazole Other (See Comments)    REACTION: severe fatigue and muscle weakness  . Metformin Diarrhea    REACTION: gi sxs  . Pioglitazone Other (See Comments)    REACTION: wt gain    Current Outpatient Prescriptions on File Prior to Visit  Medication Sig Dispense Refill  . Calcium Carbonate-Vitamin D (CALCIUM-VITAMIN D) 600-200 MG-UNIT CAPS Take 1 tablet by mouth 2 (two) times daily.       . Cholecalciferol (VITAMIN D3) 1000000 UNIT/GM LIQD Take 2,000 Units by mouth. 2,000 international untis (one drop) by mouth once daily at bedtime       . cholestyramine (QUESTRAN) 4 GM/DOSE powder One packet mixed in 6 oz of water twice daily to help with diarrhea.  240 g  1  . cyclobenzaprine (FLEXERIL) 5 MG tablet Take 1 tablet (5 mg total) by mouth 2 (two) times daily as needed.  30 tablet  0  . dicyclomine  (BENTYL) 10 MG capsule Take 1 capsule by mouth up to 3 times daily while taking Flagyl to help with cramping and to help slow down the bacteria.  60 capsule  0  . fish oil-omega-3 fatty acids 1000 MG capsule Take 2 g by mouth daily.        . Garlic 500 MG CAPS Take 500 mg by mouth 2 (two) times daily.        Marland Kitchen glucosamine-chondroitin 500-400 MG tablet Take 1 tablet by mouth 2 (two) times daily.        Marland Kitchen glucose blood (FREESTYLE LITE) test strip 1 each by Other route as needed. Use as instructed to check blood sugar four times daily (250.00)      . hydrochlorothiazide 25 MG tablet Take 1 tablet (25 mg total) by mouth daily.  30 tablet  0  . ibuprofen (ADVIL,MOTRIN) 800 MG tablet One tablet 3x daily as needed- Use spraringly  90 tablet  0  . insulin lispro protamine-insulin lispro (HUMALOG 75/25) (75-25) 100 UNIT/ML SUSP Inject into the skin 2 (two) times daily with a meal. Sliding scale as directed      . Insulin Pen Needle (PEN NEEDLES 29GX1/2") 29G X MISC by Does not apply route. Use three times a day for insulin injection       . Lactobacillus (ACIDOPHILUS PO) Take by mouth daily.        Marland Kitchen lisinopril (PRINIVIL,ZESTRIL) 40 MG tablet Take 1 tablet (40 mg total) by mouth daily.  90 tablet  1  . metroNIDAZOLE (FLAGYL) 500 MG tablet Take 250 mg by mouth 3 (three) times daily.        . mometasone (ELOCON) 0.1 % cream Apply topically daily.        . Multiple Vitamin (MULTIVITAMIN) tablet Take 1 tablet by mouth daily.       . niacin 500 MG tablet Take 500 mg by mouth daily with breakfast.        . Polyethylene Glycol 3350 POWD by Does not apply route once. One capful by mouth once daily(mixed with 8oz of water)       . sulfamethoxazole-trimethoprim (BACTRIM,SEPTRA) 400-80 MG per tablet Take 1 tablet by mouth 2 (two) times daily.  14 tablet  0  . vancomycin (VANCOCIN) 250 MG capsule One tab 4 times daily x 2 weeks, then 3x daily for 1 week, then 2x daily for  two weeks,  then once daily for 1 week  70  capsule  0    There were no vitals taken for this visit.       Objective:   Physical Exam  Constitutional: She appears well-developed and well-nourished. No distress.  HENT:  Head: Normocephalic and atraumatic.  Eyes: Conjunctivae are normal.  Cardiovascular: Normal rate and regular rhythm.  Exam reveals no friction rub.   No murmur heard. Pulmonary/Chest: Effort normal and breath sounds normal. No respiratory distress. She has no wheezes. She has no rales. She exhibits no tenderness.  Abdominal: Soft.       Mild lower abdominal tenderness without rebound or guarding. No palpable masses.  Skin: Skin is warm and dry. She is not diaphoretic.  Psychiatric: She has a normal mood and affect. Her speech is normal. Thought content normal.          Assessment & Plan:

## 2011-06-03 NOTE — Telephone Encounter (Addendum)
Yesterday had 5 loose stools.  Feels tired.  Unable to afford the 1300 dollar copay.  I called [facility] to see how much a compounding suspension would be and the estimate was 309$.  She tells me that she cannot afford that.  Recommended that she come in to be evaluated for admission this morning.

## 2011-06-04 LAB — COMPREHENSIVE METABOLIC PANEL
ALT: 25 U/L (ref 0–35)
AST: 22 U/L (ref 0–37)
Albumin: 2.9 g/dL — ABNORMAL LOW (ref 3.5–5.2)
Alkaline Phosphatase: 70 U/L (ref 39–117)
Calcium: 9.1 mg/dL (ref 8.4–10.5)
GFR calc Af Amer: >60 mL/min (ref >60)
Glucose, Bld: 326 mg/dL — ABNORMAL HIGH (ref 70–99)
Potassium: 4.1 mEq/L (ref 3.5–5.1)
Sodium: 134 mEq/L — ABNORMAL LOW (ref 135–145)
Total Protein: 6.4 g/dL (ref 6.0–8.3)

## 2011-06-04 LAB — DIFFERENTIAL
Basophils Absolute: 0.1 10*3/uL (ref 0.0–0.1)
Basophils Relative: 1 % (ref 0–1)
Eosinophils Absolute: 0.2 10*3/uL (ref 0.0–0.7)
Monocytes Absolute: 0.8 10*3/uL (ref 0.1–1.0)
Neutro Abs: 4.2 10*3/uL (ref 1.7–7.7)
Neutrophils Relative %: 59 % (ref 43–77)

## 2011-06-04 LAB — GLUCOSE, CAPILLARY
Glucose-Capillary: 275 mg/dL — ABNORMAL HIGH (ref 70–99)
Glucose-Capillary: 343 mg/dL — ABNORMAL HIGH (ref 70–99)

## 2011-06-04 LAB — CBC
Hemoglobin: 11 g/dL — ABNORMAL LOW (ref 12.0–15.0)
MCH: 27.3 pg (ref 26.0–34.0)
MCHC: 33 g/dL (ref 30.0–36.0)
Platelets: 180 10*3/uL (ref 150–400)

## 2011-06-04 LAB — HEMOGLOBIN A1C: Hgb A1c MFr Bld: 8.5 % — ABNORMAL HIGH (ref <5.7)

## 2011-06-04 LAB — CLOSTRIDIUM DIFFICILE BY PCR: Toxigenic C. Difficile by PCR: NEGATIVE

## 2011-06-05 LAB — CBC
MCHC: 32.6 g/dL (ref 30.0–36.0)
Platelets: 193 10*3/uL (ref 150–400)
RDW: 13.6 % (ref 11.5–15.5)
WBC: 7 10*3/uL (ref 4.0–10.5)

## 2011-06-05 LAB — BASIC METABOLIC PANEL
BUN: 11 mg/dL (ref 6–23)
Chloride: 98 mEq/L (ref 96–112)
GFR calc Af Amer: >60 mL/min (ref >60)
GFR calc non Af Amer: >60 mL/min (ref >60)
Potassium: 4.5 mEq/L (ref 3.5–5.1)
Sodium: 135 mEq/L (ref 135–145)

## 2011-06-05 LAB — GLUCOSE, CAPILLARY
Glucose-Capillary: 425 mg/dL — ABNORMAL HIGH (ref 70–99)
Glucose-Capillary: 406 mg/dL — ABNORMAL HIGH (ref 70–99)
Glucose-Capillary: 288 mg/dL — ABNORMAL HIGH (ref 70–99)

## 2011-06-06 LAB — GLUCOSE, CAPILLARY
Glucose-Capillary: 458 mg/dL — ABNORMAL HIGH (ref 70–99)
Glucose-Capillary: 391 mg/dL — ABNORMAL HIGH (ref 70–99)

## 2011-06-06 LAB — COMPREHENSIVE METABOLIC PANEL
ALT: 30 U/L (ref 0–35)
Calcium: 9.2 mg/dL (ref 8.4–10.5)
GFR calc Af Amer: >60 mL/min (ref >60)
Glucose, Bld: 535 mg/dL — ABNORMAL HIGH (ref 70–99)
Sodium: 130 mEq/L — ABNORMAL LOW (ref 135–145)
Total Protein: 7.4 g/dL (ref 6.0–8.3)

## 2011-06-06 LAB — CBC
HCT: 36.2 % (ref 36.0–46.0)
Hemoglobin: 12.3 g/dL (ref 12.0–15.0)
MCHC: 34 g/dL (ref 30.0–36.0)
Platelets: 212 10*3/uL (ref 150–400)
RBC: 4.48 MIL/uL (ref 3.87–5.11)
RDW: 13.4 % (ref 11.5–15.5)
RDW: 13.3 % (ref 11.5–15.5)
WBC: 10.1 10*3/uL (ref 4.0–10.5)
WBC: 12.1 10*3/uL — ABNORMAL HIGH (ref 4.0–10.5)

## 2011-06-06 LAB — DIFFERENTIAL
Basophils Absolute: 0 10*3/uL (ref 0.0–0.1)
Basophils Relative: 0 % (ref 0–1)
Eosinophils Absolute: 0 10*3/uL (ref 0.0–0.7)
Eosinophils Relative: 0 % (ref 0–5)
Neutrophils Relative %: 90 % — ABNORMAL HIGH (ref 43–77)

## 2011-06-06 LAB — BASIC METABOLIC PANEL
Chloride: 99 mEq/L (ref 96–112)
GFR calc Af Amer: >60 mL/min (ref >60)
GFR calc non Af Amer: >60 mL/min (ref >60)
Glucose, Bld: 330 mg/dL — ABNORMAL HIGH (ref 70–99)
Potassium: 4.2 mEq/L (ref 3.5–5.1)
Sodium: 135 mEq/L (ref 135–145)

## 2011-06-07 LAB — GLUCOSE, CAPILLARY
Glucose-Capillary: 195 mg/dL — ABNORMAL HIGH (ref 70–99)
Glucose-Capillary: 203 mg/dL — ABNORMAL HIGH (ref 70–99)

## 2011-06-08 LAB — GLUCOSE, CAPILLARY
Glucose-Capillary: 222 mg/dL — ABNORMAL HIGH (ref 70–99)
Glucose-Capillary: 224 mg/dL — ABNORMAL HIGH (ref 70–99)

## 2011-06-09 LAB — GLUCOSE, CAPILLARY
Glucose-Capillary: 106 mg/dL — ABNORMAL HIGH (ref 70–99)
Glucose-Capillary: 116 mg/dL — ABNORMAL HIGH (ref 70–99)

## 2011-06-09 LAB — BASIC METABOLIC PANEL
Chloride: 106 mEq/L (ref 96–112)
GFR calc Af Amer: >60 mL/min (ref >60)
Potassium: 3 mEq/L — ABNORMAL LOW (ref 3.5–5.1)

## 2011-06-11 ENCOUNTER — Telehealth: Payer: Self-pay | Admitting: *Deleted

## 2011-06-11 NOTE — Telephone Encounter (Signed)
Received call from Ilene at [facility] stating pt was discharged from the hospital and her BP medications have been changed. She now takes Norvasc 10mg  1 tablet daily. Benicar 40mg  1 tablet daily and Furosemide 20mg  1 tablet daily. She states that pt was not able to get the Benicar filled due to the cost and is wanting to know if she needs an alternative. Nurse reports that pt's BP this afternoon was 142/70. Please call pt with medication change. Please advise.

## 2011-06-11 NOTE — Telephone Encounter (Signed)
Please provide benicar 40mg  samples until pt can be seen for follow up.

## 2011-06-12 NOTE — Telephone Encounter (Signed)
Notified pt. Sample left at front desk for pick up. Benicar 40mg   LOT [id number]  Exp 08/2013.

## 2011-06-13 NOTE — Discharge Summary (Signed)
NAMELILLEE, [name] NO.:  [id number]  MEDICAL RECORD NO.:  [id number]  LOCATION:  4502                         FACILITY:  [facility]  PHYSICIAN:  Isidor Holts, M.D.  DATE OF BIRTH:  1940-11-25  DATE OF ADMISSION:  06/03/2011 DATE OF DISCHARGE:  06/05/2011                              DISCHARGE SUMMARY   PRIMARY CARE PROVIDER:  Sandford Craze, NP  PRIMARY CARE PROVIDER:  Barbette Hair. Artist Pais, DO  DISCHARGE DIAGNOSES: 1. Recent Clostridium difficile colitis, now resolved. 2. Uncontrolled insulin-requiring type 2 diabetes mellitus. 3. Hypertension. 4. Gastroesophageal reflux disease. 5. Obstructive sleep apnea syndrome. 6. Previous history of deep venous thrombosis/superficial     thrombophlebitis. 7. History of breast cancer status post lumpectomy/radiation therapy     remotely. 8. Cutaneous lupus erythematosus. 9. Osteoarthritis/osteopenia.  DISCHARGE MEDICATIONS: 1. Vancomycin oral solution (50 mg/mL) 125 mg p.o. t.i.d. for 8 days     from June 06, 2011. 2. Acidophilus probiotic OTC 1 capsule p.o. t.i.d. 3. Fentanyl 10 mg p.o. t.i.d. 4. Flexeril 5 mg p.o. p.r.n. b.i.d. for pain. 5. Furosemide 20 mg p.o. daily. 6. Kerlix 5/500 mg p.o. b.i.d. 7. Glucosamine chondroitin 1 tablet p.o. b.i.d 8. Humalog mix (75/25) b.i.d. per preadmission sliding scale regimen. 9. Lisinopril 40 mg p.o. daily. 10.Multivitamin 1 p.o. daily. 11.Niacin 500 mg p.o. q.a.m. 12.Omega-3 acid ethyl esters 1 g p.o. daily. 13.Vitamin D3 oral liquid 2000 units sublingually at bedtime.  Note,     MiraLax has been discontinued.  PROCEDURES:  None.  CONSULTATIONS:  Hedwig Morton. Juanda Chance, MD, Gastroenterologist.  ADMISSION HISTORY:  As in H and P notes of June 03, 2011, dictated by Dr. Fran Lowes.  However, in brief, this is a 70 year old female, with known history of C. difficile colitis diagnosed in June 2012, treated with Flagyl, now status post approximately 2-3 weeks of treatment  with significant reduction in stool frequency from over 20 loose stools per day down to a 5-6 loose stools per day.  The patient presents because of continued symptoms and was admitted for further evaluation, investigation, and management.  CLINICAL COURSE: 1. C. difficile colitis.  For details of presentation, refer to     admission history above.  The patient was managed with bowel rest,     intravenous fluid hydration, and oral vancomycin.  Stool sample     sent for C. difficile toxin was fortunately negative, indicative of     resolution of C. difficile colitis.  Per Dr. Verlee Monte Brodie's     recommendations, the patient is to complete further 10 days of     vancomycin treatment.  She continues on probiotics.  During the     course of her hospitalization, the patient had formed stools only     and there was no vomiting or abdominal pain.  2. Type 2 diabetes mellitus.  This is insulin requiring.  The patient     preadmission, was on a regimen of 28-62 units of NovoLog mix (75/25)     per sliding scale as established by her primary endocrinologist, Dr Allena Katz.  She     did have significant hyperglycemia during this hospitalization,     possibly associated with the fact that  she was recommenced on     NovoLog mix, but at an initial dose of 30 units subcutaneously     twice daily in addition to sliding scale insulin coverage with     NovoLog.  As a matter of fact, her CBG on June 05, 2011, was 423.     The patient has been recommenced on 60 units of NovoLog mix (75/25)     b.i.d., but we shall defer to her primary care provider for     further adjustments.  She is at the moment, recommended to     continue to adhere to her home insulin regimen, until reevaluated by     her primary MD.  3. Hypertension.  The patient was managed with preadmission     antihypertensive medications.  BP remained reasonable.  4. Cutaneous SLE.  This did not prove troublesome.  5. Gastroesophageal reflux  disease.  The patient was asymptomatic from     this viewpoint.  DISPOSITION:  The patient was on June 05, 2011, asymptomatic.  There were no new issues.  She was considered clinically stable for discharge and discharged accordingly.  ACTIVITY:  As tolerated.  Recommended to increase activity slowly.  DIET:  Heart healthy/carbohydrate modified.  FOLLOWUP INSTRUCTIONS:  The patient is to follow up with her primary care provider, [name], [place] and Dr. Olegario [name] office in the coming week. She has been instructed to call for an appointment and verbalized understanding.     Isidor Holts, M.D.     CO/MEDQ  D:  06/05/2011  T:  06/05/2011  Job:  [id number]  cc:   Sandford Craze, NP Barbette Hair. Artist Pais, DO  Electronically Signed by Isidor Holts M.D. on 06/13/2011 03:06:54 PM

## 2011-06-13 NOTE — Discharge Summary (Signed)
[name], [name] NO.:  [id number]  MEDICAL RECORD NO.:  [id number]  LOCATION:  4502                         FACILITY:  [facility]  PHYSICIAN:  Isidor Holts, M.D.  DATE OF BIRTH:  [birth year]  DATE OF ADMISSION:  06/03/2011 DATE OF DISCHARGE:  06/09/2011                              DISCHARGE SUMMARY   ADDENDUM  PRIMARY CARE PROVIDER:  Sandford Craze, NP.  PRIMARY MEDICAL DOCTOR:  Barbette Hair. Artist Pais, DO  UPDATED DISCHARGE DIAGNOSES: 1. Recent Clostridium difficile colitis, now resolved. 2. Uncontrolled insulin-requiring type 2 diabetes mellitus. 3. Hypertension. 4. Gastroesophageal reflux disease. 5. Obstructive sleep apnea syndrome. 6. Previous history of deep vein thrombosis/superficial     thrombophlebitis. 7. Remote history of breast cancer, status post lumpectomy/radiation     therapy remotely. 8. Cutaneous lupus erythematosus. 9. Osteoarthritis/osteopenia. 10.Generalized allergic reaction, secondary to angiotensin-converting     enzyme inhibitor/oral vancomycin.  UPDATED DISCHARGE MEDICATION LIST: 1. Amlodipine 10 mg p.o. daily. 2. NovoLog insulin per sliding scale as follows:  CBG 70-120, no     insulin.  CBG 121-150, two units.  CBG 151-200, three units.  CBG     201-250, five units.  CBG 251-300, eight units.  CBG 301-350,     eleven units.  CBG 351-400, fifteen units. 3. Lantus insulin 40 units subcutaneously q.12 hourly via pen. 4. Benicar 40 mg p.o. daily. 5. Acidophilus probiotic OTC 1 capsule p.o. t.i.d. 6. Bentyl 10 mg p.o. t.i.d. 7. Flexeril 5 mg p.o. p.r.n. b.i.d. for pain. 8. Furosemide 20 mg p.o. daily. 9. Garlic extract 811 mg p.o. b.i.d. 10.Glucosamine/chondroitin OTC 1 tablet p.o. b.i.d. 11.Multivitamins therapeutic 1 tablet p.o. daily. 12.Niacin 500 mg p.o. q.a.m. 13.Omega-3 acid ethyl esters 1 g p.o. daily. 14.Vitamin D oral liquid 2000 units sublingually nightly.  Note: The following medications have been  discontinued: 1. Lisinopril. 2. Humalog Mix (75/25). 3. MiraLax.  For details of consultations, admission history, and detailed clinical course, refer to interim discharge summary dictated on June 05, 2011. However, in addition, the followings are pertinent:   The patient was actually discharged due to clinical stability on June 05, 2011, but on the same day she abruptly developed a generalized erythematous skin appearance with associated pruritus and peripheral cyanosis, affecting the palms and soles of the feet.  Review of the patient's medications revealed no immediate culprits.  She had been on ACE inhibitor treatment for a long time, without any adverse effects and oral vancomycin was felt unlikely to be the culprit, in contradistinction to IV vancomycin.  The patient was treated with parenteral steroids, H2RA, and antihistaminics, with satisfactory clinical response. Attempts were made to reinstate her preadmission medications in toto, however, the reaction reccurred 24 hours later, therefore the patient's vancomycin and lisinopril have been completely discontinued.  I did discuss the implications of this with Dr.Dora Juanda Chance and we had consensus  that the patient at this time, effectively has resolved her C. difficile colitis.  Benicar was substituted for BP control, without any deleterious effects.  Following all this, the patient's glycemia continued to remain uncontrolled, ranging in the high 300s to the 400s and was finally brought under control with a combination of scheduled Lantus insulin and  sliding scale insulin coverage.  As of a.m. of June 09, 2011, CBG was 116.  The patient will be discharged on this combination and will continue to follow up with her primary care provider as well as her endocrinologist.  DISPOSITION:  The patient as of June 09, 2011, was considered clinically stable for discharge.  There were no new issues.  She was therefore discharged  accordingly.  ACTIVITY:  As tolerated.  Recommended to increase activity slowly.  DIET:  Heart healthy/carbohydrate modified.  FOLLOWUP INSTRUCTIONS:  The patient is to follow up with her primary care provider, [name], [place] and Dr. Artist Pais in the coming week.  She is instructed to call for an appointment and has verbalized understanding. In addition, she is to follow up with her primary endocrinologist, Dr. Allena Katz.  SPECIAL INSTRUCTIONS:  Home health RN for diabetic treatment has been arranged as has outpatient diabetic classes.     Isidor Holts, M.D.     CO/MEDQ  D:  06/09/2011  T:  06/10/2011  Job:  [id number]  cc:   Sandford Craze, NP Barbette Hair. Artist Pais, DO Dr. Allena Katz  Electronically Signed by Isidor Holts M.D. on 06/13/2011 03:11:14 PM

## 2011-06-16 ENCOUNTER — Encounter: Payer: Self-pay | Admitting: Family

## 2011-06-16 ENCOUNTER — Ambulatory Visit (INDEPENDENT_AMBULATORY_CARE_PROVIDER_SITE_OTHER): Payer: Medicare Other | Admitting: Family

## 2011-06-16 DIAGNOSIS — I10 Essential (primary) hypertension: Secondary | ICD-10-CM

## 2011-06-16 DIAGNOSIS — R609 Edema, unspecified: Secondary | ICD-10-CM

## 2011-06-16 DIAGNOSIS — A0472 Enterocolitis due to Clostridium difficile, not specified as recurrent: Secondary | ICD-10-CM

## 2011-06-16 NOTE — Assessment & Plan Note (Signed)
Stable with current changes.  Lantus samples provided to pt today.  Pt to follow up with Dr. Allena Katz.

## 2011-06-16 NOTE — Assessment & Plan Note (Signed)
ACE was unlikely the culprit in her drug reaction in the hospital.  I suspect vancomyin was cause.  She cannot afford Benicar and will really benefit from the renal protection that an ACE would give her since she is diabetic.  Will plan to carefully reintroduce ACE in place of benicar.  Pt is agreeable to this plan.

## 2011-06-16 NOTE — Assessment & Plan Note (Signed)
Resolved

## 2011-06-16 NOTE — Assessment & Plan Note (Signed)
Improved.  Will add back furosemide on a PRN basis.

## 2011-06-16 NOTE — Patient Instructions (Signed)
Stop Benicar, restart lisinopril.  Keep your appointment with Dr. Allena Katz. Follow up in 3months, sooner if problems or concerns.

## 2011-06-16 NOTE — Progress Notes (Signed)
Subjective:    Patient ID: [name], female    DOB: [birth year], 70 y.o.   MRN: [id number]  HPI Ms. [name] is a 70 yr old female who presents today for hospital follow up.  She was hospitalized at [facility] 7/17-7/23 for C. Diff colitis.  She was treated with vancomycin during the hospitalization.  Follow up C. Diff testing was negative.  She tells me that her diarrhea has completely resolved.  She does note that she continues to feel some generalized muscle weakness.    Review of discharge summary noted that the patient had a drug reaction during her hospitalization. Felt like she was on "fire- like a bad sunburn." It was unclear what the culprit was, however the only new medication was vancomycin.  Vancomycin was discontinued.  Her ACE was substituted with and ARB. Pt tells me that she cannot afford the benicar.  She had been on lisinopril "for years" without problems.  Felt like she was on "fire- like a bad sunburn."  Diabetes-  Her sugars ran high during her hospitalization and her insulin was changed.  She is currently on: Novolog sliding scale TID, lantus 40 units BID.  Sugar was 135 this AM.  Reports overall sugars have been stable.  She sees Dr. Allena [facility] at [facility].  She has follow up on August 18th.    Edema- notes "slight swelling in feet."  Not taking any diuretics since discharge.  Review of Systems See hpi  Past Medical History  Diagnosis Date  . Hyperplastic colon polyp   . Hypertension   . Diabetes mellitus type II   . Osteoarthritis   . GERD (gastroesophageal reflux disease)   . Osteopenia   . Lupus     of skin see dermatologist frequently  . 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 OA (osteoarthritis of spine)     C-spine  . DVT (deep venous thrombosis)     right leg DVT 05-17-2008, to d/c coumadin 11/09  . Leg swelling     left leg 2-10, u/s showed a old clot, has a hypercoag panel, f/u  by hematology  . History of cardiovascular stress test     10/09 had CP, stress test neg, likely GI (GERD)-09/2006-had CP: stress test (-)    History   Social History  . Marital Status: Widowed    Spouse Name: N/A    Number of Children: 3  . Years of Education: N/A   Occupational History  . Retired    Social History Main Topics  . Smoking status: Never Smoker   . Smokeless tobacco: Not on file  . Alcohol Use: No  . Drug Use: No  . Sexually Active: Not Currently   Other Topics Concern  . Not on file   Social History Narrative   Single-widow 3 children , 1 in [place] (son, daughter - [employer]) [place] to [place] 2002 from [place], [employer] Use - no   tobacco-- never  Illicit Drug Use - no Pt is Jehovah's Witness-No blood productsDaughter - [name]     Past Surgical History  Procedure Date  . Cholecystectomy   . Breast biopsy     left breast  . Breast lumpectomy     left breast-ductal carcinoma in situ  . Appendectomy     Family History  Problem Relation Age of Onset  . Diabetes Mother   . Diabetes    .  Heart attack Neg Hx   . Colon cancer Neg Hx   . Breast cancer Neg Hx     Allergies  Allergen Reactions  . Fluconazole Other (See Comments)    REACTION: severe fatigue and muscle weakness  . Metformin Diarrhea    REACTION: gi sxs  . Pioglitazone Other (See Comments)    REACTION: wt gain    Current Outpatient Prescriptions on File Prior to Visit  Medication Sig Dispense Refill  . Calcium Carbonate-Vitamin D (CALCIUM-VITAMIN D) 600-200 MG-UNIT CAPS Take 1 tablet by mouth 2 (two) times daily.       . Cholecalciferol (VITAMIN D3) 1000000 UNIT/GM LIQD Take 2,000 Units by mouth. 2,000 international untis (one drop) by mouth once daily at bedtime       . cyclobenzaprine (FLEXERIL) 5 MG tablet Take 1 tablet (5 mg total) by mouth 2 (two) times daily as needed.  30 tablet  0  . fish oil-omega-3 fatty acids 1000 MG capsule Take 2 g by mouth daily.        . Garlic  500 MG CAPS Take 500 mg by mouth 2 (two) times daily.        Marland Kitchen glucosamine-chondroitin 500-400 MG tablet Take 1 tablet by mouth 2 (two) times daily.        Marland Kitchen glucose blood (FREESTYLE LITE) test strip 1 each by Other route as needed. Use as instructed to check blood sugar four times daily (250.00)      . Insulin Pen Needle (PEN NEEDLES 29GX1/2") 29G X MISC by Does not apply route. Use three times a day for insulin injection       . Lactobacillus (ACIDOPHILUS PO) Take by mouth daily.        Marland Kitchen lisinopril (PRINIVIL,ZESTRIL) 40 MG tablet Take 1 tablet (40 mg total) by mouth daily.  90 tablet  1  . Multiple Vitamin (MULTIVITAMIN) tablet Take 1 tablet by mouth daily.       . niacin 500 MG tablet Take 500 mg by mouth daily with breakfast.          BP 116/66  Pulse 84  Temp(Src) 97.8 F (36.6 C) (Oral)  Resp 16  Ht 5\' 7"  (1.702 m)  Wt 204 lb (92.534 kg)  BMI 31.95 kg/m2       Objective:   Physical Exam  Constitutional: She appears well-developed and well-nourished.  Cardiovascular: Normal rate and regular rhythm.   Pulmonary/Chest: Effort normal and breath sounds normal.  Musculoskeletal:       1+ bilateral lower extremity edema.   Skin:       No rashes noted.   Psychiatric: She has a normal mood and affect. Her speech is normal.          Assessment & Plan:   No problem-specific assessment & plan notes found for this encounter. lantus samples- 3 vials provided [id number] exp 04 2013

## 2011-06-23 ENCOUNTER — Encounter: Payer: Self-pay | Admitting: Family

## 2011-06-23 DIAGNOSIS — D0359 Melanoma in situ of other part of trunk: Secondary | ICD-10-CM

## 2011-06-23 HISTORY — DX: Melanoma in situ of other part of trunk: D03.59

## 2011-06-24 ENCOUNTER — Telehealth: Payer: Self-pay | Admitting: Family

## 2011-06-24 ENCOUNTER — Ambulatory Visit (INDEPENDENT_AMBULATORY_CARE_PROVIDER_SITE_OTHER): Payer: Medicare Other | Admitting: Family

## 2011-06-24 ENCOUNTER — Encounter: Payer: Self-pay | Admitting: Family

## 2011-06-24 ENCOUNTER — Telehealth: Payer: Self-pay | Admitting: *Deleted

## 2011-06-24 VITALS — BP 126/64 | HR 84 | Temp 97.8°F | Resp 16 | Wt 203.0 lb

## 2011-06-24 DIAGNOSIS — R197 Diarrhea, unspecified: Secondary | ICD-10-CM

## 2011-06-24 MED ORDER — METRONIDAZOLE 500 MG PO TABS: 250.0000 mg | ORAL_TABLET | Freq: Three times a day (TID) | ORAL | Status: DC

## 2011-06-24 NOTE — Progress Notes (Signed)
Subjective:    Patient ID: [name], female    DOB: 04-15-1941, 70 y.o.   MRN: [id number]  HPI  [name] is a 70 yr old female who presents today with chief complaint of diarrhea.  Diarrhea started 24 hours ago. She had 3 BM's yesterday and 4 BM's today.  Denies fever, nausea.  + Lower abdominal tenderness.  Cramping.  Tolerating po's without difficulty.     Review of Systems See HPI  Past Medical History  Diagnosis Date  . Hyperplastic colon polyp   . Hypertension   . Diabetes mellitus type II   . Osteoarthritis   . GERD (gastroesophageal reflux disease)   . Osteopenia   . Lupus     of skin see dermatologist frequently  . 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 OA (osteoarthritis of spine)     C-spine  . DVT (deep venous thrombosis)     right leg DVT 05-17-2008, to d/c coumadin 11/09  . Leg swelling     left leg 2-10, u/s showed a old clot, has a hypercoag panel, f/u by hematology  . History of cardiovascular stress test     10/09 had CP, stress test neg, likely GI (GERD)-09/2006-had CP: stress test (-)  . 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 Marital Status: Widowed    Spouse Name: N/A    Number of Children: 3  . Years of Education: N/A   Occupational History  . Retired    Social History Main Topics  . Smoking status: Never Smoker   . Smokeless tobacco: Never Used  . Alcohol Use: No  . Drug Use: No  . Sexually Active: Not Currently   Other Topics Concern  . Not on file   Social History Narrative   Single-widow 3 children , 1 in [place] (son, daughter - [place]) [place] to [place] 2002 from [place], [employer] Use - no   tobacco-- never  Illicit Drug Use - no Pt is Jehovah's Witness-No blood productsDaughter - [name]     Past Surgical History  Procedure Date  . Cholecystectomy   . Breast biopsy     left breast  . Breast lumpectomy    left breast-ductal carcinoma in situ  . Appendectomy   . Skin cancer excision     from back     Family History  Problem Relation Age of Onset  . Diabetes Mother   . Diabetes    . Heart attack Neg Hx   . Colon cancer Neg Hx   . Breast cancer Neg Hx     Allergies  Allergen Reactions  . Fluconazole Other (See Comments)    REACTION: severe fatigue and muscle weakness  . Metformin Diarrhea    REACTION: gi sxs  . Pioglitazone Other (See Comments)    REACTION: wt gain  . Vancomycin     Erythematous rash, hands and toes became cyanotic.      Current Outpatient Prescriptions on File Prior to Visit  Medication Sig Dispense Refill  . amLODipine (NORVASC) 10 MG tablet Take 10 mg by mouth.        . Calcium Carbonate-Vitamin D (CALCIUM-VITAMIN D) 600-200 MG-UNIT CAPS Take 1 tablet by mouth 2 (two) times daily.       . Cholecalciferol (VITAMIN D3) 1000000 UNIT/GM LIQD Take 2,000 Units by mouth. 2,000  international untis (one drop) by mouth once daily at bedtime       . cyclobenzaprine (FLEXERIL) 5 MG tablet Take 1 tablet (5 mg total) by mouth 2 (two) times daily as needed.  30 tablet  0  . fish oil-omega-3 fatty acids 1000 MG capsule Take by mouth daily.       . furosemide (LASIX) 20 MG tablet Take 20 mg by mouth daily. Only as needed for swelling.      . Garlic 500 MG CAPS Take 500 mg by mouth 2 (two) times daily.        Marland Kitchen glucosamine-chondroitin 500-400 MG tablet Take 1 tablet by mouth 2 (two) times daily.        Marland Kitchen glucose blood (FREESTYLE LITE) test strip 1 each by Other route as needed. Use as instructed to check blood sugar four times daily (250.00)      . insulin aspart (NOVOLOG) 100 UNIT/ML injection Inject into the skin. Take 5-11 units on a sliding scale 3 times a day      . insulin glargine (LANTUS) 100 UNIT/ML injection Inject 42 Units into the skin 2 (two) times daily.       . Insulin Pen Needle (PEN NEEDLES 29GX1/2") 29G X MISC by Does not apply route. Use three times a day  for insulin injection       . Lactobacillus (ACIDOPHILUS PO) Take by mouth daily.        Marland Kitchen lisinopril (PRINIVIL,ZESTRIL) 40 MG tablet Take 1 tablet (40 mg total) by mouth daily.  90 tablet  1  . niacin 500 MG tablet Take 500 mg by mouth daily with breakfast.          BP 126/64  Pulse 84  Temp(Src) 97.8 F (36.6 C) (Oral)  Resp 16  Wt 203 lb 0.6 oz (92.098 kg)       Objective:   Physical Exam  Constitutional: She appears well-developed and well-nourished.  HENT:  Head: Normocephalic and atraumatic.  Cardiovascular: Normal rate and regular rhythm.   Pulmonary/Chest: Effort normal and breath sounds normal.  Abdominal: Soft. Bowel sounds are normal.       Mild left lower quadrant tenderness to palpation without guarding or rebound tenderness          Assessment & Plan:

## 2011-06-24 NOTE — Telephone Encounter (Signed)
Pt called today and reports + diarrhea x 24 hours and recurrent diarrhea.  I have requested that she come to the office at 3:15 today to be seen.  Pt verbalizes understanding.

## 2011-06-24 NOTE — Telephone Encounter (Signed)
Received a call from Dr. [name] in [facility] that patient is in her office with increasing diarrhea. Wants her to be seen by Dr. Juanda Chance.Scheduled patient to see Dr. Juanda Chance on 06/25/11 at 9:30 AM.

## 2011-06-24 NOTE — Patient Instructions (Signed)
Please see Dr. Juanda Chance tomorrow at 9:30 AM. Collect and return stool samples as soon as possible. Start Flagyl tonight. Please call if you develop fever over 101, or worsening abdominal pain.

## 2011-06-25 ENCOUNTER — Encounter: Payer: Self-pay | Admitting: Internal Medicine

## 2011-06-25 ENCOUNTER — Other Ambulatory Visit: Payer: Self-pay | Admitting: Family

## 2011-06-25 ENCOUNTER — Telehealth: Payer: Self-pay | Admitting: Family

## 2011-06-25 ENCOUNTER — Ambulatory Visit (INDEPENDENT_AMBULATORY_CARE_PROVIDER_SITE_OTHER): Payer: Medicare Other | Admitting: Internal Medicine

## 2011-06-25 VITALS — BP 132/64 | HR 88 | Ht 65.0 in | Wt 203.0 lb

## 2011-06-25 DIAGNOSIS — R197 Diarrhea, unspecified: Secondary | ICD-10-CM

## 2011-06-25 DIAGNOSIS — A0472 Enterocolitis due to Clostridium difficile, not specified as recurrent: Secondary | ICD-10-CM

## 2011-06-25 LAB — CBC WITH DIFFERENTIAL/PLATELET
Basophils Relative: 0 % (ref 0–1)
Hemoglobin: 11.5 g/dL — ABNORMAL LOW (ref 12.0–15.0)
Lymphs Abs: 2 10*3/uL (ref 0.7–4.0)
MCHC: 33.8 g/dL (ref 30.0–36.0)
Monocytes Relative: 9 % (ref 3–12)
Neutro Abs: 4 10*3/uL (ref 1.7–7.7)
Neutrophils Relative %: 59 % (ref 43–77)
RBC: 4.03 MIL/uL (ref 3.87–5.11)

## 2011-06-25 LAB — BASIC METABOLIC PANEL
Glucose, Bld: 342 mg/dL — ABNORMAL HIGH (ref 70–99)
Potassium: 4.5 mEq/L (ref 3.5–5.3)
Sodium: 131 mEq/L — ABNORMAL LOW (ref 135–145)

## 2011-06-25 MED ORDER — METRONIDAZOLE 500 MG PO TABS: 250.0000 mg | ORAL_TABLET | Freq: Three times a day (TID) | ORAL | Status: AC

## 2011-06-25 NOTE — Progress Notes (Signed)
[name] 06-18-1941 MRN [id number]     History of Present Illness:  This is a 70 year old white female with recent pseudomembranous colitis requiring hospitalization. She was unable to tolerate the vancomycin, and she was nauseated from Flagyl 500 mg tablets. She ended up taking Flagyl 250 mg 3 times a day. After finishing Flagyl, she was well for 2 weeks but then had a recurrence of diarrhea 3 days ago. She had crampy abdominal pain but no rectal bleeding. She will be due for a colonoscopy in 2014. Her last exam in March 2007 showed a hyperplastic polyp. A prior colonoscopy in 2006 showed a tubular adenoma. She has collected stool specimens for C. difficile toxin. She has a prior history of DVT, discoid lupus and breast cancer in 2004. She is status post cholecystectomy.   Past Medical History  Diagnosis Date  . Hyperplastic colon polyp   . Hypertension   . Diabetes mellitus type II   . Osteoarthritis   . GERD (gastroesophageal reflux disease)   . Osteopenia   . Lupus     of skin see dermatologist frequently  . 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 OA (osteoarthritis of spine)     C-spine  . DVT (deep venous thrombosis)     right leg DVT 05-17-2008, to d/c coumadin 11/09  . Leg swelling     left leg 2-10, u/s showed a old clot, has a hypercoag panel, f/u by hematology  . History of cardiovascular stress test     10/09 had CP, stress test neg, likely GI (GERD)-09/2006-had CP: stress test (-)  . 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 Cholecystectomy   . Breast biopsy     left breast  . Breast lumpectomy     left breast-ductal carcinoma in situ  . Appendectomy   . Skin cancer excision     from back     reports that she has never smoked. She has never used smokeless tobacco. She reports that she does not drink alcohol or use illicit drugs. family history  includes Diabetes in her mother and unspecified family member.  There is no history of Heart attack, and Colon cancer, and Breast cancer, . Allergies  Allergen Reactions  . Fluconazole Other (See Comments)    REACTION: severe fatigue and muscle weakness  . Metformin Diarrhea    REACTION: gi sxs  . Pioglitazone Other (See Comments)    REACTION: wt gain  . Vancomycin     Erythematous rash, hands and toes became cyanotic.          Review of Systems:  The remainder of the 10  point ROS is negative except as outlined in H&P   Physical Exam: General appearance  Well developed in no distress. Eyes- non icteric. HEENT nontraumatic, normocephalic. Mouth no lesions, tongue papillated, no cheilosis. Neck supple without adenopathy, thyroid not enlarged, no carotid bruits, no JVD. Lungs Clear to auscultation bilaterally. Cor normal S1 normal S2, regular rhythm , no murmur,  quiet precordium. Abdomen soft abdomen nontender,with discomfort in left lower quadrant. No distention. Normal active bowel sounds. Rectal: Not done. Extremities no pedal edema. Skin no lesions. Neurological alert and oriented x 3. Psychological normal mood and affect.  Assessment and Plan:  Problem #1 Recurrent diarrhea after successful treatment of pseudomembranous colitis. 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  The stool specimen is pending. She was instructed on a low-residue diet and Flagyl 250 mg 3 times a day for 10 days then 250 mg twice a day for 10 days then 250 mg once a day for 10 days. If diarrhea recurs, I suggest colonoscopy to rule out microscopic or collagenous colitis.   06/25/2011 [name]

## 2011-06-25 NOTE — Telephone Encounter (Signed)
[facility] sent to [facility] per 06/24/11 order of flagyl and cancelled rx that went to [facility]. Left detailed message on pt's machine that rx has been sent to [facility] and to call if any questions.

## 2011-06-25 NOTE — Assessment & Plan Note (Signed)
70 yr old female recently admitted for diarrhea/c. Diff colitis.  C. Diff was neg in hospital and diarrhea resolved x 2 weeks.  Now with recurrent diarrhea and LLQ tenderness.  She unfortunately had a reaction to vancomycin while hospitalized.  Will have pt repeat stool studies including c. Diff PCR, start metronidazole.  Apt scheduled for 9:30 AM 8/8 with gastroenterology.

## 2011-06-25 NOTE — Telephone Encounter (Signed)
FLAGYL RX NEEDS TO GO TO [facility] [address]

## 2011-06-25 NOTE — Patient Instructions (Addendum)
Please take Flagyl 500 mg- 1/2 tablet by mouth three times daily x 10 days. Then, take 1/2 tablet by mouth twice daily x 10 days. Then, take 1/2 tablet by mouth once daily x 10 days. Dr Sandford Craze

## 2011-06-26 ENCOUNTER — Other Ambulatory Visit: Payer: Self-pay | Admitting: Internal Medicine

## 2011-06-26 ENCOUNTER — Telehealth: Payer: Self-pay | Admitting: Family

## 2011-06-26 LAB — CLOSTRIDIUM DIFFICILE BY PCR: Toxigenic C. Difficile by PCR: NOT DETECTED

## 2011-06-26 NOTE — Telephone Encounter (Signed)
Please call pt and let her know that her sugar was >350 when we drew her blood.  I would like her to increase her lantus to 42 units twice daily and keep her follow up appointment with Dr. Allena Katz on 8/18. Also, could you pls call lab and see if they can add on iron, ferritin, tibc, b12, folate.  Diagnosis anemia.

## 2011-06-26 NOTE — Telephone Encounter (Signed)
Please call pt and let her know that I would like her to continue current insulin 40 of lantus bid and sliding scale as is.  Call if sugar <80 or >300.

## 2011-06-26 NOTE — Telephone Encounter (Signed)
Notified pt and she states that she is currently taking Lantus 40 units twice a day. She takes Novolog 5-11 units 3 times daily on a sliding scale. Pt reports low FBS readings: 06/26/11 64,  06/25/11 61 and 06/24/11 96.  Reports that her BS was 199 1 hour after breakfast and 191 before lunch. Pt is concerned about increasing her dose of Lantus. Please advise.

## 2011-06-26 NOTE — Telephone Encounter (Signed)
Attempted to reach pt twice. Line was picked up and disconnected both times without a response. Will try again later.

## 2011-06-27 ENCOUNTER — Telehealth: Payer: Self-pay | Admitting: Family

## 2011-06-27 NOTE — Telephone Encounter (Signed)
Rx refill sent to pharmacy. 

## 2011-06-27 NOTE — Telephone Encounter (Signed)
Stool neg for c. Diff.  Reviewed with Dr. Juanda Chance.  She would like for patient to continue flagyl.  Called pt reviewed results and plan to continue flagyl as directed by Dr. Juanda Chance. She verbalizes understanding.

## 2011-06-27 NOTE — Telephone Encounter (Signed)
Call placed to patient [phone], she was informed per Sandford Craze instruction and has verbalized understanding.

## 2011-06-28 ENCOUNTER — Other Ambulatory Visit: Payer: Self-pay | Admitting: Family

## 2011-06-29 ENCOUNTER — Encounter: Payer: Self-pay | Admitting: Family

## 2011-06-29 LAB — STOOL CULTURE

## 2011-06-30 ENCOUNTER — Ambulatory Visit: Payer: Medicare Other | Admitting: Family

## 2011-06-30 ENCOUNTER — Other Ambulatory Visit: Payer: Self-pay | Admitting: Family

## 2011-06-30 ENCOUNTER — Ambulatory Visit: Payer: Medicare Other | Admitting: Internal Medicine

## 2011-06-30 NOTE — Telephone Encounter (Signed)
Please advise refill? 

## 2011-06-30 NOTE — Telephone Encounter (Signed)
OK to give 30 day supply.

## 2011-07-01 ENCOUNTER — Telehealth: Payer: Self-pay

## 2011-07-01 NOTE — Telephone Encounter (Signed)
Pt left a message stating that she believes she has a urine infection? Pt states every time she takes flagyl she gets this? Pt would like something called into [facility]. Pt states she thought she took Bactrim before for this and it worked well? Please advise?

## 2011-07-01 NOTE — Telephone Encounter (Signed)
Left a detailed message on pts home phone.

## 2011-07-01 NOTE — Telephone Encounter (Signed)
Needs visit please.  I will need her to complete a urine culture so that we know exactly what we are treating.Marland Kitchen

## 2011-07-04 ENCOUNTER — Telehealth: Payer: Self-pay | Admitting: Family

## 2011-07-04 NOTE — Telephone Encounter (Signed)
PC to pt.  She is feeling better.  She denies any frequency, burning, pain or fever.  Advised no new plans at this time, but if she begins to have any symptoms she should call the office for an appointment.  She is agreeable with this plan.

## 2011-07-04 NOTE — Telephone Encounter (Signed)
Please call pt and ask her how her urinary symptoms are doing.  If still symptomatic, she will need OV.

## 2011-07-15 ENCOUNTER — Other Ambulatory Visit: Payer: Self-pay | Admitting: Internal Medicine

## 2011-07-15 DIAGNOSIS — Z1231 Encounter for screening mammogram for malignant neoplasm of breast: Secondary | ICD-10-CM

## 2011-07-28 ENCOUNTER — Ambulatory Visit (INDEPENDENT_AMBULATORY_CARE_PROVIDER_SITE_OTHER): Payer: Medicare Other | Admitting: Family

## 2011-07-28 ENCOUNTER — Encounter: Payer: Self-pay | Admitting: Family

## 2011-07-28 ENCOUNTER — Other Ambulatory Visit: Payer: Self-pay

## 2011-07-28 ENCOUNTER — Ambulatory Visit (HOSPITAL_BASED_OUTPATIENT_CLINIC_OR_DEPARTMENT_OTHER)
Admission: RE | Admit: 2011-07-28 | Discharge: 2011-07-28 | Disposition: A | Discharge status: Home / Self Care | Payer: Medicare Other | Source: Ambulatory Visit | Attending: Family | Admitting: Family

## 2011-07-28 ENCOUNTER — Emergency Department (HOSPITAL_BASED_OUTPATIENT_CLINIC_OR_DEPARTMENT_OTHER)
Admission: EM | Admit: 2011-07-28 | Discharge: 2011-07-28 | Disposition: A | Discharge status: Home / Self Care | Payer: Medicare Other | Attending: Emergency Medicine | Admitting: Emergency Medicine

## 2011-07-28 ENCOUNTER — Encounter (HOSPITAL_BASED_OUTPATIENT_CLINIC_OR_DEPARTMENT_OTHER): Payer: Self-pay | Admitting: *Deleted

## 2011-07-28 ENCOUNTER — Emergency Department (INDEPENDENT_AMBULATORY_CARE_PROVIDER_SITE_OTHER): Payer: Medicare Other

## 2011-07-28 DIAGNOSIS — I82409 Acute embolism and thrombosis of unspecified deep veins of unspecified lower extremity: Secondary | ICD-10-CM | POA: Insufficient documentation

## 2011-07-28 DIAGNOSIS — R609 Edema, unspecified: Secondary | ICD-10-CM

## 2011-07-28 DIAGNOSIS — R600 Localized edema: Secondary | ICD-10-CM

## 2011-07-28 DIAGNOSIS — M79609 Pain in unspecified limb: Secondary | ICD-10-CM | POA: Insufficient documentation

## 2011-07-28 DIAGNOSIS — Z86718 Personal history of other venous thrombosis and embolism: Secondary | ICD-10-CM | POA: Insufficient documentation

## 2011-07-28 DIAGNOSIS — I2510 Atherosclerotic heart disease of native coronary artery without angina pectoris: Secondary | ICD-10-CM

## 2011-07-28 DIAGNOSIS — M545 Low back pain: Secondary | ICD-10-CM

## 2011-07-28 DIAGNOSIS — R5383 Other fatigue: Secondary | ICD-10-CM

## 2011-07-28 DIAGNOSIS — E119 Type 2 diabetes mellitus without complications: Secondary | ICD-10-CM | POA: Insufficient documentation

## 2011-07-28 DIAGNOSIS — M7989 Other specified soft tissue disorders: Secondary | ICD-10-CM | POA: Insufficient documentation

## 2011-07-28 DIAGNOSIS — I824Y9 Acute embolism and thrombosis of unspecified deep veins of unspecified proximal lower extremity: Secondary | ICD-10-CM

## 2011-07-28 DIAGNOSIS — Z79899 Other long term (current) drug therapy: Secondary | ICD-10-CM | POA: Insufficient documentation

## 2011-07-28 DIAGNOSIS — R0602 Shortness of breath: Secondary | ICD-10-CM

## 2011-07-28 DIAGNOSIS — I2699 Other pulmonary embolism without acute cor pulmonale: Secondary | ICD-10-CM

## 2011-07-28 DIAGNOSIS — T300 Burn of unspecified body region, unspecified degree: Secondary | ICD-10-CM

## 2011-07-28 DIAGNOSIS — D649 Anemia, unspecified: Secondary | ICD-10-CM

## 2011-07-28 DIAGNOSIS — I10 Essential (primary) hypertension: Secondary | ICD-10-CM | POA: Insufficient documentation

## 2011-07-28 DIAGNOSIS — R5381 Other malaise: Secondary | ICD-10-CM

## 2011-07-28 DIAGNOSIS — N3281 Overactive bladder: Secondary | ICD-10-CM

## 2011-07-28 DIAGNOSIS — N318 Other neuromuscular dysfunction of bladder: Secondary | ICD-10-CM

## 2011-07-28 LAB — APTT: aPTT: 35 seconds (ref 24–37)

## 2011-07-28 LAB — CBC
Platelets: 134 10*3/uL — ABNORMAL LOW (ref 150–400)
RBC: 4.35 MIL/uL (ref 3.87–5.11)
RDW: 13.5 % (ref 11.5–15.5)
WBC: 8.1 10*3/uL (ref 4.0–10.5)

## 2011-07-28 LAB — COMPREHENSIVE METABOLIC PANEL
ALT: 17 U/L (ref 0–35)
AST: 20 U/L (ref 0–37)
Albumin: 3.6 g/dL (ref 3.5–5.2)
Alkaline Phosphatase: 87 U/L (ref 39–117)
Chloride: 102 mEq/L (ref 96–112)
Potassium: 3.6 mEq/L (ref 3.5–5.1)
Total Bilirubin: 0.3 mg/dL (ref 0.3–1.2)

## 2011-07-28 LAB — IRON AND TIBC
%SAT: 13 % — ABNORMAL LOW (ref 20–55)
Iron: 37 ug/dL — ABNORMAL LOW (ref 42–145)

## 2011-07-28 LAB — TSH: TSH: 1.569 u[IU]/mL (ref 0.350–4.500)

## 2011-07-28 LAB — GLUCOSE, CAPILLARY: Glucose-Capillary: 254 mg/dL — ABNORMAL HIGH (ref 70–99)

## 2011-07-28 LAB — PROTIME-INR: Prothrombin Time: 13.9 seconds (ref 11.6–15.2)

## 2011-07-28 LAB — VITAMIN B12: Vitamin B-12: 563 pg/mL (ref 211–911)

## 2011-07-28 MED ORDER — INSULIN ASPART 100 UNIT/ML ~~LOC~~ SOLN
12.0000 [IU] | Freq: Once | SUBCUTANEOUS | Status: AC
  Administered 2011-07-28: 12 [IU] via SUBCUTANEOUS
  Filled 2011-07-28: qty 3

## 2011-07-28 MED ORDER — IOHEXOL 350 MG/ML SOLN
80.0000 mL | Freq: Once | INTRAVENOUS | Status: AC | PRN
  Administered 2011-07-28: 80 mL via INTRAVENOUS

## 2011-07-28 MED ORDER — SILVER SULFADIAZINE 1 % EX CREA: TOPICAL_CREAM | Freq: Every day | CUTANEOUS | Status: DC

## 2011-07-28 MED ORDER — IOHEXOL 300 MG/ML  SOLN: 80.0000 mL | Freq: Once | INTRAMUSCULAR | Status: DC | PRN

## 2011-07-28 MED ORDER — ENOXAPARIN SODIUM 60 MG/0.6ML ~~LOC~~ SOLN
1.0000 mg/kg | Freq: Once | SUBCUTANEOUS | Status: AC
  Administered 2011-07-28: 92 mg via SUBCUTANEOUS
  Filled 2011-07-28: qty 0.3

## 2011-07-28 MED ORDER — FESOTERODINE FUMARATE ER 4 MG PO TB24: 4.0000 mg | ORAL_TABLET | Freq: Every day | ORAL | Status: DC

## 2011-07-28 MED ORDER — ENOXAPARIN SODIUM 100 MG/ML ~~LOC~~ SOLN
SUBCUTANEOUS | Status: AC
  Filled 2011-07-28: qty 1

## 2011-07-28 NOTE — Patient Instructions (Addendum)
Please call us to let us know if you are taking amlodipine (norvasc) and hydrochlorothiazide. You may use tylenol for your lower back pain- call us if the pain worsens or does not improve. Head down to the imaging department to complete your leg ultrasound.
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
## 2011-07-28 NOTE — ED Notes (Signed)
Patient uses bedpan without difficulty. She voices no additional complaints at this time.

## 2011-07-28 NOTE — ED Notes (Signed)
Report called to [facility] ICU Mark C

## 2011-07-28 NOTE — Assessment & Plan Note (Signed)
LE doppler + for DVT.  I spoke with radiology technician at [facility] and she told me that the pt had been sent directly to the ED.

## 2011-07-28 NOTE — Assessment & Plan Note (Signed)
Will check TSH
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
## 2011-07-28 NOTE — Assessment & Plan Note (Signed)
Trial of Toviaz- samples provided.

## 2011-07-28 NOTE — ED Notes (Signed)
Report given to [facility]

## 2011-07-28 NOTE — Assessment & Plan Note (Signed)
Recommended tylenol PRN.  If symptoms worsen consider further imaging.

## 2011-07-28 NOTE — ED Provider Notes (Addendum)
History  Scribed for Dr. Rosalia Hammers, the patient was seen in room 13. The chart was scribed by Gilman Schmidt. The patients care was started at 2016.  CSN: [id number] Arrival date & time: 07/28/2011  5:46 PM  Chief Complaint  Patient presents with  . DVT   HPI [name] is a 70 y.o. female with a history of DVT, DM.  Lupus, and Arthritis, who presents to the Emergency Department complaining of DVT in left leg. Pt  was referred to ED by PCP. States that this is fourth clot and the last one was a few months ago. Describes pain in back of calf with associated symptoms of SOB(onset two weeks ago). Denies any history of clot in lungs. There are no other associated symptoms and no other alleviating or aggravating factors.  Denies currently being on blood thinner.  PCP: Dr. Daryel Gerald  HPI ELEMENTS:  Location: left leg  Onset: SOB two weeks ago Duration: persistent since onset  Timing: intermittent  Context: as above  Associated symptoms: pain in calf, SOB  PAST MEDICAL HISTORY:  Past Medical History  Diagnosis Date  . Hyperplastic colon polyp   . Hypertension   . Diabetes mellitus type II   . Osteoarthritis   . GERD (gastroesophageal reflux disease)   . Osteopenia   . Lupus     of skin see dermatologist frequently  . 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 OA (osteoarthritis of spine)     C-spine  . DVT (deep venous thrombosis)     right leg DVT 05-17-2008, to d/c coumadin 11/09  . Leg swelling     left leg 2-10, u/s showed a old clot, has a hypercoag panel, f/u by hematology  . History of cardiovascular stress test     10/09 had CP, stress test neg, likely GI (GERD)-09/2006-had CP: stress test (-)  . 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 Lupus   . DVT (deep vein thrombosis) in pregnancy      PAST SURGICAL HISTORY:  Past Surgical History  Procedure Date  . Cholecystectomy   . Breast biopsy    left breast  . Breast lumpectomy     left breast-ductal carcinoma in situ  . Appendectomy   . Skin cancer excision     from back      MEDICATIONS:  Previous Medications   AMLODIPINE (NORVASC) 10 MG TABLET    Take 10 mg by mouth.     CALCIUM CARBONATE-VITAMIN D (CALCIUM-VITAMIN D) 600-200 MG-UNIT CAPS    Take 1 tablet by mouth 2 (two) times daily.    CHOLECALCIFEROL (VITAMIN D3) 1000000 UNIT/GM LIQD    Take 2,000 Units by mouth. 2,000 international untis (one drop) by mouth once daily at bedtime    FESOTERODINE (TOVIAZ) 4 MG TB24    Take 1 tablet (4 mg total) by mouth daily.   FISH OIL-OMEGA-3 FATTY ACIDS 1000 MG CAPSULE    Take by mouth daily.    FUROSEMIDE (LASIX) 20 MG TABLET    Take 20 mg by mouth daily. Only as needed for swelling.   GARLIC 500 MG CAPS    Take 500 mg by mouth 2 (two) times daily.     GLUCOSAMINE-CHONDROITIN 500-400 MG TABLET    Take 1 tablet by mouth 2 (two) times daily.     GLUCOSE BLOOD (FREESTYLE LITE) TEST STRIP    1  each by Other route as needed. Use as instructed to check blood sugar four times daily (250.00)   HYDROCHLOROTHIAZIDE (,MICROZIDE/HYDRODIURIL,) 12.5 MG CAPSULE    TAKE 1 CAPSULE (12.5 MG TOTAL) BY MOUTH DAILY.   INSULIN ASPART (NOVOLOG) 100 UNIT/ML INJECTION    Inject 8-16 Units into the skin 3 (three) times daily.    INSULIN GLARGINE (LANTUS) 100 UNIT/ML INJECTION    Inject 40 Units into the skin 2 (two) times daily.    INSULIN PEN NEEDLE (PEN NEEDLES 29GX1/2") 29G X MISC    by Does not apply route. Use three times a day for insulin injection    LACTOBACILLUS (ACIDOPHILUS PO)    Take 1 tablet by mouth daily.    LISINOPRIL (PRINIVIL,ZESTRIL) 40 MG TABLET    Take 1 tablet (40 mg total) by mouth daily.   NIACIN 500 MG TABLET    Take 500 mg by mouth daily with breakfast.     SILVER SULFADIAZINE (SILVADENE) 1 % CREAM    Apply topically daily.     ALLERGIES:  Allergies as of 07/28/2011 - Review Complete 07/28/2011  Allergen Reaction Noted  .  Fluconazole Other (See Comments) 09/18/2010  . Metformin Diarrhea 06/24/2007  . Pioglitazone Other (See Comments) 06/24/2007  . Vancomycin  06/24/2011     FAMILY HISTORY:  Family History  Problem Relation Age of Onset  . Diabetes Mother   . Diabetes    . Heart attack Neg Hx   . Colon cancer Neg Hx   . Breast cancer Neg Hx      SOCIAL HISTORY: History  Substance Use Topics  . Smoking status: Never Smoker   . Smokeless tobacco: Never Used  . Alcohol Use: No    Review of Systems  Respiratory: Positive for shortness of breath.   Musculoskeletal:       Pain in calf   All other systems reviewed and are negative.    Physical Exam  BP 180/54  Pulse 80  Temp(Src) 98.6 F (37 C) (Oral)  Resp 16  Wt 203 lb (92.08 kg)  SpO2 99%  Physical Exam  Nursing note and vitals reviewed. Constitutional: She is oriented to person, place, and time. She appears well-developed and well-nourished.  HENT:  Head: Normocephalic and atraumatic.  Eyes: Conjunctivae and EOM are normal. Pupils are equal, round, and reactive to light.  Neck: Normal range of motion. Neck supple.  Cardiovascular: Normal rate and regular rhythm.   Pulmonary/Chest: Effort normal and breath sounds normal.  Abdominal: Soft. Bowel sounds are normal.  Musculoskeletal: Normal range of motion.  Neurological: She is alert and oriented to person, place, and time.  Skin: Skin is warm and dry.  Psychiatric: She has a normal mood and affect.    OTHER DATA REVIEWED: Nursing notes, vital signs, and past medical records reviewed.    DIAGNOSTIC STUDIES: Oxygen Saturation is 99% on room air, normal by my interpretation.    LABS: Results for orders placed during the hospital encounter of 07/28/11  PROTIME-INR      Component Value Range   Prothrombin Time 13.9  11.6 - 15.2 (seconds)   INR 1.05  0.00 - 1.49   APTT      Component Value Range   aPTT 35  24 - 37 (seconds)  CBC      Component Value Range   WBC 8.1  4.0 -  10.5 (K/uL)   RBC 4.35  3.87 - 5.11 (MIL/uL)   Hemoglobin 11.7 (*) 12.0 - 15.0 (g/dL)  HCT 35.6 (*) 36.0 - 46.0 (%)   MCV 81.8  78.0 - 100.0 (fL)   MCH 26.9  26.0 - 34.0 (pg)   MCHC 32.9  30.0 - 36.0 (g/dL)   RDW 16.1  09.6 - 04.5 (%)   Platelets 134 (*) 150 - 400 (K/uL)  COMPREHENSIVE METABOLIC PANEL      Component Value Range   Sodium 139  135 - 145 (mEq/L)   Potassium 3.6  3.5 - 5.1 (mEq/L)   Chloride 102  96 - 112 (mEq/L)   CO2 26  19 - 32 (mEq/L)   Glucose, Bld 80  70 - 99 (mg/dL)   BUN 12  6 - 23 (mg/dL)   Creatinine, Ser 4.09  0.50 - 1.10 (mg/dL)   Calcium 9.8  8.4 - 81.1 (mg/dL)   Total Protein 7.4  6.0 - 8.3 (g/dL)   Albumin 3.6  3.5 - 5.2 (g/dL)   AST 20  0 - 37 (U/L)   ALT 17  0 - 35 (U/L)   Alkaline Phosphatase 87  39 - 117 (U/L)   Total Bilirubin 0.3  0.3 - 1.2 (mg/dL)   GFR calc non Af Amer >60  >60 (mL/min)   GFR calc Af Amer >60  >60 (mL/min)  GLUCOSE, CAPILLARY      Component Value Range   Glucose-Capillary 74  70 - 99 (mg/dL)     RADIOLOGY:  CT Angio  IMPRESSION: 1. Bulky acute pulmonary embolus within the left main pulmonary artery, extending into subsegmental branches to the left lower lobe. Mild pulmonary embolus within the pulmonary artery to the right lower lobe has more of a web-like appearance, and may be more chronic in nature, given the patient's history of shortness of breath for 2 weeks. 2. Small pulmonary infarct in the periphery of the left lower lobe; minimal right-sided atelectasis noted. 3. Mildly prominent periaortic nodes noted, measuring up to 1.0 cm in short axis. 4. Scattered coronary artery calcifications seen.  Critical Value/emergent results were called by telephone at the time of interpretation on 07/28/2011 at 09:20 p.m. to Dr. Margarita Grizzle, who verbally acknowledged these results.  Original Report Authenticated By: Tonia Ghent, M.D.    MDM: Patient anticoagulated with lovenox.  Patient wishes transport to [facility]  and patient discussed with Dr. Selena Batten and accepted.  Patient remains hemodynamically stable.  Due to size of pe patient will be placed in step down unit.   Results for orders placed during the hospital encounter of 07/28/11  PROTIME-INR      Component Value Range   Prothrombin Time 13.9  11.6 - 15.2 (seconds)   INR 1.05  0.00 - 1.49   APTT      Component Value Range   aPTT 35  24 - 37 (seconds)  CBC      Component Value Range   WBC 8.1  4.0 - 10.5 (K/uL)   RBC 4.35  3.87 - 5.11 (MIL/uL)   Hemoglobin 11.7 (*) 12.0 - 15.0 (g/dL)   HCT 91.4 (*) 78.2 - 46.0 (%)   MCV 81.8  78.0 - 100.0 (fL)   MCH 26.9  26.0 - 34.0 (pg)   MCHC 32.9  30.0 - 36.0 (g/dL)   RDW 95.6  21.3 - 08.6 (%)   Platelets 134 (*) 150 - 400 (K/uL)  COMPREHENSIVE METABOLIC PANEL      Component Value Range   Sodium 139  135 - 145 (mEq/L)   Potassium 3.6  3.5 - 5.1 (mEq/L)   Chloride 102  96 - 112 (mEq/L)   CO2 26  19 - 32 (mEq/L)   Glucose, Bld 80  70 - 99 (mg/dL)   BUN 12  6 - 23 (mg/dL)   Creatinine, Ser 9.14  0.50 - 1.10 (mg/dL)   Calcium 9.8  8.4 - 78.2 (mg/dL)   Total Protein 7.4  6.0 - 8.3 (g/dL)   Albumin 3.6  3.5 - 5.2 (g/dL)   AST 20  0 - 37 (U/L)   ALT 17  0 - 35 (U/L)   Alkaline Phosphatase 87  39 - 117 (U/L)   Total Bilirubin 0.3  0.3 - 1.2 (mg/dL)   GFR calc non Af Amer >60  >60 (mL/min)   GFR calc Af Amer >60  >60 (mL/min)  GLUCOSE, CAPILLARY      Component Value Range   Glucose-Capillary 74  70 - 99 (mg/dL)  GLUCOSE, CAPILLARY      Component Value Range   Glucose-Capillary 254 (*) 70 - 99 (mg/dL)    No information on file.  Date: 07/28/2011  Rate: 80  Rhythm: normal sinus rhythm  QRS Axis: normal  Intervals: normal  ST/T Wave abnormalities: normal  Conduction Disutrbances:none  Narrative Interpretation:   Old EKG Reviewed: none available   CRITICAL CARE NOTE: Critical care time was provided for 30 minutes exclusive of separately billable procedures and treating other patients.  This was  necessary to treat or prevent further deterioration of the following condition, Pulmonary Embolism which the patient had and/or had a high probability of suddenly developing. This involved direct bedside patient care, speaking with family members, review of past medical records, reviewing the results of the laboratory and diagnostic studies, consulting with other physicians, as well as evaluating the effectiveness of the therapy instituted as described.      IMPRESSION: Diagnoses that have been ruled out:  Diagnoses that are still under consideration:  Final diagnoses:      MEDICATIONS GIVEN IN THE E.D.  Medications  cyclobenzaprine (FLEXERIL) 5 MG tablet (not administered)  enoxaparin (LOVENOX) 100 MG/ML injection (not administered)  enoxaparin (LOVENOX) injection 90 mg (92 mg Subcutaneous Given 07/28/11 2050)    DISCHARGE MEDICATIONS: New Prescriptions   No medications on file     Procedures I personally performed the services described in this documentation, which was scribed in my presence. The recorded information has been reviewed and considered.Holli Humbles, MD 07/29/11 [id number]  Hilario Quarry, MD 08/14/11 1308

## 2011-07-28 NOTE — ED Notes (Signed)
Pt gave self 12 units of novolog insulin

## 2011-07-28 NOTE — ED Notes (Signed)
Pt sent here from radiology pos DVT left leg.  Hx of DVT

## 2011-08-05 ENCOUNTER — Encounter: Payer: Self-pay | Admitting: Family

## 2011-08-05 ENCOUNTER — Ambulatory Visit (INDEPENDENT_AMBULATORY_CARE_PROVIDER_SITE_OTHER): Payer: Medicare Other | Admitting: Family

## 2011-08-05 VITALS — BP 130/60 | HR 72 | Temp 97.8°F | Resp 16 | Wt 202.1 lb

## 2011-08-05 DIAGNOSIS — I2699 Other pulmonary embolism without acute cor pulmonale: Secondary | ICD-10-CM

## 2011-08-05 DIAGNOSIS — I82409 Acute embolism and thrombosis of unspecified deep veins of unspecified lower extremity: Secondary | ICD-10-CM

## 2011-08-05 DIAGNOSIS — T300 Burn of unspecified body region, unspecified degree: Secondary | ICD-10-CM

## 2011-08-05 HISTORY — DX: Other pulmonary embolism without acute cor pulmonale: I26.99

## 2011-08-05 LAB — PROTIME-INR: INR: 2.52 — ABNORMAL HIGH (ref <1.50)

## 2011-08-05 MED ORDER — "PEN NEEDLES 1/2"" 29G X 12MM MISC": Status: DC

## 2011-08-05 NOTE — H&P (Signed)
[name], [name]             ACCOUNT NO.:  [id number]  MEDICAL RECORD NO.:  [id number]  LOCATION:  4502                         FACILITY:  [facility]  PHYSICIAN:  Maddalynn Barnard, DO         DATE OF BIRTH:  Jan 02, 1941  DATE OF ADMISSION:  06/03/2011 DATE OF DISCHARGE:                             HISTORY & PHYSICAL   CHIEF COMPLAINT:  C. diff colitis, not responsive to p.o. Flagyl.  HISTORY OF PRESENT ILLNESS:  The patient is a 70 year old female who states that she started having trouble with C. diff colitis in June, and initially, she was placed on Flagyl 500 one p.o. q.8 h.  The patient states she was unable to tolerate this due to cramping.  The dose was reduced to 250 mg 1 p.o. q.8.  The patient states she is no longer having the 20 or so loose stool for a day, but she is only having 5 or 6, but she continues to have 5-6 loose stools a day.  She denies any fevers or chills.  Denies nausea or vomiting.  Denies loss of appetite. Past medical history is significant for: 1. Hyperplastic colon polyp. 2. Hypertension. 3. Diabetes mellitus. 4. Osteoarthritis. 5. GERD. 6. Osteopenia. 7. Lupus. 8. History of breast cancer, status post x-ray therapy. 9. DVT. 10.Superficial phlebitis. 11.She had a stress test in October 2009 that was negative.  PAST SURGICAL HISTORY:  Significant for cholecystectomy, breast biopsy, breast lumpectomy, appendectomy.  Family history is positive for diabetes mellitus.  MEDICATIONS AT HOME: 1. Calcium carbonate plus vitamin D 600/200 mg 2 tablets p.o. daily. 2. Cholecalciferol 2000 units by mouth daily. 3. Flexeril 500 mg 1 tablet by mouth b.i.d. 4. Dicyclomine 10 mg 1 p.o. three times daily p.r.n. cramping. 5. Omega 3 fatty acids 1000 mg 2 p.o. daily. 6. Garlic 500 mg 2 p.o. daily. 7. Humalog 75/25 sliding scale. 8. Prinivil. 9. Zestril 40 mg 1 p.o. daily. 10.Elocon cream apply topically as needed. 11.Multivitamin 1 p.o. daily. 12.Niacin 500 mg  1 p.o. daily with breakfast. 13.Lasix 20 mg 1 p.o. daily. 14.Glucosamine chondroitin 1500/1200 one p.o. b.i.d. 15.Hydrochlorothiazide 12.5 one p.o. daily. 16.Metronidazole 250 mg 1 p.o. three times daily. 17.Cortisporin 0.5% apply topically 2 times daily. 18.Omega 3, 6, and 9 fatty acids 1 p.o. daily. 19.Cipro 500 mg 1 p.o. b.i.d. 20.Florastor 250 mg 1 p.o. daily.  ALLERGIES:  FLUCONAZOLE, METFORMIN, and PIOGLITAZONE.  SOCIAL HISTORY:  No tobacco.  No alcohol.  No recreational drug use. The patient is a Jehovah Witness.  REVIEW OF SYSTEMS:  CONSTITUTIONAL:  Negative for fever.  Negative for chills.  Negative for weakness.  Negative for fatigue.  CNS:  No headaches.  No seizures.  No limb weakness.  ENT:  No nasal congestion. No throat pain.  No coryza.  CARDIOVASCULAR:  No chest pain.  No palpitations.  No orthopnea.  RESPIRATORY:  No cough.  No shortness breath.  No wheezing.  GASTROINTESTINAL:  No nausea.  No vomiting. Positive for 5-6 loose stools a day.  No constipation.  GENITOURINARY: No dysuria.  No hematuria.  No urinary frequency.  RENAL:  No flank pain.  No swelling.  No pruritus.  SKIN:  No  rashes.  No sores.  No lesions.  HEMATOLOGICAL:  No easy bruising.  No purpura.  No clots. LYMPHS:  No lymphadenopathy.  No painful nodes.  No specific lymph swelling.  PSYCHIATRIC:  No anxiety.  No depression.  PHYSICAL EXAMINATION:  VITAL SIGNS:  Temperature 97.2, pulse 91, respiratory rate 18, O2 sat 97% on room air, blood pressure 128/68. GENERAL:  The patient is awake, alert and oriented x3, in no acute distress. HEENT:  Eyes, pupils equal, round, and reactive to light and accommodation.  External ocular movements are intact.  Sclerae nonicteric, noninjected.  Mouth, oral mucosa moist.  No lesions.  No sores.  Pharynx clear.  No erythema.  No exudate. NECK:  Negative for JVD.  Negative for thyromegaly.  Negative for lymphadenopathy. HEART:  Regular rate and rhythm at 90 beats  per minute without murmur, ectopy, or gallops.  No lateral PMI.  No thrills. LUNGS:  Clear to auscultation bilaterally without wheezes, rales, or rhonchi.  No increased work of breathing.  No tactile fremitus. ABDOMEN:  Obese, soft, nontender, nondistended.  Positive bowel sounds. No hepatosplenomegaly or hernias palpated. EXTREMITIES:  A +1 to 2 pitting edema bilaterally.  Somewhat diminished dorsalis pedis and popliteal pulses bilaterally.  No carotid bruits bilaterally. NEUROLOGICAL:  Cranial nerves II through XII grossly intact.  Motor and sensory intact.  Laboratory is pending.  ASSESSMENT: 1. C diff colitis, possibly only partially treated.  I have discussed     this with GI.  We will recheck stool for C. diff and start her on     p.o. vancomycin. 2. Peripheral edema.  The patient has not been on her Lasix due to     concerns of dehydration from her primary care physician.  Clearly,     the patient is not losing that much fluid in her stool.  We will     restart her Lasix and monitor her electrolytes. 3. Lupus. 4. History of deep venous thrombosis.  The patient currently has a     superficial thrombophlebitis. PLAN: 1. We will recheck the patient's stool for C. diff and treat with p.o.     vancomycin. 2. As per GI, clear liquid diet. 3. Fingerstick blood sugars with sliding scale insulin. 4. Restart the patient's diuretics. 5. Continue home meds.  I have spent 45 minutes on this admission.          ______________________________ Fran Lowes, DO     AS/MEDQ  D:  06/03/2011  T:  06/04/2011  Job:  [id number]  cc:   Dr. Juanda Chance at [facility]  Electronically Signed by Fran Lowes DO on 08/05/2011 01:55:12 PM

## 2011-08-05 NOTE — Assessment & Plan Note (Signed)
Clinically compensated today.  Oxygen saturation is stable.  Will refer to coumadin clinic.

## 2011-08-05 NOTE — Patient Instructions (Addendum)
Please complete your blood work on the first floor today and in 1 week. You will be contacted about your referral to the coumadin clinic.  Follow up in 1 month.

## 2011-08-05 NOTE — Assessment & Plan Note (Signed)
INR therapeutic today on coumadin.  Will refer to coumadin clinic for ongoing monitoring.  Will also refer to hematology for further evaluation and recommendations.  I suspect that given her history of recurrent DVT (now with PE), hx of lupus (of skin),  And history of breast cancer, that she will need lifelong coumadin.  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 Will request formal recommendation from hematology.

## 2011-08-05 NOTE — Assessment & Plan Note (Signed)
This is actually improving.  Recommended that she keep the area clean and dry.
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
## 2011-08-06 ENCOUNTER — Telehealth: Payer: Self-pay | Admitting: Family

## 2011-08-06 DIAGNOSIS — Z79899 Other long term (current) drug therapy: Secondary | ICD-10-CM

## 2011-08-06 MED ORDER — WARFARIN SODIUM 5 MG PO TABS: 5.0000 mg | ORAL_TABLET | Freq: Every day | ORAL | Status: DC

## 2011-08-06 NOTE — Telephone Encounter (Signed)
I have her scheduled to See Dr Lorenza Burton at the Coumadin Clinic at [facility] in [place] on October 2nd @11 :20.  Their phone number is [phone]

## 2011-08-06 NOTE — Telephone Encounter (Signed)
Pls call pt and let her know that I would like her to return to the lab on Friday 9/21 for stat PT/INR.  She should decrease her warfarin from 8mg  to 5mg .  I have sent rx to her pharmacy.  We will monitor her level until her apt on 10/2 at coumadin clinic- pls notify her re: apt below as well.

## 2011-08-06 NOTE — Telephone Encounter (Signed)
Call placed [phone], she was informed per Sandford Craze instructions and has verbalized understanding regarding medication, lab appointment and coumadin clinic appointment.  Lab order entered for [facility] for 08/08/2011-Stat.

## 2011-08-08 ENCOUNTER — Other Ambulatory Visit: Payer: Self-pay | Admitting: Internal Medicine

## 2011-08-08 ENCOUNTER — Telehealth: Payer: Self-pay | Admitting: *Deleted

## 2011-08-08 DIAGNOSIS — I2699 Other pulmonary embolism without acute cor pulmonale: Secondary | ICD-10-CM

## 2011-08-08 LAB — PROTIME-INR
INR: 2.55 — ABNORMAL HIGH (ref <1.50)
Prothrombin Time: 28.3 seconds — ABNORMAL HIGH (ref 11.6–15.2)

## 2011-08-08 NOTE — Telephone Encounter (Signed)
She should have PT/INR drawn today stat.  We will determine next blood draw based on today's results.

## 2011-08-08 NOTE — Telephone Encounter (Signed)
Received call from pt wanting to know if she could get a shingles vaccine. Pt states she has never had shingles, thinks she had chicken pox as a child. Advised pt to contact insurance to determine coverage. Please advise if ok for pt to receive vaccine?

## 2011-08-08 NOTE — Telephone Encounter (Signed)
Spoke to pt, advised her we have cancelled 08/12/11 appt with [facility] and will let her know when to return for next PT/INR check once we received results from today's labs.

## 2011-08-08 NOTE — Telephone Encounter (Signed)
Received call from pt stating she is due for STAT labs today. Received reminder call for appt on 08/12/11. Upon chart review, pt was instructed to return to the lab in 1 week for PT/INR check after last visit. Does she need to keep appt on 08/12/11 for bloodwork?

## 2011-08-08 NOTE — Telephone Encounter (Signed)
Yes she may receive vaccine.

## 2011-08-08 NOTE — Telephone Encounter (Signed)
Please call pt and let her know that her INR looks perfect.  She should continue warfarin 5mg  once daily, repeat stat PT/INR on Wednesday 9/26.

## 2011-08-08 NOTE — Telephone Encounter (Signed)
Pt notified. Lab order has been entered and forwarded to the lab.

## 2011-08-11 ENCOUNTER — Encounter: Payer: Self-pay | Admitting: Family

## 2011-08-11 ENCOUNTER — Telehealth: Payer: Self-pay | Admitting: Family

## 2011-08-11 NOTE — Telephone Encounter (Signed)
Opened in error

## 2011-08-12 ENCOUNTER — Ambulatory Visit: Payer: Medicare Other | Admitting: Family

## 2011-08-13 ENCOUNTER — Telehealth: Payer: Self-pay | Admitting: Family

## 2011-08-13 ENCOUNTER — Ambulatory Visit: Payer: Medicare Other | Admitting: Family

## 2011-08-13 NOTE — Telephone Encounter (Signed)
Left message on machine to return my call. Spoke to [employer] at [place]. She will request lab to run specimen STAT once it has been received.

## 2011-08-13 NOTE — Telephone Encounter (Signed)
Her leg is hurting like it did prior to the hospital visit.  She wanted to know about a shingles shot and has not received a call back  She needs to know about her pt inr which she had drawn this am.  There was not stat on that order.

## 2011-08-14 ENCOUNTER — Encounter: Payer: Self-pay | Admitting: Internal Medicine

## 2011-08-14 ENCOUNTER — Ambulatory Visit (INDEPENDENT_AMBULATORY_CARE_PROVIDER_SITE_OTHER): Payer: Medicare Other | Admitting: Internal Medicine

## 2011-08-14 VITALS — BP 106/60 | HR 75 | Temp 97.9°F | Resp 20 | Ht 67.0 in | Wt 202.0 lb

## 2011-08-14 DIAGNOSIS — R079 Chest pain, unspecified: Secondary | ICD-10-CM | POA: Insufficient documentation

## 2011-08-14 DIAGNOSIS — R220 Localized swelling, mass and lump, head: Secondary | ICD-10-CM

## 2011-08-14 DIAGNOSIS — R0602 Shortness of breath: Secondary | ICD-10-CM

## 2011-08-14 DIAGNOSIS — I2699 Other pulmonary embolism without acute cor pulmonale: Secondary | ICD-10-CM

## 2011-08-14 LAB — COMPREHENSIVE METABOLIC PANEL
Alkaline Phosphatase: 91
BUN: 13
CO2: 26
Calcium: 9
GFR calc non Af Amer: >60
Glucose, Bld: 254 — ABNORMAL HIGH
Potassium: 4
Total Protein: 6.7

## 2011-08-14 LAB — HEMOGLOBIN A1C
Hgb A1c MFr Bld: 8.1 — ABNORMAL HIGH
Mean Plasma Glucose: 211

## 2011-08-14 LAB — CBC
HCT: 35 — ABNORMAL LOW
Hemoglobin: 11.7 — ABNORMAL LOW
Hemoglobin: 11.9 — ABNORMAL LOW
MCHC: 34
RBC: 4.22
RDW: 13.6
WBC: 7

## 2011-08-14 LAB — DIFFERENTIAL
Basophils Relative: 1
Eosinophils Absolute: 0.1
Monocytes Relative: 8
Neutro Abs: 5
Neutrophils Relative %: 61

## 2011-08-14 LAB — PROTIME-INR
INR: 0.9
Prothrombin Time: 12.9
Prothrombin Time: 14.1

## 2011-08-14 NOTE — Assessment & Plan Note (Signed)
EKG obtained demonstrates nsr 73 with nl intervals and axis. No obvious acute ischemic changes. No hypoxic at rest or ambulation. Suspect contribution from recent PE with infarct. INR therapeutic. Schedule close f/u 24 hours. Instructed to present to ED with worsening chest pain or dyspnea. States understanding and agreement.

## 2011-08-14 NOTE — Progress Notes (Signed)
Subjective:    Patient ID: [name], female    DOB: 10/28/1941, 70 y.o.   MRN: [id number]  HPI Pt presents to clinic for evaluation of multiple medical problems. Records reviewed. Recent diagnosis of DVT with associated PE and possible small pulmonary infarct on CT. Was hospitalized at [name] for anticoagulation and recent discharged on coumadin. INR 9/26 2.1 and 9/21 2.55. Tolerates coumadin without gross active bleeding. Notes intermittent left sided chest pain without radiation or exertional trigger. Is unable to describe quality or duration but indicates lasts at least minutes. No exacerbating or alleviating factors. Complains that since hospital discharge has developed predominantly right sided facial swelling, redness and warmth. No tongue swelling, dyspnea or difficulty swallowing. Has attempted no medication for the problem. No alleviating or exacerbating factors. Indicates past h/o C. Diff. No other complaints.  Past Medical History  Diagnosis Date  . Hyperplastic colon polyp   . Hypertension   . Diabetes mellitus type II   . Osteoarthritis   . GERD (gastroesophageal reflux disease)   . Osteopenia   . Lupus     of skin see dermatologist frequently  . 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 OA (osteoarthritis of spine)     C-spine  . DVT (deep venous thrombosis)     right leg DVT 05-17-2008  . Leg swelling     left leg 2-10, u/s showed a old clot, has a hypercoag panel,  . History of cardiovascular stress test     10/09 had CP, stress test neg, likely GI (GERD)-09/2006-had CP: stress test (-)  . 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 DVT (deep vein thrombosis) in pregnancy   . DVT of leg (deep venous thrombosis) 07/28/2011  . 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 Cholecystectomy   . Breast biopsy     left breast  . Breast lumpectomy     left  breast-ductal carcinoma in situ  . Appendectomy   . Skin cancer excision     from back     reports that she has never smoked. She has never used smokeless tobacco. She reports that she does not drink alcohol or use illicit drugs. family history includes Diabetes in her mother and unspecified family member.  There is no history of Heart attack, and Colon cancer, and Breast cancer, . Allergies  Allergen Reactions  . Fluconazole Other (See Comments)    REACTION: severe fatigue and muscle weakness  . Metformin Diarrhea  . Pioglitazone Other (See Comments)    REACTION: wt gain  . Vancomycin     Erythematous rash, hands and toes became cyanotic.         Review of Systems see hpi     Objective:   Physical Exam  Constitutional: She appears well-developed and well-nourished. No distress.  HENT:  Head: Normocephalic and atraumatic.  Right Ear: External ear normal.  Left Ear: External ear normal.  Nose: Nose normal.  Mouth/Throat: Oropharynx is clear and moist. No oropharyngeal exudate.  Eyes: Conjunctivae are normal. Right eye exhibits no discharge. Left eye exhibits no discharge. No scleral icterus.  Neck: Neck supple.  Cardiovascular: Normal rate, regular rhythm and normal heart sounds.  Exam reveals no gallop and no friction rub.   No murmur heard. Pulmonary/Chest: Effort normal and breath sounds normal. No respiratory distress. She has no wheezes.  She has no rales.  Neurological: She is alert.  Skin: Skin is warm and dry. Rash noted. She is not diaphoretic. There is erythema. No pallor.       Right face (maxillary area extending below to near mouth) demonstrates mild erythema, mild st swelling and warmth to touch. No wound/ulceration or obvious abscess. No obvious lip, oral or ocular involvement.  Psychiatric: She has a normal mood and affect.          Assessment & Plan:

## 2011-08-14 NOTE — Telephone Encounter (Signed)
Pt was seen in the office today and notified. She states her insurance has sent her a form re: vaccine coverage. Advised pt to bring form to her next appt.

## 2011-08-14 NOTE — Assessment & Plan Note (Signed)
Continue current dose of coumadin. Has coumadin clinic appt 10/2. Pt aware and will keep appt.

## 2011-08-14 NOTE — Telephone Encounter (Signed)
Spoke to Manjul at [place] and received verbal for PT/INR from 08/13/11. PT--24.3   INR--2.10. He will fax result. Per Myriam Jacobson, pt called this a.m. Stating her face was swollen and she was scheduled for appt at 9am today.

## 2011-08-14 NOTE — Assessment & Plan Note (Signed)
Concern over possible cellulitis discussed. Pt repeatedly declines abx tx due to past h/o c. Diff. Schedule close 24 hour followup.

## 2011-08-15 ENCOUNTER — Ambulatory Visit: Payer: Medicare Other | Admitting: Internal Medicine

## 2011-08-15 ENCOUNTER — Ambulatory Visit (INDEPENDENT_AMBULATORY_CARE_PROVIDER_SITE_OTHER): Payer: Medicare Other | Admitting: Family

## 2011-08-15 ENCOUNTER — Ambulatory Visit: Payer: Medicare Other

## 2011-08-15 ENCOUNTER — Encounter: Payer: Self-pay | Admitting: Family

## 2011-08-15 DIAGNOSIS — L0291 Cutaneous abscess, unspecified: Secondary | ICD-10-CM

## 2011-08-15 DIAGNOSIS — L0390 Cellulitis, unspecified: Secondary | ICD-10-CM

## 2011-08-15 LAB — GLUCOSE, CAPILLARY
Glucose-Capillary: 142 — ABNORMAL HIGH
Glucose-Capillary: 146 — ABNORMAL HIGH

## 2011-08-15 LAB — BASIC METABOLIC PANEL
BUN: 16
Calcium: 10
Creatinine, Ser: 0.7
GFR calc non Af Amer: >60
Glucose, Bld: 117 — ABNORMAL HIGH

## 2011-08-15 MED ORDER — ZOSTER VACCINE LIVE 19400 UNT/0.65ML ~~LOC~~ SOLR: 0.6500 mL | Freq: Once | SUBCUTANEOUS | Status: DC

## 2011-08-15 MED ORDER — CEPHALEXIN 500 MG PO CAPS: 500.0000 mg | ORAL_CAPSULE | Freq: Four times a day (QID) | ORAL | Status: AC

## 2011-08-15 NOTE — Patient Instructions (Signed)
Please follow up on Monday. Go the ER over the weekend if you develop increased pain/swelling of the right side of your face.

## 2011-08-15 NOTE — Progress Notes (Signed)
Subjective:    Patient ID: [name], female    DOB: 03-27-1941, 70 y.o.   MRN: [id number]  HPI  Ms.  [name] is a 70 yr old female who presents today for follow up.  She was seen yesterday by Dr. Rodena Medin and was evaluated for complaint of shortness of breath chest pain and facial redness.  She was recently treated for PE/DVT and was noted yesterday to have redness on the right side of her face. She notes that her right cheek is warm to the touch.  Symptoms stared about 24 hours ago and she notes that it may be slightly worse today.    Review of Systems See HPI.   Reports resolution of chest pain and denies significant SOB.     Past Medical History  Diagnosis Date  . Hyperplastic colon polyp   . Hypertension   . Diabetes mellitus type II   . Osteoarthritis   . GERD (gastroesophageal reflux disease)   . Osteopenia   . Lupus     of skin see dermatologist frequently  . 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 OA (osteoarthritis of spine)     C-spine  . DVT (deep venous thrombosis)     right leg DVT 05-17-2008  . Leg swelling     left leg 2-10, u/s showed a old clot, has a hypercoag panel,  . History of cardiovascular stress test     10/09 had CP, stress test neg, likely GI (GERD)-09/2006-had CP: stress test (-)  . 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 DVT (deep vein thrombosis) in pregnancy   . DVT of leg (deep venous thrombosis) 07/28/2011  . 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 Marital Status: Widowed    Spouse Name: N/A    Number of Children: 3  . Years of Education: N/A   Occupational History  . Retired    Social History Main Topics  . Smoking status: Never Smoker   . Smokeless tobacco: Never Used  . Alcohol Use: No  . Drug Use: No  . Sexually Active: Not Currently   Other Topics Concern  . Not on file   Social History Narrative   Single-widow 3 children , 1 in [place] (son, daughter - [place]) [place] to [place] 2002 from [place], [employer] Use - no   tobacco-- never  Illicit Drug Use - no Pt is Jehovah's Witness-No blood productsDaughter - [name]     Past Surgical History  Procedure Date  . Cholecystectomy   . Breast biopsy     left breast  . Breast lumpectomy     left breast-ductal carcinoma in situ  . Appendectomy   . Skin cancer excision     from back     Family History  Problem Relation Age of Onset  . Diabetes Mother   . Diabetes    . Heart attack Neg Hx   . Colon cancer Neg Hx   . Breast cancer Neg Hx     Allergies  Allergen Reactions  . Fluconazole Other (See Comments)    REACTION: severe fatigue and muscle weakness  . Metformin Diarrhea  . Pioglitazone Other (See Comments)    REACTION: wt gain  . Vancomycin     Erythematous rash, hands and toes became cyanotic.      Current Outpatient Prescriptions on  File Prior to Visit  Medication Sig Dispense Refill  . amLODipine (NORVASC) 10 MG tablet Take 10 mg by mouth.        . Calcium Carbonate-Vitamin D (CALCIUM-VITAMIN D) 600-200 MG-UNIT CAPS Take 1 tablet by mouth 2 (two) times daily.       . Cholecalciferol (VITAMIN D3) 1000000 UNIT/GM LIQD Take 2,000 Units by mouth. 2,000 international untis (one drop) by mouth once daily at bedtime       . cyclobenzaprine (FLEXERIL) 5 MG tablet Take 5 mg by mouth 2 (two) times daily as needed. Muscle spasm       . fesoterodine (TOVIAZ) 4 MG TB24 Take 1 tablet (4 mg total) by mouth daily.  28 tablet  0  . fish oil-omega-3 fatty acids 1000 MG capsule Take by mouth daily.       . furosemide (LASIX) 20 MG tablet Take 20 mg by mouth daily. Only as needed for swelling.      . Garlic 500 MG CAPS Take 500 mg by mouth 2 (two) times daily.        Marland Kitchen glucosamine-chondroitin 500-400 MG tablet Take 1 tablet by mouth 2 (two) times daily.        Marland Kitchen glucose blood (FREESTYLE LITE) test strip 1 each by Other route as  needed. Use as instructed to check blood sugar four times daily (250.00)      . hydrochlorothiazide (,MICROZIDE/HYDRODIURIL,) 12.5 MG capsule TAKE 1 CAPSULE (12.5 MG TOTAL) BY MOUTH DAILY.  90 capsule  0  . insulin aspart (NOVOLOG) 100 UNIT/ML injection Inject 8-16 Units into the skin 3 (three) times daily.       . insulin glargine (LANTUS) 100 UNIT/ML injection Inject 40 Units into the skin 2 (two) times daily. 40 units in the am and 35 units at night      . Insulin Pen Needle (PEN NEEDLES 29GX1/2") 29G X MISC Use for lantus solostar pen.  100 each  0  . Lactobacillus (ACIDOPHILUS PO) Take 1 tablet by mouth daily.       Marland Kitchen lisinopril (PRINIVIL,ZESTRIL) 40 MG tablet TAKE 1 TABLET (40 MG TOTAL) BY MOUTH DAILY.  90 tablet  1  . niacin 500 MG tablet Take 500 mg by mouth daily with breakfast.        . silver sulfADIAZINE (SILVADENE) 1 % cream Apply topically daily.  25 g  0  . warfarin (COUMADIN) 5 MG tablet Take 1 tablet (5 mg total) by mouth daily.  30 tablet  0    BP 158/64  Pulse 72  Temp(Src) 97.8 F (36.6 C) (Oral)  Resp 16  Ht 5\' 7"  (1.702 m)  Wt 203 lb (92.08 kg)  BMI 31.79 kg/m2  SpO2 98%    Objective:   Physical Exam  Constitutional: She appears well-developed and well-nourished.  Cardiovascular: Normal rate and regular rhythm.   No murmur heard. Pulmonary/Chest: Effort normal and breath sounds normal. No respiratory distress. She has no wheezes. She has no rales. She exhibits no tenderness.  Musculoskeletal:       Mild swelling of the LLE  Skin: She is not diaphoretic.       + erythema noted of the right cheek, extends downward towards chin.  Area is mildly indurated and warm to touch.          Assessment & Plan:

## 2011-08-15 NOTE — Assessment & Plan Note (Signed)
Pt with cellulitis of face, which has worsened since yesterday. She was examined by myself and by Dr. Rodena Medin who feels that erythema has worsened slightly since yesterday. She has been resistant to initiation of abx due to hx of C. Diff.  I explained to her that this could become very dangerous and has not improved on its own. Will treat with keflex.  Pt is instructed to go to the ER if symptoms worsen over the weekend, otherwise follow up on Monday. She verbalizes understanding.

## 2011-08-18 ENCOUNTER — Ambulatory Visit: Payer: Medicare Other | Admitting: Family

## 2011-08-18 ENCOUNTER — Ambulatory Visit (INDEPENDENT_AMBULATORY_CARE_PROVIDER_SITE_OTHER): Payer: Medicare Other | Admitting: Family

## 2011-08-18 ENCOUNTER — Encounter: Payer: Self-pay | Admitting: Family

## 2011-08-18 VITALS — BP 132/72 | HR 84 | Temp 97.8°F | Resp 16 | Ht 67.0 in | Wt 205.0 lb

## 2011-08-18 DIAGNOSIS — Z23 Encounter for immunization: Secondary | ICD-10-CM

## 2011-08-18 DIAGNOSIS — I82409 Acute embolism and thrombosis of unspecified deep veins of unspecified lower extremity: Secondary | ICD-10-CM

## 2011-08-18 DIAGNOSIS — L0390 Cellulitis, unspecified: Secondary | ICD-10-CM

## 2011-08-18 DIAGNOSIS — L0291 Cutaneous abscess, unspecified: Secondary | ICD-10-CM

## 2011-08-18 LAB — GLUCOSE, CAPILLARY
Glucose-Capillary: 240 — ABNORMAL HIGH
Glucose-Capillary: 347 — ABNORMAL HIGH
Glucose-Capillary: 385 — ABNORMAL HIGH

## 2011-08-18 LAB — CBC
HCT: 41
Hemoglobin: 13.1
RDW: 14.4

## 2011-08-18 LAB — CARDIAC PANEL(CRET KIN+CKTOT+MB+TROPI)
CK, MB: 2.5
Relative Index: 2.2
Total CK: 112
Troponin I: 0.02

## 2011-08-18 LAB — URINALYSIS, ROUTINE W REFLEX MICROSCOPIC
Glucose, UA: NEGATIVE
Protein, ur: NEGATIVE
Specific Gravity, Urine: 1.012
Urobilinogen, UA: 0.2

## 2011-08-18 LAB — PROTIME-INR
INR: 3.2 — ABNORMAL HIGH
INR: 3.5 — ABNORMAL HIGH
Prothrombin Time: 38.1 — ABNORMAL HIGH

## 2011-08-18 LAB — APTT: aPTT: 40 — ABNORMAL HIGH

## 2011-08-18 LAB — URINE MICROSCOPIC-ADD ON

## 2011-08-18 LAB — BASIC METABOLIC PANEL
GFR calc non Af Amer: >60
Glucose, Bld: 282 — ABNORMAL HIGH
Potassium: 4.2
Sodium: 136

## 2011-08-18 LAB — DIFFERENTIAL
Eosinophils Relative: 1
Lymphocytes Relative: 29
Lymphs Abs: 2.8
Neutro Abs: 6
Neutrophils Relative %: 62

## 2011-08-18 LAB — TROPONIN I: Troponin I: 0.02

## 2011-08-18 LAB — CK TOTAL AND CKMB (NOT AT ARMC): Relative Index: 2.3

## 2011-08-18 NOTE — Progress Notes (Signed)
Subjective:    Patient ID: [name], female    DOB: [birth year], 70 y.o.   MRN: [id number]  HPI  [name] is a 70 yr old female who presents today for follow up of her right facial cellulitis.  She was started on Keflex 3 days ago.  Since that time she notes improvement in the redness, swelling and warmth.    DVT/PE- she is scheduled to establish at the coumadin clinic tomorrow.     Review of Systems See HPI  Past Medical History  Diagnosis Date  . Hyperplastic colon polyp   . Hypertension   . Diabetes mellitus type II   . Osteoarthritis   . GERD (gastroesophageal reflux disease)   . Osteopenia   . Lupus     of skin see dermatologist frequently  . 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 OA (osteoarthritis of spine)     C-spine  . DVT (deep venous thrombosis)     right leg DVT 05-17-2008  . Leg swelling     left leg 2-10, u/s showed a old clot, has a hypercoag panel,  . History of cardiovascular stress test     10/09 had CP, stress test neg, likely GI (GERD)-09/2006-had CP: stress test (-)  . 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 DVT (deep vein thrombosis) in pregnancy   . DVT of leg (deep venous thrombosis) 07/28/2011  . 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 Marital Status: Widowed    Spouse Name: N/A    Number of Children: 3  . Years of Education: N/A   Occupational History  . Retired    Social History Main Topics  . Smoking status: Never Smoker   . Smokeless tobacco: Never Used  . Alcohol Use: No  . Drug Use: No  . Sexually Active: Not Currently   Other Topics Concern  . Not on file   Social History Narrative   Single-widow 3 children , 1 in [place] (son, daughter - ) [place] to [place] 2002 from [place], [employer] Use - no   tobacco-- never  Illicit Drug Use - no Pt is Jehovah's Witness-No blood productsDaughter - [name]       Past Surgical History  Procedure Date  . Cholecystectomy   . Breast biopsy     left breast  . Breast lumpectomy     left breast-ductal carcinoma in situ  . Appendectomy   . Skin cancer excision     from back     Family History  Problem Relation Age of Onset  . Diabetes Mother   . Diabetes    . Heart attack Neg Hx   . Colon cancer Neg Hx   . Breast cancer Neg Hx     Allergies  Allergen Reactions  . Fluconazole Other (See Comments)    REACTION: severe fatigue and muscle weakness  . Metformin Diarrhea  . Pioglitazone Other (See Comments)    REACTION: wt gain  . Vancomycin     Erythematous rash, hands and toes became cyanotic.      Current Outpatient Prescriptions on File Prior to Visit  Medication Sig Dispense Refill  . amLODipine (NORVASC) 10 MG tablet Take 10 mg by mouth.        . Calcium Carbonate-Vitamin D (CALCIUM-VITAMIN D) 600-200 MG-UNIT CAPS Take 1 tablet by mouth  2 (two) times daily.       . cephALEXin (KEFLEX) 500 MG capsule Take 1 capsule (500 mg total) by mouth 4 (four) times daily.  40 capsule  0  . Cholecalciferol (VITAMIN D3) 1000000 UNIT/GM LIQD Take 2,000 Units by mouth. 2,000 international untis (one drop) by mouth once daily at bedtime       . cyclobenzaprine (FLEXERIL) 5 MG tablet Take 5 mg by mouth 2 (two) times daily as needed. Muscle spasm       . fesoterodine (TOVIAZ) 4 MG TB24 Take 1 tablet (4 mg total) by mouth daily.  28 tablet  0  . fish oil-omega-3 fatty acids 1000 MG capsule Take by mouth daily.       . furosemide (LASIX) 20 MG tablet Take 20 mg by mouth daily. Only as needed for swelling.      . Garlic 500 MG CAPS Take 500 mg by mouth 2 (two) times daily.        Marland Kitchen glucosamine-chondroitin 500-400 MG tablet Take 1 tablet by mouth 2 (two) times daily.        Marland Kitchen glucose blood (FREESTYLE LITE) test strip 1 each by Other route as needed. Use as instructed to check blood sugar four times daily (250.00)      . hydrochlorothiazide  (,MICROZIDE/HYDRODIURIL,) 12.5 MG capsule TAKE 1 CAPSULE (12.5 MG TOTAL) BY MOUTH DAILY.  90 capsule  0  . insulin aspart (NOVOLOG) 100 UNIT/ML injection Inject 8-16 Units into the skin 3 (three) times daily.       . insulin glargine (LANTUS) 100 UNIT/ML injection Inject 40 Units into the skin 2 (two) times daily. 40 units in the am and 35 units at night      . Insulin Pen Needle (PEN NEEDLES 29GX1/2") 29G X MISC Use for lantus solostar pen.  100 each  0  . Lactobacillus (ACIDOPHILUS PO) Take 1 tablet by mouth daily.       Marland Kitchen lisinopril (PRINIVIL,ZESTRIL) 40 MG tablet TAKE 1 TABLET (40 MG TOTAL) BY MOUTH DAILY.  90 tablet  1  . niacin 500 MG tablet Take 500 mg by mouth daily with breakfast.        . silver sulfADIAZINE (SILVADENE) 1 % cream Apply topically daily.  25 g  0  . warfarin (COUMADIN) 5 MG tablet Take 1 tablet (5 mg total) by mouth daily.  30 tablet  0  . zoster vaccine live, PF, (ZOSTAVAX) 98119 UNT/0.65ML injection Inject 19,400 Units into the skin once.  1 vial  0    BP 132/72  Pulse 84  Temp(Src) 97.8 F (36.6 C) (Oral)  Resp 16  Ht 5\' 7"  (1.702 m)  Wt 205 lb 0.6 oz (93.006 kg)  BMI 32.11 kg/m2       Objective:   Physical Exam  Constitutional: She appears well-developed and well-nourished.  Cardiovascular: Normal rate and regular rhythm.   No murmur heard. Pulmonary/Chest: Effort normal and breath sounds normal. No respiratory distress. She has no wheezes. She has no rales. She exhibits no tenderness.  Skin: She is not diaphoretic.       Near resolution of the swelling, redness, induration on the right side of her face.   Psychiatric: She has a normal mood and affect. Her behavior is normal. Judgment and thought content normal.          Assessment & Plan:

## 2011-08-18 NOTE — Assessment & Plan Note (Signed)
This is nearly resolved.  I have instructed pt to complete abx and contact us if symptoms return or if they do not continue to resolve.  Pt verbalizes understanding.

## 2011-08-18 NOTE — Patient Instructions (Signed)
Please keep your appointment with the coumadin clinic tomorrow. Complete your Keflex (antibiotics). Call if you develop increasing redness, pain swelling of your face or if your symptoms do not continue to resolve.  Follow up in 2 montns, sooner if problems or concerns.

## 2011-08-18 NOTE — Assessment & Plan Note (Signed)
Continue coumadin- pt to establish tomorrow with coumadin clinic. I advised her to let them know that she is completing abx.  Will check on status of hematology referral.

## 2011-08-20 ENCOUNTER — Encounter: Payer: Self-pay | Admitting: Family

## 2011-08-20 ENCOUNTER — Ambulatory Visit (INDEPENDENT_AMBULATORY_CARE_PROVIDER_SITE_OTHER): Payer: Medicare Other | Admitting: Family

## 2011-08-20 ENCOUNTER — Ambulatory Visit: Payer: Medicare Other | Admitting: Family

## 2011-08-20 ENCOUNTER — Encounter (HOSPITAL_BASED_OUTPATIENT_CLINIC_OR_DEPARTMENT_OTHER): Payer: Self-pay

## 2011-08-20 ENCOUNTER — Telehealth: Payer: Self-pay | Admitting: Family

## 2011-08-20 ENCOUNTER — Ambulatory Visit (HOSPITAL_BASED_OUTPATIENT_CLINIC_OR_DEPARTMENT_OTHER)
Admission: RE | Admit: 2011-08-20 | Discharge: 2011-08-20 | Disposition: A | Discharge status: Home / Self Care | Payer: Medicare Other | Source: Ambulatory Visit | Attending: Family | Admitting: Family

## 2011-08-20 DIAGNOSIS — L0390 Cellulitis, unspecified: Secondary | ICD-10-CM

## 2011-08-20 DIAGNOSIS — I2699 Other pulmonary embolism without acute cor pulmonale: Secondary | ICD-10-CM

## 2011-08-20 DIAGNOSIS — H05019 Cellulitis of unspecified orbit: Secondary | ICD-10-CM | POA: Insufficient documentation

## 2011-08-20 DIAGNOSIS — H5710 Ocular pain, unspecified eye: Secondary | ICD-10-CM | POA: Insufficient documentation

## 2011-08-20 MED ORDER — DOXYCYCLINE HYCLATE 100 MG PO TABS: 100.0000 mg | ORAL_TABLET | Freq: Two times a day (BID) | ORAL | Status: AC

## 2011-08-20 MED ORDER — IOHEXOL 300 MG/ML  SOLN
80.0000 mL | Freq: Once | INTRAMUSCULAR | Status: AC | PRN
  Administered 2011-08-20: 80 mL via INTRAVENOUS

## 2011-08-20 NOTE — Progress Notes (Signed)
Subjective:    Patient ID: [name], female    DOB: 1941/05/18, 70 y.o.   MRN: [id number]  HPI  Ms.  Wixted is a 70 yr old female who presents today with complaint of swelling/itching around her right eye.  She was initially seen by Dr. Rodena Medin on 9/27 with complaint of some right sided facial redness.  It was recommended that she start antibiotic therapy at that time but she refused due to recent episode of c diff colitis. She returned the following day and erythema had worsened. She was then agreeable to initiation of antibiotics.  She was placed on Keflex at that time.  She then followed back up on 10/1 and was noted to have near resolution of the induration and erythema on the right side of her face.  She was instructed to complete keflex and follow up if symptoms worsened or did not continue to improve.   Today she reports that she has developed redness and itching surrounding the right eye. She has been applying an OTC benadryl anti-itch gel with only minimal improvement.  She denies any problems with vision in her right eye.  Notes that the eye was nearly swollen shut this morning when she woke up but now has improved.  Denies associated drainage or fever.  Reports + compliance with keflex.     Review of Systems See HPI  Past Medical History  Diagnosis Date  . Hyperplastic colon polyp   . Hypertension   . Diabetes mellitus type II   . Osteoarthritis   . GERD (gastroesophageal reflux disease)   . Osteopenia   . Lupus     of skin see dermatologist frequently  . 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 OA (osteoarthritis of spine)     C-spine  . DVT (deep venous thrombosis)     right leg DVT 05-17-2008  . Leg swelling     left leg 2-10, u/s showed a old clot, has a hypercoag panel,  . History of cardiovascular stress test     10/09 had CP, stress test neg, likely GI (GERD)-09/2006-had CP: stress  test (-)  . 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 DVT (deep vein thrombosis) in pregnancy   . DVT of leg (deep venous thrombosis) 07/28/2011  . 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 Marital Status: Widowed    Spouse Name: N/A    Number of Children: 3  . Years of Education: N/A   Occupational History  . Retired    Social History Main Topics  . Smoking status: Never Smoker   . Smokeless tobacco: Never Used  . Alcohol Use: No  . Drug Use: No  . Sexually Active: Not Currently   Other Topics Concern  . Not on file   Social History Narrative   Single-widow 3 children , 1 in [place] (son, daughter - [place]) [place] to [place] 2002 from [place], [employer] Use - no   tobacco-- never  Illicit Drug Use - no Pt is Jehovah's Witness-No blood productsDaughter - [name]     Past Surgical History  Procedure Date  . Cholecystectomy   . Breast biopsy     left breast  . Breast lumpectomy     left breast-ductal carcinoma in situ  . Appendectomy   . Skin cancer excision  from back     Family History  Problem Relation Age of Onset  . Diabetes Mother   . Diabetes    . Heart attack Neg Hx   . Colon cancer Neg Hx   . Breast cancer Neg Hx     Allergies  Allergen Reactions  . Fluconazole Other (See Comments)    REACTION: severe fatigue and muscle weakness  . Metformin Diarrhea  . Pioglitazone Other (See Comments)    REACTION: wt gain  . Vancomycin     Erythematous rash, hands and toes became cyanotic.      Current Outpatient Prescriptions on File Prior to Visit  Medication Sig Dispense Refill  . amLODipine (NORVASC) 10 MG tablet Take 10 mg by mouth.        . Calcium Carbonate-Vitamin D (CALCIUM-VITAMIN D) 600-200 MG-UNIT CAPS Take 1 tablet by mouth 2 (two) times daily.       . cephALEXin (KEFLEX) 500 MG capsule Take 1 capsule (500 mg total) by mouth 4 (four) times daily.  40 capsule  0  . Cholecalciferol (VITAMIN D3) 1000000 UNIT/GM  LIQD Take 2,000 Units by mouth. 2,000 international untis (one drop) by mouth once daily at bedtime       . cyclobenzaprine (FLEXERIL) 5 MG tablet Take 5 mg by mouth 2 (two) times daily as needed. Muscle spasm       . fesoterodine (TOVIAZ) 4 MG TB24 Take 1 tablet (4 mg total) by mouth daily.  28 tablet  0  . fish oil-omega-3 fatty acids 1000 MG capsule Take by mouth daily.       . furosemide (LASIX) 20 MG tablet Take 20 mg by mouth daily. Only as needed for swelling.      . Garlic 500 MG CAPS Take 500 mg by mouth 2 (two) times daily.        Marland Kitchen glucosamine-chondroitin 500-400 MG tablet Take 1 tablet by mouth 2 (two) times daily.        Marland Kitchen glucose blood (FREESTYLE LITE) test strip 1 each by Other route as needed. Use as instructed to check blood sugar four times daily (250.00)      . hydrochlorothiazide (,MICROZIDE/HYDRODIURIL,) 12.5 MG capsule TAKE 1 CAPSULE (12.5 MG TOTAL) BY MOUTH DAILY.  90 capsule  0  . insulin aspart (NOVOLOG) 100 UNIT/ML injection Inject 8-16 Units into the skin 3 (three) times daily.       . insulin glargine (LANTUS) 100 UNIT/ML injection Inject 40 Units into the skin 2 (two) times daily. 40 units in the am and 35 units at night      . Insulin Pen Needle (PEN NEEDLES 29GX1/2") 29G X MISC Use for lantus solostar pen.  100 each  0  . Lactobacillus (ACIDOPHILUS PO) Take 1 tablet by mouth daily.       Marland Kitchen lisinopril (PRINIVIL,ZESTRIL) 40 MG tablet TAKE 1 TABLET (40 MG TOTAL) BY MOUTH DAILY.  90 tablet  1  . niacin 500 MG tablet Take 500 mg by mouth daily with breakfast.        . silver sulfADIAZINE (SILVADENE) 1 % cream Apply topically daily.  25 g  0  . warfarin (COUMADIN) 5 MG tablet Take 1 tablet (5 mg total) by mouth daily.  30 tablet  0  . zoster vaccine live, PF, (ZOSTAVAX) 16109 UNT/0.65ML injection Inject 19,400 Units into the skin once.  1 vial  0   No current facility-administered medications on file prior to visit.    BP 130/66  Pulse 72  Temp(Src) 98.2 F (36.8  C) (Oral)  Resp 16  Wt 205 lb (92.987 kg)       Objective:   Physical Exam  Constitutional: She appears well-developed and well-nourished. No distress.  Eyes: Right eye exhibits no discharge. Left conjunctiva is not injected.       Mild swelling noted of right upper/lower lid.   Cardiovascular: Normal rate and regular rhythm.   No murmur heard. Pulmonary/Chest: Effort normal and breath sounds normal. No respiratory distress. She has no wheezes. She has no rales. She exhibits no tenderness.  Skin:       + erythema surrounding the right eye.  No significant induration.  Slight erythema of the right cheek.    Psychiatric: She has a normal mood and affect. Her behavior is normal. Judgment and thought content normal.          Assessment & Plan:

## 2011-08-20 NOTE — Assessment & Plan Note (Signed)
She is now being followed by the coumadin clinic in [place] and tells me that she saw them yesterday. She was told that her INR was 2.3.  She is due for f/u with them on 10/22.  I have instructed her to call their office for a 1 week follow up appointment for f/u PT/INR due to addition of Doxycycline.  She is to contact me if she has any difficulty arranging this follow up.

## 2011-08-20 NOTE — Assessment & Plan Note (Signed)
Deteriorated on Keflex.  Will add Doxycycline to her regimen.  CT was performed today to further evaluate: notes mild preseptal periorbital cellulitis. No abscess or intraorbital involvement.  We discussed need to go to the ED immediately if increased pain, swelling, fever, or visual problems.  She verbalizes understanding.  Otherwise she is instructed to follow up tomorrow with Dr. Rodena Medin.  Case was reviewed with him today.

## 2011-08-20 NOTE — Telephone Encounter (Signed)
Called pt, reviewed CT results.  Instructed her to follow up tomorrow AM with Dr. Rodena Medin, go to ER if worsening overnight.

## 2011-08-20 NOTE — Telephone Encounter (Signed)
Please call the coumadin clinic in [place] where pt is being seen and let them know that we are adding doxycycline to her regimen and she will need a 1 week follow up apt for PT/INR.

## 2011-08-20 NOTE — Telephone Encounter (Signed)
Left message with Lequita Halt at [phone] to have Dr Irma Newness nurse return my call re: PT/INR in 1 week.

## 2011-08-20 NOTE — Patient Instructions (Signed)
Please complete your CT on the first floor this afternoon.  Follow up tomorrow with Dr. Rodena Medin.  Go to the ER if you developed increased pain, swelling, redness surrounding your right eye or any visual changes. Start Doxycycline.   Call the coumadin clinic and let them know that you are on antibiotics. Arrange a follow up appointment with them in 1 week for a follow up coumadin check due to the antibiotics that you are on.

## 2011-08-21 ENCOUNTER — Ambulatory Visit (HOSPITAL_BASED_OUTPATIENT_CLINIC_OR_DEPARTMENT_OTHER)
Admission: RE | Admit: 2011-08-21 | Discharge: 2011-08-21 | Disposition: A | Discharge status: Home / Self Care | Payer: Medicare Other | Source: Ambulatory Visit | Attending: Internal Medicine | Admitting: Internal Medicine

## 2011-08-21 ENCOUNTER — Encounter: Payer: Self-pay | Admitting: Internal Medicine

## 2011-08-21 ENCOUNTER — Ambulatory Visit (INDEPENDENT_AMBULATORY_CARE_PROVIDER_SITE_OTHER): Payer: Medicare Other | Admitting: Internal Medicine

## 2011-08-21 VITALS — BP 140/62 | HR 67 | Temp 98.6°F | Resp 16 | Ht 67.0 in | Wt 205.0 lb

## 2011-08-21 DIAGNOSIS — L0390 Cellulitis, unspecified: Secondary | ICD-10-CM

## 2011-08-21 DIAGNOSIS — Z1231 Encounter for screening mammogram for malignant neoplasm of breast: Secondary | ICD-10-CM | POA: Insufficient documentation

## 2011-08-21 NOTE — Telephone Encounter (Signed)
Received call from [place] stating pt has seen Domingo Cocking in the [facility]in Clinic. She will forward message to Myra at [phone].

## 2011-08-22 ENCOUNTER — Ambulatory Visit: Payer: Medicare Other

## 2011-08-22 ENCOUNTER — Ambulatory Visit (INDEPENDENT_AMBULATORY_CARE_PROVIDER_SITE_OTHER): Payer: Medicare Other | Admitting: Family

## 2011-08-22 ENCOUNTER — Encounter: Payer: Self-pay | Admitting: Family

## 2011-08-22 DIAGNOSIS — L932 Other local lupus erythematosus: Secondary | ICD-10-CM

## 2011-08-22 DIAGNOSIS — L930 Discoid lupus erythematosus: Secondary | ICD-10-CM

## 2011-08-22 DIAGNOSIS — L0390 Cellulitis, unspecified: Secondary | ICD-10-CM

## 2011-08-22 NOTE — Progress Notes (Signed)
Subjective:    Patient ID: [name], female    DOB: 03-22-1941, 70 y.o.   MRN: [id number]  HPI  [name] is a 70 yr old female who presents today for follow up of her facial cellulitis.  She underwent a CT on 10/3 to further evaluate the erythema around her eye and it was consistent with cellulitis (no abscess or intraorbital process was noted).  She is now on Keflex and Doxy.  She reports that the area is looking and feeling better.    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 At some point she was treated with methotrexate.  She later had an ANA drawn at [facility] which was negative.     Review of Systems See HPI  Past Medical History  Diagnosis Date  . Hyperplastic colon polyp   . Hypertension   . Diabetes mellitus type II   . Osteoarthritis   . GERD (gastroesophageal reflux disease)   . Osteopenia   . Lupus     of skin see dermatologist frequently  . 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 OA (osteoarthritis of spine)     C-spine  . DVT (deep venous thrombosis)     right leg DVT 05-17-2008  . Leg swelling     left leg 2-10, u/s showed a old clot, has a hypercoag panel,  . History of cardiovascular stress test     10/09 had CP, stress test neg, likely GI (GERD)-09/2006-had CP: stress test (-)  . 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 DVT (deep vein thrombosis) in pregnancy   . DVT of leg (deep venous thrombosis) 07/28/2011  . 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 Marital Status: Widowed    Spouse Name: N/A    Number of Children: 3  . Years of Education: N/A   Occupational History  . Retired    Social History Main Topics  . Smoking status: Never Smoker   . Smokeless tobacco: Never Used   . Alcohol Use: No  . Drug Use: No  . Sexually Active: Not Currently   Other Topics Concern  . Not on file   Social History Narrative   Single-widow 3 children , 1 in [place] (son, daughter - [place]) [place] to [place] 2002 from [place], [employer] Use - no   tobacco-- never  Illicit Drug Use - no Pt is Jehovah's Witness-No blood productsDaughter - [name]     Past Surgical History  Procedure Date  . Cholecystectomy   . Breast biopsy     left breast  . Breast lumpectomy     left breast-ductal carcinoma in situ  . Appendectomy   . Skin cancer excision     from back     Family History  Problem Relation Age of Onset  . Diabetes Mother   . Diabetes    . Heart attack Neg Hx   . Colon cancer Neg Hx   . Breast cancer Neg Hx     Allergies  Allergen Reactions  . Fluconazole Other (See Comments)    REACTION: severe fatigue and muscle weakness  . Metformin Diarrhea  . Pioglitazone Other (See Comments)    REACTION:  wt gain  . Vancomycin     Erythematous rash, hands and toes became cyanotic.      Current Outpatient Prescriptions on File Prior to Visit  Medication Sig Dispense Refill  . amLODipine (NORVASC) 10 MG tablet Take 10 mg by mouth.        . Calcium Carbonate-Vitamin D (CALCIUM-VITAMIN D) 600-200 MG-UNIT CAPS Take 1 tablet by mouth 2 (two) times daily.       . cephALEXin (KEFLEX) 500 MG capsule Take 1 capsule (500 mg total) by mouth 4 (four) times daily.  40 capsule  0  . Cholecalciferol (VITAMIN D3) 1000000 UNIT/GM LIQD Take 2,000 Units by mouth. 2,000 international untis (one drop) by mouth once daily at bedtime       . cyclobenzaprine (FLEXERIL) 5 MG tablet Take 5 mg by mouth 2 (two) times daily as needed. Muscle spasm       . doxycycline (VIBRA-TABS) 100 MG tablet Take 1 tablet (100 mg total) by mouth 2 (two) times daily.  20 tablet  0  . fesoterodine (TOVIAZ) 4 MG TB24 Take 1 tablet (4 mg total) by mouth daily.  28 tablet  0  . fish oil-omega-3 fatty acids 1000  MG capsule Take by mouth daily.       . furosemide (LASIX) 20 MG tablet Take 20 mg by mouth daily. Only as needed for swelling.      . Garlic 500 MG CAPS Take 500 mg by mouth 2 (two) times daily.        Marland Kitchen glucosamine-chondroitin 500-400 MG tablet Take 1 tablet by mouth 2 (two) times daily.        Marland Kitchen glucose blood (FREESTYLE LITE) test strip 1 each by Other route as needed. Use as instructed to check blood sugar four times daily (250.00)      . hydrochlorothiazide (,MICROZIDE/HYDRODIURIL,) 12.5 MG capsule TAKE 1 CAPSULE (12.5 MG TOTAL) BY MOUTH DAILY.  90 capsule  0  . insulin aspart (NOVOLOG) 100 UNIT/ML injection Inject 8-16 Units into the skin 3 (three) times daily.       . insulin glargine (LANTUS) 100 UNIT/ML injection Inject 40 Units into the skin 2 (two) times daily. 40 units in the am and 35 units at night      . Insulin Pen Needle (PEN NEEDLES 29GX1/2") 29G X MISC Use for lantus solostar pen.  100 each  0  . Lactobacillus (ACIDOPHILUS PO) Take 1 tablet by mouth daily.       Marland Kitchen lisinopril (PRINIVIL,ZESTRIL) 40 MG tablet TAKE 1 TABLET (40 MG TOTAL) BY MOUTH DAILY.  90 tablet  1  . niacin 500 MG tablet Take 500 mg by mouth daily with breakfast.        . silver sulfADIAZINE (SILVADENE) 1 % cream Apply topically daily.  25 g  0  . warfarin (COUMADIN) 5 MG tablet Take 1 tablet (5 mg total) by mouth daily.  30 tablet  0  . zoster vaccine live, PF, (ZOSTAVAX) 14782 UNT/0.65ML injection Inject 19,400 Units into the skin once.  1 vial  0    BP 140/70  Pulse 72  Temp(Src) 97.8 F (36.6 C) (Oral)  Resp 16  Wt 206 lb (93.441 kg)       Objective:   Physical Exam  Constitutional: She appears well-developed and well-nourished.  Cardiovascular: Normal rate and regular rhythm.   Pulmonary/Chest: Effort normal and breath sounds normal.  Musculoskeletal: She exhibits no edema.  Skin:       Erythema surrounding  the right eye is very mild at this point and improved. No significant associated  warmth is noted.            Assessment & Plan:

## 2011-08-22 NOTE — Assessment & Plan Note (Signed)
Per my review of the patient's record she has seen dermatology but has never had a formal rheumatological consultation.  I discussed this with the patient and she is agreeable to proceed with referral.

## 2011-08-22 NOTE — Patient Instructions (Signed)
Complete your Keflex. Continue your Doxycycline.  Follow up on Monday.  You will be contacted about your referral to Rheumatology.

## 2011-08-22 NOTE — Telephone Encounter (Signed)
Attempted to reach Domingo Cocking at [facility] but she is out of office today. Left message with Gigi Gin to have Larita Fife return my call as she is covering for [employer].

## 2011-08-22 NOTE — Assessment & Plan Note (Signed)
This is improved. I asked her to continue the keflex until gone. Continue Doxycycline bid and follow up early next week. She is agreeable to go to the ED over the weekend if symptoms deteriorate.

## 2011-08-24 NOTE — Progress Notes (Signed)
  Subjective:    Patient ID: [name], female    DOB: 08/05/1941, 70 y.o.   MRN: [id number]  HPI  Pt presents to clinic for close followup of facial cellulitis. Seen yesterday with right periorbital involvement of facial cellulitis. abx coverage expanded and facial ct performed. Results reviewed with pt showing no evidence of orbital involvement or abscess. Skin erythema is stable without worsening. No f/c. Tolerating abx tx without adverse effects. No other complaints.  Past Medical History  Diagnosis Date  . Hyperplastic colon polyp   . Hypertension   . Diabetes mellitus type II   . Osteoarthritis   . GERD (gastroesophageal reflux disease)   . Osteopenia   . Lupus     of skin see dermatologist frequently  . 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 OA (osteoarthritis of spine)     C-spine  . DVT (deep venous thrombosis)     right leg DVT 05-17-2008  . Leg swelling     left leg 2-10, u/s showed a old clot, has a hypercoag panel,  . History of cardiovascular stress test     10/09 had CP, stress test neg, likely GI (GERD)-09/2006-had CP: stress test (-)  . 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 DVT (deep vein thrombosis) in pregnancy   . DVT of leg (deep venous thrombosis) 07/28/2011  . 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 Cholecystectomy   . Breast biopsy     left breast  . Breast lumpectomy     left breast-ductal carcinoma in situ  . Appendectomy   . Skin cancer excision     from back     reports that she has never smoked. She has never used smokeless tobacco. She reports that she does not drink alcohol or use illicit drugs. family history includes Diabetes in her mother and unspecified family member.  There is no history of Heart attack, and Colon cancer, and Breast cancer, . Allergies  Allergen Reactions  . Fluconazole Other (See Comments)   REACTION: severe fatigue and muscle weakness  . Metformin Diarrhea  . Pioglitazone Other (See Comments)    REACTION: wt gain  . Vancomycin     Erythematous rash, hands and toes became cyanotic.         Review of Systems see hpi     Objective:   Physical Exam  Nursing note and vitals reviewed. Constitutional: She appears well-developed and well-nourished. No distress.  HENT:  Head: Normocephalic and atraumatic.  Right Ear: External ear normal.  Left Ear: External ear normal.  Eyes: Conjunctivae and EOM are normal. Pupils are equal, round, and reactive to light. No scleral icterus.  Skin: Skin is warm. She is not diaphoretic. There is erythema.       Stable right periorbital erythema and mild induration when compared with yesterday's exam. No ocular involvement and eom is intact. No wound or drainage.          Assessment & Plan:

## 2011-08-24 NOTE — Assessment & Plan Note (Signed)
Stable s/p abx change ~24 hours prior. Facial ct without deep tissue finding. Continue current abx tx and schedule close followup tomorrow

## 2011-08-25 ENCOUNTER — Ambulatory Visit (INDEPENDENT_AMBULATORY_CARE_PROVIDER_SITE_OTHER): Payer: Medicare Other | Admitting: Family

## 2011-08-25 ENCOUNTER — Telehealth: Payer: Self-pay | Admitting: Family

## 2011-08-25 ENCOUNTER — Telehealth: Payer: Self-pay | Admitting: *Deleted

## 2011-08-25 ENCOUNTER — Encounter: Payer: Self-pay | Admitting: Family

## 2011-08-25 VITALS — BP 150/62 | HR 60 | Temp 98.1°F | Resp 16 | Ht 67.0 in | Wt 206.0 lb

## 2011-08-25 DIAGNOSIS — M545 Low back pain: Secondary | ICD-10-CM

## 2011-08-25 DIAGNOSIS — L0291 Cutaneous abscess, unspecified: Secondary | ICD-10-CM

## 2011-08-25 DIAGNOSIS — L0390 Cellulitis, unspecified: Secondary | ICD-10-CM

## 2011-08-25 DIAGNOSIS — I2699 Other pulmonary embolism without acute cor pulmonale: Secondary | ICD-10-CM

## 2011-08-25 NOTE — Telephone Encounter (Signed)
Received call from [facility] in Imaging stating pt had mammogram on 08/21/11 and was ordered by Dr Artist Pais in 06/2011. States it may take up to 10 days to get the final report.

## 2011-08-25 NOTE — Assessment & Plan Note (Signed)
At this point, she is not a candidate for NSAIDS while on coumadin and certainly would not be a good candidate for any invasive procedure at this time. I recommended that she use tylenol PRN and start PT.  She would like to hold off of PT at this time and will let me know if she changes her mind.

## 2011-08-25 NOTE — Patient Instructions (Addendum)
Continue doxycycline for 2 more days then stop. Follow up in 2 months, sooner if problems or concerns.

## 2011-08-25 NOTE — Telephone Encounter (Signed)
Pt request call with mammogram results. States mammogram was completed last week. Left message with Eber Jones in Imaging to call with status of results.

## 2011-08-25 NOTE — Telephone Encounter (Signed)
Pt notified. Notified [name] at the [facility] [phone].

## 2011-08-25 NOTE — Telephone Encounter (Signed)
Spoke to [place]ell at [facility] in [facility] and verified that pt has PT/INR appt this Friday, 10/12. Per Sandford Craze, NP she will have pt obtain PT/INR today as well. Order was given to pt.

## 2011-08-25 NOTE — Assessment & Plan Note (Signed)
Check PT/INR today as she is on abx.  Oxygen saturation looks great.   Await Hematology consultation/recommendations.

## 2011-08-25 NOTE — Assessment & Plan Note (Signed)
Clinically resolved.  She is nearly done with keflex.  I have advised her to complete keflex, continue doxy x 4 more doses then stop. She verbalizes understanding.

## 2011-08-25 NOTE — Progress Notes (Signed)
Subjective:    Patient ID: [name], female    DOB: Apr 14, 1941, 70 y.o.   MRN: [id number]  HPI  Low back pain- notes that this is worst when she is sleeping. Tells me that she has been told in the past that she has "bone spurs" in my spine and arthritis. Denies radiation of pain to the lower extremities or lower extremity weakness.    PE- she is scheduled to see coumadin clinic on 10/12. Notes that she continues to be somewhat SOB with exertion.    Cellulitis- continues Keflex and doxy. Notes that the redness surrounding her eye has resolved. Denies, pain, swelling or fever over the weekend.    Review of Systems See HPI  Past Medical History  Diagnosis Date  . Hyperplastic colon polyp   . Hypertension   . Diabetes mellitus type II   . Osteoarthritis   . GERD (gastroesophageal reflux disease)   . Osteopenia   . Lupus     of skin see dermatologist frequently  . 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 OA (osteoarthritis of spine)     C-spine  . DVT (deep venous thrombosis)     right leg DVT 05-17-2008  . Leg swelling     left leg 2-10, u/s showed a old clot, has a hypercoag panel,  . History of cardiovascular stress test     10/09 had CP, stress test neg, likely GI (GERD)-09/2006-had CP: stress test (-)  . 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 DVT (deep vein thrombosis) in pregnancy   . DVT of leg (deep venous thrombosis) 07/28/2011  . 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 Marital Status: Widowed    Spouse Name: N/A    Number of Children: 3  . Years of Education: N/A   Occupational History  . Retired    Social History Main Topics  . Smoking status: Never Smoker   . Smokeless tobacco: Never Used  . Alcohol Use: No  . Drug Use: No  . Sexually Active: Not Currently   Other Topics Concern  . Not on file   Social History Narrative   Single-widow 3  children , 1 in [place] (son, daughter - [place]) [place] to [place] 2002 from [place], [employer] Use - no   tobacco-- never  Illicit Drug Use - no Pt is Jehovah's Witness-No blood productsDaughter - [name]     Past Surgical History  Procedure Date  . Cholecystectomy   . Breast biopsy     left breast  . Breast lumpectomy     left breast-ductal carcinoma in situ  . Appendectomy   . Skin cancer excision     from back     Family History  Problem Relation Age of Onset  . Diabetes Mother   . Diabetes    . Heart attack Neg Hx   . Colon cancer Neg Hx   . Breast cancer Neg Hx     Allergies  Allergen Reactions  . Fluconazole Other (See Comments)    REACTION: severe fatigue and muscle weakness  . Metformin Diarrhea  . Pioglitazone Other (See Comments)    REACTION: wt gain  . Vancomycin     Erythematous rash, hands and toes became cyanotic.      Current Outpatient Prescriptions on File Prior to Visit  Medication Sig  Dispense Refill  . amLODipine (NORVASC) 10 MG tablet Take 10 mg by mouth.        . Calcium Carbonate-Vitamin D (CALCIUM-VITAMIN D) 600-200 MG-UNIT CAPS Take 1 tablet by mouth 2 (two) times daily.       . cephALEXin (KEFLEX) 500 MG capsule Take 1 capsule (500 mg total) by mouth 4 (four) times daily.  40 capsule  0  . Cholecalciferol (VITAMIN D3) 1000000 UNIT/GM LIQD Take 2,000 Units by mouth. 2,000 international untis (one drop) by mouth once daily at bedtime       . cyclobenzaprine (FLEXERIL) 5 MG tablet Take 5 mg by mouth 2 (two) times daily as needed. Muscle spasm       . doxycycline (VIBRA-TABS) 100 MG tablet Take 1 tablet (100 mg total) by mouth 2 (two) times daily.  20 tablet  0  . fesoterodine (TOVIAZ) 4 MG TB24 Take 1 tablet (4 mg total) by mouth daily.  28 tablet  0  . fish oil-omega-3 fatty acids 1000 MG capsule Take by mouth daily.       . furosemide (LASIX) 20 MG tablet Take 20 mg by mouth daily. Only as needed for swelling.      . Garlic 500 MG CAPS Take  500 mg by mouth 2 (two) times daily.        Marland Kitchen glucosamine-chondroitin 500-400 MG tablet Take 1 tablet by mouth 2 (two) times daily.        Marland Kitchen glucose blood (FREESTYLE LITE) test strip 1 each by Other route as needed. Use as instructed to check blood sugar four times daily (250.00)      . hydrochlorothiazide (,MICROZIDE/HYDRODIURIL,) 12.5 MG capsule TAKE 1 CAPSULE (12.5 MG TOTAL) BY MOUTH DAILY.  90 capsule  0  . insulin aspart (NOVOLOG) 100 UNIT/ML injection Inject 8-16 Units into the skin 3 (three) times daily.       . insulin glargine (LANTUS) 100 UNIT/ML injection Inject 40 Units into the skin 2 (two) times daily. 40 units in the am and 35 units at night      . Insulin Pen Needle (PEN NEEDLES 29GX1/2") 29G X MISC Use for lantus solostar pen.  100 each  0  . Lactobacillus (ACIDOPHILUS PO) Take 1 tablet by mouth daily.       Marland Kitchen lisinopril (PRINIVIL,ZESTRIL) 40 MG tablet TAKE 1 TABLET (40 MG TOTAL) BY MOUTH DAILY.  90 tablet  1  . niacin 500 MG tablet Take 500 mg by mouth daily with breakfast.        . silver sulfADIAZINE (SILVADENE) 1 % cream Apply topically daily.  25 g  0  . warfarin (COUMADIN) 5 MG tablet Take 1 tablet (5 mg total) by mouth daily.  30 tablet  0  . zoster vaccine live, PF, (ZOSTAVAX) 13086 UNT/0.65ML injection Inject 19,400 Units into the skin once.  1 vial  0    BP 150/62  Pulse 60  Temp(Src) 98.1 F (36.7 C) (Oral)  Resp 16  Ht 5\' 7"  (1.702 m)  Wt 206 lb (93.441 kg)  BMI 32.26 kg/m2  SpO2 100%       Objective:   Physical Exam  Constitutional: She appears well-developed and well-nourished.  HENT:  Head: Normocephalic and atraumatic.  Cardiovascular: Normal rate and regular rhythm.   No murmur heard. Pulmonary/Chest: Effort normal and breath sounds normal. No respiratory distress. She has no wheezes. She has no rales. She exhibits no tenderness.  Skin: Skin is warm and dry. No rash noted.  No erythema.       Complete resolution of the redness, swelling right  side of face.           Assessment & Plan:

## 2011-08-25 NOTE — Telephone Encounter (Signed)
Pls call pt and let her know that her INR is 2.17 which is good. She should continue her warfarin 5mg  once daily and follow up with coumadin clinic on Friday as scheduled.

## 2011-09-01 ENCOUNTER — Telehealth: Payer: Self-pay | Admitting: Family

## 2011-09-01 ENCOUNTER — Telehealth: Payer: Self-pay | Admitting: *Deleted

## 2011-09-01 MED ORDER — INSULIN GLARGINE 100 UNIT/ML ~~LOC~~ SOLN: 40.0000 [IU] | Freq: Two times a day (BID) | SUBCUTANEOUS | Status: DC

## 2011-09-01 NOTE — Telephone Encounter (Signed)
Per [name], mammogram is complete and she will fax [place] a copy.

## 2011-09-01 NOTE — Telephone Encounter (Signed)
Please advise 

## 2011-09-01 NOTE — Telephone Encounter (Signed)
Received call from pt stating she forgot to take her Warfarin last night. Per Sandford Craze, NP advised pt to take last night's dose this morning and take her scheduled dose this evening. Pt voices understanding.

## 2011-09-01 NOTE — Telephone Encounter (Signed)
Pt notified that rx is ready for pick up. Pt requests sample of Lantus. Advised pt samples (2pens) will be refrigerator when she picks up Rx. Pt verified that she currently takes Lanuts 40 units in the morning and 35 units in the evening. Directions corrected on med list.

## 2011-09-01 NOTE — Telephone Encounter (Signed)
SHE HAD AN RX FROM DR Artist Pais FOR STOCKINGS FOR PEOPLE WHO HAVE HAD BLOOD CLOTS.  IT WAS DATED 5-4.  SHE IS GOING TO [place] TO THE FACTORY AND NEEDS A NEW RX FOR THE STOCKINGS TO TAKE WITH HER.

## 2011-09-01 NOTE — Telephone Encounter (Signed)
Result received and forwarded to Provider for review. 

## 2011-09-01 NOTE — Telephone Encounter (Signed)
Rx written for bilateral thigh high compression stockings.  Diagnosis DVT #1 pair.

## 2011-09-02 ENCOUNTER — Ambulatory Visit: Payer: Medicare Other | Admitting: Hematology & Oncology

## 2011-09-16 ENCOUNTER — Ambulatory Visit: Payer: Medicare Other | Admitting: Family

## 2011-10-13 ENCOUNTER — Ambulatory Visit: Payer: Medicare Other | Admitting: Family

## 2011-10-13 ENCOUNTER — Encounter: Payer: Self-pay | Admitting: Family

## 2011-10-13 ENCOUNTER — Ambulatory Visit (HOSPITAL_BASED_OUTPATIENT_CLINIC_OR_DEPARTMENT_OTHER): Payer: Medicare Other | Admitting: Hematology & Oncology

## 2011-10-13 ENCOUNTER — Ambulatory Visit: Payer: Medicare Other

## 2011-10-13 ENCOUNTER — Other Ambulatory Visit: Payer: Medicare Other | Admitting: Lab

## 2011-10-13 DIAGNOSIS — I2699 Other pulmonary embolism without acute cor pulmonale: Secondary | ICD-10-CM

## 2011-10-13 DIAGNOSIS — Z87898 Personal history of other specified conditions: Secondary | ICD-10-CM

## 2011-10-13 DIAGNOSIS — M7989 Other specified soft tissue disorders: Secondary | ICD-10-CM

## 2011-10-13 DIAGNOSIS — Z86718 Personal history of other venous thrombosis and embolism: Secondary | ICD-10-CM

## 2011-10-13 DIAGNOSIS — I82409 Acute embolism and thrombosis of unspecified deep veins of unspecified lower extremity: Secondary | ICD-10-CM

## 2011-10-13 DIAGNOSIS — E119 Type 2 diabetes mellitus without complications: Secondary | ICD-10-CM

## 2011-10-13 NOTE — Progress Notes (Signed)
This office note has been dictated.

## 2011-10-13 NOTE — Progress Notes (Signed)
CC:   Sandford Craze, NP  DIAGNOSES: 1. Recurrent deep venous thrombosis/pulmonary embolus. 2. History of early stage ductal carcinoma of the left breast 2003. 3. History of melanoma in situ.  HISTORY OF PRESENT ILLNESS:  [name] is a very nice 70 year old [place] female.  She has been in the [place] for 50 years.  She came over in 1960.  Her husband was a GI.  She was diagnosed with a early stage breast cancer of the left breast back in 2003.  She really cannot give too much in the way of history. She is not sure who the surgeon is.  Going through old records at [facility], she had a biopsy back in November 2003.  The pathology report ([id number]) showed atypical ductal hyperplasia.  She then was seen by Dr. Francina Ames in January 2004.  He did an incisional biopsy.  The pathology report ([id number]) showed ductal carcinoma in situ.  She did undergo radiation therapy.  Again, she is not too sure who her radiation oncologist is.  Going through the record, it looks like she was seen by Dr. Kathrynn Running.  She completed radiation back in April 2004. She received 6000 rads.  She had a history of DVT in the legs.  Again, she really could not give a lot of information regarding this.  Again, going through old hospital records, she had DVT of the right leg back in July of 2009.  She was on Coumadin until November 2009.  She then had a DVT of the left leg.  This was in February 2010.  She was placed on anticoagulation at that time.  She is not sure how long she was on anticoagulation.  She was admitted to the hospital back in July of this year with Clostridium difficile.  She was treated for this.  She was found to have shortness of breath in September.  She underwent a CT angiogram of the chest.  This was done on September 10th.  This showed bulky acute pulmonary emboli bilaterally.  She had more emboli in the left pulmonary vasculature than the right.  She currently  is on Coumadin.  Sandford Craze, NP of [facility] felt that a Hematology/Oncology opinion was indicated.  I decided to help with management with respect to the "breast cancer" and her hypercoagulable condition.  I am sure she has had hypercoagulable studies done in the past.  From what I can tell, all the tests have been negative.  She is worried about the swelling in her legs.  She wants to have another Doppler test done of her legs.  She also wants to have a CT scan done of her chest.  She has occasional chest discomfort.  This seems to be more musculoskeletal.  She is not coughing up blood.  She has some dyspnea with exertion, but this appears to be more chronic than anything else.  There has been no change in bowel or bladder habits.  She is very religiously getting her mammograms.  PAST MEDICAL HISTORY: 1. Insulin-dependent diabetes. 2. Recurrent DVT/PE. 3. Carcinoma in situ of the left breast. 4. Melanoma in situ of the back. 5. Lupus. 6. Hypertension. 7. Osteoarthritis.  ALLERGIES: 1. FLUCONAZOLE. 2. METFORMIN. 3. ACTOS. 4. ERYTHROMYCIN.  MEDICATIONS: 1. Norvasc 10 mg p.o. daily. 2. Flexeril 5 mg p.o. b.i.d. p.r.n. 3. Toviaz 4 mg p.o. daily. 4. Lasix 20 mg p.o. daily. 5. Hydrochlorothiazide 12.5 mg daily p.r.n. 6. NovoLog insulin 8-16 units subcu t.i.d. p.r.n. 7. Lantus insulin  40 units subcu nightly. 8. Zestril 40 mg p.o. daily. 9. Coumadin 5 mg p.o. daily.  SOCIAL HISTORY:  Negative for tobacco use.  She has no alcohol use. There is no obvious occupational exposure.  She has 3 children who are all healthy.  Her husband passed away several years ago.  She is a Scientist, product/process development.  FAMILY HISTORY:  Noncontributory.  There is no history of blood clots in the family.  REVIEW OF SYSTEMS:  As stated in the history of present illness.  PHYSICAL EXAM:  General:  This is a well-developed well-nourished white female in no obvious distress.  Vital  Signs:  Temperature of 98.1, pulse 70, respiratory rate 18, blood pressure 160/63.  Weight is 204. Head/Neck:  Normocephalic, atraumatic skull.  There are no ocular or oral lesions.  No palpable cervical or supraclavicular lymph nodes. Lungs are clear to percussion and auscultation bilaterally.  Cardiac: Regular rate and rhythm with a normal S1, S2.  No murmurs, rubs or bruits.  Abdomen:  Soft with good bowel sounds.  There is no palpable abdominal mass.  There is no fluid wave.  There is no palpable hepatosplenomegaly.  Back:  Exam shows no kyphosis or osteoporotic changes.  There is no tenderness over the spine, ribs, or hips. Extremities:  Some 1+ nonpitting edema of the lower legs.  She has some stasis dermatitis changes in the lower legs.  Neurologic:  Exam shows no focal neurological deficits.  IMPRESSION/PLAN:  [name] is a very nice 70 year old white female with a past history of known carcinoma situ of the left breast.  This was treated with surgery and radiation.  This was almost 10 years ago. I suppose she could be seen yearly for this.  I think the fact that she has yearly mammograms is probably the best thing for her.  As far as her hypercoagulable situation goes, she certainly may be hypercoagulable.  She does have risk factors, mostly that being diabetes.  I would not put her through a hypercoagulable workup.  This would not change how she is managed.  She clearly needs lifelong anticoagulation.  I believe that her INR should be kept around 2.  We will go ahead and order a Doppler of her legs.  It would not surprise me if she has residual thrombus.  I told her that the swelling is going to be permanent.  I think it is a combination of the thromboembolic event and also to some degree her diabetes.  She may have some skin changes related to her diabetes.  She is upset over this because she "wants to find a husband," and she is worried that men will be turned off  by her legs.  I tried to reassure regarding this.  I encouraged her to use her compression stockings, which she has.  I think will help her legs more than anything else.  I told her that there is no indication for a routine CT angiogram of her chest.  I just do not see that insurance would really cover this as she really does not have any symptoms that would suggest progressive or new thromboembolic issues.  We certainly can get her back in 1 year for followup.  She can be followed up by Harriett Sine, our nurse practitioner, for surveillance.  TIME SPENT:  We spent over and hour and 15 minutes with Ms. Fulfer. She is very nice and charming.  She is [place] in ancestry, as am I.  We talked about that.  ______________________________ Josph Macho, M.D. PRE/MEDQ  D:  10/13/2011  T:  10/13/2011  Job:  [id number]

## 2011-10-13 NOTE — Progress Notes (Signed)
Seen as a new pt with Dr. Myna Hidalgo. Progress note and charges will be entered by Dr. Myna Hidalgo.

## 2011-10-14 ENCOUNTER — Telehealth: Payer: Self-pay | Admitting: *Deleted

## 2011-10-14 NOTE — Telephone Encounter (Signed)
Pt aware of 10-27-11 1030 am doppler and that I have mailed her May schedule

## 2011-10-20 ENCOUNTER — Ambulatory Visit (INDEPENDENT_AMBULATORY_CARE_PROVIDER_SITE_OTHER): Payer: Medicare Other | Admitting: Family

## 2011-10-20 ENCOUNTER — Encounter: Payer: Self-pay | Admitting: Family

## 2011-10-20 DIAGNOSIS — I2699 Other pulmonary embolism without acute cor pulmonale: Secondary | ICD-10-CM

## 2011-10-20 DIAGNOSIS — I10 Essential (primary) hypertension: Secondary | ICD-10-CM

## 2011-10-20 DIAGNOSIS — L930 Discoid lupus erythematosus: Secondary | ICD-10-CM

## 2011-10-20 DIAGNOSIS — W19XXXA Unspecified fall, initial encounter: Secondary | ICD-10-CM | POA: Insufficient documentation

## 2011-10-20 DIAGNOSIS — E559 Vitamin D deficiency, unspecified: Secondary | ICD-10-CM

## 2011-10-20 DIAGNOSIS — M545 Low back pain: Secondary | ICD-10-CM

## 2011-10-20 DIAGNOSIS — L932 Other local lupus erythematosus: Secondary | ICD-10-CM

## 2011-10-20 MED ORDER — TRAMADOL HCL 50 MG PO TABS: 50.0000 mg | ORAL_TABLET | Freq: Four times a day (QID) | ORAL | Status: AC | PRN

## 2011-10-20 NOTE — Assessment & Plan Note (Signed)
This is being managed by Dr. Karin Golden.

## 2011-10-20 NOTE — Assessment & Plan Note (Signed)
Pt was evaluated by Dr. Cardell Peach 11/12.  Had neg ANA, felt very unlikely that she has SLE and no follow up necessary.

## 2011-10-20 NOTE — Assessment & Plan Note (Signed)
Unchanged, she is aware to avoid NSAIDS.  Rx given for tramadol prn breakthrough pain not relieved by tylenol.

## 2011-10-20 NOTE — Assessment & Plan Note (Signed)
She is on coumadin which is being managed by coumadin clinic.

## 2011-10-20 NOTE — Assessment & Plan Note (Signed)
No clinical sign of fracture and she had no LOC.  Monitor.

## 2011-10-20 NOTE — Patient Instructions (Addendum)
Call if the swelling in your mouth if the area is not improved in 1-2 weeks.  Complete your lab work prior to leaving Follow up in 3 months.

## 2011-10-20 NOTE — Assessment & Plan Note (Addendum)
BP Readings from Last 3 Encounters:  10/20/11 142/60  10/13/11 160/63  08/25/11 150/62  BP control is reasonable today on lisinopril and HCTZ. Continue same.

## 2011-10-20 NOTE — Progress Notes (Signed)
Subjective:    Patient ID: [name], female    DOB: [birth year], 70 y.o.   MRN: [id number]  HPI  Ms.  Wessling is a 70 yr old female who presents today for follow up.    SLE- she met with Dr. Cardell Peach.  She was told that it was unlikely that she has SLE.  Reports that she fell in her yard by accident last week.  She reports some soreness on the right side .  Some pain with bending and bruising of the left great toe.  No problems with ambulation.    DM2- she reports that her sugar has been elevated, She is to follow up with Dr. Allena Katz in January.    HTN-  On lisinopril, daily lasix (did not take this AM)  PE-  She is followed at the Coumadin Clinic at [facility]. She reports that her coumadin was recently increased.    GERD- notes that salsa bothers her.    Low back pain- unchanged.  Tylenol is not helping.  Review of Systems See HPI  Past Medical History  Diagnosis Date  . Hyperplastic colon polyp   . Hypertension   . Diabetes mellitus type II   . Osteoarthritis   . GERD (gastroesophageal reflux disease)   . Osteopenia   . Lupus     of skin see dermatologist frequently  . 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 OA (osteoarthritis of spine)     C-spine  . DVT (deep venous thrombosis)     right leg DVT 05-17-2008  . Leg swelling     left leg 2-10, u/s showed a old clot, has a hypercoag panel,  . History of cardiovascular stress test     10/09 had CP, stress test neg, likely GI (GERD)-09/2006-had CP: stress test (-)  . 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 DVT (deep vein thrombosis) in pregnancy   . DVT of leg (deep venous thrombosis) 07/28/2011  . 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 Marital Status: Widowed    Spouse Name: N/A    Number of Children: 3  . Years of Education: N/A   Occupational History  . Retired    Social History Main Topics  .  Smoking status: Never Smoker   . Smokeless tobacco: Never Used  . Alcohol Use: No  . Drug Use: No  . Sexually Active: Not Currently   Other Topics Concern  . Not on file   Social History Narrative   Single-widow 3 children , 1 in [place] (son, daughter - [place]) [place] to [place] 2002 from [place], [employer] Use - no   tobacco-- never  Illicit Drug Use - no Pt is Jehovah's Witness-No blood productsDaughter - [name]     Past Surgical History  Procedure Date  . Cholecystectomy   . Breast biopsy     left breast  . Breast lumpectomy     left breast-ductal carcinoma in situ  . Appendectomy   . Skin cancer excision     from back     Family History  Problem Relation Age of Onset  . Diabetes Mother   . Diabetes    . Heart attack Neg Hx   . Colon cancer Neg Hx   . Breast cancer Neg Hx     Allergies  Allergen Reactions  . Fluconazole  Other (See Comments)    REACTION: severe fatigue and muscle weakness  . Metformin Diarrhea  . Pioglitazone Other (See Comments)    REACTION: wt gain  . Vancomycin     Erythematous rash, hands and toes became cyanotic.      Current Outpatient Prescriptions on File Prior to Visit  Medication Sig Dispense Refill  . Calcium Carbonate-Vitamin D (CALCIUM-VITAMIN D) 600-200 MG-UNIT CAPS Take 1 tablet by mouth 2 (two) times daily.       . Cholecalciferol (VITAMIN D3) 1000000 UNIT/GM LIQD Take 2,000 Units by mouth. 2,000 international untis (one drop) by mouth once daily at bedtime       . cyclobenzaprine (FLEXERIL) 5 MG tablet Take 5 mg by mouth 2 (two) times daily as needed. Muscle spasm       . fesoterodine (TOVIAZ) 4 MG TB24 Take 1 tablet (4 mg total) by mouth daily.  28 tablet  0  . fish oil-omega-3 fatty acids 1000 MG capsule Take by mouth daily.       . furosemide (LASIX) 20 MG tablet Take 20 mg by mouth daily. Only as needed for swelling.      . Garlic 500 MG CAPS Take 500 mg by mouth 2 (two) times daily.        Marland Kitchen glucosamine-chondroitin  500-400 MG tablet Take 1 tablet by mouth 2 (two) times daily.       Marland Kitchen glucose blood (FREESTYLE LITE) test strip 1 each by Other route as needed. Use as instructed to check blood sugar four times daily (250.00)      . insulin aspart (NOVOLOG) 100 UNIT/ML injection Inject 8-16 Units into the skin 3 (three) times daily.       . insulin glargine (LANTUS) 100 UNIT/ML injection Inject 40 Units into the skin at bedtime.       . Insulin Pen Needle (PEN NEEDLES 29GX1/2") 29G X MISC Use for lantus solostar pen.  100 each  0  . Lactobacillus (ACIDOPHILUS PO) Take 1 tablet by mouth daily.       Marland Kitchen lisinopril (PRINIVIL,ZESTRIL) 40 MG tablet TAKE 1 TABLET (40 MG TOTAL) BY MOUTH DAILY.  90 tablet  1  . niacin 500 MG tablet Take 500 mg by mouth 2 (two) times daily.       . silver sulfADIAZINE (SILVADENE) 1 % cream Apply topically daily.  25 g  0  . warfarin (COUMADIN) 5 MG tablet Take 1 tablet (5 mg total) by mouth daily.  30 tablet  0  . amLODipine (NORVASC) 10 MG tablet Take 10 mg by mouth.        . hydrochlorothiazide (,MICROZIDE/HYDRODIURIL,) 12.5 MG capsule TAKE 1 CAPSULE (12.5 MG TOTAL) BY MOUTH DAILY.  90 capsule  0    BP 142/60  Pulse 78  Temp(Src) 98 F (36.7 C) (Oral)  Resp 18  Wt 204 lb (92.534 kg)  SpO2 100%       Objective:   Physical Exam  Constitutional: She appears well-developed and well-nourished.  Cardiovascular: Normal rate and regular rhythm.   No murmur heard. Pulmonary/Chest: Effort normal and breath sounds normal. No respiratory distress. She has no wheezes. She has no rales. She exhibits no tenderness.  Musculoskeletal:       Trace bilateral LE edema. Steady even gait. Right low back/hip is tender to palpation (since fall) Mild bruising noted of anterior left great toe.   Skin: Skin is warm and dry.  Psychiatric: She has a normal mood and affect. Her behavior  is normal. Judgment and thought content normal.          Assessment & Plan:

## 2011-10-21 ENCOUNTER — Encounter: Payer: Self-pay | Admitting: Family

## 2011-10-21 LAB — BASIC METABOLIC PANEL
CO2: 25 mEq/L (ref 19–32)
Calcium: 9.4 mg/dL (ref 8.4–10.5)
Creat: 0.73 mg/dL (ref 0.50–1.10)
Glucose, Bld: 163 mg/dL — ABNORMAL HIGH (ref 70–99)
Sodium: 140 mEq/L (ref 135–145)

## 2011-10-22 ENCOUNTER — Telehealth: Payer: Self-pay | Admitting: *Deleted

## 2011-10-22 NOTE — Telephone Encounter (Signed)
Message copied by Kathi Simpers on Wed Oct 22, 2011  8:23 AM ------      Message from: [name], MELISSA      Created: Mon Oct 20, 2011 10:08 AM       Could you pls call Dr. Dillard Essex office and request referral notes.

## 2011-10-22 NOTE — Telephone Encounter (Signed)
Message copied by Kathi Simpers on Wed Oct 22, 2011  4:10 PM ------      Message from: [name], MELISSA      Created: Mon Oct 20, 2011 10:08 AM       Could you pls call Dr. Dillard Essex office and request referral notes.

## 2011-10-22 NOTE — Telephone Encounter (Signed)
Requested records from [facility] at [phone].

## 2011-10-23 NOTE — Telephone Encounter (Signed)
Records received and forwarded to Provider for review. 

## 2011-10-27 ENCOUNTER — Ambulatory Visit (HOSPITAL_BASED_OUTPATIENT_CLINIC_OR_DEPARTMENT_OTHER)
Admission: RE | Admit: 2011-10-27 | Discharge: 2011-10-27 | Disposition: A | Discharge status: Home / Self Care | Payer: Medicare Other | Source: Ambulatory Visit | Attending: Hematology & Oncology | Admitting: Hematology & Oncology

## 2011-10-27 DIAGNOSIS — I82409 Acute embolism and thrombosis of unspecified deep veins of unspecified lower extremity: Secondary | ICD-10-CM

## 2011-10-27 DIAGNOSIS — M79609 Pain in unspecified limb: Secondary | ICD-10-CM

## 2011-10-27 DIAGNOSIS — I2699 Other pulmonary embolism without acute cor pulmonale: Secondary | ICD-10-CM

## 2011-10-27 DIAGNOSIS — Z7901 Long term (current) use of anticoagulants: Secondary | ICD-10-CM | POA: Insufficient documentation

## 2011-10-27 DIAGNOSIS — Z86718 Personal history of other venous thrombosis and embolism: Secondary | ICD-10-CM

## 2011-10-27 DIAGNOSIS — M7989 Other specified soft tissue disorders: Secondary | ICD-10-CM | POA: Insufficient documentation

## 2011-11-04 ENCOUNTER — Telehealth: Payer: Self-pay | Admitting: *Deleted

## 2011-11-04 NOTE — Telephone Encounter (Signed)
Left detailed message on voicemail and to call if any questions. 

## 2011-11-04 NOTE — Telephone Encounter (Signed)
She should avoid ibuprofen/aleve due to risk of bleeding on coumadin.  She can try tramadol.  Looks like a refill was sent to her pharmacy a few days ago. Thanks.

## 2011-11-04 NOTE — Telephone Encounter (Signed)
Pt called stating tylenol is not helping her back pain. Wants to know if she can take OTC ibuprofen or Aleve since she is also on Coumadin? Wants to know what else she can try? She had been using Lidoderm patches but states they are too expensive for her to afford and we do not currently have any samples. Please advise.

## 2011-11-30 ENCOUNTER — Other Ambulatory Visit: Payer: Self-pay | Admitting: Family Medicine

## 2011-12-05 ENCOUNTER — Other Ambulatory Visit: Payer: Self-pay | Admitting: Internal Medicine

## 2011-12-05 NOTE — Telephone Encounter (Signed)
Rx refill sent to pharmacy. 

## 2011-12-19 ENCOUNTER — Encounter: Payer: Self-pay | Admitting: Family

## 2011-12-19 ENCOUNTER — Ambulatory Visit (INDEPENDENT_AMBULATORY_CARE_PROVIDER_SITE_OTHER): Payer: Medicare Other | Admitting: Family

## 2011-12-19 VITALS — BP 140/70 | HR 85 | Temp 97.9°F | Resp 16 | Wt 204.1 lb

## 2011-12-19 DIAGNOSIS — M722 Plantar fascial fibromatosis: Secondary | ICD-10-CM

## 2011-12-19 NOTE — Patient Instructions (Signed)
Please follow up in March as scheduled.  

## 2011-12-19 NOTE — Progress Notes (Signed)
Subjective:    Patient ID: [name], female    DOB: 05-09-1941, 71 y.o.   MRN: [id number]  HPI  Ms.  [name] is a 71 yr old female who presents today for follow up.  She presents today to discuss heel pain. She reports that the heel pain is bilateral R>L.  Pain is worse laterally, and worse with weight bearing.  She has not tried any medications for this.   Review of Systems See HPI  Past Medical History  Diagnosis Date  . Hyperplastic colon polyp   . Hypertension   . Diabetes mellitus type II   . Osteoarthritis   . GERD (gastroesophageal reflux disease)   . Osteopenia   . Lupus     of skin see dermatologist frequently  . 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 OA (osteoarthritis of spine)     C-spine  . DVT (deep venous thrombosis)     right leg DVT 05-17-2008  . Leg swelling     left leg 2-10, u/s showed a old clot, has a hypercoag panel,  . History of cardiovascular stress test     10/09 had CP, stress test neg, likely GI (GERD)-09/2006-had CP: stress test (-)  . 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 DVT (deep vein thrombosis) in pregnancy   . DVT of leg (deep venous thrombosis) 07/28/2011  . 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 Marital Status: Widowed    Spouse Name: N/A    Number of Children: 3  . Years of Education: N/A   Occupational History  . Retired    Social History Main Topics  . Smoking status: Never Smoker   . Smokeless tobacco: Never Used  . Alcohol Use: No  . Drug Use: No  . Sexually Active: Not Currently   Other Topics Concern  . Not on file   Social History Narrative   Single-widow 3 children , 1 in [place] (son, daughter - [place]) [place] to [place] 2002 from [place], [employer] Use - no   tobacco-- never  Illicit Drug Use - no Pt is Jehovah's Witness-No blood productsDaughter - [name]     Past Surgical History    Procedure Date  . Cholecystectomy   . Breast biopsy     left breast  . Breast lumpectomy     left breast-ductal carcinoma in situ  . Appendectomy   . Skin cancer excision     from back     Family History  Problem Relation Age of Onset  . Diabetes Mother   . Diabetes    . Heart attack Neg Hx   . Colon cancer Neg Hx   . Breast cancer Neg Hx     Allergies  Allergen Reactions  . Fluconazole Other (See Comments)    REACTION: severe fatigue and muscle weakness  . Metformin Diarrhea  . Pioglitazone Other (See Comments)    REACTION: wt gain  . Vancomycin     Erythematous rash, hands and toes became cyanotic.      Current Outpatient Prescriptions on File Prior to Visit  Medication Sig Dispense Refill  . Calcium Carbonate-Vitamin D (CALCIUM-VITAMIN D) 600-200 MG-UNIT CAPS Take 1 tablet by mouth 2 (two) times daily.       . cyclobenzaprine (FLEXERIL) 5 MG tablet Take 5 mg by mouth 2 (two) times  daily as needed. Muscle spasm       . furosemide (LASIX) 20 MG tablet TAKE 1 TABLET (20 MG TOTAL) BY MOUTH DAILY.  30 tablet  2  . glucosamine-chondroitin 500-400 MG tablet Take 1 tablet by mouth 2 (two) times daily.       Marland Kitchen glucose blood (FREESTYLE LITE) test strip 1 each by Other route as needed. Use as instructed to check blood sugar four times daily (250.00)      . insulin aspart (NOVOLOG) 100 UNIT/ML injection Inject 8-16 Units into the skin 3 (three) times daily.       . insulin glargine (LANTUS) 100 UNIT/ML injection Inject 40 Units into the skin at bedtime.       . Insulin Pen Needle (PEN NEEDLES 29GX1/2") 29G X MISC Use for lantus solostar pen.  100 each  0  . lisinopril (PRINIVIL,ZESTRIL) 40 MG tablet TAKE 1 TABLET (40 MG TOTAL) BY MOUTH DAILY.  90 tablet  0  . niacin 500 MG tablet Take 500 mg by mouth 2 (two) times daily.       . Garlic 500 MG CAPS Take 500 mg by mouth 2 (two) times daily.        . Lactobacillus (ACIDOPHILUS PO) Take 1 tablet by mouth daily.         BP  140/70  Pulse 85  Temp(Src) 97.9 F (36.6 C) (Oral)  Resp 16  Wt 204 lb 1.9 oz (92.588 kg)  SpO2 99%       Objective:   Physical Exam  Constitutional: She appears well-developed and well-nourished. No distress.  Cardiovascular: Normal rate and regular rhythm.   No murmur heard. Pulmonary/Chest: Effort normal and breath sounds normal. No respiratory distress. She has no wheezes. She has no rales. She exhibits no tenderness.  Musculoskeletal:       2+ LLE edema.  + tenderness to palpation bilateral heels laterally.   Psychiatric: She has a normal mood and affect. Her behavior is normal. Judgment and thought content normal.          Assessment & Plan:

## 2011-12-19 NOTE — Assessment & Plan Note (Signed)
I recommended tylenol PRN pain.  Also provided pt with Uptod[employer] printout with exercises to perform at home.

## 2011-12-25 ENCOUNTER — Telehealth: Payer: Self-pay | Admitting: *Deleted

## 2011-12-25 ENCOUNTER — Ambulatory Visit (INDEPENDENT_AMBULATORY_CARE_PROVIDER_SITE_OTHER)
Admission: RE | Admit: 2011-12-25 | Discharge: 2011-12-25 | Disposition: A | Discharge status: Home / Self Care | Payer: Medicare Other | Source: Ambulatory Visit

## 2011-12-25 DIAGNOSIS — M899 Disorder of bone, unspecified: Secondary | ICD-10-CM

## 2011-12-25 DIAGNOSIS — M8580 Other specified disorders of bone density and structure, unspecified site: Secondary | ICD-10-CM

## 2011-12-25 NOTE — Telephone Encounter (Signed)
Received call from Kristen at [facility] radiology stating pt is scheduled for bone density today and they need order placed in system. Order placed as last bone density result in EPIC is 2005.

## 2012-01-13 ENCOUNTER — Encounter: Payer: Self-pay | Admitting: Family

## 2012-01-13 ENCOUNTER — Telehealth: Payer: Self-pay | Admitting: Family

## 2012-01-13 NOTE — Telephone Encounter (Signed)
Pls call pt and let her know that her bone density shows mild bone thinning. She should continue calcium supplement and make sure that she is getting regular exercise such as walking.

## 2012-01-14 NOTE — Telephone Encounter (Signed)
Pt.notified

## 2012-01-23 ENCOUNTER — Ambulatory Visit (INDEPENDENT_AMBULATORY_CARE_PROVIDER_SITE_OTHER): Payer: Medicare Other | Admitting: Family

## 2012-01-23 ENCOUNTER — Encounter: Payer: Self-pay | Admitting: Family

## 2012-01-23 VITALS — BP 134/66 | HR 74 | Temp 98.1°F | Resp 16 | Ht 67.0 in | Wt 207.0 lb

## 2012-01-23 DIAGNOSIS — R609 Edema, unspecified: Secondary | ICD-10-CM

## 2012-01-23 DIAGNOSIS — I10 Essential (primary) hypertension: Secondary | ICD-10-CM

## 2012-01-23 DIAGNOSIS — I2699 Other pulmonary embolism without acute cor pulmonale: Secondary | ICD-10-CM

## 2012-01-23 DIAGNOSIS — E785 Hyperlipidemia, unspecified: Secondary | ICD-10-CM

## 2012-01-23 LAB — BASIC METABOLIC PANEL
CO2: 26 mEq/L (ref 19–32)
Calcium: 9.1 mg/dL (ref 8.4–10.5)
Creat: 0.87 mg/dL (ref 0.50–1.10)
Glucose, Bld: 195 mg/dL — ABNORMAL HIGH (ref 70–99)

## 2012-01-23 MED ORDER — INSULIN GLARGINE 100 UNIT/ML ~~LOC~~ SOLN: 40.0000 [IU] | Freq: Every day | SUBCUTANEOUS | Status: DC

## 2012-01-23 MED ORDER — INSULIN ASPART 100 UNIT/ML ~~LOC~~ SOLN: 20.0000 [IU] | Freq: Three times a day (TID) | SUBCUTANEOUS | Status: DC

## 2012-01-23 NOTE — Patient Instructions (Signed)
Please complete your blood work prior to leaving.  Follow up in 3 months. 

## 2012-01-23 NOTE — Assessment & Plan Note (Signed)
I demonstrated to the patient how to apply the support hose today.  Continue lasix.

## 2012-01-23 NOTE — Assessment & Plan Note (Signed)
Management per endo 

## 2012-01-23 NOTE — Assessment & Plan Note (Signed)
This is being managed by Endo, defer management.

## 2012-01-23 NOTE — Assessment & Plan Note (Signed)
Continue lifelong coumadin- management per coumadin clinic.

## 2012-01-23 NOTE — Progress Notes (Signed)
Subjective:    Patient ID: [name], female    DOB: 11-05-1941, 71 y.o.   MRN: [id number]  HPI  [name] is a 71 yr old female who presents today for follow up.  1) PE- she continues coumadin, she is following with the coumadin clinic with [facility].  2) HTN-She reports + edema.  Recently purchased some compression stockings, but is having trouble putting them on.    3) DM2-  Currently on insulin.  Last A1C on our record was back in July and was 8.5.  She is following with endocrinology- Dr. Allena Katz.  4)Hyperlipidemia- reports that Dr. Allena Katz has been monitoring and treating her lipids as well. He started her on niacin.      Review of Systems    see HPI  Past Medical History  Diagnosis Date  . Hyperplastic colon polyp   . Hypertension   . Diabetes mellitus type II   . Osteoarthritis   . GERD (gastroesophageal reflux disease)   . Osteopenia   . Lupus     of skin see dermatologist frequently  . 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 OA (osteoarthritis of spine)     C-spine  . DVT (deep venous thrombosis)     right leg DVT 05-17-2008  . Leg swelling     left leg 2-10, u/s showed a old clot, has a hypercoag panel,  . History of cardiovascular stress test     10/09 had CP, stress test neg, likely GI (GERD)-09/2006-had CP: stress test (-)  . 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 DVT (deep vein thrombosis) in pregnancy   . DVT of leg (deep venous thrombosis) 07/28/2011  . 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 Marital Status: Widowed    Spouse Name: N/A    Number of Children: 3  . Years of Education: N/A   Occupational History  . Retired    Social History Main Topics  . Smoking status: Never Smoker   . Smokeless tobacco: Never Used  . Alcohol Use: No  . Drug Use: No  . Sexually Active: Not Currently   Other Topics Concern  . Not on  file   Social History Narrative   Single-widow 3 children , 1 in [place] (son, daughter - [place]) [place] to [place] 2002 from [place], [employer] Use - no   tobacco-- never  Illicit Drug Use - no Pt is Jehovah's Witness-No blood productsDaughter - [name]     Past Surgical History  Procedure Date  . Cholecystectomy   . Breast biopsy     left breast  . Breast lumpectomy     left breast-ductal carcinoma in situ  . Appendectomy   . Skin cancer excision     from back     Family History  Problem Relation Age of Onset  . Diabetes Mother   . Diabetes    . Heart attack Neg Hx   . Colon cancer Neg Hx   . Breast cancer Neg Hx     Allergies  Allergen Reactions  . Fluconazole Other (See Comments)    REACTION: severe fatigue and muscle weakness  . Metformin Diarrhea  . Pioglitazone Other (See Comments)    REACTION: wt gain  . Vancomycin     Erythematous rash, hands and toes became cyanotic.  Current Outpatient Prescriptions on File Prior to Visit  Medication Sig Dispense Refill  . Calcium Carbonate-Vitamin D (CALCIUM-VITAMIN D) 600-200 MG-UNIT CAPS Take 1 tablet by mouth 2 (two) times daily.       . cyclobenzaprine (FLEXERIL) 5 MG tablet Take 5 mg by mouth 2 (two) times daily as needed. Muscle spasm       . furosemide (LASIX) 20 MG tablet TAKE 1 TABLET (20 MG TOTAL) BY MOUTH DAILY.  30 tablet  2  . Garlic 500 MG CAPS Take 500 mg by mouth 2 (two) times daily.        Marland Kitchen glucosamine-chondroitin 500-400 MG tablet Take 1 tablet by mouth 2 (two) times daily.       Marland Kitchen glucose blood (FREESTYLE LITE) test strip 1 each by Other route as needed. Use as instructed to check blood sugar four times daily (250.00)      . Insulin Pen Needle (PEN NEEDLES 29GX1/2") 29G X MISC Use for lantus solostar pen.  100 each  0  . Lactobacillus (ACIDOPHILUS PO) Take 1 tablet by mouth daily.       Marland Kitchen lisinopril (PRINIVIL,ZESTRIL) 40 MG tablet TAKE 1 TABLET (40 MG TOTAL) BY MOUTH DAILY.  90 tablet  0  .  niacin 500 MG tablet Take 500 mg by mouth 2 (two) times daily.       Marland Kitchen warfarin (COUMADIN) 5 MG tablet Take 1 1/2 tablets on Monday and Wednesday and 1 tablet on Saturday and Sunday.        BP 134/66  Pulse 74  Temp(Src) 98.1 F (36.7 C) (Oral)  Resp 16  Ht 5\' 7"  (1.702 m)  Wt 207 lb (93.895 kg)  BMI 32.42 kg/m2  SpO2 98%    Objective:   Physical Exam  Constitutional: She is oriented to person, place, and time. She appears well-developed and well-nourished.  Cardiovascular: Normal rate and regular rhythm.   No murmur heard. Pulmonary/Chest: Effort normal and breath sounds normal. No respiratory distress. She has no wheezes. She has no rales. She exhibits no tenderness.  Musculoskeletal:       2+ bilateral LE edema is noted.   Neurological: She is alert and oriented to person, place, and time.  Psychiatric: She has a normal mood and affect. Her behavior is normal. Judgment and thought content normal.          Assessment & Plan:

## 2012-01-25 ENCOUNTER — Encounter: Payer: Self-pay | Admitting: Family

## 2012-01-26 ENCOUNTER — Other Ambulatory Visit: Payer: Self-pay | Admitting: Internal Medicine

## 2012-01-26 NOTE — Telephone Encounter (Signed)
Opened in error

## 2012-02-18 ENCOUNTER — Telehealth: Payer: Self-pay | Admitting: *Deleted

## 2012-02-18 NOTE — Telephone Encounter (Signed)
Received call from pt stating she has not had a bowel movement in 5 days. Reports that her stomach is bloated and painful. She has eaten 6 prunes without relief of symptoms.  Please advise.

## 2012-02-18 NOTE — Telephone Encounter (Signed)
Per Sandford Craze, NP pt should take a dose of miralax and 1 dulcolax suppository. If no bowel movement by tomorrow pt should be evaluated in the office. Pt should be evaluated in the ER if she develops nausea, worsening abdominal pain or fever. Spoke with pt, she states abdominal pain is "not bad".  Advised pt per Melissa's instructions and she voices understanding.

## 2012-02-19 ENCOUNTER — Ambulatory Visit (INDEPENDENT_AMBULATORY_CARE_PROVIDER_SITE_OTHER): Payer: Medicare Other | Admitting: Internal Medicine

## 2012-02-19 ENCOUNTER — Encounter: Payer: Self-pay | Admitting: Internal Medicine

## 2012-02-19 DIAGNOSIS — K5900 Constipation, unspecified: Secondary | ICD-10-CM

## 2012-02-19 NOTE — Assessment & Plan Note (Signed)
Nl exam. Attempt magnesium citrate then fleets enema if no bm from po med. Followup if no improvement or worsening.

## 2012-02-19 NOTE — Patient Instructions (Signed)
Please try magnesium citrate. Drink 1/2 bottle (cold over ice) slowly. If not bowel movement after 2 hours please drink the second 1/2. If now bowel movement at that point try one fleets enema. Please notify the clinic if neither of the medicines provides a bowel movement

## 2012-02-19 NOTE — Progress Notes (Signed)
  Subjective:    Patient ID: [name], female    DOB: 1941/01/26, 71 y.o.   MRN: [id number]  HPI Pt presents to clinic for evaluation of constipation. Reports significant constipation for 5 days. +slight generalized abdominal pain without radiation. No nausea or vomitting. No recent illness or other trigger. Attempted miralax and dulcolax suppository with only minimal amount of stool produced. No other alleviating or exacerbating factors.   Past Medical History  Diagnosis Date  . Hyperplastic colon polyp   . Hypertension   . Diabetes mellitus type II   . Osteoarthritis   . GERD (gastroesophageal reflux disease)   . Osteopenia   . Lupus     of skin see dermatologist frequently  . 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 OA (osteoarthritis of spine)     C-spine  . DVT (deep venous thrombosis)     right leg DVT 05-17-2008  . Leg swelling     left leg 2-10, u/s showed a old clot, has a hypercoag panel,  . History of cardiovascular stress test     10/09 had CP, stress test neg, likely GI (GERD)-09/2006-had CP: stress test (-)  . 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 DVT (deep vein thrombosis) in pregnancy   . DVT of leg (deep venous thrombosis) 07/28/2011  . 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 Cholecystectomy   . Breast biopsy     left breast  . Breast lumpectomy     left breast-ductal carcinoma in situ  . Appendectomy   . Skin cancer excision     from back     reports that she has never smoked. She has never used smokeless tobacco. She reports that she does not drink alcohol or use illicit drugs. family history includes Diabetes in her mother and unspecified family member.  There is no history of Heart attack, and Colon cancer, and Breast cancer, . Allergies  Allergen Reactions  . Fluconazole Other (See Comments)    REACTION: severe fatigue and  muscle weakness  . Metformin Diarrhea  . Pioglitazone Other (See Comments)    REACTION: wt gain  . Vancomycin     Erythematous rash, hands and toes became cyanotic.         Review of Systems see hpi     Objective:   Physical Exam  Nursing note and vitals reviewed. Constitutional: She appears well-developed. No distress.  HENT:  Head: Normocephalic and atraumatic.  Eyes: Conjunctivae are normal. No scleral icterus.  Abdominal: Soft. Bowel sounds are normal. She exhibits no distension and no mass. There is no tenderness. There is no rebound and no guarding.  Neurological: She is alert.  Skin: Skin is warm and dry. She is not diaphoretic.  Psychiatric: She has a normal mood and affect.          Assessment & Plan:

## 2012-02-20 ENCOUNTER — Telehealth: Payer: Self-pay | Admitting: Family

## 2012-02-20 NOTE — Telephone Encounter (Signed)
Call placed to patient [phone], she was advised per Dr Rodena Medin instructions and has verbalized understanding.

## 2012-02-20 NOTE — Telephone Encounter (Signed)
Patient states that she was up all night with bowel movements/diarrhea. She says that her legs feel a little weak now and would like to know if Dr. Rodena Medin recommends anything to replace electrolytes that she has lost?

## 2012-02-20 NOTE — Telephone Encounter (Signed)
gatorade or pedialyte (not just for kids) but would need to monitor fsbs carefully because it contains sugar

## 2012-02-23 ENCOUNTER — Telehealth: Payer: Self-pay | Admitting: Family

## 2012-02-23 MED ORDER — NYSTATIN-TRIAMCINOLONE 100000-0.1 UNIT/GM-% EX OINT: TOPICAL_OINTMENT | Freq: Two times a day (BID) | CUTANEOUS | Status: DC

## 2012-02-23 NOTE — Telephone Encounter (Signed)
Pt states she has been using Nystatin cream for itching under her breasts. Pt reports that she gets this from Dr Constance Goltz.  Request forwarded to her office.

## 2012-02-23 NOTE — Telephone Encounter (Signed)
[name]   Call pt and let her know I will refill her cream but I have not seen her since 1/12.  She needs an Office visit. Before I will refill any more meds  Thanks

## 2012-02-24 NOTE — Telephone Encounter (Signed)
Pt aware that she needs an appt for additional refills.  She will call back to schedule

## 2012-02-27 ENCOUNTER — Emergency Department (INDEPENDENT_AMBULATORY_CARE_PROVIDER_SITE_OTHER): Payer: Medicare Other

## 2012-02-27 ENCOUNTER — Emergency Department (HOSPITAL_BASED_OUTPATIENT_CLINIC_OR_DEPARTMENT_OTHER)
Admission: EM | Admit: 2012-02-27 | Discharge: 2012-02-27 | Disposition: A | Discharge status: Home / Self Care | Payer: Medicare Other | Attending: Emergency Medicine | Admitting: Emergency Medicine

## 2012-02-27 ENCOUNTER — Encounter (HOSPITAL_BASED_OUTPATIENT_CLINIC_OR_DEPARTMENT_OTHER): Payer: Self-pay | Admitting: Emergency Medicine

## 2012-02-27 ENCOUNTER — Telehealth: Payer: Self-pay | Admitting: Family

## 2012-02-27 DIAGNOSIS — Z79899 Other long term (current) drug therapy: Secondary | ICD-10-CM | POA: Insufficient documentation

## 2012-02-27 DIAGNOSIS — N390 Urinary tract infection, site not specified: Secondary | ICD-10-CM | POA: Insufficient documentation

## 2012-02-27 DIAGNOSIS — Z86718 Personal history of other venous thrombosis and embolism: Secondary | ICD-10-CM | POA: Insufficient documentation

## 2012-02-27 DIAGNOSIS — I10 Essential (primary) hypertension: Secondary | ICD-10-CM | POA: Insufficient documentation

## 2012-02-27 DIAGNOSIS — M549 Dorsalgia, unspecified: Secondary | ICD-10-CM

## 2012-02-27 DIAGNOSIS — R079 Chest pain, unspecified: Secondary | ICD-10-CM | POA: Insufficient documentation

## 2012-02-27 DIAGNOSIS — Z86711 Personal history of pulmonary embolism: Secondary | ICD-10-CM

## 2012-02-27 DIAGNOSIS — R072 Precordial pain: Secondary | ICD-10-CM

## 2012-02-27 DIAGNOSIS — E119 Type 2 diabetes mellitus without complications: Secondary | ICD-10-CM | POA: Insufficient documentation

## 2012-02-27 DIAGNOSIS — R911 Solitary pulmonary nodule: Secondary | ICD-10-CM

## 2012-02-27 DIAGNOSIS — R0602 Shortness of breath: Secondary | ICD-10-CM

## 2012-02-27 DIAGNOSIS — Z853 Personal history of malignant neoplasm of breast: Secondary | ICD-10-CM | POA: Insufficient documentation

## 2012-02-27 LAB — URINALYSIS, ROUTINE W REFLEX MICROSCOPIC
Bilirubin Urine: NEGATIVE
Glucose, UA: 100 mg/dL — AB
Ketones, ur: NEGATIVE mg/dL
pH: 5 (ref 5.0–8.0)

## 2012-02-27 LAB — URINE MICROSCOPIC-ADD ON

## 2012-02-27 LAB — CBC
Hemoglobin: 12 g/dL (ref 12.0–15.0)
MCV: 82.1 fL (ref 78.0–100.0)
Platelets: 173 10*3/uL (ref 150–400)
RBC: 4.35 MIL/uL (ref 3.87–5.11)
WBC: 7.1 10*3/uL (ref 4.0–10.5)

## 2012-02-27 LAB — COMPREHENSIVE METABOLIC PANEL
ALT: 30 U/L (ref 0–35)
Alkaline Phosphatase: 83 U/L (ref 39–117)
CO2: 24 mEq/L (ref 19–32)
GFR calc Af Amer: >90 mL/min (ref >90)
GFR calc non Af Amer: 85 mL/min — ABNORMAL LOW (ref >90)
Glucose, Bld: 217 mg/dL — ABNORMAL HIGH (ref 70–99)
Potassium: 3.8 mEq/L (ref 3.5–5.1)
Sodium: 140 mEq/L (ref 135–145)
Total Bilirubin: 0.5 mg/dL (ref 0.3–1.2)

## 2012-02-27 LAB — DIFFERENTIAL
Lymphocytes Relative: 25 % (ref 12–46)
Lymphs Abs: 1.7 10*3/uL (ref 0.7–4.0)
Neutrophils Relative %: 69 % (ref 43–77)

## 2012-02-27 LAB — TROPONIN I: Troponin I: <0.3 ng/mL (ref <0.30)

## 2012-02-27 MED ORDER — DEXTROSE 5 % IV SOLN
1.0000 g | Freq: Once | INTRAVENOUS | Status: AC
  Administered 2012-02-27: 1 g via INTRAVENOUS
  Filled 2012-02-27: qty 10

## 2012-02-27 MED ORDER — IOHEXOL 350 MG/ML SOLN
80.0000 mL | Freq: Once | INTRAVENOUS | Status: AC | PRN
  Administered 2012-02-27: 80 mL via INTRAVENOUS

## 2012-02-27 MED ORDER — CEPHALEXIN 500 MG PO CAPS: 500.0000 mg | ORAL_CAPSULE | Freq: Four times a day (QID) | ORAL | Status: AC

## 2012-02-27 MED ORDER — MORPHINE SULFATE 2 MG/ML IJ SOLN: 2.0000 mg | Freq: Once | INTRAMUSCULAR | Status: DC

## 2012-02-27 MED ORDER — NAPROXEN 500 MG PO TABS: 500.0000 mg | ORAL_TABLET | Freq: Two times a day (BID) | ORAL | Status: DC

## 2012-02-27 MED ORDER — SODIUM CHLORIDE 0.9 % IV SOLN
Freq: Once | INTRAVENOUS | Status: AC
  Administered 2012-02-27: 12:00:00 via INTRAVENOUS

## 2012-02-27 NOTE — ED Notes (Signed)
Pt does not want pain medication. MD informed

## 2012-02-27 NOTE — ED Notes (Signed)
MD at bedside. 

## 2012-02-27 NOTE — ED Provider Notes (Signed)
History     CSN: [id number]  Arrival date & time 02/27/12  4098   First MD Initiated Contact with Patient 02/27/12 [phone]      Chief Complaint  Patient presents with  . Chest Pain    (Consider location/radiation/quality/duration/timing/severity/associated sxs/prior treatment) HPI Comments: Patient presents with 3 days of constant substernal chest pain it radiates to her back. Is not taking anything for this pain. It is worse with laying down. She is worried she has a history of pulmonary embolism and DVT with states compliance with her Coumadin. She reports no cardiac history and had a negative stress test 2 years ago. Denies any cough, fever, abdominal pain, nausea or vomiting. Nothing makes the pain better or worse. She's had mild shortness of breath with exertion for several months which her primary Dr. Is aware about.  She denies any new or worsening shortness of breath.  The history is provided by the patient.    Past Medical History  Diagnosis Date  . Hyperplastic colon polyp   . Hypertension   . Diabetes mellitus type II   . Osteoarthritis   . GERD (gastroesophageal reflux disease)   . Osteopenia   . Lupus     of skin see dermatologist frequently  . 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 OA (osteoarthritis of spine)     C-spine  . DVT (deep venous thrombosis)     right leg DVT 05-17-2008  . Leg swelling     left leg 2-10, u/s showed a old clot, has a hypercoag panel,  . History of cardiovascular stress test     10/09 had CP, stress test neg, likely GI (GERD)-09/2006-had CP: stress test (-)  . 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 DVT (deep vein thrombosis) in pregnancy   . DVT of leg (deep venous thrombosis) 07/28/2011  . 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 Cholecystectomy   . Breast biopsy     left breast  . Breast lumpectomy     left  breast-ductal carcinoma in situ  . Appendectomy   . Skin cancer excision     from back     Family History  Problem Relation Age of Onset  . Diabetes Mother   . Diabetes    . Heart attack Neg Hx   . Colon cancer Neg Hx   . Breast cancer Neg Hx     History  Substance Use Topics  . Smoking status: Never Smoker   . Smokeless tobacco: Never Used  . Alcohol Use: No    OB History    Grav Para Term Preterm Abortions TAB SAB Ect Mult Living                  Review of Systems  Constitutional: Negative for fever, activity change and appetite change.  HENT: Negative for congestion and rhinorrhea.   Eyes: Negative for visual disturbance.  Respiratory: Positive for chest tightness and shortness of breath. Negative for cough.   Cardiovascular: Positive for chest pain.  Gastrointestinal: Negative for nausea, vomiting and abdominal pain.  Genitourinary: Negative for dysuria and hematuria.  Musculoskeletal: Positive for back pain.  Skin: Negative for rash.  Neurological: Negative for dizziness and headaches.    Allergies  Fluconazole; Metformin; Pioglitazone; and Vancomycin  Home Medications   Current Outpatient Rx  Name Route  Sig Dispense Refill  . CALCIUM-VITAMIN D 600-200 MG-UNIT PO CAPS Oral Take 1 tablet by mouth 2 (two) times daily.     . CYCLOBENZAPRINE HCL 5 MG PO TABS Oral Take 5 mg by mouth 2 (two) times daily as needed. Muscle spasm     . FUROSEMIDE 20 MG PO TABS  TAKE 1 TABLET (20 MG TOTAL) BY MOUTH DAILY. 30 tablet 2    CYCLE FILL MEDICATION. Authorization is required f ...  . GARLIC 500 MG PO CAPS Oral Take 500 mg by mouth 2 (two) times daily.      Marland Kitchen GLUCOSAMINE-CHONDROITIN 500-400 MG PO TABS Oral Take 1 tablet by mouth 2 (two) times daily.     Marland Kitchen GLUCOSE BLOOD VI STRP Other 1 each by Other route as needed. Use as instructed to check blood sugar four times daily (250.00)    . INSULIN ASPART 100 UNIT/ML [place] SOLN Subcutaneous Inject 20-28 Units into the skin 3 (three)  times daily. 2 vial 0    Lot [id number] Exp 05/2013  . INSULIN GLARGINE 100 UNIT/ML [place] SOLN Subcutaneous Inject 40 Units into the skin at bedtime. 6 mL 0    Lot [id number] Exp 06/2014  . PEN NEEDLES 1/2" 29G X MISC  Use for lantus solostar pen. 100 each 0  . ACIDOPHILUS PO Oral Take 1 tablet by mouth daily.     Marland Kitchen LISINOPRIL 40 MG PO TABS  TAKE 1 TABLET (40 MG TOTAL) BY MOUTH DAILY. 90 tablet 0    CYCLE FILL MEDICATION. Authorization is required f ...  . NIACIN 500 MG PO TABS Oral Take 500 mg by mouth 2 (two) times daily.     . NYSTATIN-TRIAMCINOLONE 100000-0.1 UNIT/GM-% EX OINT Topical Apply topically 2 (two) times daily. 15 g 0  . WARFARIN SODIUM 5 MG PO TABS  Take 1 1/2 tablets on Monday and Wednesday and 1 tablet on Saturday and Sunday.      BP 149/54  Pulse 64  Temp(Src) 98.3 F (36.8 C) (Oral)  Resp 17  Ht 5\' 8"  (1.727 m)  Wt 200 lb (90.719 kg)  BMI 30.41 kg/m2  SpO2 99%  Physical Exam  Constitutional: She is oriented to person, place, and time. She appears well-developed and well-nourished. No distress.       Speaking in full sentences, in no distress  HENT:  Head: Normocephalic and atraumatic.  Mouth/Throat: Oropharynx is clear and moist. No oropharyngeal exudate.  Eyes: Conjunctivae are normal. Pupils are equal, round, and reactive to light.  Neck: Normal range of motion.  Cardiovascular: Normal rate, regular rhythm and normal heart sounds.   No murmur heard. Pulmonary/Chest: Effort normal and breath sounds normal. No respiratory distress. She exhibits tenderness.       Reproducible chest wall tenderness between breasts, no rash  Abdominal: Soft. There is no tenderness. There is no rebound and no guarding.  Musculoskeletal: Normal range of motion. She exhibits no edema and no tenderness.       Tender to palpation right thoracic or spinal muscles, no midline pain, no rash  Neurological: She is alert and oriented to person, place, and time. No cranial nerve deficit.  Skin:  Skin is warm.    ED Course  Procedures (including critical care time)  Labs Reviewed  CBC - Abnormal; Notable for the following:    HCT 35.7 (*)    All other components within normal limits  COMPREHENSIVE METABOLIC PANEL - Abnormal; Notable for the following:    Glucose, Bld 217 (*)  Albumin 3.4 (*)    GFR calc non Af Amer 85 (*)    All other components within normal limits  PROTIME-INR - Abnormal; Notable for the following:    Prothrombin Time 28.8 (*)    INR 2.66 (*)    All other components within normal limits  URINALYSIS, ROUTINE W REFLEX MICROSCOPIC - Abnormal; Notable for the following:    APPearance CLOUDY (*)    Glucose, UA 100 (*)    Hgb urine dipstick MODERATE (*)    Leukocytes, UA LARGE (*)    All other components within normal limits  URINE MICROSCOPIC-ADD ON - Abnormal; Notable for the following:    Squamous Epithelial / LPF FEW (*)    Bacteria, UA MANY (*)    All other components within normal limits  GLUCOSE, CAPILLARY - Abnormal; Notable for the following:    Glucose-Capillary 129 (*)    All other components within normal limits  DIFFERENTIAL  TROPONIN I  LIPASE, BLOOD  TROPONIN I   Dg Chest 2 View  02/27/2012  *RADIOLOGY REPORT*  Clinical Data: Midsternal chest pain with shortness of breath.  CHEST - 2 VIEW  Comparison: 01/29/2010.  Findings: Normal heart size with clear lung fields.  Coronary calcification.  Unremarkable hilar and mediastinal contours. Degenerative changes thoracic spine.  No effusion or pneumothorax. Coronary calcification is more prominent when compared with 2011.  IMPRESSION: No active cardiopulmonary disease.  Original Report Authenticated By: Elsie Stain, M.D.   Ct Angio Chest W/cm &/or Wo Cm  02/27/2012  *RADIOLOGY REPORT*  Clinical Data: Chest pain and back pain.  History of pulmonary emboli.  CT ANGIOGRAPHY CHEST  Technique:  Multidetector CT imaging of the chest using the standard protocol during bolus administration of  intravenous contrast. Multiplanar reconstructed images including MIPs were obtained and reviewed to evaluate the vascular anatomy.  Contrast: 80mL OMNIPAQUE IOHEXOL 350 MG/ML SOLN  Comparison: 07/28/2011 CT.  Findings: No evidence for recurrent pulmonary emboli.  Moderate atheromatous change aortic arch and coronary arteries including left anterior descending and left circumflex. Normal heart size. Clear lung fields.  No pericardial or pleural effusion.  No pneumothorax.  5 x 6 mm nodule centrally within the right middle lobe is unchanged from 2011.  Scattered hilar and mediastinal lymph nodes slightly bulky are also stable from 2011 likely reactive.  Hepatic steatosis.  Tiny granuloma right hepatic lobe.  Unremarkable spleen.  Small right thyroid calcification incompletely evaluated. Osseous structures unremarkable.  IMPRESSION: No evidence for pulmonary embolic disease.  No acute infiltrate, pleural effusion, pneumothorax, or other significant acute process  Stable small right middle lobe noncalcified nodule and stable mildly prominent mediastinal adenopathy when compared to 2011.  Original Report Authenticated By: Elsie Stain, M.D.     No diagnosis found.    MDM  3 days of chest and back pain or shortness of breath. History of pulmonary emboli. Vitals stable, no distress, EKG nonischemic. Low suspicion for ACS based on history.  INR therapeutic. No know pulmonary embolism Seen on CT scan of the chest and no aortic pathology. Patient's chest and back pain have improved. She's had 2 negative troponins and a nonischemic EKG history is not consistent with ACS. She has a urinary tract infection which may be contributed to her back pain. She'll followup with her doctor as scheduled. She is declining pain medication in the ED.   Date: 02/27/2012  Rate: 77  Rhythm: normal sinus rhythm  QRS Axis: normal  Intervals: normal  ST/T Wave abnormalities:  normal  Conduction Disutrbances:none  Narrative  Interpretation:   Old EKG Reviewed: unchanged          Glynn Octave, MD 02/27/12 1301

## 2012-02-27 NOTE — ED Notes (Signed)
Pt states pain radiates to back

## 2012-02-27 NOTE — Telephone Encounter (Signed)
The doctor in the ed gave her keflex 500 mg and she is to take 4 per day.  He said she has a uti and that is causing pain in her back.  The sheet with the antibiotic said it may thin her blood she is already on warfarin  What should she do?  She also had a ct and the blood clots in her lungs are gone.

## 2012-02-27 NOTE — Telephone Encounter (Signed)
Notified pt and she voices understanding. 

## 2012-02-27 NOTE — Telephone Encounter (Signed)
She should contact her coumadin clinic and let them know that she is starting an antibiotic.  They should repeat her INR in 1 week.

## 2012-02-27 NOTE — Telephone Encounter (Signed)
Pt called.  Says she has pain "between my busts" radiates to back, had pain "all night." Still having pain.  I advised the pt to go directly to the ED for further evaluation. She verbalizes understanding.

## 2012-02-27 NOTE — ED Notes (Signed)
Pt rates pain 4-5

## 2012-02-27 NOTE — ED Notes (Signed)
Pt c/o of midsternal cp that began 3 days ago - hx of blood clots to lungs States has sob with exertion

## 2012-02-27 NOTE — Discharge Instructions (Signed)
Chest Pain (Nonspecific) There is no evidence of heart attack today or blood clot in the lung. Continue your Coumadin and follow up with your doctor as scheduled. It is often hard to give a specific diagnosis for the cause of chest pain. There is always a chance that your pain could be related to something serious, such as a heart attack or a blood clot in the lungs. You need to follow up with your caregiver for further evaluation. CAUSES   Heartburn.   Pneumonia or bronchitis.   Anxiety or stress.   Inflammation around your heart (pericarditis) or lung (pleuritis or pleurisy).   A blood clot in the lung.   A collapsed lung (pneumothorax). It can develop suddenly on its own (spontaneous pneumothorax) or from injury (trauma) to the chest.   Shingles infection (herpes zoster virus).  The chest wall is composed of bones, muscles, and cartilage. Any of these can be the source of the pain.  The bones can be bruised by injury.   The muscles or cartilage can be strained by coughing or overwork.   The cartilage can be affected by inflammation and become sore (costochondritis).  DIAGNOSIS  Lab tests or other studies, such as X-rays, electrocardiography, stress testing, or cardiac imaging, may be needed to find the cause of your pain.  TREATMENT   Treatment depends on what may be causing your chest pain. Treatment may include:   Acid blockers for heartburn.   Anti-inflammatory medicine.   Pain medicine for inflammatory conditions.   Antibiotics if an infection is present.   You may be advised to change lifestyle habits. This includes stopping smoking and avoiding alcohol, caffeine, and chocolate.   You may be advised to keep your head raised (elevated) when sleeping. This reduces the chance of acid going backward from your stomach into your esophagus.   Most of the time, nonspecific chest pain will improve within 2 to 3 days with rest and mild pain medicine.  HOME CARE INSTRUCTIONS     If antibiotics were prescribed, take your antibiotics as directed. Finish them even if you start to feel better.   For the next few days, avoid physical activities that bring on chest pain. Continue physical activities as directed.   Do not smoke.   Avoid drinking alcohol.   Only take over-the-counter or prescription medicine for pain, discomfort, or fever as directed by your caregiver.   Follow your caregiver's suggestions for further testing if your chest pain does not go away.   Keep any follow-up appointments you made. If you do not go to an appointment, you could develop lasting (chronic) problems with pain. If there is any problem keeping an appointment, you must call to reschedule.  SEEK MEDICAL CARE IF:   You think you are having problems from the medicine you are taking. Read your medicine instructions carefully.   Your chest pain does not go away, even after treatment.   You develop a rash with blisters on your chest.  SEEK IMMEDIATE MEDICAL CARE IF:   You have increased chest pain or pain that spreads to your arm, neck, jaw, back, or abdomen.   You develop shortness of breath, an increasing cough, or you are coughing up blood.   You have severe back or abdominal pain, feel nauseous, or vomit.   You develop severe weakness, fainting, or chills.   You have a fever.  THIS IS AN EMERGENCY. Do not wait to see if the pain will go away. Get medical help  at once. Call your local emergency services (911 in [place] Do not drive yourself to the hospital. MAKE SURE YOU:   Understand these instructions.   Will watch your condition.   Will get help right away if you are not doing well or get worse.  Document Released: 08/13/2005 Document Revised: 10/23/2011 Document Reviewed: 06/08/2008 [facility] Patient Information 2012 [place], [place].Urinary Tract Infection Infections of the urinary tract can start in several places. A bladder infection (cystitis), a kidney infection  (pyelonephritis), and a prostate infection (prostatitis) are different types of urinary tract infections (UTIs). They usually get better if treated with medicines (antibiotics) that kill germs. Take all the medicine until it is gone. You or your child may feel better in a few days, but TAKE ALL MEDICINE or the infection may not respond and may become more difficult to treat. HOME CARE INSTRUCTIONS   Drink enough water and fluids to keep the urine clear or pale yellow. Cranberry juice is especially recommended, in addition to large amounts of water.   Avoid caffeine, tea, and carbonated beverages. They tend to irritate the bladder.   Alcohol may irritate the prostate.   Only take over-the-counter or prescription medicines for pain, discomfort, or fever as directed by your caregiver.  To prevent further infections:  Empty the bladder often. Avoid holding urine for long periods of time.   After a bowel movement, women should cleanse from front to back. Use each tissue only once.   Empty the bladder before and after sexual intercourse.  FINDING OUT THE RESULTS OF YOUR TEST Not all test results are available during your visit. If your or your child's test results are not back during the visit, make an appointment with your caregiver to find out the results. Do not assume everything is normal if you have not heard from your caregiver or the medical facility. It is important for you to follow up on all test results. SEEK MEDICAL CARE IF:   There is back pain.   Your baby is older than 3 months with a rectal temperature of 100.5 F (38.1 C) or higher for more than 1 day.   Your or your child's problems (symptoms) are no better in 3 days. Return sooner if you or your child is getting worse.  SEEK IMMEDIATE MEDICAL CARE IF:   There is severe back pain or lower abdominal pain.   You or your child develops chills.   You have a fever.   Your baby is older than 3 months with a rectal temperature  of 102 F (38.9 C) or higher.   Your baby is 63 months old or younger with a rectal temperature of 100.4 F (38 C) or higher.   There is nausea or vomiting.   There is continued burning or discomfort with urination.  MAKE SURE YOU:   Understand these instructions.   Will watch your condition.   Will get help right away if you are not doing well or get worse.  Document Released: 08/13/2005 Document Revised: 10/23/2011 Document Reviewed: 03/18/2007 [facility] Patient Information 2012 [place], [place].

## 2012-03-02 ENCOUNTER — Ambulatory Visit (INDEPENDENT_AMBULATORY_CARE_PROVIDER_SITE_OTHER): Payer: Medicare Other | Admitting: Family

## 2012-03-02 ENCOUNTER — Encounter: Payer: Self-pay | Admitting: Family

## 2012-03-02 VITALS — BP 118/62 | HR 78 | Temp 98.4°F | Resp 16 | Wt 207.0 lb

## 2012-03-02 DIAGNOSIS — R0789 Other chest pain: Secondary | ICD-10-CM | POA: Insufficient documentation

## 2012-03-02 DIAGNOSIS — N390 Urinary tract infection, site not specified: Secondary | ICD-10-CM

## 2012-03-02 NOTE — Assessment & Plan Note (Signed)
Resolved. Work up negative in ED.  Monitor.

## 2012-03-02 NOTE — Patient Instructions (Signed)
Please take keflex today then stop. Please follow up in June as scheduled.

## 2012-03-02 NOTE — Assessment & Plan Note (Signed)
Symptoms are resolved. She is on her 5th day of keflex.  Also received IV Rocephin in ED.  I advised her that she can d/c keflex today as she is having some GI side effects.

## 2012-03-02 NOTE — Progress Notes (Signed)
Subjective:    Patient ID: [name], female    DOB: [birth year], 71 y.o.   MRN: [id number]  HPI  Ms.  [name] is a 71 yr old female who presents today for follow up.  She was seen in the ED on Friday for chest pain.  She underwent cardiac enzymes x 2 which were negative and a CTA chest which was neg for PE or acute findings.  She was found to have a UTI and was placed on keflex. She reports that dysuria has resolved. She has had no further chest pain since returning home.   She had her inr checked today and it was 2.3.   Review of Systems See HPI  Past Medical History  Diagnosis Date  . Hyperplastic colon polyp   . Hypertension   . Diabetes mellitus type II   . Osteoarthritis   . GERD (gastroesophageal reflux disease)   . Osteopenia   . Lupus     of skin see dermatologist frequently  . 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 OA (osteoarthritis of spine)     C-spine  . DVT (deep venous thrombosis)     right leg DVT 05-17-2008  . Leg swelling     left leg 2-10, u/s showed a old clot, has a hypercoag panel,  . History of cardiovascular stress test     10/09 had CP, stress test neg, likely GI (GERD)-09/2006-had CP: stress test (-)  . 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 DVT (deep vein thrombosis) in pregnancy   . DVT of leg (deep venous thrombosis) 07/28/2011  . 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 Marital Status: Widowed    Spouse Name: N/A    Number of Children: 3  . Years of Education: N/A   Occupational History  . Retired    Social History Main Topics  . Smoking status: Never Smoker   . Smokeless tobacco: Never Used  . Alcohol Use: No  . Drug Use: No  . Sexually Active: Not Currently   Other Topics Concern  . Not on file   Social History Narrative   Single-widow 3 children , 1 in [place] (son, daughter - [place]) [place] to [place]  from [place], [employer] Use - no   tobacco-- never  Illicit Drug Use - no Pt is Jehovah's Witness-No blood productsDaughter - [name]     Past Surgical History  Procedure Date  . Cholecystectomy   . Breast biopsy     left breast  . Breast lumpectomy     left breast-ductal carcinoma in situ  . Appendectomy   . Skin cancer excision     from back     Family History  Problem Relation Age of Onset  . Diabetes Mother   . Diabetes    . Heart attack Neg Hx   . Colon cancer Neg Hx   . Breast cancer Neg Hx     Allergies  Allergen Reactions  . Fluconazole Other (See Comments)    REACTION: severe fatigue and muscle weakness  . Metformin Diarrhea  . Pioglitazone Other (See Comments)    REACTION: wt gain  . Vancomycin     Erythematous rash, hands and toes became cyanotic.      Current Outpatient Prescriptions on File Prior to Visit  Medication Sig Dispense Refill  .  Calcium Carbonate-Vitamin D (CALCIUM-VITAMIN D) 600-200 MG-UNIT CAPS Take 1 tablet by mouth 2 (two) times daily.       . cephALEXin (KEFLEX) 500 MG capsule Take 1 capsule (500 mg total) by mouth 4 (four) times daily.  40 capsule  0  . cyclobenzaprine (FLEXERIL) 5 MG tablet Take 5 mg by mouth 2 (two) times daily as needed. Muscle spasm       . furosemide (LASIX) 20 MG tablet TAKE 1 TABLET (20 MG TOTAL) BY MOUTH DAILY.  30 tablet  2  . Garlic 500 MG CAPS Take 500 mg by mouth 2 (two) times daily.        Marland Kitchen glucosamine-chondroitin 500-400 MG tablet Take 1 tablet by mouth 2 (two) times daily.       Marland Kitchen glucose blood (FREESTYLE LITE) test strip 1 each by Other route as needed. Use as instructed to check blood sugar four times daily (250.00)      . insulin aspart (NOVOLOG) 100 UNIT/ML injection Inject 20-28 Units into the skin 3 (three) times daily.  2 vial  0  . insulin glargine (LANTUS) 100 UNIT/ML injection Inject 40 Units into the skin at bedtime.  6 mL  0  . Insulin Pen Needle (PEN NEEDLES 29GX1/2") 29G X MISC Use for  lantus solostar pen.  100 each  0  . Lactobacillus (ACIDOPHILUS PO) Take 1 tablet by mouth daily.       Marland Kitchen lisinopril (PRINIVIL,ZESTRIL) 40 MG tablet TAKE 1 TABLET (40 MG TOTAL) BY MOUTH DAILY.  90 tablet  0  . niacin 500 MG tablet Take 500 mg by mouth 2 (two) times daily.       Marland Kitchen nystatin-triamcinolone ointment (MYCOLOG) Apply topically 2 (two) times daily.  15 g  0  . warfarin (COUMADIN) 5 MG tablet Take 1 1/2 tablets on Monday and Wednesday and 1 tablet on Saturday and Sunday.        BP 118/62  Pulse 78  Temp(Src) 98.4 F (36.9 C) (Oral)  Resp 16  Wt 207 lb (93.895 kg)  SpO2 98%       Objective:   Physical Exam  Constitutional: She appears well-developed and well-nourished. No distress.  Cardiovascular: Normal rate and regular rhythm.   No murmur heard. Pulmonary/Chest: Effort normal and breath sounds normal. No respiratory distress. She has no wheezes. She has no rales. She exhibits no tenderness.  Musculoskeletal:       1+ bilateral LE edema.   Psychiatric: She has a normal mood and affect. Her speech is normal and behavior is normal. Thought content normal.          Assessment & Plan:

## 2012-03-12 ENCOUNTER — Telehealth: Payer: Self-pay | Admitting: Family

## 2012-03-12 DIAGNOSIS — M549 Dorsalgia, unspecified: Secondary | ICD-10-CM | POA: Insufficient documentation

## 2012-03-12 NOTE — Telephone Encounter (Signed)
Patient states that she would like [name] to write her a prescription for a back brace. Patient says that [facility] went to [facility] on [address] and they do require a prescription for this.

## 2012-03-12 NOTE — Telephone Encounter (Signed)
Patient called again requesting back brace. She would like [place] to fax over prescription to [facility].

## 2012-03-12 NOTE — Telephone Encounter (Signed)
Rx written.

## 2012-03-12 NOTE — Telephone Encounter (Signed)
Spoke with pt and advised her rx has been completed. She requests rx be called to [facility] at [phone]. Spoke to [place] at [employer] and obtained fax [phone]. Rx faxed.

## 2012-03-26 ENCOUNTER — Telehealth: Payer: Self-pay | Admitting: *Deleted

## 2012-03-26 ENCOUNTER — Telehealth: Payer: Self-pay | Admitting: Family

## 2012-03-26 NOTE — Telephone Encounter (Signed)
She may use claritin 10mg  once daily- ok to take with blood thinner.

## 2012-03-26 NOTE — Telephone Encounter (Signed)
Head stopped up and she is sneezing.  Cant take over the counter because of her blood thinner.  Could you call something in

## 2012-03-26 NOTE — Telephone Encounter (Signed)
Pt called stating the coumadin clinic is wanting her to have a CBC. Advised pt that her last CBC was on 02/27/12. Mailed copy to pt to take to her next appt with the coumadin clinic.

## 2012-03-26 NOTE — Telephone Encounter (Signed)
Notified pt. 

## 2012-04-01 ENCOUNTER — Telehealth: Payer: Self-pay | Admitting: Hematology & Oncology

## 2012-04-01 ENCOUNTER — Ambulatory Visit: Payer: Medicare Other | Admitting: Hematology & Oncology

## 2012-04-01 ENCOUNTER — Other Ambulatory Visit: Payer: Medicare Other | Admitting: Lab

## 2012-04-01 NOTE — Telephone Encounter (Signed)
Pt called and cx 04/01/12 apt due to personal issue.  She resch for 04/06/12.  Nurse was notified of changes

## 2012-04-06 ENCOUNTER — Other Ambulatory Visit (HOSPITAL_BASED_OUTPATIENT_CLINIC_OR_DEPARTMENT_OTHER): Payer: Medicare Other | Admitting: Lab

## 2012-04-06 ENCOUNTER — Ambulatory Visit (HOSPITAL_BASED_OUTPATIENT_CLINIC_OR_DEPARTMENT_OTHER): Payer: Medicare Other | Admitting: Hematology & Oncology

## 2012-04-06 VITALS — BP 150/72 | HR 79 | Temp 98.2°F | Ht 68.0 in | Wt 204.0 lb

## 2012-04-06 DIAGNOSIS — I82402 Acute embolism and thrombosis of unspecified deep veins of left lower extremity: Secondary | ICD-10-CM

## 2012-04-06 DIAGNOSIS — E119 Type 2 diabetes mellitus without complications: Secondary | ICD-10-CM

## 2012-04-06 DIAGNOSIS — E041 Nontoxic single thyroid nodule: Secondary | ICD-10-CM

## 2012-04-06 DIAGNOSIS — I82409 Acute embolism and thrombosis of unspecified deep veins of unspecified lower extremity: Secondary | ICD-10-CM

## 2012-04-06 DIAGNOSIS — D649 Anemia, unspecified: Secondary | ICD-10-CM

## 2012-04-06 DIAGNOSIS — R5381 Other malaise: Secondary | ICD-10-CM

## 2012-04-06 DIAGNOSIS — I2699 Other pulmonary embolism without acute cor pulmonale: Secondary | ICD-10-CM

## 2012-04-06 LAB — CBC WITH DIFFERENTIAL (CANCER CENTER ONLY)
BASO%: 0.2 % (ref 0.0–2.0)
EOS%: 2.4 % (ref 0.0–7.0)
HCT: 38.3 % (ref 34.8–46.6)
LYMPH#: 1.9 10*3/uL (ref 0.9–3.3)
LYMPH%: 23.4 % (ref 14.0–48.0)
MCHC: 33.4 g/dL (ref 32.0–36.0)
NEUT%: 67.8 % (ref 39.6–80.0)
RDW: 14 % (ref 11.1–15.7)

## 2012-04-06 LAB — IRON AND TIBC: UIBC: 278 ug/dL (ref 125–400)

## 2012-04-06 NOTE — Progress Notes (Signed)
CC:   Kelsey Craze, NP  DIAGNOSES: 1. History of recurrent deep venous thrombosis/pulmonary embolism. 2. Fatigue. 3. 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  CURRENT THERAPY:  Coumadin to maintain INR between 2-3.  INTERIM HISTORY:  [name] comes in for followup.  I saw her back in November.  That was her first appointment with [place].  At that point in time, I did not think that there was much that we needed to do with her. She is on lifelong anticoagulation.  She is not having any issues with this.  She is on several medications.  Of note, she is a TEFL teacher Witness and so blood transfusions are not an issue/option for her.  She does have a lot of fatigue.  She has had no obvious bleeding.  Going back to iron studies done on her back in November back in September 2012, her iron saturation was only 13% with a total iron of 37.  As such, I suspect that she is probably iron deficient.  Again, she is on quite a few medications.  She is a diabetic.  She was in the emergency room in April.  She had a CT of the chest done. This was negative for pulmonary embolism.  She had a stable 5 x 6 mm right lower lobe nodule.  PHYSICAL EXAMINATION:  General:  This is a mildly obese white female in no obvious distress.  Vital signs:  98.2, pulse 79, respiratory rate 18, blood pressure 150/72.  Weight is 204.  Head and neck:  Exam shows a normocephalic, atraumatic skull.  There are no ocular or oral lesions. There are no palpable cervical or supraclavicular lymph nodes.  Lungs: Clear bilaterally.  Cardiac:  Regular rate and rhythm with a normal S1 and S2.  There are no murmurs, rubs or bruits.  Abdomen:  Soft with good bowel sounds.  There is no palpable abdominal mass.  There is no fluid wave.  There is no palpable hepatosplenomegaly.  Extremities:  Shows some stasis dermatitis changes in her lower legs.  She has 1+ edema in her lower legs.  There may be some  slightly more edema in her left leg than right.  Neurological:  Shows no focal neurological deficit.  LABORATORY STUDIES:  White cell count 18, hemoglobin 12.8, hematocrit 38.3, platelet count 178.  MCV is 82.  IMPRESSION:  [name] is a very nice 71 year old white female with history of recurrent DVT/PE.  She is on Coumadin.  She is on Coumadin for life from my point of view.  Her main issues now are this fatigue.  Again, we will have to see what her iron studies show.  I think that if her iron is low, then we can give her IV iron.  This would not be violating the philosophy of her Jehovah's Witness principles.  Of note, she did have a Doppler done of her lower legs back in December. There was no obvious DVT noted in her legs bilaterally.  We will plan to get her back to see [place] in about 6 weeks' time.  Again, I would think that she is probably going to need some iron.  We will let her know once the iron studies come back.    ______________________________ Josph Macho, M.D. PRE/MEDQ  D:  04/06/2012  T:  04/06/2012  Job:  [id number]

## 2012-04-06 NOTE — Progress Notes (Signed)
This office note has been dictated.

## 2012-04-08 ENCOUNTER — Telehealth: Payer: Self-pay | Admitting: *Deleted

## 2012-04-08 ENCOUNTER — Other Ambulatory Visit: Payer: Self-pay | Admitting: *Deleted

## 2012-04-08 ENCOUNTER — Telehealth: Payer: Self-pay | Admitting: Hematology & Oncology

## 2012-04-08 DIAGNOSIS — D649 Anemia, unspecified: Secondary | ICD-10-CM

## 2012-04-08 NOTE — Telephone Encounter (Signed)
Message copied by Anselm Jungling on Thu Apr 08, 2012  9:20 AM ------      Message from: Arlan Organ R      Created: Thu Apr 08, 2012  7:14 AM       Call the patient and tell her that her iron is on the low side. She will need to be transfused with iron. Please set up aFeraHeme infusion at 1020 mg in the 1-2 weeks.  pete

## 2012-04-08 NOTE — Telephone Encounter (Signed)
Pt aware of 6-3 iron 

## 2012-04-08 NOTE — Telephone Encounter (Signed)
Called patient and left message on personal cell phone that her iron levels are low and needs one dose of Feraheme 1020 mg in next few weeks.  Told patient to call us to schedule

## 2012-04-13 ENCOUNTER — Telehealth: Payer: Self-pay | Admitting: *Deleted

## 2012-04-13 DIAGNOSIS — Z531 Procedure and treatment not carried out because of patient's decision for reasons of belief and group pressure: Secondary | ICD-10-CM | POA: Insufficient documentation

## 2012-04-13 DIAGNOSIS — IMO0001 Reserved for inherently not codable concepts without codable children: Secondary | ICD-10-CM | POA: Insufficient documentation

## 2012-04-13 NOTE — Telephone Encounter (Signed)
Pt called with concerns about receiving the iron infusion. She stated her pharmacist told her that to make sure that she "did not receive any blood because sometimes they will give iron with blood". Assured her that Dr. Myna Hidalgo has notated in her chart that she is a Jehovah's Witness. Explained that the iron has no blood products in it. She also wanted to know why the appt would be an hour. Went step by step through the appt procedure and the reasoning behind it. She then stated that she was a little taken back when she was called with the appt because no one had let her know. Reviewed the notes, explained that JoEllen had left her a message on her personal voicemail on 5/23 at 0920. She verbalized understanding and stated she felt better knowing that she would not be receiving or have the possibility of receiving blood as her pharmacist had told her.

## 2012-04-15 ENCOUNTER — Encounter: Payer: Self-pay | Admitting: Internal Medicine

## 2012-04-15 ENCOUNTER — Ambulatory Visit (INDEPENDENT_AMBULATORY_CARE_PROVIDER_SITE_OTHER): Payer: Medicare Other | Admitting: Internal Medicine

## 2012-04-15 VITALS — BP 118/68 | HR 73 | Temp 98.5°F | Resp 20

## 2012-04-15 DIAGNOSIS — L309 Dermatitis, unspecified: Secondary | ICD-10-CM

## 2012-04-15 DIAGNOSIS — L259 Unspecified contact dermatitis, unspecified cause: Secondary | ICD-10-CM

## 2012-04-15 MED ORDER — FLUOCINONIDE 0.05 % EX CREA: TOPICAL_CREAM | Freq: Two times a day (BID) | CUTANEOUS | Status: DC

## 2012-04-18 DIAGNOSIS — L309 Dermatitis, unspecified: Secondary | ICD-10-CM | POA: Insufficient documentation

## 2012-04-18 NOTE — Progress Notes (Signed)
  Subjective:    Patient ID: [name], female    DOB: 01/31/1941, 71 y.o.   MRN: [id number]  HPI Pt presents to clinic for evaluation of ear irritation. Notes two week h/o right ear outer canal irritation and itching. No injury, fever or chills. Has had slight drainage described as clear. Using elocon without resolution. No other alleviating or exacerbating factors.   Past Medical History  Diagnosis Date  . Hyperplastic colon polyp   . Hypertension   . Diabetes mellitus type II   . Osteoarthritis   . GERD (gastroesophageal reflux disease)   . Osteopenia   . Lupus     of skin see dermatologist frequently  . 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 OA (osteoarthritis of spine)     C-spine  . DVT (deep venous thrombosis)     right leg DVT 05-17-2008  . Leg swelling     left leg 2-10, u/s showed a old clot, has a hypercoag panel,  . History of cardiovascular stress test     10/09 had CP, stress test neg, likely GI (GERD)-09/2006-had CP: stress test (-)  . 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 DVT (deep vein thrombosis) in pregnancy   . DVT of leg (deep venous thrombosis) 07/28/2011  . 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 Cholecystectomy   . Breast biopsy     left breast  . Breast lumpectomy     left breast-ductal carcinoma in situ  . Appendectomy   . Skin cancer excision     from back     reports that she has never smoked. She has never used smokeless tobacco. She reports that she does not drink alcohol or use illicit drugs. family history includes Diabetes in her mother and unspecified family member.  There is no history of Heart attack, and Colon cancer, and Breast cancer, . Allergies  Allergen Reactions  . Fluconazole Other (See Comments)    REACTION: severe fatigue and muscle weakness  . Metformin Diarrhea  . Pioglitazone Other (See Comments)      REACTION: wt gain  . Vancomycin     Erythematous rash, hands and toes became cyanotic.       Review of Systems see hpi     Objective:   Physical Exam  Nursing note and vitals reviewed. Constitutional: She appears well-developed and well-nourished. No distress.  HENT:  Head: Normocephalic and atraumatic.  Right Ear: External ear normal.  Left Ear: External ear normal.       Right ear outer canal- dry/flaky skin. No wound, vesicles or drainage.  Neurological: She is alert.  Skin: She is not diaphoretic.  Psychiatric: She has a normal mood and affect.          Assessment & Plan:

## 2012-04-18 NOTE — Assessment & Plan Note (Signed)
Change elocon to lidex. Consider dermatology if fails to resolve

## 2012-04-19 ENCOUNTER — Ambulatory Visit: Payer: Medicare Other

## 2012-04-19 ENCOUNTER — Encounter: Payer: Self-pay | Admitting: Family

## 2012-04-19 ENCOUNTER — Ambulatory Visit (INDEPENDENT_AMBULATORY_CARE_PROVIDER_SITE_OTHER): Payer: Medicare Other | Admitting: Family

## 2012-04-19 VITALS — BP 110/58 | HR 79 | Temp 98.2°F | Resp 16 | Ht 67.0 in | Wt 203.1 lb

## 2012-04-19 DIAGNOSIS — D649 Anemia, unspecified: Secondary | ICD-10-CM

## 2012-04-19 DIAGNOSIS — R079 Chest pain, unspecified: Secondary | ICD-10-CM

## 2012-04-19 DIAGNOSIS — R0789 Other chest pain: Secondary | ICD-10-CM

## 2012-04-19 DIAGNOSIS — G4733 Obstructive sleep apnea (adult) (pediatric): Secondary | ICD-10-CM

## 2012-04-19 DIAGNOSIS — I10 Essential (primary) hypertension: Secondary | ICD-10-CM

## 2012-04-19 DIAGNOSIS — Z7901 Long term (current) use of anticoagulants: Secondary | ICD-10-CM

## 2012-04-19 NOTE — Assessment & Plan Note (Signed)
BP Readings from Last 3 Encounters:  04/19/12 110/58  04/15/12 118/68  04/06/12 150/72   Pts bp looks stable on current meds. Continue same.

## 2012-04-19 NOTE — Assessment & Plan Note (Signed)
Deteriorated.  She has had multiple hypoglycemic events with sugars into the 40's. She reports that she has often been taking her meal coverage several hours after the meal.   I reinforced the need to take meal coverage prior to eating as this can cause unsafe blood sugar drop.  In the meantime, will decrease lantus from 40 u to 35 units. I advised pt to notify Dr. Allena Katz of her recent symptoms and lantus change.

## 2012-04-19 NOTE — Assessment & Plan Note (Signed)
I believe that this is the cause for the patient's sleepiness.  She declines CPAP, declines referral to pulmonary for further evaluation.  We did discuss risks associated with untreated sleep apnea.

## 2012-04-19 NOTE — Assessment & Plan Note (Signed)
She reports intermittent chest pain. EKG is performed today and is unremarkable.  She was seen in the ED for same symptoms and ruled out for MI.

## 2012-04-19 NOTE — Assessment & Plan Note (Signed)
She continues lifelong coumadin. This is managed by coumadin clinic.

## 2012-04-19 NOTE — Progress Notes (Signed)
Subjective:    Patient ID: [name], female    DOB: Aug 04, 1941, 71 y.o.   MRN: [id number]  HPI  [name] 71 yr old female who presents today for follow up.  1) DM2- She reports that sugar this AM was 195.  She is following with Dr. Allena Katz.  She is using lantus 40 units HS and novolog SS TID. She reports one episode of hypoglycemia 45- during the day.   2) HTN-she denies shortness of breath.   3) PE- following with coumadin clinic.  Stable.    4) Fatigue- notes that this seems worse since allergies this. She declined  Review of Systems See HPI  Past Medical History  Diagnosis Date  . Hyperplastic colon polyp   . Hypertension   . Diabetes mellitus type II   . Osteoarthritis   . GERD (gastroesophageal reflux disease)   . Osteopenia   . Lupus     of skin see dermatologist frequently  . 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 OA (osteoarthritis of spine)     C-spine  . DVT (deep venous thrombosis)     right leg DVT 05-17-2008  . Leg swelling     left leg 2-10, u/s showed a old clot, has a hypercoag panel,  . History of cardiovascular stress test     10/09 had CP, stress test neg, likely GI (GERD)-09/2006-had CP: stress test (-)  . 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 DVT (deep vein thrombosis) in pregnancy   . DVT of leg (deep venous thrombosis) 07/28/2011  . 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 Marital Status: Widowed    Spouse Name: N/A    Number of Children: 3  . Years of Education: N/A   Occupational History  . Retired    Social History Main Topics  . Smoking status: Never Smoker   . Smokeless tobacco: Never Used  . Alcohol Use: No  . Drug Use: No  . Sexually Active: Not Currently   Other Topics Concern  . Not on file   Social History Narrative   Single-widow 3 children , 1 in [place] (son, daughter - [place]) [place] to [place] 2002  from [place], [employer] Use - no   tobacco-- never  Illicit Drug Use - no Pt is Jehovah's Witness-No blood productsDaughter - [name]     Past Surgical History  Procedure Date  . Cholecystectomy   . Breast biopsy     left breast  . Breast lumpectomy     left breast-ductal carcinoma in situ  . Appendectomy   . Skin cancer excision     from back     Family History  Problem Relation Age of Onset  . Diabetes Mother   . Diabetes    . Heart attack Neg Hx   . Colon cancer Neg Hx   . Breast cancer Neg Hx     Allergies  Allergen Reactions  . Fluconazole Other (See Comments)    REACTION: severe fatigue and muscle weakness  . Metformin Diarrhea  . Pioglitazone Other (See Comments)    REACTION: wt gain  . Vancomycin     Erythematous rash, hands and toes became cyanotic.      Current Outpatient Prescriptions on File Prior to Visit  Medication Sig Dispense Refill  . Calcium Carbonate-Vitamin D (CALCIUM-VITAMIN D)  600-200 MG-UNIT CAPS Take 1 tablet by mouth 2 (two) times daily.       . fluocinonide cream (LIDEX) 0.05 % Apply topically 2 (two) times daily.  30 g  0  . furosemide (LASIX) 20 MG tablet TAKE 1 TABLET (20 MG TOTAL) BY MOUTH DAILY.  30 tablet  2  . glucosamine-chondroitin 500-400 MG tablet Take 1 tablet by mouth 2 (two) times daily.       Marland Kitchen glucose blood (FREESTYLE LITE) test strip 1 each by Other route as needed. Use as instructed to check blood sugar four times daily (250.00)      . insulin aspart (NOVOLOG) 100 UNIT/ML injection Inject 20-28 Units into the skin 3 (three) times daily.  2 vial  0  . Lactobacillus (ACIDOPHILUS PO) Take 1 tablet by mouth daily.       Marland Kitchen lisinopril (PRINIVIL,ZESTRIL) 40 MG tablet TAKE 1 TABLET (40 MG TOTAL) BY MOUTH DAILY.  90 tablet  0  . loratadine (CLARITIN) 10 MG tablet Take 10 mg by mouth daily.      . niacin 500 MG tablet Take 500 mg by mouth 2 (two) times daily.       Marland Kitchen nystatin-triamcinolone ointment (MYCOLOG) Apply topically 2 (two)  times daily.  15 g  0  . warfarin (COUMADIN) 5 MG tablet Take 1 1/2 tablets on Monday thru Friday, and 1 tablet on Saturday and Sunday.      Marland Kitchen DISCONTD: insulin glargine (LANTUS) 100 UNIT/ML injection Inject 40 Units into the skin at bedtime.  6 mL  0  . Insulin Pen Needle (PEN NEEDLES 29GX1/2") 29G X MISC Use for lantus solostar pen.  100 each  0    BP 110/58  Pulse 79  Temp(Src) 98.2 F (36.8 C) (Oral)  Resp 16  Ht 5\' 7"  (1.702 m)  Wt 203 lb 1.3 oz (92.116 kg)  BMI 31.81 kg/m2  SpO2 98%       Objective:   Physical Exam  Constitutional: She appears well-developed and well-nourished. No distress.  Cardiovascular: Normal rate and regular rhythm.   No murmur heard. Pulmonary/Chest: Effort normal and breath sounds normal. No respiratory distress. She has no wheezes. She has no rales. She exhibits no tenderness.  Musculoskeletal:       1-2+ bilateral LE edema.   Skin: Skin is warm and dry.  Psychiatric: She has a normal mood and affect. Her behavior is normal. Judgment and thought content normal.          Assessment & Plan:

## 2012-04-19 NOTE — Patient Instructions (Signed)
You will be contacted about your stress test. Please follow up in 1 month. Go to the ER if you develop recurrent chest pain that does not resolve on its own.

## 2012-04-19 NOTE — Assessment & Plan Note (Signed)
She reports that she has been intolerant to oral iron supplements.  Iron level was low in Dr. Gustavo Lah office.  She is scheduled to have IV iron infusion.  We discussed this today at her visit and questions were answered.  I doubt that her mild iron deficiency is the cause for her drowsiness though.

## 2012-04-21 ENCOUNTER — Telehealth: Payer: Self-pay | Admitting: Family

## 2012-04-21 NOTE — Telephone Encounter (Signed)
Patient states that she has canceled both the stress test and sleep apnea. She says that she has done both tests before and did not like them.

## 2012-04-22 ENCOUNTER — Telehealth: Payer: Self-pay | Admitting: Hematology & Oncology

## 2012-04-22 ENCOUNTER — Ambulatory Visit: Payer: Medicare Other

## 2012-04-22 NOTE — Telephone Encounter (Signed)
Pt called cancelled 6-7 iron infusion that she didn't feel right about getting it. She said she would come for 7-1 MD appointment. RN aware

## 2012-04-23 ENCOUNTER — Ambulatory Visit: Payer: Medicare Other

## 2012-05-03 ENCOUNTER — Other Ambulatory Visit: Payer: Self-pay | Admitting: Family

## 2012-05-17 ENCOUNTER — Telehealth: Payer: Self-pay | Admitting: Hematology & Oncology

## 2012-05-17 ENCOUNTER — Other Ambulatory Visit (HOSPITAL_BASED_OUTPATIENT_CLINIC_OR_DEPARTMENT_OTHER): Payer: Medicare Other | Admitting: Lab

## 2012-05-17 ENCOUNTER — Other Ambulatory Visit: Payer: Self-pay | Admitting: Hematology & Oncology

## 2012-05-17 ENCOUNTER — Ambulatory Visit (HOSPITAL_BASED_OUTPATIENT_CLINIC_OR_DEPARTMENT_OTHER): Payer: Medicare Other | Admitting: Hematology & Oncology

## 2012-05-17 VITALS — BP 126/58 | HR 81 | Temp 98.7°F | Ht 67.0 in | Wt 205.0 lb

## 2012-05-17 DIAGNOSIS — I82409 Acute embolism and thrombosis of unspecified deep veins of unspecified lower extremity: Secondary | ICD-10-CM

## 2012-05-17 DIAGNOSIS — D509 Iron deficiency anemia, unspecified: Secondary | ICD-10-CM

## 2012-05-17 DIAGNOSIS — I82402 Acute embolism and thrombosis of unspecified deep veins of left lower extremity: Secondary | ICD-10-CM

## 2012-05-17 DIAGNOSIS — I2699 Other pulmonary embolism without acute cor pulmonale: Secondary | ICD-10-CM

## 2012-05-17 DIAGNOSIS — M549 Dorsalgia, unspecified: Secondary | ICD-10-CM

## 2012-05-17 DIAGNOSIS — D649 Anemia, unspecified: Secondary | ICD-10-CM

## 2012-05-17 DIAGNOSIS — M545 Low back pain, unspecified: Secondary | ICD-10-CM

## 2012-05-17 DIAGNOSIS — R609 Edema, unspecified: Secondary | ICD-10-CM

## 2012-05-17 LAB — IRON AND TIBC
%SAT: 18 % — ABNORMAL LOW (ref 20–55)
Iron: 58 ug/dL (ref 42–145)

## 2012-05-17 LAB — CBC WITH DIFFERENTIAL (CANCER CENTER ONLY)
BASO%: 0.3 % (ref 0.0–2.0)
EOS%: 1.3 % (ref 0.0–7.0)
LYMPH%: 23.6 % (ref 14.0–48.0)
MCHC: 32.5 g/dL (ref 32.0–36.0)
MCV: 83 fL (ref 81–101)
MONO#: 0.5 10*3/uL (ref 0.1–0.9)
Platelets: 176 10*3/uL (ref 145–400)
RDW: 14.1 % (ref 11.1–15.7)
WBC: 8 10*3/uL (ref 3.9–10.0)

## 2012-05-17 MED ORDER — TRAMADOL HCL 50 MG PO TABS: 50.0000 mg | ORAL_TABLET | Freq: Four times a day (QID) | ORAL | Status: AC | PRN

## 2012-05-17 NOTE — Progress Notes (Signed)
This office note has been dictated.

## 2012-05-17 NOTE — Telephone Encounter (Signed)
Patient refused doppler anytime before 8-1 she is going out of town 7-10 but cannot come before then. She is aware of 06-17-12 doppler, iron and 8-29 MD. Dr. Myna Hidalgo aware.

## 2012-05-18 NOTE — Progress Notes (Signed)
CC:   Kelsey Craze, NP  DIAGNOSIS: 1. Recurrent deep venous thrombosis/pulmonary embolism. 2. Insulin-dependent diabetes. 3. History of early stage ductal carcinoma of the left breast in 2003.  CURRENT THERAPY:  Coumadin to maintain INR between 2-3.  INTERIM HISTORY:  [name] comes in for followup.  She is not feeling well.  She is still tired.  When we saw her back in May, we had set her up with iron.  Apparently she talked to a pharmacist who said that there could be blood in the iron that we could give her.  I cannot believe a pharmacist would say something as idiotic as that.  Because of that, she did not get her iron infusion.  I talked to her about this.  I told her that there was absolutely no way there was blood in iron infusions.  As such, she will agree to have the iron.  She wants to have it after she gets back from [place].  She goes down to [place] for 2 weeks.  Sister apparently was diagnosed with lung cancer.  She complaints of her back.  She has arthritis in her back.  She sees no one for her back issues.  [name] I think has been helping out with this.  [name] will not take nonsteroidals because she is on Coumadin.  I went ahead and sent in a prescription for Ultram (50 mg p.o. q.6 hours p.r.n.)  for her back.  She does complain of some increased pain in the left leg.  She has noticed that it is a little bit more swollen.  She has had Dopplers in the past of her legs.  I think the last Doppler was back in December which looked okay.  Her last CT angiogram of the chest was back in April.  She was complaining of chest pain.  The CT angio did not show any residual pulmonary embolism.  She does have diabetes.  She is on insulin.  She is on several other medications.  PHYSICAL EXAMINATION:  General:  This is a fairly well-developed, well- nourished, white female in no obvious distress.  Vital signs: Temperature of 98.7, pulse 81,  respiratory rate 18, blood pressure 126/58.  Weight is 205.  Head and neck:  Normocephalic, atraumatic skull.  There are no ocular or oral lesions.  There are no palpable cervical or supraclavicular lymph nodes.  Lungs:  Clear bilaterally. Cardiac:  Regular rate and rhythm with a normal S1 and S2.  There are no murmurs, rubs or bruits.  Abdomen:  Soft with good bowel sounds.  She is mildly obese.  She has no fluid wave.  There is no palpable hepatosplenomegaly.  Back:  Shows some slight tenderness in the lumbosacral spine.  Some of this is localized to the right sacroiliac area.  Extremities:  Shows some stasis dermatitis in the left lower leg. 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 Right leg has some mild pitting edema.  Neurological:  Shows no focal neurological deficits.  LABORATORY STUDIES:  White cell count is 8, hemoglobin 12.4, hematocrit 38.1, platelet count 176.  MCV is 83.  IMPRESSION:  [name] is a 71 year old white female with history of recurrent DVT/PE.  Her hypercoagulable studies have all been negative.  We will repeat Dopplers of her legs.  I do not think that she has any recurrence of a DVT.  Unfortunately, she does not wear her compression stocking for the left leg as she  should.  She says she just wears it one day a week.  I told her that she had to wear it when she drove down to [place] to see her sister.  I also encouraged her to wear it every day, and taken it off at night time.  She will have the iron.  Again, we will set this up for her when she gets back from her trip to [place] in early August.  I will plan to see her back myself probably about a month after she has the iron.  I suspect that some of her fatigue is from the diabetes.  This I think will be hard to overcome.    ______________________________ Josph Macho, M.D. PRE/MEDQ  D:  05/17/2012  T:  05/18/2012  Job:  [id number]

## 2012-05-24 ENCOUNTER — Ambulatory Visit (HOSPITAL_COMMUNITY)
Admission: RE | Admit: 2012-05-24 | Discharge: 2012-05-24 | Disposition: A | Discharge status: Home / Self Care | Payer: Medicare Other | Source: Ambulatory Visit | Attending: Family | Admitting: Family

## 2012-05-24 ENCOUNTER — Encounter: Payer: Self-pay | Admitting: Family

## 2012-05-24 ENCOUNTER — Ambulatory Visit (INDEPENDENT_AMBULATORY_CARE_PROVIDER_SITE_OTHER): Payer: Medicare Other | Admitting: Family

## 2012-05-24 VITALS — BP 142/60 | HR 82 | Temp 98.0°F | Resp 16 | Ht 67.0 in | Wt 204.0 lb

## 2012-05-24 DIAGNOSIS — IMO0001 Reserved for inherently not codable concepts without codable children: Secondary | ICD-10-CM

## 2012-05-24 DIAGNOSIS — D649 Anemia, unspecified: Secondary | ICD-10-CM

## 2012-05-24 DIAGNOSIS — M545 Low back pain, unspecified: Secondary | ICD-10-CM | POA: Insufficient documentation

## 2012-05-24 DIAGNOSIS — E119 Type 2 diabetes mellitus without complications: Secondary | ICD-10-CM

## 2012-05-24 DIAGNOSIS — M549 Dorsalgia, unspecified: Secondary | ICD-10-CM

## 2012-05-24 MED ORDER — NYSTATIN-TRIAMCINOLONE 100000-0.1 UNIT/GM-% EX OINT: TOPICAL_OINTMENT | Freq: Two times a day (BID) | CUTANEOUS | Status: DC

## 2012-05-24 MED ORDER — INSULIN GLARGINE 100 UNIT/ML ~~LOC~~ SOLN: 40.0000 [IU] | Freq: Every day | SUBCUTANEOUS | Status: DC

## 2012-05-24 MED ORDER — LISINOPRIL 40 MG PO TABS: 40.0000 mg | ORAL_TABLET | Freq: Every day | ORAL | Status: DC

## 2012-05-24 NOTE — Assessment & Plan Note (Signed)
Deteriorated.  Will check x-ray of Lspine.

## 2012-05-24 NOTE — Assessment & Plan Note (Signed)
She will start iv iron with Dr. Myna Hidalgo after she returns from her trip to [facility].

## 2012-05-24 NOTE — Progress Notes (Signed)
Subjective:    Patient ID: [name], female    DOB: 26-Feb-1941, 71 y.o.   MRN: [id number]  HPI  Pt presents today for follow up.  1) DM2- She reports that she has had some hypoglycemia- lowest 50.  She reports that she is often low in the morning.  Also low in the morning- just before noon.    2) Anemia-She plans to start IV iron with Dr. Myna Hidalgo.    3) Fatigue- Reports that her fatigue continues.    4) Back pain- constant- lower back, constant.  Did not start tramadol which Dr. Myna Hidalgo gave her as "you gave it to me and it didn't help."  Review of Systems See HPI  Past Medical History  Diagnosis Date  . Hyperplastic colon polyp   . Hypertension   . Diabetes mellitus type II   . Osteoarthritis   . GERD (gastroesophageal reflux disease)   . Osteopenia   . Lupus     of skin see dermatologist frequently  . 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 OA (osteoarthritis of spine)     C-spine  . DVT (deep venous thrombosis)     right leg DVT 05-17-2008  . Leg swelling     left leg 2-10, u/s showed a old clot, has a hypercoag panel,  . History of cardiovascular stress test     10/09 had CP, stress test neg, likely GI (GERD)-09/2006-had CP: stress test (-)  . 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 DVT (deep vein thrombosis) in pregnancy   . DVT of leg (deep venous thrombosis) 07/28/2011  . 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 Marital Status: Widowed    Spouse Name: N/A    Number of Children: 3  . Years of Education: N/A   Occupational History  . Retired    Social History Main Topics  . Smoking status: Never Smoker   . Smokeless tobacco: Never Used  . Alcohol Use: No  . Drug Use: No  . Sexually Active: Not Currently   Other Topics Concern  . Not on file   Social History Narrative   Single-widow 3 children , 1 in [place] (son, daughter - )  [place] to [place] 2002 from [place], [employer] Use - no   tobacco-- never  Illicit Drug Use - no Pt is Jehovah's Witness-No blood productsDaughter - [name]     Past Surgical History  Procedure Date  . Cholecystectomy   . Breast biopsy     left breast  . Breast lumpectomy     left breast-ductal carcinoma in situ  . Appendectomy   . Skin cancer excision     from back     Family History  Problem Relation Age of Onset  . Diabetes Mother   . Diabetes    . Heart attack Neg Hx   . Colon cancer Neg Hx   . Breast cancer Neg Hx     Allergies  Allergen Reactions  . Fluconazole Other (See Comments)    REACTION: severe fatigue and muscle weakness  . Metformin Diarrhea  . Pioglitazone Other (See Comments)    REACTION: wt gain  . Vancomycin     Erythematous rash, hands and toes became cyanotic.      Current Outpatient Prescriptions on File Prior to Visit  Medication Sig Dispense Refill  .  Calcium Carbonate-Vitamin D (CALCIUM-VITAMIN D) 600-200 MG-UNIT CAPS Take 1 tablet by mouth 2 (two) times daily.       . Cholecalciferol (VITAMIN D3) LIQD Take 35,000 Units by mouth. 2 drops at bedtime      . fluocinonide cream (LIDEX) 0.05 % Apply topically 2 (two) times daily.  30 g  0  . furosemide (LASIX) 20 MG tablet TAKE 1 TABLET (20 MG TOTAL) BY MOUTH DAILY.  30 tablet  1  . glucosamine-chondroitin 500-400 MG tablet Take 1 tablet by mouth 2 (two) times daily.       . insulin aspart (NOVOLOG) 100 UNIT/ML injection Inject 20-28 Units into the skin 3 (three) times daily.  2 vial  0  . Lactobacillus (ACIDOPHILUS PO) Take 1 tablet by mouth daily.       . niacin 500 MG tablet Take 500 mg by mouth 2 (two) times daily.       . NON FORMULARY Take 1 capsule by mouth daily. [employer] up with Vit B12 and Ginseng (for energy)      . traMADol (ULTRAM) 50 MG tablet Take 1 tablet (50 mg total) by mouth every 6 (six) hours as needed for pain.  90 tablet  3  . warfarin (COUMADIN) 5 MG tablet Take 1 1/2  tablets on Monday thru Friday, and 1 tablet on Saturday and Sunday.      Marland Kitchen DISCONTD: insulin glargine (LANTUS) 100 UNIT/ML injection Inject 40 Units into the skin at bedtime.       Marland Kitchen DISCONTD: lisinopril (PRINIVIL,ZESTRIL) 40 MG tablet TAKE 1 TABLET (40 MG TOTAL) BY MOUTH DAILY.  90 tablet  0    BP 142/60  Pulse 82  Temp 98 F (36.7 C) (Oral)  Resp 16  Ht 5\' 7"  (1.702 m)  Wt 204 lb 0.6 oz (92.552 kg)  BMI 31.96 kg/m2  SpO2 99%       Objective:   Physical Exam  Constitutional: She appears well-developed and well-nourished. No distress.  HENT:  Head: Normocephalic and atraumatic.  Cardiovascular: Normal rate and regular rhythm.   No murmur heard. Pulmonary/Chest: Effort normal and breath sounds normal. No respiratory distress. She has no wheezes. She has no rales. She exhibits no tenderness.  Musculoskeletal:       2+ bilateral LE edema.   Psychiatric: She has a normal mood and affect. Her behavior is normal. Judgment and thought content normal.          Assessment & Plan:

## 2012-05-24 NOTE — Assessment & Plan Note (Signed)
She reports episodes of hypoglycemia.  Will decrease lantus from 40 units to 38 units.  Lantus pen samples were provided to patient today.

## 2012-05-24 NOTE — Patient Instructions (Signed)
Please complete your x-ray on the first floor.  Follow up in 3 months, sooner if problems/concerns.

## 2012-05-25 ENCOUNTER — Ambulatory Visit (HOSPITAL_COMMUNITY): Payer: Medicare Other

## 2012-05-25 ENCOUNTER — Telehealth: Payer: Self-pay | Admitting: *Deleted

## 2012-05-25 ENCOUNTER — Encounter: Payer: Self-pay | Admitting: Family

## 2012-05-25 ENCOUNTER — Encounter (HOSPITAL_COMMUNITY): Payer: Medicare Other

## 2012-05-25 LAB — MICROALBUMIN / CREATININE URINE RATIO
Creatinine, Urine: 174.2 mg/dL
Microalb Creat Ratio: 9.1 mg/g (ref 0.0–30.0)
Microalb, Ur: 1.58 mg/dL (ref 0.00–1.89)

## 2012-05-25 NOTE — Telephone Encounter (Signed)
Called patient to let her know that her iron studies were low per dr. Myna Hidalgo. Patient already knew this and has appt scheduled for Aug 1.

## 2012-05-25 NOTE — Telephone Encounter (Signed)
Message copied by Anselm Jungling on Tue May 25, 2012  9:55 AM ------      Message from: Arlan Organ R      Created: Mon May 24, 2012  5:59 PM       Please call him and tell her that her iron studies are actually low. She will need a dose of Feraheme at 1020 mg. She had this when she gets back from visiting her sister in [place]. That probably will be in 3 weeks or so. Thanks. Cindee Lame

## 2012-05-28 ENCOUNTER — Telehealth: Payer: Self-pay | Admitting: *Deleted

## 2012-05-28 NOTE — Telephone Encounter (Signed)
Patient notified of Xray results.

## 2012-05-28 NOTE — Telephone Encounter (Signed)
Message copied by Elnora Morrison on Fri May 28, 2012  1:10 PM ------      Message from: [name], MELISSA      Created: Tue May 25, 2012 10:35 AM       Pls call pt and let her know that her x-ray shows degenerative discs in her lower spine.  If she is interested when she returns, we can consider sending her for PT for back pain if she would like.

## 2012-06-11 ENCOUNTER — Telehealth: Payer: Self-pay | Admitting: *Deleted

## 2012-06-11 NOTE — Telephone Encounter (Signed)
Received call from pt stating she was returning my call. Notified pt of lumbar xray results. Pt wishes to think more about PT and will call us back if she wants to proceed. Pt states IF she changes her mind, she would like to be referred to PT outside of this campus.  Pt will call back if she decides to proceed with referral.

## 2012-06-17 ENCOUNTER — Ambulatory Visit (HOSPITAL_BASED_OUTPATIENT_CLINIC_OR_DEPARTMENT_OTHER)
Admission: RE | Admit: 2012-06-17 | Discharge: 2012-06-17 | Disposition: A | Discharge status: Home / Self Care | Payer: Medicare Other | Source: Ambulatory Visit | Attending: Hematology & Oncology | Admitting: Hematology & Oncology

## 2012-06-17 ENCOUNTER — Ambulatory Visit (HOSPITAL_BASED_OUTPATIENT_CLINIC_OR_DEPARTMENT_OTHER): Payer: Medicare Other

## 2012-06-17 VITALS — BP 130/82 | HR 82 | Temp 97.0°F

## 2012-06-17 DIAGNOSIS — Z86718 Personal history of other venous thrombosis and embolism: Secondary | ICD-10-CM | POA: Insufficient documentation

## 2012-06-17 DIAGNOSIS — R609 Edema, unspecified: Secondary | ICD-10-CM | POA: Insufficient documentation

## 2012-06-17 DIAGNOSIS — D649 Anemia, unspecified: Secondary | ICD-10-CM

## 2012-06-17 MED ORDER — SODIUM CHLORIDE 0.9 % IV SOLN
1020.0000 mg | Freq: Once | INTRAVENOUS | Status: AC
  Administered 2012-06-17: 1020 mg via INTRAVENOUS
  Filled 2012-06-17: qty 34

## 2012-06-17 NOTE — Patient Instructions (Signed)
Ferumoxytol injection What is this medicine? FERUMOXYTOL is an iron complex. Iron is used to make healthy red blood cells, which carry oxygen and nutrients throughout the body. This medicine is used to treat iron deficiency anemia in people with chronic kidney disease. This medicine may be used for other purposes; ask your health care provider or pharmacist if you have questions. What should I tell my health care provider before I take this medicine? They need to know if you have any of these conditions: -anemia not caused by low iron levels -high levels of iron in the blood -magnetic resonance imaging (MRI) test scheduled -an unusual or allergic reaction to iron, other medicines, foods, dyes, or preservatives -pregnant or trying to get pregnant -breast-feeding How should I use this medicine? This medicine is for infusion into a vein. It is given by a health care professional in a hospital or clinic setting. Talk to your pediatrician regarding the use of this medicine in children. Special care may be needed. Overdosage: If you think you've taken too much of this medicine contact a poison control center or emergency room at once. Overdosage: If you think you have taken too much of this medicine contact a poison control center or emergency room at once. NOTE: This medicine is only for you. Do not share this medicine with others. What if I miss a dose? It is important not to miss your dose. Call your doctor or health care professional if you are unable to keep an appointment. What may interact with this medicine? This medicine may interact with the following medications: -other iron products This list may not describe all possible interactions. Give your health care provider a list of all the medicines, herbs, non-prescription drugs, or dietary supplements you use. Also tell them if you smoke, drink alcohol, or use illegal drugs. Some items may interact with your medicine. What should I watch  for while using this medicine? Visit your doctor or healthcare professional regularly. Tell your doctor or healthcare professional if your symptoms do not start to get better or if they get worse. You may need blood work done while you are taking this medicine. You may need to follow a special diet. Talk to your doctor. Foods that contain iron include: whole grains/cereals, dried fruits, beans, or peas, leafy green vegetables, and organ meats (liver, kidney). What side effects may I notice from receiving this medicine? Side effects that you should report to your doctor or health care professional as soon as possible: -allergic reactions like skin rash, itching or hives, swelling of the face, lips, or tongue -breathing problems -changes in blood pressure -feeling faint or lightheaded, falls -fever or chills -flushing, sweating, or hot feelings -swelling of the ankles or feet Side effects that usually do not require medical attention (Report these to your doctor or health care professional if they continue or are bothersome.): -diarrhea -headache -nausea, vomiting -stomach pain This list may not describe all possible side effects. Call your doctor for medical advice about side effects. You may report side effects to FDA at 1-800-FDA-1088. Where should I keep my medicine? This drug is given in a hospital or clinic and will not be stored at home. NOTE: This sheet is a summary. It may not cover all possible information. If you have questions about this medicine, talk to your doctor, pharmacist, or health care provider.  2012, Elsevier/Gold Standard. (07/26/2008 9:48:25 PM) 

## 2012-07-02 ENCOUNTER — Other Ambulatory Visit: Payer: Self-pay | Admitting: Family

## 2012-07-15 ENCOUNTER — Ambulatory Visit (HOSPITAL_BASED_OUTPATIENT_CLINIC_OR_DEPARTMENT_OTHER): Payer: Medicare Other | Admitting: Hematology & Oncology

## 2012-07-15 ENCOUNTER — Other Ambulatory Visit (HOSPITAL_BASED_OUTPATIENT_CLINIC_OR_DEPARTMENT_OTHER): Payer: Medicare Other | Admitting: Lab

## 2012-07-15 VITALS — BP 153/62 | HR 67 | Temp 98.6°F | Resp 20 | Ht 67.0 in | Wt 205.0 lb

## 2012-07-15 DIAGNOSIS — E119 Type 2 diabetes mellitus without complications: Secondary | ICD-10-CM

## 2012-07-15 DIAGNOSIS — I2699 Other pulmonary embolism without acute cor pulmonale: Secondary | ICD-10-CM

## 2012-07-15 DIAGNOSIS — I82409 Acute embolism and thrombosis of unspecified deep veins of unspecified lower extremity: Secondary | ICD-10-CM

## 2012-07-15 DIAGNOSIS — Z7901 Long term (current) use of anticoagulants: Secondary | ICD-10-CM

## 2012-07-15 DIAGNOSIS — D509 Iron deficiency anemia, unspecified: Secondary | ICD-10-CM

## 2012-07-15 LAB — CBC WITH DIFFERENTIAL (CANCER CENTER ONLY)
BASO#: 0 10*3/uL (ref 0.0–0.2)
EOS%: 1.3 % (ref 0.0–7.0)
HCT: 36.9 % (ref 34.8–46.6)
HGB: 12.3 g/dL (ref 11.6–15.9)
LYMPH#: 2.5 10*3/uL (ref 0.9–3.3)
LYMPH%: 31.3 % (ref 14.0–48.0)
MCH: 27.7 pg (ref 26.0–34.0)
MCHC: 33.3 g/dL (ref 32.0–36.0)
MCV: 83 fL (ref 81–101)
MONO%: 9.6 % (ref 0.0–13.0)
NEUT%: 57.5 % (ref 39.6–80.0)
RDW: 14.4 % (ref 11.1–15.7)

## 2012-07-15 NOTE — Progress Notes (Signed)
This office note has been dictated.

## 2012-07-15 NOTE — Progress Notes (Signed)
CC:   Sandford Craze, NP  DIAGNOSES: 1. Recurrent deep venous thrombosis/pulmonary embolism. 2. History of ductal carcinoma the  left breast, 2003. 3. Insulin-dependent diabetes. 4. Iron deficiency anemia.  CURRENT THERAPY: 1. Coumadin to maintain INR between 2-3. 2. IV iron as indicated.  INTERIM HISTORY:  [name] came in for followup.  She still feels tired.  We did go ahead and give her a dose of IV iron.  I thought this would make her feel better.  She got the iron back on August 1st.  She got 1020 mg.  She said this really did not make her feel better.  She does have sleep apnea.  She does not take anything for this.  She does not want to take anything for this.  I believe the sleep apnea probably is going to end up being the cause for her not feeling well.  She did have a Doppler done of her left leg.  This was done on August 1st.  The Doppler did not show any evidence of residual DVT.  She does have some chronic stasis dermatitis changes and some post-phlebitic changes in the left leg.  She has had a compression stocking that she does not like to wear.  She does have bone issues.  She has arthritic issues.  She is afraid to take Motrin.  I told her that she could certainly take Motrin 1 or 2 times a day if needed.  As long as she takes Motrin with food, I think her risk of bleeding is going to be low.  PHYSICAL EXAMINATION:  General:  This is a well-developed, well- nourished, white female in no obvious distress.  Vital Signs:  98.6, pulse 67, respiratory rate 20, blood pressure 153/60.  Weight is 205. Head and neck:  Normocephalic, atraumatic skull.  There are no ocular or oral lesions.  Lymph:  There are no palpable cervical or supraclavicular lymph nodes.  Lungs:  Clear to percussion and auscultation bilaterally. Cardiac:  Regular rate and rhythm with a normal S1, S2.  There are no murmurs, rubs, or bruits.  Abdomen:  Soft with good bowel sounds.  There is  no fluid wave.  There is no palpable abdominal mass.  There is no palpable hepatosplenomegaly.  Back.  Back:  No tenderness over the spine, ribs, or hips.  Extremities:  Some stasis dermatitis changes in the left lower leg.  She has some post-phlebitic-type changes in the left lower leg.  She has some chronic swelling in both legs.  LABORATORY STUDIES:  White cell count is 7.9, hemoglobin 12.3, hematocrit 36.9, platelet count 157.  IMPRESSION:  [name] is a 71 year old white female with history of recurrent deep vein thrombosis/pulmonary embolism.  She is on lifelong Coumadin.  Of note, when we did her last Doppler test, she did have a Baker cyst behind the left knee.  I am not sure what needs to be done with this.  She sees Dr. [name] across the hall.  I will let her know what is going and see if she wants to make a referral for orthopedic surgery.  I do not see need for any other studies right now.  We will get her back in 6 weeks.    ______________________________ [name], M.D. PRE/MEDQ  D:  07/15/2012  T:  07/15/2012  Job:  [id number]

## 2012-07-16 LAB — IRON AND TIBC: TIBC: 261 ug/dL (ref 250–470)

## 2012-07-16 LAB — D-DIMER, QUANTITATIVE: D-Dimer, Quant: 0.27 ug/mL-FEU (ref 0.00–0.48)

## 2012-07-26 ENCOUNTER — Telehealth: Payer: Self-pay | Admitting: *Deleted

## 2012-07-26 NOTE — Telephone Encounter (Signed)
Called patient to let her know that her iron levels were much better per dr. Myna Hidalgo.  Fatigue most likely from sleep apnea

## 2012-07-26 NOTE — Telephone Encounter (Signed)
Message copied by Anselm Jungling on Mon Jul 26, 2012  2:21 PM ------      Message from: Arlan Organ R      Created: Wed Jul 21, 2012  7:17 AM       Call - your iron is much better!!  I suspect the fatigue is from sleep apnea!!!  [name]

## 2012-08-02 ENCOUNTER — Other Ambulatory Visit: Payer: Self-pay | Admitting: Family

## 2012-08-02 DIAGNOSIS — Z1231 Encounter for screening mammogram for malignant neoplasm of breast: Secondary | ICD-10-CM

## 2012-08-04 ENCOUNTER — Encounter: Payer: Self-pay | Admitting: Internal Medicine

## 2012-08-20 ENCOUNTER — Telehealth: Payer: Self-pay | Admitting: Family

## 2012-08-20 MED ORDER — LISINOPRIL 40 MG PO TABS: 40.0000 mg | ORAL_TABLET | Freq: Every day | ORAL | Status: DC

## 2012-08-20 NOTE — Telephone Encounter (Signed)
Refill- lisinopril 40mg  tab. Take one tablet daily. Qty 90 last fill 7.8.13  Pharmacy comments: cycle fill medication. Authorization is required for next refill. No refills available.

## 2012-08-20 NOTE — Telephone Encounter (Signed)
Refill sent to pharmacy.   

## 2012-08-23 ENCOUNTER — Ambulatory Visit (INDEPENDENT_AMBULATORY_CARE_PROVIDER_SITE_OTHER): Payer: Medicare Other | Admitting: Family

## 2012-08-23 ENCOUNTER — Ambulatory Visit (HOSPITAL_BASED_OUTPATIENT_CLINIC_OR_DEPARTMENT_OTHER)
Admission: RE | Admit: 2012-08-23 | Discharge: 2012-08-23 | Disposition: A | Discharge status: Home / Self Care | Payer: Medicare Other | Source: Ambulatory Visit | Attending: Family | Admitting: Family

## 2012-08-23 ENCOUNTER — Encounter: Payer: Self-pay | Admitting: Family

## 2012-08-23 ENCOUNTER — Telehealth: Payer: Self-pay | Admitting: Family

## 2012-08-23 VITALS — BP 132/80 | HR 71 | Temp 98.7°F | Resp 16 | Wt 202.0 lb

## 2012-08-23 DIAGNOSIS — M545 Low back pain, unspecified: Secondary | ICD-10-CM

## 2012-08-23 DIAGNOSIS — E785 Hyperlipidemia, unspecified: Secondary | ICD-10-CM

## 2012-08-23 DIAGNOSIS — R079 Chest pain, unspecified: Secondary | ICD-10-CM

## 2012-08-23 DIAGNOSIS — IMO0001 Reserved for inherently not codable concepts without codable children: Secondary | ICD-10-CM

## 2012-08-23 DIAGNOSIS — H9190 Unspecified hearing loss, unspecified ear: Secondary | ICD-10-CM

## 2012-08-23 DIAGNOSIS — M549 Dorsalgia, unspecified: Secondary | ICD-10-CM

## 2012-08-23 DIAGNOSIS — Z23 Encounter for immunization: Secondary | ICD-10-CM

## 2012-08-23 DIAGNOSIS — Z87898 Personal history of other specified conditions: Secondary | ICD-10-CM

## 2012-08-23 DIAGNOSIS — Z1231 Encounter for screening mammogram for malignant neoplasm of breast: Secondary | ICD-10-CM | POA: Insufficient documentation

## 2012-08-23 DIAGNOSIS — R922 Inconclusive mammogram: Secondary | ICD-10-CM | POA: Insufficient documentation

## 2012-08-23 DIAGNOSIS — D649 Anemia, unspecified: Secondary | ICD-10-CM

## 2012-08-23 DIAGNOSIS — G4733 Obstructive sleep apnea (adult) (pediatric): Secondary | ICD-10-CM

## 2012-08-23 DIAGNOSIS — Z853 Personal history of malignant neoplasm of breast: Secondary | ICD-10-CM | POA: Insufficient documentation

## 2012-08-23 MED ORDER — FUROSEMIDE 20 MG PO TABS: 20.0000 mg | ORAL_TABLET | Freq: Every day | ORAL | Status: DC | PRN

## 2012-08-23 MED ORDER — NYSTATIN 100000 UNIT/GM EX POWD: Freq: Four times a day (QID) | CUTANEOUS | Status: DC

## 2012-08-23 NOTE — Progress Notes (Signed)
Subjective:    Patient ID: [name], female    DOB: 1941/06/02, 71 y.o.   MRN: [id number]  HPI  [name] is a 71 yr old female who presents today for follow up.  1) DM2- last A1C 7/17 was 8.5 (above goal). She is on ACE.  Wants flu shot.  Last eye exam 03/10/11. She is scheduled to see opthalmology next week.   2) Anemia- She follows with Dr. Myna Hidalgo.  Undergoing IV iron infusions.  Continues lifelong coumadin which is monitored at the coumadin clinic.   3) Back pain-x-ray 7/13 of lumbar spine noted multilevel disc disease.  4) DVT/PE- had doppler last visit with Dr. Myna Hidalgo.  Note was made of incidental left baker's cyst.  She continues life long coumadin.  5) hyperlipidemia- has not had lipid panel in >1 year.  Last LDL on file was 125. She is not on a statin.   6) R hearing loss- Reports feeling embarrassed that she cannot hear people talking.    7) Chest pain-  Happens when walking.  Substernal- non-radiating, improves with rest.    Review of Systems See HPI  Past Medical History  Diagnosis Date  . Hyperplastic colon polyp   . Hypertension   . Diabetes mellitus type II   . Osteoarthritis   . GERD (gastroesophageal reflux disease)   . Osteopenia   . Lupus     of skin see dermatologist frequently  . 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 OA (osteoarthritis of spine)     C-spine  . DVT (deep venous thrombosis)     right leg DVT 05-17-2008  . Leg swelling     left leg 2-10, u/s showed a old clot, has a hypercoag panel,  . History of cardiovascular stress test     10/09 had CP, stress test neg, likely GI (GERD)-09/2006-had CP: stress test (-)  . 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 DVT (deep vein thrombosis) in pregnancy   . DVT of leg (deep venous thrombosis) 07/28/2011  . 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 Marital Status: Widowed   Spouse Name: N/A    Number of Children: 3  . Years of Education: N/A   Occupational History  . Retired    Social History Main Topics  . Smoking status: Never Smoker   . Smokeless tobacco: Never Used  . Alcohol Use: No  . Drug Use: No  . Sexually Active: Not Currently   Other Topics Concern  . Not on file   Social History Narrative   Single-widow 3 children , 1 in [place] (son, daughter - [place]) [place] to [place] 2002 from [place], [employer] Use - no   tobacco-- never  Illicit Drug Use - no Pt is Jehovah's Witness-No blood productsDaughter - [name]     Past Surgical History  Procedure Date  . Cholecystectomy   . Breast biopsy     left breast  . Breast lumpectomy     left breast-ductal carcinoma in situ  . Appendectomy   . Skin cancer excision     from back     Family History  Problem Relation Age of Onset  . Diabetes Mother   . Diabetes    . Heart attack Neg Hx   . Colon cancer Neg Hx   . Breast cancer Neg Hx  Allergies  Allergen Reactions  . Fluconazole Other (See Comments)    REACTION: severe fatigue and muscle weakness  . Metformin Diarrhea  . Pioglitazone Other (See Comments)    REACTION: wt gain  . Vancomycin     Erythematous rash, hands and toes became cyanotic.      Current Outpatient Prescriptions on File Prior to Visit  Medication Sig Dispense Refill  . B Complex-C (SUPER B COMPLEX PO) Take 1 tablet by mouth daily.      . Calcium Carbonate-Vitamin D (CALCIUM-VITAMIN D) 600-200 MG-UNIT CAPS Take 1 tablet by mouth 2 (two) times daily.       . Cholecalciferol (VITAMIN D3) LIQD Take 35,000 Units by mouth. 2 drops at bedtime      . fluocinonide cream (LIDEX) 0.05 % Apply topically 2 (two) times daily.  30 g  0  . furosemide (LASIX) 20 MG tablet Take 1 tablet (20 mg total) by mouth daily as needed.  30 tablet  2  . glucosamine-chondroitin 500-400 MG tablet Take 1 tablet by mouth 2 (two) times daily.       Marland Kitchen glucose blood test strip Use to check  blood sugar 4 times a day.      . insulin aspart (NOVOLOG) 100 UNIT/ML injection Inject 20-28 Units into the skin 3 (three) times daily.  2 vial  0  . insulin glargine (LANTUS) 100 UNIT/ML injection Inject 38 Units into the skin at bedtime.      Marland Kitchen lisinopril (PRINIVIL,ZESTRIL) 40 MG tablet Take 1 tablet (40 mg total) by mouth daily.  90 tablet  0  . niacin 500 MG tablet Take 500 mg by mouth 2 (two) times daily.       . NON FORMULARY Take 1 capsule by mouth daily. Super Energy up with Vit B12 and Ginseng (for energy)      . warfarin (COUMADIN) 5 MG tablet Take 1 1/2 tablets on Monday thru Friday, and 1 tablet on Saturday and Sunday.        BP 132/80  Pulse 71  Temp 98.7 F (37.1 C) (Oral)  Resp 16  Wt 202 lb (91.627 kg)  SpO2 97%       Objective:   Physical Exam  Constitutional: She is oriented to person, place, and time. She appears well-developed and well-nourished. No distress.  Eyes: No scleral icterus.  Cardiovascular: Normal rate and regular rhythm.   No murmur heard. Pulmonary/Chest: Effort normal and breath sounds normal. No respiratory distress. She has no wheezes. She has no rales. She exhibits no tenderness.  Neurological: She is alert and oriented to person, place, and time.  Skin: Skin is warm and dry. No rash noted. No erythema. No pallor.  Psychiatric: She has a normal mood and affect. Her behavior is normal. Judgment and thought content normal.          Assessment & Plan:

## 2012-08-23 NOTE — Telephone Encounter (Signed)
Please let pt know that I have place order for her to have a stress test due to her report of chest pain with walking.  Cardiology should contact her with appointment date/time.

## 2012-08-23 NOTE — Telephone Encounter (Signed)
Left message on home # to return my call. 

## 2012-08-23 NOTE — Patient Instructions (Addendum)
You will be contact about your referral to Audiology, Pulmonology, physical therapy. Please let us know if you have not heard back within 1 week about your referral. Please schedule a follow up appointment in 3 months.

## 2012-08-24 NOTE — Telephone Encounter (Signed)
Notified pt. And she voices understanding.

## 2012-08-24 NOTE — Telephone Encounter (Signed)
When pt calls back, please also let her know that there were some calcifications in her right breast on mammogram which the radiologist would like to evaluate more closely. She should be contacted by the breast center about further testing.  Let me know if she has not heard back from them in 1 week.

## 2012-08-25 ENCOUNTER — Ambulatory Visit: Payer: Medicare Other | Admitting: Hematology & Oncology

## 2012-08-25 ENCOUNTER — Other Ambulatory Visit: Payer: Self-pay | Admitting: Family

## 2012-08-25 ENCOUNTER — Other Ambulatory Visit: Payer: Medicare Other | Admitting: Lab

## 2012-08-25 DIAGNOSIS — R928 Other abnormal and inconclusive findings on diagnostic imaging of breast: Secondary | ICD-10-CM

## 2012-08-30 ENCOUNTER — Telehealth: Payer: Self-pay | Admitting: Family

## 2012-08-30 ENCOUNTER — Ambulatory Visit (INDEPENDENT_AMBULATORY_CARE_PROVIDER_SITE_OTHER): Payer: Medicare Other | Admitting: Family

## 2012-08-30 ENCOUNTER — Other Ambulatory Visit: Payer: Self-pay | Admitting: Family

## 2012-08-30 ENCOUNTER — Encounter: Payer: Self-pay | Admitting: Family

## 2012-08-30 ENCOUNTER — Ambulatory Visit (HOSPITAL_BASED_OUTPATIENT_CLINIC_OR_DEPARTMENT_OTHER)
Admission: RE | Admit: 2012-08-30 | Discharge: 2012-08-30 | Disposition: A | Discharge status: Home / Self Care | Payer: Medicare Other | Source: Ambulatory Visit | Attending: Family | Admitting: Family

## 2012-08-30 VITALS — BP 130/64 | HR 70 | Temp 98.6°F | Resp 16 | Wt 202.0 lb

## 2012-08-30 DIAGNOSIS — Z87898 Personal history of other specified conditions: Secondary | ICD-10-CM | POA: Insufficient documentation

## 2012-08-30 DIAGNOSIS — H9190 Unspecified hearing loss, unspecified ear: Secondary | ICD-10-CM | POA: Insufficient documentation

## 2012-08-30 DIAGNOSIS — D649 Anemia, unspecified: Secondary | ICD-10-CM | POA: Insufficient documentation

## 2012-08-30 DIAGNOSIS — R079 Chest pain, unspecified: Secondary | ICD-10-CM | POA: Insufficient documentation

## 2012-08-30 DIAGNOSIS — R0781 Pleurodynia: Secondary | ICD-10-CM

## 2012-08-30 MED ORDER — ALENDRONATE SODIUM 70 MG PO TABS: 70.0000 mg | ORAL_TABLET | ORAL | Status: DC

## 2012-08-30 MED ORDER — TRAMADOL HCL 50 MG PO TABS: 50.0000 mg | ORAL_TABLET | Freq: Three times a day (TID) | ORAL | Status: DC | PRN

## 2012-08-30 NOTE — Assessment & Plan Note (Signed)
Multilevel chronic disc degeneration per plain film performed in July.  Refer for PT.

## 2012-08-30 NOTE — Assessment & Plan Note (Signed)
Pt is following with Dr. Myna Hidalgo and has had IV iron infusion.  Last iron level, H and H were normal.

## 2012-08-30 NOTE — Assessment & Plan Note (Signed)
Plan flp next visit.   

## 2012-08-30 NOTE — Progress Notes (Signed)
Subjective:    Patient ID: [name], female    DOB: 10-29-1941, 71 y.o.   MRN: [id number]  HPI  [name] is a 71 yr old female who presents today with chief complaint of left rib pain.  Pain started 4 days ago after she bent over to tie her shoe.  Reports pain is constant and severe.  "just hurts."  She has not taken anything for pain.  Pain is made worse by deep breath.    Review of Systems    see HPI  Past Medical History  Diagnosis Date  . Hyperplastic colon polyp   . Hypertension   . Diabetes mellitus type II   . Osteoarthritis   . GERD (gastroesophageal reflux disease)   . Osteopenia   . Lupus     of skin see dermatologist frequently  . 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 OA (osteoarthritis of spine)     C-spine  . DVT (deep venous thrombosis)     right leg DVT 05-17-2008  . Leg swelling     left leg 2-10, u/s showed a old clot, has a hypercoag panel,  . History of cardiovascular stress test     10/09 had CP, stress test neg, likely GI (GERD)-09/2006-had CP: stress test (-)  . 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 DVT (deep vein thrombosis) in pregnancy   . DVT of leg (deep venous thrombosis) 07/28/2011  . 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 Marital Status: Widowed    Spouse Name: N/A    Number of Children: 3  . Years of Education: N/A   Occupational History  . Retired    Social History Main Topics  . Smoking status: Never Smoker   . Smokeless tobacco: Never Used  . Alcohol Use: No  . Drug Use: No  . Sexually Active: Not Currently   Other Topics Concern  . Not on file   Social History Narrative   Single-widow 3 children , 1 in [place] (son, Kelsey - [place]) [place] to [place] 2002 from [place], [employer] Use - no   tobacco-- never  Illicit Drug Use - no Pt is Jehovah's Witness-No blood productsDaughter - Vonna [name]      Past Surgical History  Procedure Date  . Cholecystectomy   . Breast biopsy     left breast  . Breast lumpectomy     left breast-ductal carcinoma in situ  . Appendectomy   . Skin cancer excision     from back     Family History  Problem Relation Age of Onset  . Diabetes Mother   . Diabetes    . Heart attack Neg Hx   . Colon cancer Neg Hx   . Breast cancer Neg Hx     Allergies  Allergen Reactions  . Fluconazole Other (See Comments)    REACTION: severe fatigue and muscle weakness  . Metformin Diarrhea  . Pioglitazone Other (See Comments)    REACTION: wt gain  . Vancomycin     Erythematous rash, hands and toes became cyanotic.      Current Outpatient Prescriptions on File Prior to Visit  Medication Sig Dispense Refill  . B Complex-C (SUPER B COMPLEX PO) Take 1 tablet by mouth daily.      . Calcium Carbonate-Vitamin D (CALCIUM-VITAMIN D) 600-200 MG-UNIT CAPS Take  1 tablet by mouth 2 (two) times daily.       . Cholecalciferol (VITAMIN D3) LIQD Take 35,000 Units by mouth. 2 drops at bedtime      . fluocinonide cream (LIDEX) 0.05 % Apply topically 2 (two) times daily.  30 g  0  . furosemide (LASIX) 20 MG tablet Take 1 tablet (20 mg total) by mouth daily as needed.  30 tablet  2  . glucosamine-chondroitin 500-400 MG tablet Take 1 tablet by mouth 2 (two) times daily.       Marland Kitchen glucose blood test strip Use to check blood sugar 4 times a day.      . insulin aspart (NOVOLOG) 100 UNIT/ML injection Inject 20-28 Units into the skin 3 (three) times daily.  2 vial  0  . insulin glargine (LANTUS) 100 UNIT/ML injection Inject 38 Units into the skin at bedtime.      Marland Kitchen lisinopril (PRINIVIL,ZESTRIL) 40 MG tablet Take 1 tablet (40 mg total) by mouth daily.  90 tablet  0  . niacin 500 MG tablet Take 500 mg by mouth 2 (two) times daily.       Marland Kitchen nystatin (MYCOSTATIN) powder Apply topically 4 (four) times daily.  30 g  2  . warfarin (COUMADIN) 5 MG tablet Take 1 1/2 tablets on Monday thru  Friday, and 1 tablet on Saturday and Sunday.      . NON FORMULARY Take 1 capsule by mouth daily. [employer] up with Vit B12 and Ginseng (for energy)        BP 130/64  Pulse 70  Temp 98.6 F (37 C) (Oral)  Resp 16  Wt 202 lb (91.627 kg)  SpO2 99%    Objective:   Physical Exam  Constitutional: She is oriented to person, place, and time. She appears well-developed and well-nourished. No distress.  HENT:  Head: Normocephalic and atraumatic.  Cardiovascular: Normal rate and regular rhythm.   No murmur heard. Pulmonary/Chest: Effort normal and breath sounds normal. No respiratory distress. She has no wheezes. She has no rales. She exhibits no tenderness.  Neurological: She is alert and oriented to person, place, and time.  Psychiatric: She has a normal mood and affect. Her behavior is normal. Judgment and thought content normal.          Assessment & Plan:

## 2012-08-30 NOTE — Assessment & Plan Note (Signed)
Will refer for stress test

## 2012-08-30 NOTE — Assessment & Plan Note (Addendum)
New. Will obtain Rib film to rule out fracture.

## 2012-08-30 NOTE — Assessment & Plan Note (Signed)
Too soon to repeat A1C.  Continue current meds.  Follow up with opthalmology. Flu shot.

## 2012-08-30 NOTE — Assessment & Plan Note (Signed)
She continues to complain about fatigue.  I think her untreated OSA is the culprit.  She is agreeable to meet with pulmonary to discuss CPAP and possibly some different masks which may be more comfortable for her.

## 2012-08-30 NOTE — Assessment & Plan Note (Signed)
Will refer to audiology for further evaluation

## 2012-08-30 NOTE — Patient Instructions (Addendum)
Please complete your x ray on the first floor.  We will contact you with your results.   

## 2012-08-30 NOTE — Telephone Encounter (Signed)
Reviewed X-ray- + rib fractures on left.  Reviewed with pt. Had osteopenia on dexa, but in setting of fracture- will add fosamax.  Pt is instructed to sit upright after taking.  Add tramadol prn pain (pt instructed may cause drowsiness and not to drive after taking).

## 2012-08-31 ENCOUNTER — Ambulatory Visit (INDEPENDENT_AMBULATORY_CARE_PROVIDER_SITE_OTHER): Payer: Medicare Other | Admitting: Pulmonary Disease

## 2012-08-31 ENCOUNTER — Encounter: Payer: Self-pay | Admitting: Pulmonary Disease

## 2012-08-31 VITALS — BP 134/68 | HR 65 | Temp 98.5°F | Ht 68.0 in | Wt 205.0 lb

## 2012-08-31 DIAGNOSIS — G4733 Obstructive sleep apnea (adult) (pediatric): Secondary | ICD-10-CM

## 2012-08-31 NOTE — Assessment & Plan Note (Signed)
Mod RDI 23/h corrected by CPAP 11 cm Did not tolerate CPAP  OK to nap in the afternoon We discussed CPAP and dental appliance We will check your oxygen level at night & see if you qualify for this -discussed that this would be suboptimal therapy for OSA but may help with daytime fatigue.Does not seem to be due to other reasons such as meds or low sugars Defer wu for hypothyroidism or PMR to primary if needed

## 2012-08-31 NOTE — Patient Instructions (Signed)
OK to nap in the afternoon We discussed CPAP and dental appliance We will check your oxygen level at night & see if you qualify for this

## 2012-08-31 NOTE — Progress Notes (Signed)
  Subjective:    Patient ID: [name], female    DOB: 10/05/1941, 71 y.o.   MRN: [id number]  HPI  71/F, german origin, diabetic, hypertensive never smoker for fu of obstructive sleep apnea.  Overnight PSG 8/11 showed moderate obstructive sleep apnea with AHi 23 /h & low desaturation of 90%, CPAP was initiated & titrated to 11 cm with full face mask with good control of events - poor tolerance.  she  turned in machine and supplies 12/02/10, because she "just didn't like it".    08/31/2012 2 y FU - EDS persists, iron infusions by dr Myna Hidalgo did not help Daughter reports loud snoring & witnessed apneas for many years.  Lately she has non refreshing sleep.  Epworth Sleepiness Score 9.  Bedtime 11 p, sleep latency minimal, 1-2 awakenings for nocturia, awakens at 0645 unrefreshed, no headaches or dryness of mouth. 2-3 cups coffee daily. Sugars 150 range, Hb a1c ok, few lows Cracked 2 ribs, not started tramadol yet Started on fosamax Nap x 30-60 mins helps Does not want to retry cpap - discussed other mask interface including nasal pillows.    Past Medical History  Diagnosis Date  . Hyperplastic colon polyp   . Hypertension   . Diabetes mellitus type II   . Osteoarthritis   . GERD (gastroesophageal reflux disease)   . Osteopenia   . Lupus     of skin see dermatologist frequently  . 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 OA (osteoarthritis of spine)     C-spine  . DVT (deep venous thrombosis)     right leg DVT 05-17-2008  . Leg swelling     left leg 2-10, u/s showed a old clot, has a hypercoag panel,  . History of cardiovascular stress test     10/09 had CP, stress test neg, likely GI (GERD)-09/2006-had CP: stress test (-)  . 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 DVT (deep vein thrombosis) in pregnancy   . DVT of leg (deep venous thrombosis) 07/28/2011  . 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 Also denies presyncope, palpitations, heartburn, abdominal pain, nausea, vomiting, diarrhea or change in bowel or urinary habits, dysuria,hematuria, rash, arthralgias, visual complaints, headache, numbness weakness or ataxia.     Objective:   Physical Exam  Gen. Pleasant, obese, in no distress ENT - no lesions, no post nasal drip Neck: No JVD, no thyromegaly, no carotid bruits Lungs: no use of accessory muscles, no dullness to percussion, decreased without rales or rhonchi  Cardiovascular: Rhythm regular, heart sounds  normal, no murmurs or gallops, no peripheral edema Musculoskeletal: No deformities, no cyanosis or clubbing , no tremors        Assessment & Plan:

## 2012-09-01 ENCOUNTER — Telehealth: Payer: Self-pay | Admitting: Hematology & Oncology

## 2012-09-01 ENCOUNTER — Encounter (HOSPITAL_COMMUNITY): Payer: Medicare Other

## 2012-09-01 ENCOUNTER — Telehealth: Payer: Self-pay | Admitting: Family

## 2012-09-01 MED ORDER — MECLIZINE HCL 12.5 MG PO TABS: 12.5000 mg | ORAL_TABLET | Freq: Three times a day (TID) | ORAL | Status: DC | PRN

## 2012-09-01 NOTE — Telephone Encounter (Signed)
Notified pt and she voices understanding. 

## 2012-09-01 NOTE — Telephone Encounter (Signed)
Spoke with pt and she states that she feels off balance when she walks, has to hold on to something when she walks. Pt denies changes in vision or speech, no extremity/muscle weakness. Denies headache or any other pain. No shortness of breath. Does not have home BP monitor. Pt reports similar episode in the past with inner ear and is requesting something to help her dizziness. Pt states she is currently unable to drive.  Please advise.

## 2012-09-01 NOTE — Telephone Encounter (Signed)
Patient called and cx 09/02/12 appt due to not feeling well.  Patient stated she will call back to resch appt

## 2012-09-01 NOTE — Telephone Encounter (Signed)
Patient states that for the past two days she has woken up dizzy. She states that she does not feel comfortable driving to our office today for an appointment. Patient would like to know if [name] would call her in something to the pharmacy.

## 2012-09-01 NOTE — Telephone Encounter (Signed)
Rx sent to pharmacy for PRN meclizine.  If she develops weakness, numbness, slurred speech, facial drooping or worsening dizziness she should be evaluated in the ED.

## 2012-09-02 ENCOUNTER — Ambulatory Visit: Payer: Medicare Other | Admitting: Hematology & Oncology

## 2012-09-02 ENCOUNTER — Other Ambulatory Visit: Payer: Medicare Other | Admitting: Lab

## 2012-09-03 ENCOUNTER — Telehealth: Payer: Self-pay | Admitting: Family

## 2012-09-03 NOTE — Telephone Encounter (Signed)
Told pt that it is ok to take tums.

## 2012-09-03 NOTE — Telephone Encounter (Signed)
SHE HAS TERRIBLE INDIGESTION.  CAN SHE TAKE TUMS WITH THE MEDICINES SHE TAKES ?

## 2012-09-06 ENCOUNTER — Encounter: Payer: Self-pay | Admitting: Family

## 2012-09-06 ENCOUNTER — Ambulatory Visit (INDEPENDENT_AMBULATORY_CARE_PROVIDER_SITE_OTHER): Payer: Medicare Other | Admitting: Family

## 2012-09-06 VITALS — BP 128/64 | HR 64 | Temp 98.4°F | Resp 18 | Ht 67.0 in | Wt 204.0 lb

## 2012-09-06 DIAGNOSIS — H9190 Unspecified hearing loss, unspecified ear: Secondary | ICD-10-CM

## 2012-09-06 NOTE — Assessment & Plan Note (Signed)
Trial of claritin,  No sign of OM on exam today. She is to meet with audiology next week for additional testing.

## 2012-09-06 NOTE — Progress Notes (Signed)
Subjective:    Patient ID: [name], female    DOB: April 24, 1941, 71 y.o.   MRN: [id number]  HPI  Pt presents today with chief complaint of decreased hearing today in the left ear.  Started this AM. Denies fever, ear pain or drainage out of the left ear.      Review of Systems See HPI  Past Medical History  Diagnosis Date  . Hyperplastic colon polyp   . Hypertension   . Diabetes mellitus type II   . Osteoarthritis   . GERD (gastroesophageal reflux disease)   . Osteopenia   . Lupus     of skin see dermatologist frequently  . 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 OA (osteoarthritis of spine)     C-spine  . DVT (deep venous thrombosis)     right leg DVT 05-17-2008  . Leg swelling     left leg 2-10, u/s showed a old clot, has a hypercoag panel,  . History of cardiovascular stress test     10/09 had CP, stress test neg, likely GI (GERD)-09/2006-had CP: stress test (-)  . 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 DVT (deep vein thrombosis) in pregnancy   . DVT of leg (deep venous thrombosis) 07/28/2011  . 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 Marital Status: Widowed    Spouse Name: N/A    Number of Children: 3  . Years of Education: N/A   Occupational History  . Retired    Social History Main Topics  . Smoking status: Never Smoker   . Smokeless tobacco: Never Used  . Alcohol Use: No  . Drug Use: No  . Sexually Active: Not Currently   Other Topics Concern  . Not on file   Social History Narrative   Single-widow 3 children , 1 in [place] (son, daughter - [place]) [place] 2002 from [place], [employer] Use - no   tobacco-- never  Illicit Drug Use - no Pt is Jehovah's Witness-No blood productsDaughter - [name]     Past Surgical History  Procedure Date  . Cholecystectomy   . Breast biopsy     left breast  . Breast lumpectomy     left  breast-ductal carcinoma in situ  . Appendectomy   . Skin cancer excision     from back     Family History  Problem Relation Age of Onset  . Diabetes Mother   . Diabetes    . Heart attack Neg Hx   . Colon cancer Neg Hx   . Breast cancer Neg Hx     Allergies  Allergen Reactions  . Fluconazole Other (See Comments)    REACTION: severe fatigue and muscle weakness  . Metformin Diarrhea  . Pioglitazone Other (See Comments)    REACTION: wt gain  . Vancomycin     Erythematous rash, hands and toes became cyanotic.      Current Outpatient Prescriptions on File Prior to Visit  Medication Sig Dispense Refill  . alendronate (FOSAMAX) 70 MG tablet Take 1 tablet (70 mg total) by mouth every 7 (seven) days. Take with a full glass of water on an empty stomach.  4 tablet  11  . B Complex-C (SUPER B COMPLEX PO) Take 1 tablet by mouth daily.      . Calcium Carbonate-Vitamin D (CALCIUM-VITAMIN  D) 600-200 MG-UNIT CAPS Take 1 tablet by mouth 2 (two) times daily.       . Cholecalciferol (VITAMIN D3) LIQD Take 35,000 Units by mouth. 2 drops at bedtime      . fluocinonide cream (LIDEX) 0.05 % Apply topically 2 (two) times daily.  30 g  0  . furosemide (LASIX) 20 MG tablet Take 20 mg by mouth daily.      Marland Kitchen glucosamine-chondroitin 500-400 MG tablet Take 1 tablet by mouth 2 (two) times daily.       Marland Kitchen glucose blood test strip Use to check blood sugar 4 times a day.      . insulin aspart (NOVOLOG) 100 UNIT/ML injection Inject 20-28 Units into the skin 3 (three) times daily.  2 vial  0  . insulin glargine (LANTUS) 100 UNIT/ML injection Inject 40 Units into the skin at bedtime.       Marland Kitchen lisinopril (PRINIVIL,ZESTRIL) 40 MG tablet Take 1 tablet (40 mg total) by mouth daily.  90 tablet  0  . meclizine (ANTIVERT) 12.5 MG tablet Take 1 tablet (12.5 mg total) by mouth 3 (three) times daily as needed.  20 tablet  0  . niacin 500 MG tablet Take 500 mg by mouth 2 (two) times daily.       Marland Kitchen nystatin (MYCOSTATIN) powder  Apply topically 4 (four) times daily.  30 g  2  . traMADol (ULTRAM) 50 MG tablet Take 1 tablet (50 mg total) by mouth every 8 (eight) hours as needed for pain.  30 tablet  0  . warfarin (COUMADIN) 5 MG tablet Take 1 1/2 tablets on Monday thru Friday, and 1 tablet on Saturday and Sunday.        BP 128/64  Pulse 64  Temp 98.4 F (36.9 C) (Oral)  Resp 18  Ht 5\' 7"  (1.702 m)  Wt 204 lb (92.534 kg)  BMI 31.95 kg/m2  SpO2 97%       Objective:   Physical Exam  Constitutional: She appears well-developed and well-nourished. No distress.  HENT:  Head: Normocephalic and atraumatic.       R TM is normal.  L TM is noted to have scarring, no erythema or bulging.   Psychiatric: She has a normal mood and affect. Her behavior is normal. Judgment and thought content normal.          Assessment & Plan:

## 2012-09-06 NOTE — Patient Instructions (Addendum)
Claritin 10mg  once daily as needed. Please call if you develop fever, ear pain, ear drainage.

## 2012-09-08 IMAGING — US US EXTREM LOW VENOUS*L*
1 series · 14 of 18 positions shown · non-contrast
Comparison: None

CLINICAL DATA: Pain and Edema;;

LEFT LOWER EXTREMITY VENOUS DUPLEX ULTRASOUND
TECHNIQUE: Gray-scale sonography with compression, as well as color
and duplex ultrasound, were performed to evaluate the deep venous
system from the level of the common femoral vein through the
popliteal and proximal calf veins.

[Series 1: us extrem low venous*left* · 14 of 18 slices shown]
[im 1/18]
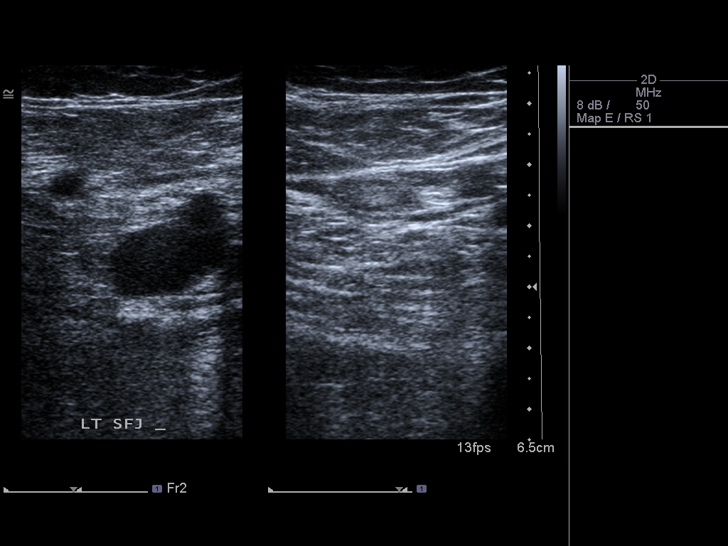
[im 2/18]
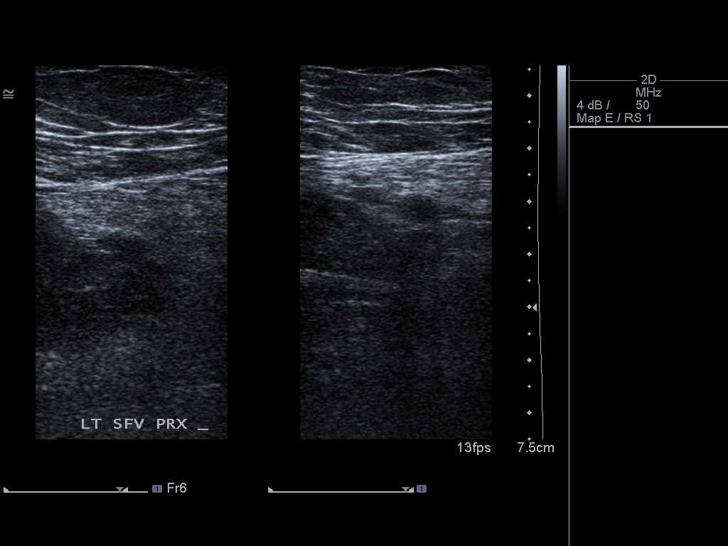
[im 4/18]
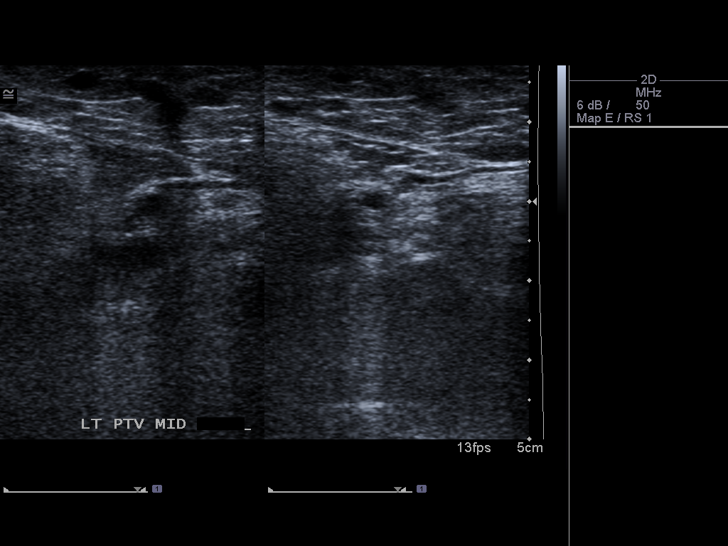
[im 5/18]
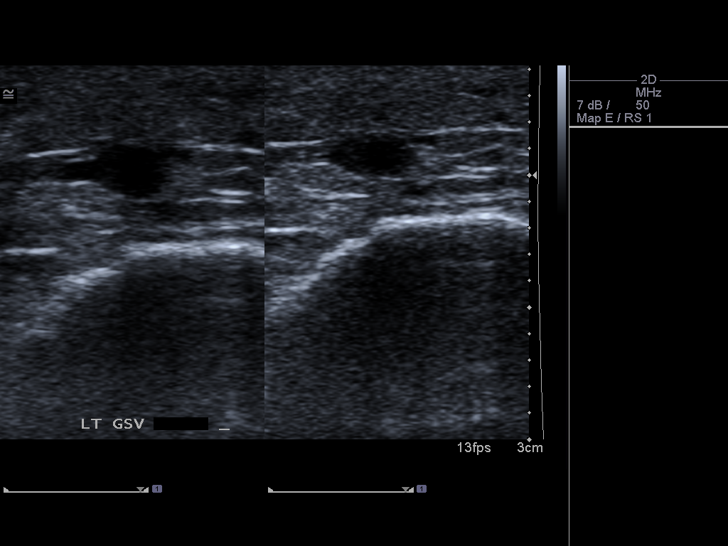
[im 6/18]
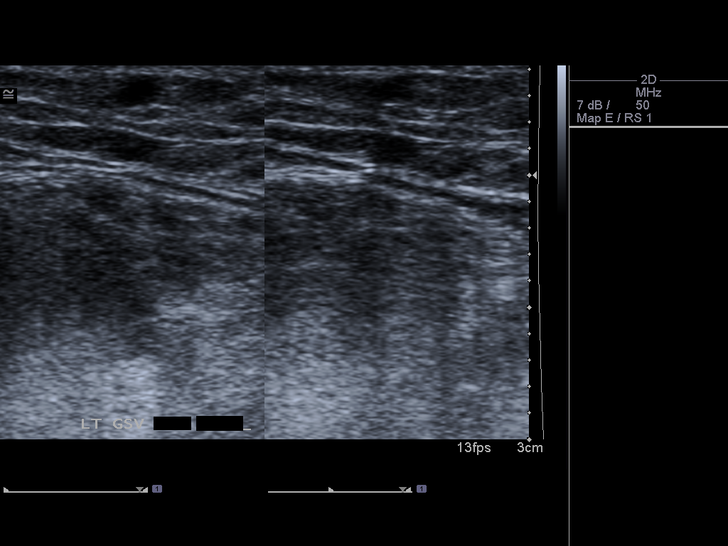
[im 8/18]
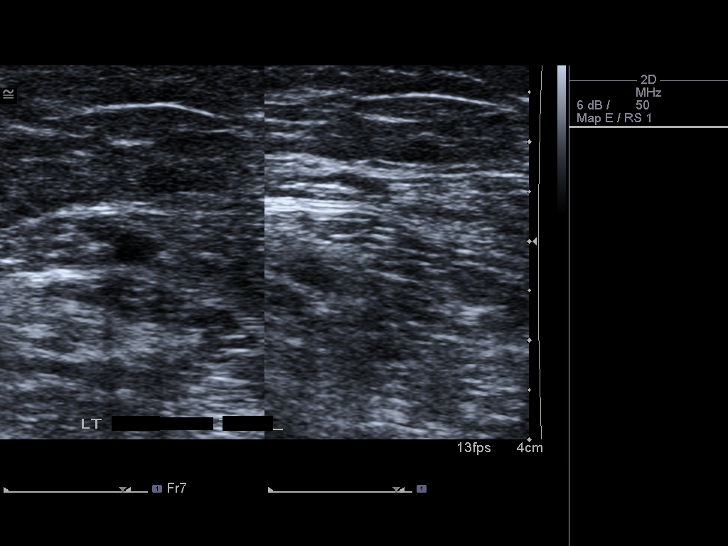
[im 9/18]
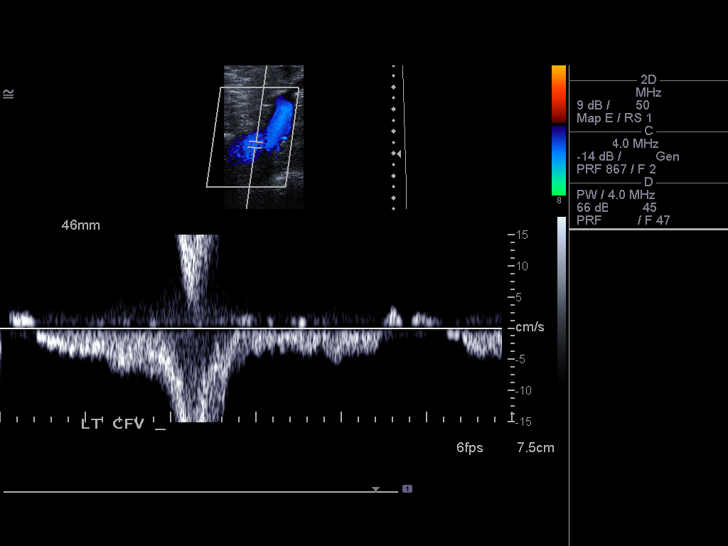
[im 10/18]
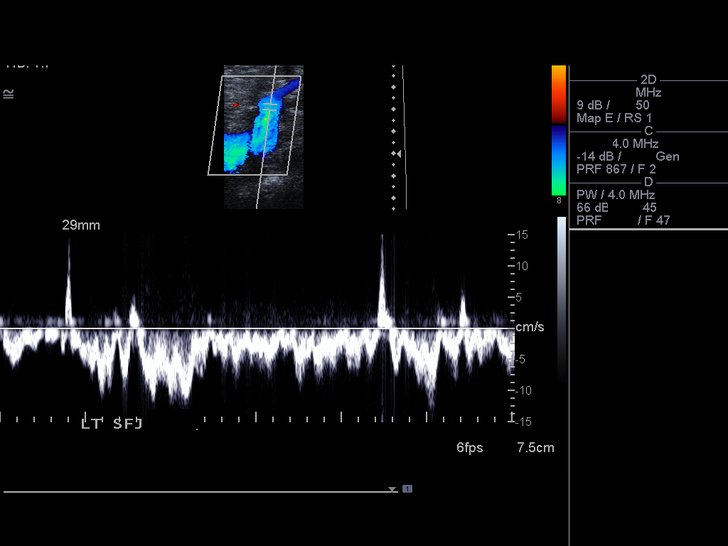
[im 11/18]
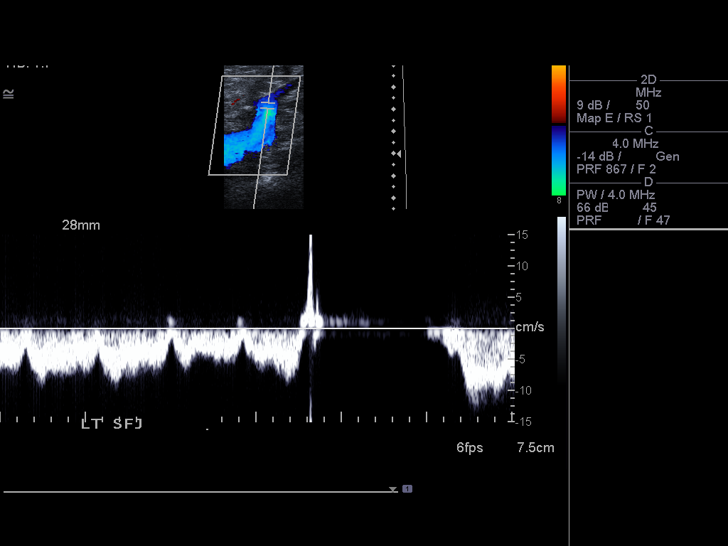
[im 13/18]
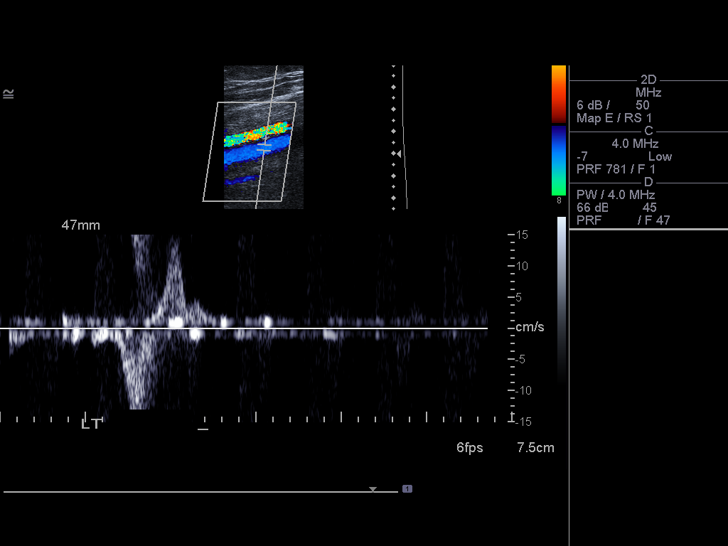
[im 14/18]
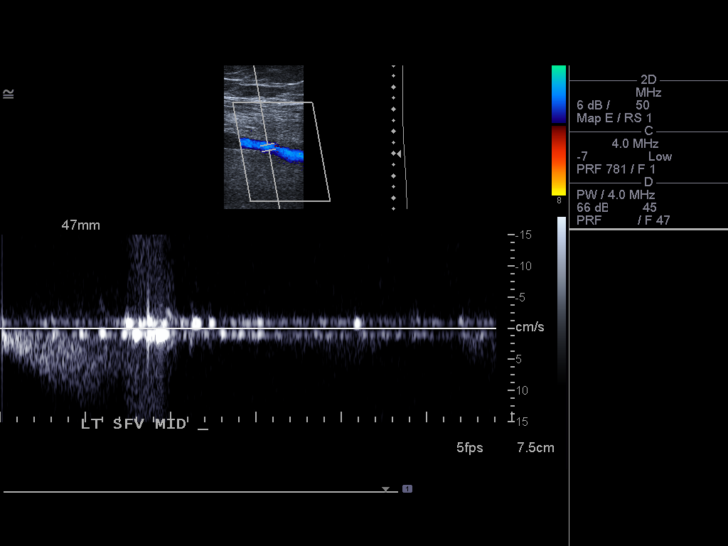
[im 15/18]
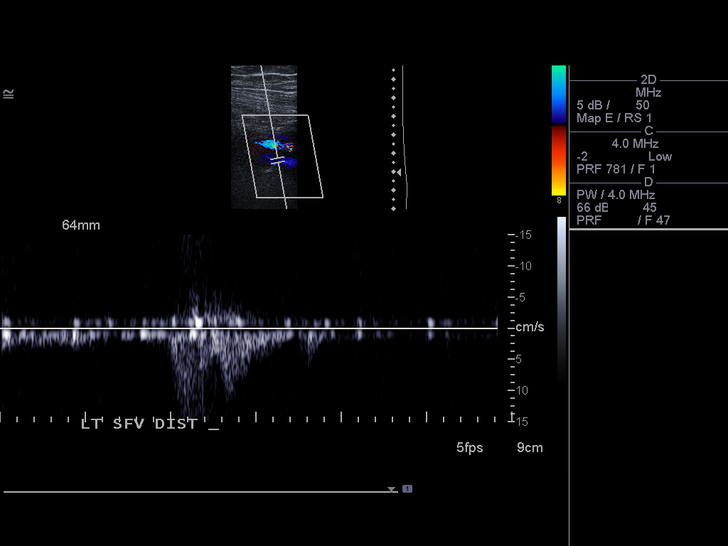
[im 17/18]
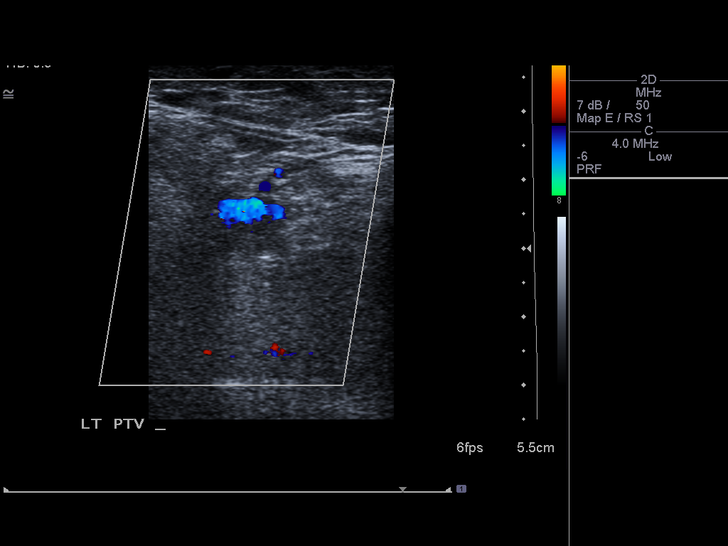
[im 18/18]
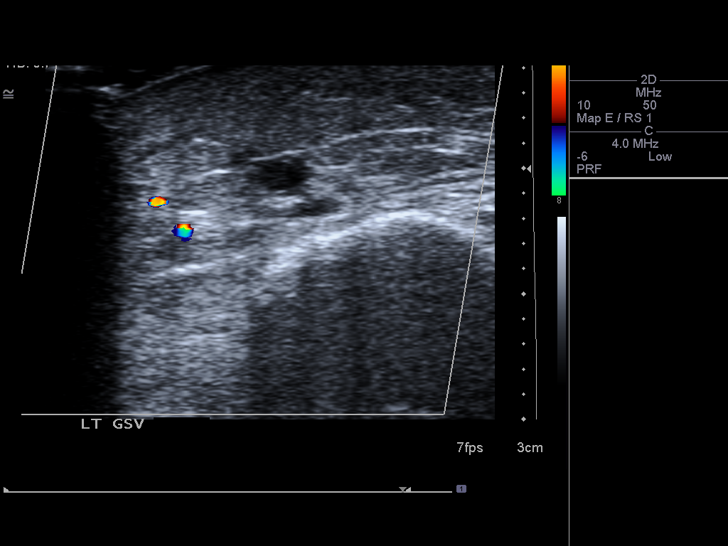

[14 of 18 positions shown; findings below may reference images not displayed]

FINDINGS: Normal compressibility and normal Doppler signal within
the common femoral, superficial femoral and popliteal veins, down
to the proximal calf veins.  No grayscale filling defects to
suggest DVT.

The greater saphenous vein is compressible to the mid calf,
noncompressible from the mid calf to the ankle.
IMPRESSION: No evidence of left lower extremity deep vein thrombosis.

Occlusion of the distal greater saphenous vein from the mid calf to
the ankle.

## 2012-09-20 ENCOUNTER — Other Ambulatory Visit: Payer: Self-pay | Admitting: Family

## 2012-09-20 ENCOUNTER — Ambulatory Visit
Admission: RE | Admit: 2012-09-20 | Discharge: 2012-09-20 | Disposition: A | Discharge status: Home / Self Care | Payer: Medicare Other | Source: Ambulatory Visit | Attending: Family | Admitting: Family

## 2012-09-20 DIAGNOSIS — R921 Mammographic calcification found on diagnostic imaging of breast: Secondary | ICD-10-CM

## 2012-09-20 DIAGNOSIS — R928 Other abnormal and inconclusive findings on diagnostic imaging of breast: Secondary | ICD-10-CM

## 2012-09-27 ENCOUNTER — Telehealth: Payer: Self-pay | Admitting: Pulmonary Disease

## 2012-09-27 NOTE — Telephone Encounter (Signed)
ONO shows low satn for 9 min only Doubt oxygen would be of benefit

## 2012-09-29 ENCOUNTER — Ambulatory Visit
Admission: RE | Admit: 2012-09-29 | Discharge: 2012-09-29 | Disposition: A | Discharge status: Home / Self Care | Payer: Medicare Other | Source: Ambulatory Visit | Attending: Family | Admitting: Family

## 2012-09-29 ENCOUNTER — Encounter: Payer: Self-pay | Admitting: Family

## 2012-09-29 ENCOUNTER — Ambulatory Visit (INDEPENDENT_AMBULATORY_CARE_PROVIDER_SITE_OTHER): Payer: Medicare Other | Admitting: Family

## 2012-09-29 VITALS — BP 140/60 | HR 70 | Temp 98.6°F | Resp 16 | Wt 206.1 lb

## 2012-09-29 DIAGNOSIS — R921 Mammographic calcification found on diagnostic imaging of breast: Secondary | ICD-10-CM

## 2012-09-29 DIAGNOSIS — L03019 Cellulitis of unspecified finger: Secondary | ICD-10-CM

## 2012-09-29 DIAGNOSIS — L03012 Cellulitis of left finger: Secondary | ICD-10-CM | POA: Insufficient documentation

## 2012-09-29 DIAGNOSIS — R928 Other abnormal and inconclusive findings on diagnostic imaging of breast: Secondary | ICD-10-CM

## 2012-09-29 MED ORDER — CEPHALEXIN 500 MG PO CAPS: 500.0000 mg | ORAL_CAPSULE | Freq: Four times a day (QID) | ORAL | Status: AC

## 2012-09-29 NOTE — Telephone Encounter (Signed)
lmomtcb x1 on home # and mobile #

## 2012-09-29 NOTE — Progress Notes (Signed)
Subjective:    Patient ID: [name], female    DOB: 1941/10/11, 71 y.o.   MRN: [id number]  HPI  [name] is a 71 yr old female who presents today with chief complaint of swelling of the tip of her left pinky finger.  Swelling started today.  She has placed neosporin on the area.   Abnormal mammogram- pt had breast biopsy today.    Left small finger- started to hurt this morning.  Now swollen. Tried neosporin.     Review of Systems See HPI  Past Medical History  Diagnosis Date  . Hyperplastic colon polyp   . Hypertension   . Diabetes mellitus type II   . Osteoarthritis   . GERD (gastroesophageal reflux disease)   . Osteopenia   . Lupus     of skin see dermatologist frequently  . 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 OA (osteoarthritis of spine)     C-spine  . DVT (deep venous thrombosis)     right leg DVT 05-17-2008  . Leg swelling     left leg 2-10, u/s showed a old clot, has a hypercoag panel,  . History of cardiovascular stress test     10/09 had CP, stress test neg, likely GI (GERD)-09/2006-had CP: stress test (-)  . 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 DVT (deep vein thrombosis) in pregnancy   . DVT of leg (deep venous thrombosis) 07/28/2011  . 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 Marital Status: Widowed    Spouse Name: N/A    Number of Children: 3  . Years of Education: N/A   Occupational History  . Retired    Social History Main Topics  . Smoking status: Never Smoker   . Smokeless tobacco: Never Used  . Alcohol Use: No  . Drug Use: No  . Sexually Active: Not Currently   Other Topics Concern  . Not on file   Social History Narrative   Single-widow 3 children , 1 in [place] (son, daughter - [place]) [place] to [place] 2002 from [place], [employer] Use - no   tobacco-- never  Illicit Drug Use - no Pt is Jehovah's Witness-No blood  productsDaughter - [name]     Past Surgical History  Procedure Date  . Cholecystectomy   . Breast biopsy     left breast  . Breast lumpectomy     left breast-ductal carcinoma in situ  . Appendectomy   . Skin cancer excision     from back     Family History  Problem Relation Age of Onset  . Diabetes Mother   . Diabetes    . Heart attack Neg Hx   . Colon cancer Neg Hx   . Breast cancer Neg Hx     Allergies  Allergen Reactions  . Fluconazole Other (See Comments)    REACTION: severe fatigue and muscle weakness  . Metformin Diarrhea  . Pioglitazone Other (See Comments)    REACTION: wt gain  . Vancomycin     Erythematous rash, hands and toes became cyanotic.      Current Outpatient Prescriptions on File Prior to Visit  Medication Sig Dispense Refill  . B Complex-C (SUPER B COMPLEX PO) Take 1 tablet by mouth daily.      . Calcium Carbonate-Vitamin D (CALCIUM-VITAMIN D) 600-200 MG-UNIT CAPS  Take 1 tablet by mouth 2 (two) times daily.       . Cholecalciferol (VITAMIN D3) LIQD Take 35,000 Units by mouth. 2 drops at bedtime      . fluocinonide cream (LIDEX) 0.05 % Apply topically 2 (two) times daily.  30 g  0  . furosemide (LASIX) 20 MG tablet Take 20 mg by mouth daily.      Marland Kitchen glucosamine-chondroitin 500-400 MG tablet Take 1 tablet by mouth 2 (two) times daily.       Marland Kitchen glucose blood test strip Use to check blood sugar 4 times a day.      . insulin glargine (LANTUS) 100 UNIT/ML injection Inject 40 Units into the skin at bedtime.       . insulin regular (NOVOLIN R,HUMULIN R) 100 units/mL injection Inject 20-28 Units into the skin 3 (three) times daily before meals.      Marland Kitchen lisinopril (PRINIVIL,ZESTRIL) 40 MG tablet Take 1 tablet (40 mg total) by mouth daily.  90 tablet  0  . meclizine (ANTIVERT) 12.5 MG tablet Take 1 tablet (12.5 mg total) by mouth 3 (three) times daily as needed.  20 tablet  0  . niacin 500 MG tablet Take 500 mg by mouth 2 (two) times daily.       Marland Kitchen nystatin  (MYCOSTATIN) powder Apply topically 4 (four) times daily.  30 g  2  . warfarin (COUMADIN) 5 MG tablet Take 1 1/2 tablets on Monday thru Friday, and 1 tablet on Saturday and Sunday.      Marland Kitchen alendronate (FOSAMAX) 70 MG tablet Take 1 tablet (70 mg total) by mouth every 7 (seven) days. Take with a full glass of water on an empty stomach.  4 tablet  11  . insulin aspart (NOVOLOG) 100 UNIT/ML injection Inject 20-28 Units into the skin 3 (three) times daily.  2 vial  0  . traMADol (ULTRAM) 50 MG tablet Take 1 tablet (50 mg total) by mouth every 8 (eight) hours as needed for pain.  30 tablet  0    BP 140/60  Pulse 70  Temp 98.6 F (37 C) (Oral)  Resp 16  Wt 206 lb 1.9 oz (93.495 kg)  SpO2 99%       Objective:   Physical Exam  Constitutional: She appears well-developed and well-nourished. No distress.  Pulmonary/Chest: Effort normal and breath sounds normal.  Skin:       Left 5th finger with swelling noted from the tip to the DIP joint.  Tender to touch.  Small scab noted on left lateral nail bed.   Psychiatric: She has a normal mood and affect. Her behavior is normal. Judgment and thought content normal.          Assessment & Plan:

## 2012-09-29 NOTE — Assessment & Plan Note (Signed)
Will rx with keflex.  Pt is instructed to soak finger bid.  Call if increased pain, swelling, or if drainage. Pt verbalizes understanding.

## 2012-09-29 NOTE — Assessment & Plan Note (Signed)
Results pending of today's mammogram.

## 2012-09-29 NOTE — Patient Instructions (Addendum)
Soak your finger in warm water twice a day. Call if redness/swelling worsens or if no improvement in 2-3 days. Follow up in 1 week.

## 2012-09-30 NOTE — Telephone Encounter (Signed)
I spoke with patient about results and she verbalized understanding and had no questions 

## 2012-10-04 ENCOUNTER — Ambulatory Visit: Payer: Medicare Other | Admitting: Family

## 2012-10-08 ENCOUNTER — Encounter: Payer: Self-pay | Admitting: Physician Assistant

## 2012-10-08 ENCOUNTER — Ambulatory Visit (INDEPENDENT_AMBULATORY_CARE_PROVIDER_SITE_OTHER): Payer: Medicare Other | Admitting: Physician Assistant

## 2012-10-08 VITALS — BP 160/70 | HR 80 | Ht 66.0 in | Wt 208.0 lb

## 2012-10-08 DIAGNOSIS — Z86010 Personal history of colonic polyps: Secondary | ICD-10-CM

## 2012-10-08 DIAGNOSIS — Z7901 Long term (current) use of anticoagulants: Secondary | ICD-10-CM

## 2012-10-08 MED ORDER — MOVIPREP 100 G PO SOLR: 1.0000 | Freq: Once | ORAL | Status: AC

## 2012-10-08 NOTE — Progress Notes (Signed)
Reviewed. She had bilateral PE's just 1 year ago, should we wait with the colonoscopy till she gets off her Coumadin, if planned within one year. Otherwise I would feel safer with a Lovenox  bridge if she can afford it. I will ask Dr Myna Hidalgo.

## 2012-10-08 NOTE — Progress Notes (Signed)
Subjective:    Patient ID: [name], female    DOB: [birth year], 71 y.o.   MRN: [id number]  HPI [name] is a 71 year old female known to Dr. [name] who comes in today to discuss followup colonoscopy. She is now on Coumadin over the past year with history of DVT and PE in 2012. Her history is also pertinent for hypertension, insulin-dependent diabetes mellitus, GERD, history of breast cancer in 2003 area and Her last colonoscopy was done in March of 2007 she had 2 small polyps removed both of which were hyperplastic. Prior to that she had colonoscopy in 2006 with removal of a 19 mm tubular adenoma. She currently has no GI complaints other than sporadic problems with urgency and fecal incontinence. She has no complaints of abdominal pain, change in bowel habits, melena or hematochezia.. She is followed by Dr. Myna Hidalgo, and her Coumadin is managed by the Coumadin clinic on  [address].    Review of Systems  Constitutional: Negative.   HENT: Negative.   Eyes: Negative.   Respiratory: Negative.   Cardiovascular: Negative.   Genitourinary: Negative.   Musculoskeletal: Negative.   Neurological: Negative.   Hematological: Negative.   Psychiatric/Behavioral: Negative.    Outpatient Prescriptions Prior to Visit  Medication Sig Dispense Refill  . B Complex-C (SUPER B COMPLEX PO) Take 1 tablet by mouth daily.      . Calcium Carbonate-Vitamin D (CALCIUM-VITAMIN D) 600-200 MG-UNIT CAPS Take 1 tablet by mouth 2 (two) times daily.       . cephALEXin (KEFLEX) 500 MG capsule Take 1 capsule (500 mg total) by mouth 4 (four) times daily.  40 capsule  0  . Cholecalciferol (VITAMIN D3) LIQD Take 35,000 Units by mouth. 2 drops at bedtime      . fluocinonide cream (LIDEX) 0.05 % Apply topically 2 (two) times daily.  30 g  0  . furosemide (LASIX) 20 MG tablet Take 20 mg by mouth daily.      Marland Kitchen glucosamine-chondroitin 500-400 MG tablet Take 1 tablet by mouth 2 (two) times daily.       Marland Kitchen glucose blood  test strip Use to check blood sugar 4 times a day.      Marland Kitchen glucose blood test strip FREESTYLE TEST STRIP  Use to test blood sugar 3-4 times daily.      . insulin glargine (LANTUS) 100 UNIT/ML injection Inject 40 Units into the skin at bedtime.       . insulin regular (NOVOLIN R,HUMULIN R) 100 units/mL injection Inject 20-28 Units into the skin 3 (three) times daily before meals.      Marland Kitchen lisinopril (PRINIVIL,ZESTRIL) 40 MG tablet Take 1 tablet (40 mg total) by mouth daily.  90 tablet  0  . niacin 500 MG tablet Take 500 mg by mouth 2 (two) times daily.       Marland Kitchen nystatin (MYCOSTATIN) powder Apply topically 4 (four) times daily.  30 g  2  . warfarin (COUMADIN) 5 MG tablet Take 1 1/2 tablets on Monday thru Friday, and 1 tablet on Saturday and Sunday.      Marland Kitchen alendronate (FOSAMAX) 70 MG tablet Take 1 tablet (70 mg total) by mouth every 7 (seven) days. Take with a full glass of water on an empty stomach.  4 tablet  11  . meclizine (ANTIVERT) 12.5 MG tablet Take 1 tablet (12.5 mg total) by mouth 3 (three) times daily as needed.  20 tablet  0      Allergies  Allergen Reactions  .  Fluconazole Other (See Comments)    REACTION: severe fatigue and muscle weakness  . Metformin Diarrhea  . Pioglitazone Other (See Comments)    REACTION: wt gain  . Vancomycin     Erythematous rash, hands and toes became cyanotic.     Patient Active Problem List  Diagnosis  . THYROID NODULE  . DIABETES MELLITUS, TYPE II, UNCONTROLLED  . HYPERLIPIDEMIA  . SLEEP APNEA, OBSTRUCTIVE  . NEUROPATHY  . HYPERTENSION  . GERD  . OSTEOARTHRITIS  . OSTEOPENIA  . Edema  . ADENOCARCINOMA, BREAST, HX OF  . COLONIC POLYPS, HYPERPLASTIC, HX OF  . Anemia  . Melanoma in situ of back  . Overactive bladder  . Cutaneous lupus erythematosus  . Plantar fasciitis  . Atypical chest pain  . Back pain  . Refusal of blood transfusions as patient is Jehovah's Witness  . Eczema  . Chronic anticoagulation  . Anemia  . H/O exertional chest  pain  . Hearing problem  . Cellulitis of fifth finger of left hand  . Abnormal mammogram   History  Substance Use Topics  . Smoking status: Never Smoker   . Smokeless tobacco: Never Used  . Alcohol Use: No    Objective:   Physical Exam  well-developed older white female in no acute distress, blood pressure 160/70 pulse 80 height 5 foot 6 weight 208. HEENT; nontraumatic normocephalic EOMI PERRLA sclera anicteric,Neck; Supple no JVD, Cardiovascular; regular rate and rhythm with S1-S2 soft systolic murmur, Pulmonary; clear bilaterally, Abdomen; large, soft, nontender, nondistended no palpable mass or hepatosplenomegaly, Rectal; exam not done, Extremities; no clubbing cyanosis or edema skin warm and dry, Psych;mood and affect appropriate.        Assessment & Plan:  #57 71 year old female with history of adenomatous and hyperplastic colon polyps; last colonoscopy March 2007-due for followup. #2 now chronically anticoagulated with Coumadin with history of DVT and bilateral pulmonary emboli 2012. 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 [name], procedure discussed in detail with patient including need to hold her anticoagulation if at all possible prior to the procedure. She is agreeable to proceed We will obtain consent from Dr. Myna Hidalgo to hold Coumadin for 5 days prior to her procedure, and if bridge with Lovenox is advised we'll arrange through the Coumadin clinic on [address]

## 2012-10-08 NOTE — Patient Instructions (Addendum)
You have been scheduled for a colonoscopy with propofol. Please follow written instructions given to you at your visit today.  Please pick up your prep kit at the pharmacy within the next 1-3 days. If you use inhalers (even only as needed) or a CPAP machine, please bring them with you on the day of your procedure.  

## 2012-10-11 ENCOUNTER — Encounter: Payer: Self-pay | Admitting: Pulmonary Disease

## 2012-10-12 ENCOUNTER — Ambulatory Visit: Payer: Medicare Other | Admitting: Family

## 2012-10-18 ENCOUNTER — Encounter: Payer: Self-pay | Admitting: Family

## 2012-10-18 ENCOUNTER — Ambulatory Visit (INDEPENDENT_AMBULATORY_CARE_PROVIDER_SITE_OTHER): Payer: Medicare Other | Admitting: Family

## 2012-10-18 VITALS — BP 136/64 | HR 66 | Temp 98.8°F | Resp 16 | Wt 206.0 lb

## 2012-10-18 DIAGNOSIS — L03019 Cellulitis of unspecified finger: Secondary | ICD-10-CM

## 2012-10-18 DIAGNOSIS — R928 Other abnormal and inconclusive findings on diagnostic imaging of breast: Secondary | ICD-10-CM

## 2012-10-18 DIAGNOSIS — L02519 Cutaneous abscess of unspecified hand: Secondary | ICD-10-CM

## 2012-10-18 DIAGNOSIS — L03012 Cellulitis of left finger: Secondary | ICD-10-CM

## 2012-10-18 DIAGNOSIS — Z5181 Encounter for therapeutic drug level monitoring: Secondary | ICD-10-CM

## 2012-10-18 DIAGNOSIS — R350 Frequency of micturition: Secondary | ICD-10-CM | POA: Insufficient documentation

## 2012-10-18 LAB — POCT URINALYSIS DIPSTICK
Ketones, UA: NEGATIVE
Nitrite, UA: NEGATIVE
Urobilinogen, UA: 0.2
pH, UA: 6

## 2012-10-18 LAB — PROTIME-INR: Prothrombin Time: 25.3 seconds — ABNORMAL HIGH (ref 11.6–15.2)

## 2012-10-18 NOTE — Patient Instructions (Addendum)
We will contact you with the results of your urine culture.   Please follow up in 2 months.

## 2012-10-18 NOTE — Assessment & Plan Note (Signed)
S/p biopsy- path as below.  Diagnosis Breast, right, needle core biopsy, medial - BENIGN BREAST PARENCHYMA. - VASCULAR CALCIFICATIONS PRESENT. - NO ATYPIA, HYPERPLASIA OR MALIGNANCY IDENTIFIED.

## 2012-10-18 NOTE — Progress Notes (Signed)
Subjective:    Patient ID: [name], female    DOB: 1941/04/30, 71 y.o.   MRN: [id number]  HPI  [name] is a 71 yr old female who presents today for follow up of her cellulitis of her left pinky finger.  She reports improvement in pain/swelling.  Some peeling of the skin is noted.    + urinary frequency- occasional urinary incontinence.  Denies dysuria.  Nocturia- x 3.  Denies fever.   Abnormal mammogram- she recently underwent a biopsy which was benign.     Review of Systems    see HPI  Past Medical History  Diagnosis Date  . Hyperplastic colon polyp   . Hypertension   . Diabetes mellitus type II   . Osteoarthritis   . GERD (gastroesophageal reflux disease)   . Osteopenia   . Lupus     of skin see dermatologist frequently  . 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 OA (osteoarthritis of spine)     C-spine  . DVT (deep venous thrombosis)     right leg DVT 05-17-2008  . Leg swelling     left leg 2-10, u/s showed a old clot, has a hypercoag panel,  . History of cardiovascular stress test     10/09 had CP, stress test neg, likely GI (GERD)-09/2006-had CP: stress test (-)  . 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 DVT (deep vein thrombosis) in pregnancy   . DVT of leg (deep venous thrombosis) 07/28/2011  . 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 Marital Status: Widowed    Spouse Name: N/A    Number of Children: 3  . Years of Education: N/A   Occupational History  . Retired    Social History Main Topics  . Smoking status: Never Smoker   . Smokeless tobacco: Never Used  . Alcohol Use: No  . Drug Use: No  . Sexually Active: Not Currently   Other Topics Concern  . Not on file   Social History Narrative   Single-widow 3 children , 1 in [place] (son, daughter - [place]) [place] to [place] 2002 from [place], [employer] Use - no   tobacco-- never   Illicit Drug Use - no Pt is Jehovah's Witness-No blood productsDaughter - [name]     Past Surgical History  Procedure Date  . Cholecystectomy   . Breast biopsy     left breast  . Breast lumpectomy     left breast-ductal carcinoma in situ  . Appendectomy   . Skin cancer excision     from back     Family History  Problem Relation Age of Onset  . Diabetes Mother   . Diabetes    . Heart attack Neg Hx   . Colon cancer Neg Hx   . Breast cancer Neg Hx     Allergies  Allergen Reactions  . Fluconazole Other (See Comments)    REACTION: severe fatigue and muscle weakness  . Metformin Diarrhea  . Pioglitazone Other (See Comments)    REACTION: wt gain  . Vancomycin     Erythematous rash, hands and toes became cyanotic.      Current Outpatient Prescriptions on File Prior to Visit  Medication Sig Dispense Refill  . B Complex-C (SUPER B COMPLEX PO) Take 1 tablet by mouth daily.      Marland Kitchen  Calcium Carbonate-Vitamin D (CALCIUM-VITAMIN D) 600-200 MG-UNIT CAPS Take 1 tablet by mouth 2 (two) times daily.       . Cholecalciferol (VITAMIN D3) LIQD Take 35,000 Units by mouth. 2 drops at bedtime      . fluocinonide cream (LIDEX) 0.05 % Apply topically 2 (two) times daily.  30 g  0  . furosemide (LASIX) 20 MG tablet Take 20 mg by mouth daily.      Marland Kitchen glucosamine-chondroitin 500-400 MG tablet Take 1 tablet by mouth 2 (two) times daily.       Marland Kitchen glucose blood test strip Use to check blood sugar 4 times a day.      Marland Kitchen glucose blood test strip FREESTYLE TEST STRIP  Use to test blood sugar 3-4 times daily.      . insulin glargine (LANTUS) 100 UNIT/ML injection Inject 40 Units into the skin at bedtime.       . insulin regular (NOVOLIN R,HUMULIN R) 100 units/mL injection Inject 22-32 Units into the skin 3 (three) times daily before meals.       Marland Kitchen lisinopril (PRINIVIL,ZESTRIL) 40 MG tablet Take 1 tablet (40 mg total) by mouth daily.  90 tablet  0  . niacin 500 MG tablet Take 500 mg by mouth 2 (two) times  daily.       Marland Kitchen nystatin (MYCOSTATIN) powder Apply topically 4 (four) times daily.  30 g  2  . warfarin (COUMADIN) 5 MG tablet Take 1 1/2 tablets on Monday thru Friday, and 1 tablet on Saturday and Sunday.        BP 136/64  Pulse 66  Temp 98.8 F (37.1 C) (Oral)  Resp 16  Wt 206 lb (93.441 kg)  SpO2 97%    Objective:   Physical Exam  Constitutional: She is oriented to person, place, and time. She appears well-developed and well-nourished. No distress.  Cardiovascular: Normal rate and regular rhythm.   No murmur heard. Pulmonary/Chest: Effort normal and breath sounds normal. No respiratory distress. She has no wheezes. She has no rales. She exhibits no tenderness.  Neurological: She is alert and oriented to person, place, and time.  Skin: Skin is warm and dry.       Left 5th finger is without swelling.  Mild peeling of skin noted on palmar surface of distal finger.            Assessment & Plan:

## 2012-10-18 NOTE — Assessment & Plan Note (Signed)
Reviewed UA- notes trace leukocytes only.  Will send for culture. Plan to treat if positive. DM2 and overactive bladder are also potential contributing factors. If culture negative, consider med for overactive bladder.

## 2012-10-18 NOTE — Assessment & Plan Note (Signed)
Resolved, monitor

## 2012-10-19 ENCOUNTER — Other Ambulatory Visit: Payer: Self-pay | Admitting: *Deleted

## 2012-10-19 MED ORDER — NYSTATIN 100000 UNIT/GM EX CREA: TOPICAL_CREAM | Freq: Two times a day (BID) | CUTANEOUS | Status: DC

## 2012-10-19 NOTE — Telephone Encounter (Signed)
Spoke with pt and she reports that she has itching in her groin area and under her breasts. No visible rash but areas do get red in color. Pt states she used Nystatin cream in the past and it seemed to work better than the powder. Pt requests refill of Nystatin Cream.  Please advise.

## 2012-10-19 NOTE — Telephone Encounter (Signed)
rx sent to [facility] 

## 2012-10-19 NOTE — Telephone Encounter (Signed)
Notified pt. She also states she forgot to mention that she continues to have swelling of her right foot / ankle. She is unable to get her boot on her foot. Reports that skin is dark in color. Wants to know if she should increase her fluid medication?  Please advise.

## 2012-10-20 NOTE — Telephone Encounter (Signed)
Attempted to reach pt and left detailed message on home # and to call if any questions. 

## 2012-10-20 NOTE — Telephone Encounter (Signed)
She can increase furosemide from 20mg  to 40mg  for 3 days. Then she should return to 20 mg please.

## 2012-10-22 ENCOUNTER — Telehealth: Payer: Self-pay | Admitting: Family

## 2012-10-22 MED ORDER — CEFUROXIME AXETIL 500 MG PO TABS: ORAL_TABLET | ORAL | Status: DC

## 2012-10-22 NOTE — Telephone Encounter (Signed)
Spoke to pt re: + urine culture. She tells me she has a full bottle of keflex and will take QID.  Cancelled rx for ceftin. She will call if symptoms worsen or do not improve.

## 2012-10-23 ENCOUNTER — Other Ambulatory Visit: Payer: Self-pay | Admitting: Family

## 2012-10-25 ENCOUNTER — Telehealth: Payer: Self-pay | Admitting: Internal Medicine

## 2012-10-25 ENCOUNTER — Telehealth: Payer: Self-pay | Admitting: *Deleted

## 2012-10-25 NOTE — Telephone Encounter (Signed)
Left A message for Caryn Bee to call me.

## 2012-10-25 NOTE — Telephone Encounter (Signed)
Received call from [place] at the [facility] with [facility]. He reports that pt told him she will be having a colonoscopy around the first of the year. GI wants pt to be off of Coumadin x 5 days. Caryn Bee wants to know if you want the pt bridged?  Please advise.

## 2012-10-25 NOTE — Telephone Encounter (Signed)
Do I need to give them directions re: lovenox or will they know?

## 2012-10-25 NOTE — Telephone Encounter (Signed)
Spoke with Caryn Bee at [facility] and he states the patient is followed by Alma Downs.Lendell Caprice for her Coumadin. He will check with her about Lovenox bridge for procedure.

## 2012-10-25 NOTE — Telephone Encounter (Signed)
Yes pt will need to be bridged please with lovenox.

## 2012-10-26 NOTE — Telephone Encounter (Signed)
They should know.

## 2012-10-27 NOTE — Telephone Encounter (Signed)
Attempted to reach [name] and left message re: below instructions.

## 2012-11-03 ENCOUNTER — Encounter: Payer: Self-pay | Admitting: Internal Medicine

## 2012-11-12 ENCOUNTER — Telehealth: Payer: Self-pay | Admitting: *Deleted

## 2012-11-12 ENCOUNTER — Other Ambulatory Visit: Payer: Self-pay | Admitting: *Deleted

## 2012-11-12 NOTE — Telephone Encounter (Signed)
I called Caryn Bee from [facility] at [phone]. I asked him about the coumadin instructions regarding the colonoscopy the patient rescheduled from 11-26-2012 to 01-19-2013.  He said they dont manage the coumadin for the patient but will follow the instructions from Daryel Gerald NP who does manage it. The patient has an appointment with Caryn Bee on 01-13-2013 for her instructions. He said the patient will hold the coumadin for 5 days and will be bridged with Lovenox for 4 days prior to the colonoscopy.

## 2012-11-16 ENCOUNTER — Telehealth: Payer: Self-pay | Admitting: *Deleted

## 2012-11-16 ENCOUNTER — Ambulatory Visit: Payer: Medicare Other | Admitting: Family Medicine

## 2012-11-16 ENCOUNTER — Encounter: Payer: Self-pay | Admitting: Internal Medicine

## 2012-11-16 ENCOUNTER — Ambulatory Visit (INDEPENDENT_AMBULATORY_CARE_PROVIDER_SITE_OTHER): Payer: Medicare Other | Admitting: Internal Medicine

## 2012-11-16 VITALS — BP 142/78 | HR 66 | Temp 98.8°F | Resp 16 | Wt 205.0 lb

## 2012-11-16 DIAGNOSIS — J069 Acute upper respiratory infection, unspecified: Secondary | ICD-10-CM

## 2012-11-16 MED ORDER — AMOXICILLIN 875 MG PO TABS: 875.0000 mg | ORAL_TABLET | Freq: Two times a day (BID) | ORAL | Status: DC

## 2012-11-16 NOTE — Telephone Encounter (Signed)
Message copied by Derry Skill on Tue Nov 16, 2012 12:25 PM ------      Message from: [name], MELISSA      Created: Tue Nov 16, 2012 11:15 AM       Sherran Margolis,            I am on vacation, sorry about not getting back to you sooner. Yes the below instructions are correct.             Thanks,            Melissa      ----- Message -----         From: Derry Skill, CMA         Sent: 11/15/2012   4:50 PM           To: Sandford Craze, NP            Our patient is having a colonoscopy on 01-19-2013.  I faxed a letter on 11-12-2012 to advise you of this. The patient has an appointment with Caryn Bee at the [facility] on 01-13-2013.  I see in Epic that you noted the patient will have to be bridged with Lovenox prior to the procedure. Caryn Bee said his instructions from you were the patient will hold the coumadin for 5 days and be bridged with Lovenox for 4 days.      I just wanted to be sure this was correct.            Please advise you agree with these instructions.                  Lowry Ram CMA

## 2012-11-16 NOTE — Telephone Encounter (Signed)
Spoke w/Molly to inform that pt was seen in our office today and placed on Amoxicillin 875 mg BID x7 Days per [facility], recipient understood & agreed to forward information to provider at facility/SLS

## 2012-11-16 NOTE — Telephone Encounter (Signed)
Sandford Craze NP did get back to me stating the instructions are correct.  The patient is to hold Coumadin for 5 days and be bridged with Lovenox for 4 days.  The patient does have an appointment at [facility] with Caryn Bee on 01-13-2013.  The colonoscopy is scheduled on 01-19-2013.

## 2012-11-18 ENCOUNTER — Ambulatory Visit: Payer: Medicare Other | Admitting: Internal Medicine

## 2012-11-20 DIAGNOSIS — J069 Acute upper respiratory infection, unspecified: Secondary | ICD-10-CM | POA: Insufficient documentation

## 2012-11-20 NOTE — Progress Notes (Signed)
  Subjective:    Patient ID: [name], female    DOB: May 17, 1941, 72 y.o.   MRN: [id number]  HPI Pt presents to clinic for evaluation of cough. Notes 5d h/o ST, nasal congestion and cough productive for white sputum. Denies f/c, hemoptysis or wheezing. Using cough drops without improvement. No alleviating or exacerbating factors.   Past Medical History  Diagnosis Date  . Hyperplastic colon polyp   . Hypertension   . Diabetes mellitus type II   . Osteoarthritis   . GERD (gastroesophageal reflux disease)   . Osteopenia   . Lupus     of skin see dermatologist frequently  . 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 OA (osteoarthritis of spine)     C-spine  . DVT (deep venous thrombosis)     right leg DVT 05-17-2008  . Leg swelling     left leg 2-10, u/s showed a old clot, has a hypercoag panel,  . History of cardiovascular stress test     10/09 had CP, stress test neg, likely GI (GERD)-09/2006-had CP: stress test (-)  . 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 DVT (deep vein thrombosis) in pregnancy   . DVT of leg (deep venous thrombosis) 07/28/2011  . 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 Cholecystectomy   . Breast biopsy     left breast  . Breast lumpectomy     left breast-ductal carcinoma in situ  . Appendectomy   . Skin cancer excision     from back     reports that she has never smoked. She has never used smokeless tobacco. She reports that she does not drink alcohol or use illicit drugs. family history includes Diabetes in her mother and unspecified family member.  There is no history of Heart attack, and Colon cancer, and Breast cancer, . Allergies  Allergen Reactions  . Fluconazole Other (See Comments)    REACTION: severe fatigue and muscle weakness  . Metformin Diarrhea  . Pioglitazone Other (See Comments)    REACTION: wt gain  . Vancomycin      Erythematous rash, hands and toes became cyanotic.       Review of Systems see hpi     Objective:   Physical Exam  Nursing note and vitals reviewed. Constitutional: She appears well-developed and well-nourished. No distress.  HENT:  Head: Normocephalic and atraumatic.  Right Ear: Tympanic membrane, external ear and ear canal normal.  Left Ear: External ear and ear canal normal. Tympanic membrane is erythematous and bulging.  Nose: Nose normal.  Mouth/Throat: Oropharynx is clear and moist. No oropharyngeal exudate.  Eyes: Conjunctivae normal are normal. No scleral icterus.  Neck: Neck supple.  Pulmonary/Chest: Effort normal and breath sounds normal. No respiratory distress. She has no wheezes. She has no rales.  Neurological: She is alert.  Skin: She is not diaphoretic.  Psychiatric: She has a normal mood and affect.          Assessment & Plan:

## 2012-11-20 NOTE — Assessment & Plan Note (Signed)
Begin abx course. Notify coumadin clinic of abx use. Followup if no improvement or worsening.

## 2012-11-22 ENCOUNTER — Ambulatory Visit: Payer: Medicare Other | Admitting: Family

## 2012-11-26 ENCOUNTER — Encounter: Payer: Medicare Other | Admitting: Internal Medicine

## 2012-11-27 ENCOUNTER — Other Ambulatory Visit: Payer: Self-pay | Admitting: Family

## 2012-11-29 NOTE — Telephone Encounter (Signed)
Furosemide request denied as refill was just sent on 10/23/12; #30 x 2 refills. Note sent to pharmacy.

## 2012-12-20 ENCOUNTER — Ambulatory Visit: Payer: Medicare Other | Admitting: Family

## 2012-12-20 ENCOUNTER — Ambulatory Visit (INDEPENDENT_AMBULATORY_CARE_PROVIDER_SITE_OTHER): Payer: Medicare Other | Admitting: Family

## 2012-12-20 ENCOUNTER — Ambulatory Visit (HOSPITAL_BASED_OUTPATIENT_CLINIC_OR_DEPARTMENT_OTHER)
Admission: RE | Admit: 2012-12-20 | Discharge: 2012-12-20 | Disposition: A | Discharge status: Home / Self Care | Payer: Medicare Other | Source: Ambulatory Visit | Attending: Family | Admitting: Family

## 2012-12-20 ENCOUNTER — Telehealth: Payer: Self-pay | Admitting: Family

## 2012-12-20 ENCOUNTER — Encounter: Payer: Self-pay | Admitting: Family

## 2012-12-20 ENCOUNTER — Ambulatory Visit (HOSPITAL_COMMUNITY): Payer: Medicare Other

## 2012-12-20 VITALS — BP 146/66 | HR 76 | Temp 98.3°F | Resp 16 | Ht 67.0 in | Wt 203.0 lb

## 2012-12-20 DIAGNOSIS — M51379 Other intervertebral disc degeneration, lumbosacral region without mention of lumbar back pain or lower extremity pain: Secondary | ICD-10-CM | POA: Insufficient documentation

## 2012-12-20 DIAGNOSIS — L304 Erythema intertrigo: Secondary | ICD-10-CM

## 2012-12-20 DIAGNOSIS — M542 Cervicalgia: Secondary | ICD-10-CM

## 2012-12-20 DIAGNOSIS — M545 Low back pain, unspecified: Secondary | ICD-10-CM | POA: Insufficient documentation

## 2012-12-20 DIAGNOSIS — H65499 Other chronic nonsuppurative otitis media, unspecified ear: Secondary | ICD-10-CM | POA: Insufficient documentation

## 2012-12-20 DIAGNOSIS — M549 Dorsalgia, unspecified: Secondary | ICD-10-CM

## 2012-12-20 DIAGNOSIS — M5137 Other intervertebral disc degeneration, lumbosacral region: Secondary | ICD-10-CM | POA: Insufficient documentation

## 2012-12-20 DIAGNOSIS — L538 Other specified erythematous conditions: Secondary | ICD-10-CM

## 2012-12-20 DIAGNOSIS — W19XXXA Unspecified fall, initial encounter: Secondary | ICD-10-CM | POA: Insufficient documentation

## 2012-12-20 DIAGNOSIS — T07XXXA Unspecified multiple injuries, initial encounter: Secondary | ICD-10-CM | POA: Insufficient documentation

## 2012-12-20 DIAGNOSIS — R51 Headache: Secondary | ICD-10-CM | POA: Insufficient documentation

## 2012-12-20 DIAGNOSIS — R296 Repeated falls: Secondary | ICD-10-CM | POA: Insufficient documentation

## 2012-12-20 DIAGNOSIS — Z9181 History of falling: Secondary | ICD-10-CM

## 2012-12-20 DIAGNOSIS — S0990XA Unspecified injury of head, initial encounter: Secondary | ICD-10-CM

## 2012-12-20 DIAGNOSIS — Z5181 Encounter for therapeutic drug level monitoring: Secondary | ICD-10-CM

## 2012-12-20 DIAGNOSIS — Z7901 Long term (current) use of anticoagulants: Secondary | ICD-10-CM | POA: Insufficient documentation

## 2012-12-20 LAB — BASIC METABOLIC PANEL WITH GFR
BUN: 12 mg/dL (ref 6–23)
CO2: 29 mEq/L (ref 19–32)
Chloride: 106 mEq/L (ref 96–112)
GFR, Est Non African American: 71 mL/min
Potassium: 4.1 mEq/L (ref 3.5–5.3)

## 2012-12-20 LAB — CBC WITH DIFFERENTIAL/PLATELET
HCT: 36.6 % (ref 36.0–46.0)
Hemoglobin: 12.2 g/dL (ref 12.0–15.0)
Lymphocytes Relative: 29 % (ref 12–46)
MCHC: 33.3 g/dL (ref 30.0–36.0)
MCV: 81.2 fL (ref 78.0–100.0)
Monocytes Absolute: 0.7 10*3/uL (ref 0.1–1.0)
Monocytes Relative: 9 % (ref 3–12)
Neutro Abs: 4.9 10*3/uL (ref 1.7–7.7)

## 2012-12-20 MED ORDER — LORATADINE 10 MG PO TABS: 10.0000 mg | ORAL_TABLET | Freq: Every day | ORAL | Status: DC

## 2012-12-20 NOTE — Patient Instructions (Addendum)
Please complete imaging on the first floor.   Call if you have additional falls. You will be contated about your referral to physical therapy, Please let us know if you have not heard back within 1 week about your referral. Follow up in 6 weeks.

## 2012-12-20 NOTE — Assessment & Plan Note (Signed)
She denies LOC.  Due to complaint of neck/back pain- will obtain plain films.  Obtain CT head to rule out bleed, though clinical suspicion is low.  We discussed referral to PT but she declines due to cost.  I actually think that she has a left ear effusion which may be contributing to her dizziness.

## 2012-12-20 NOTE — Progress Notes (Signed)
Subjective:    Patient ID: [name], female    DOB: Dec 30, 1940, 72 y.o.   MRN: [id number]  HPI  Ms.  [name] is a 72 yr old female who presents today to discuss falls.  Reports fall outside 3 weeks ago. Fell forward.  No LOC.   Second fall on Friday.  Larey Seat backwards upon getting out of bed.   She struck her head on the ground.  She denies headache.  Since that time she has had some neck pain and low back pain.  She denies associated chest pain or shortness of breath.  Hx PE- she continues coumadin and is up to date on her coumadin.     Review of Systems See HPI  Past Medical History  Diagnosis Date  . Hyperplastic colon polyp   . Hypertension   . Diabetes mellitus type II   . Osteoarthritis   . GERD (gastroesophageal reflux disease)   . Osteopenia   . Lupus     of skin see dermatologist frequently  . OA (osteoarthritis of spine)     C-spine  . DVT (deep venous thrombosis)     right leg DVT 05-17-2008  . Leg swelling     left leg 2-10, u/s showed a old clot, has a hypercoag panel,  . History of cardiovascular stress test     10/09 had CP, stress test neg, likely GI (GERD)-09/2006-had CP: stress test (-)  . 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 DVT (deep vein thrombosis) in pregnancy   . DVT of leg (deep venous thrombosis) 07/28/2011  . 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 Breast cancer     left ductal breast ca dx 2003 approx, s/p XRt x 6 weeks, released from oncology (per  patient)    History   Social History  . Marital Status: Widowed    Spouse Name: N/A    Number of Children: 3  . Years of Education: N/A   Occupational History  . Retired    Social History Main Topics  . Smoking status: Never Smoker   . Smokeless tobacco: Never Used  . Alcohol Use: No  . Drug Use: No  . Sexually Active: Not Currently   Other Topics Concern  . Not on file   Social History Narrative   Single-widow 3 children , 1 in [place] (son, daughter - [place])  [place] to [place] 2002 from [place], [employer] Use - no   tobacco-- never  Illicit Drug Use - no Pt is Jehovah's Witness-No blood productsDaughter - [name]     Past Surgical History  Procedure Date  . Cholecystectomy   . Breast biopsy     left breast  . Breast lumpectomy     left breast-ductal carcinoma in situ  . Appendectomy   . Skin cancer excision     from back     Family History  Problem Relation Age of Onset  . Diabetes Mother   . Diabetes    . Heart attack Neg Hx   . Colon cancer Neg Hx   . Breast cancer Neg Hx     Allergies  Allergen Reactions  . Fluconazole Other (See Comments)    REACTION: severe fatigue and muscle weakness  . Metformin Diarrhea  . Pioglitazone Other (See Comments)    REACTION: wt gain  . Vancomycin     Erythematous rash, hands and toes became cyanotic.      Current Outpatient Prescriptions on File Prior to Visit  Medication  Sig Dispense Refill  . B Complex-C (SUPER B COMPLEX PO) Take 1 tablet by mouth daily.      . Calcium Carbonate-Vitamin D (CALCIUM-VITAMIN D) 600-200 MG-UNIT CAPS Take 1 tablet by mouth 2 (two) times daily.       . Cholecalciferol (VITAMIN D3) LIQD Take 35,000 Units by mouth. 2 drops at bedtime      . fluocinonide cream (LIDEX) 0.05 % Apply topically 2 (two) times daily.  30 g  0  . furosemide (LASIX) 20 MG tablet TAKE 1 TABLET (20 MG TOTAL) BY MOUTH DAILY AS NEEDED.  30 tablet  2  . glucosamine-chondroitin 500-400 MG tablet Take 1 tablet by mouth 2 (two) times daily.       Marland Kitchen glucose blood test strip Use to check blood sugar 4 times a day.      Marland Kitchen glucose blood test strip FREESTYLE TEST STRIP  Use to test blood sugar 3-4 times daily.      . insulin glargine (LANTUS) 100 UNIT/ML injection Inject 40 Units into the skin at bedtime.       . insulin regular (NOVOLIN R,HUMULIN R) 100 units/mL injection Inject 22-32 Units into the skin 3 (three) times daily before meals. Sliding scale      . lisinopril (PRINIVIL,ZESTRIL) 40 MG  tablet Take 1 tablet (40 mg total) by mouth daily.  90 tablet  0  . niacin 500 MG tablet Take 500 mg by mouth 2 (two) times daily.       Marland Kitchen nystatin cream (MYCOSTATIN) Apply topically 2 (two) times daily.  30 g  0  . warfarin (COUMADIN) 5 MG tablet Take 1 1/2 tablets on Monday thru Friday, and 1 tablet on Saturday and Sunday.      Marland Kitchen amoxicillin (AMOXIL) 875 MG tablet Take 1 tablet (875 mg total) by mouth 2 (two) times daily.  14 tablet  0    BP 160/70  Pulse 83  Temp 98.3 F (36.8 C) (Oral)  Resp 16  Ht 5\' 7"  (1.702 m)  Wt 203 lb (92.08 kg)  BMI 31.79 kg/m2  SpO2 99%       Objective:   Physical Exam  Constitutional: She is oriented to person, place, and time. She appears well-developed and well-nourished. No distress.  HENT:  Head: Normocephalic and atraumatic.       L TM is noted to have significant scarring.  Some fluid bubbles noted behind TM.    Cardiovascular: Normal rate and regular rhythm.   No murmur heard. Pulmonary/Chest: Effort normal and breath sounds normal. No respiratory distress. She has no wheezes. She has no rales. She exhibits no tenderness.  Abdominal: Soft.  Lymphadenopathy:    She has no cervical adenopathy.  Neurological: She is alert and oriented to person, place, and time.  Skin: Skin is warm and dry.       Red rash beneath breasts.    Psychiatric: She has a normal mood and affect. Her behavior is normal. Judgment and thought content normal.          Assessment & Plan:

## 2012-12-20 NOTE — Assessment & Plan Note (Signed)
Will refer to ENT for further evaluation.  She was treated for L OM on 12/31.  Clinically does not appear infected at this time, but I think she still has residual effusion which is contributing to her balance issue.

## 2012-12-20 NOTE — Assessment & Plan Note (Signed)
She is requesting "pill" instead of cream or powder.  Unfortunately, she is allergic to diflucan.  Continue nystatin powder.  For generalized itching, I recommended trial of claritin.

## 2012-12-20 NOTE — Telephone Encounter (Signed)
Spoke with Dr. Christin Fudge [facility], prior auth obtained for CT head.  [phone].

## 2012-12-21 ENCOUNTER — Telehealth: Payer: Self-pay | Admitting: Family

## 2012-12-21 NOTE — Telephone Encounter (Signed)
Patient is requesting results from yesterday and she states that she is still in pain.

## 2012-12-21 NOTE — Telephone Encounter (Signed)
Reviewed results with pt.  Recommended tylenol for pain. Let me know if symptoms do not improve.  We discussed note on CT of old infarct.  Reviewed Lipids from [facility]. She remains on coumadin.

## 2013-01-06 ENCOUNTER — Ambulatory Visit (AMBULATORY_SURGERY_CENTER): Payer: Medicare Other | Admitting: *Deleted

## 2013-01-06 VITALS — Ht 67.0 in | Wt 204.4 lb

## 2013-01-06 DIAGNOSIS — Z1211 Encounter for screening for malignant neoplasm of colon: Secondary | ICD-10-CM

## 2013-01-06 DIAGNOSIS — Z860100 Personal history of colon polyps, unspecified: Secondary | ICD-10-CM

## 2013-01-06 MED ORDER — PEG-KCL-NACL-NASULF-NA ASC-C 100 G PO SOLR: ORAL | Status: DC

## 2013-01-06 NOTE — Progress Notes (Signed)
No allergy to egg or soy products  

## 2013-01-11 ENCOUNTER — Telehealth: Payer: Self-pay | Admitting: Internal Medicine

## 2013-01-11 ENCOUNTER — Telehealth: Payer: Self-pay | Admitting: *Deleted

## 2013-01-11 NOTE — Telephone Encounter (Signed)
See phone note documentation with Dr. Diamond Nickel name with same date.

## 2013-01-11 NOTE — Telephone Encounter (Signed)
Spoke with patient. She is on a limited income and unable to afford prep. Per Karen Kitchens, it is okay to use a suprep sample and instruct patient on administration. Patient will come tomorrow to pick up prep and I will instruct her on correct administration. Instructions completed.

## 2013-01-19 ENCOUNTER — Ambulatory Visit (AMBULATORY_SURGERY_CENTER): Payer: Medicare Other | Admitting: Internal Medicine

## 2013-01-19 ENCOUNTER — Encounter: Payer: Self-pay | Admitting: Internal Medicine

## 2013-01-19 ENCOUNTER — Other Ambulatory Visit: Payer: Self-pay | Admitting: Internal Medicine

## 2013-01-19 VITALS — BP 169/83 | HR 69 | Temp 98.1°F | Resp 18 | Ht 67.0 in | Wt 204.0 lb

## 2013-01-19 DIAGNOSIS — D126 Benign neoplasm of colon, unspecified: Secondary | ICD-10-CM

## 2013-01-19 DIAGNOSIS — Z1211 Encounter for screening for malignant neoplasm of colon: Secondary | ICD-10-CM

## 2013-01-19 DIAGNOSIS — Z86010 Personal history of colonic polyps: Secondary | ICD-10-CM

## 2013-01-19 DIAGNOSIS — Z7901 Long term (current) use of anticoagulants: Secondary | ICD-10-CM

## 2013-01-19 LAB — GLUCOSE, CAPILLARY
Glucose-Capillary: 291 mg/dL — ABNORMAL HIGH (ref 70–99)
Glucose-Capillary: 239 mg/dL — ABNORMAL HIGH (ref 70–99)

## 2013-01-19 MED ORDER — SODIUM CHLORIDE 0.9 % IV SOLN: 500.0000 mL | INTRAVENOUS | Status: DC

## 2013-01-19 NOTE — Progress Notes (Signed)
Called to room to assist during endoscopic procedure.  Patient ID and intended procedure confirmed with present staff. Received instructions for my participation in the procedure from the performing physician. ewm 

## 2013-01-19 NOTE — Op Note (Signed)
[facility] [address] [place], [address]   COLONOSCOPY PROCEDURE REPORT  PATIENT: Kelsey, [name]  MR#: [id number] BIRTHDATE: Dec 03, 1940 , 71  yrs. 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 MEDICATIONS: MAC sedation, administered by CRNA and propofol (Diprivan) 250mg  IV  DESCRIPTION OF PROCEDURE:   After the risks and benefits and of the procedure were explained, informed consent was obtained.  A digital rectal exam revealed no abnormalities of the rectum.    The LB [id number]  endoscope was introduced through the anus and advanced to the cecum, which was identified by both the appendix and ileocecal valve .  The quality of the prep was good, using MoviPrep .  The instrument was then slowly withdrawn as the colon was fully examined.     COLON FINDINGS: Five smooth sessile polyps ranging between 3-35mm in size were found in the descending colon.  A polypectomy was performed with a cold snare.  The resection was complete and the polyp tissue was completely retrieved.   Mild diverticulosis was noted in the descending colon.     Retroflexed views revealed no abnormalities.     The scope was then withdrawn from the patient and the procedure completed.  COMPLICATIONS: There were no complications. ENDOSCOPIC IMPRESSION: 1.   Five sessile polyps ranging between 3-60mm in size were found in the descending colon; polypectomy was performed with a cold snare 2.   Mild diverticulosis was noted in the descending colon  RECOMMENDATIONS: 1.  Await pathology results 2.  High fiber diet 3. hold Coumadin x 2 weeks, resume on 02/02/2013   REPEAT EXAM: In 5 year(s)  for  Colonoscopy.  cc:  _______________________________ eSignedHart Carwin, MD 01/19/2013 10:19 AM     PATIENT NAME:  Kelsey, [name] MR#: [id number]

## 2013-01-19 NOTE — Patient Instructions (Addendum)

## 2013-01-19 NOTE — Progress Notes (Signed)
PACU rn given report, vss, bbs=clear

## 2013-01-19 NOTE — Progress Notes (Signed)
Patient did not experience any of the following events: a burn prior to discharge; a fall within the facility; wrong site/side/patient/procedure/implant event; or a hospital transfer or hospital admission upon discharge from the facility. (G8907) Patient did not have preoperative order for IV antibiotic SSI prophylaxis. (G8918)  

## 2013-01-20 ENCOUNTER — Telehealth: Payer: Self-pay | Admitting: *Deleted

## 2013-01-20 NOTE — Telephone Encounter (Signed)
  Follow up Call-  Call back number 01/19/2013  Post procedure Call Back phone  # [phone]  Permission to leave phone message Yes     Patient questions:  Do you have a fever, pain , or abdominal swelling? no Pain Score  0 *  Have you tolerated food without any problems? yes  Have you been able to return to your normal activities? yes  Do you have any questions about your discharge instructions: Diet   no Medications  no Follow up visit  no  Do you have questions or concerns about your Care? no  Actions: * If pain score is 4 or above: No action needed, pain <4.

## 2013-01-24 ENCOUNTER — Encounter: Payer: Self-pay | Admitting: Family

## 2013-01-24 ENCOUNTER — Encounter: Payer: Self-pay | Admitting: Internal Medicine

## 2013-01-24 ENCOUNTER — Telehealth: Payer: Self-pay | Admitting: Family

## 2013-01-24 ENCOUNTER — Ambulatory Visit (INDEPENDENT_AMBULATORY_CARE_PROVIDER_SITE_OTHER): Payer: Medicare Other | Admitting: Family

## 2013-01-24 ENCOUNTER — Telehealth: Payer: Self-pay | Admitting: Internal Medicine

## 2013-01-24 VITALS — BP 146/60 | HR 76 | Temp 98.6°F | Resp 16 | Ht 67.0 in | Wt 203.0 lb

## 2013-01-24 DIAGNOSIS — M5430 Sciatica, unspecified side: Secondary | ICD-10-CM

## 2013-01-24 DIAGNOSIS — M5432 Sciatica, left side: Secondary | ICD-10-CM | POA: Insufficient documentation

## 2013-01-24 DIAGNOSIS — Z86010 Personal history of colonic polyps: Secondary | ICD-10-CM

## 2013-01-24 NOTE — Telephone Encounter (Signed)
Called [facility]- Dr. Juanda Chance is off today.  Call placed to  [facility] at [facility].  He was unavailable, but message left with assistant Peggy that I have been unable to get in touch with Dr. Juanda Chance, but lovenox should be continued for now.

## 2013-01-24 NOTE — Assessment & Plan Note (Signed)
Mild Sciatica.  She plans to try heating pad prn. I also recommended tylenol prn.

## 2013-01-24 NOTE — Patient Instructions (Addendum)
Please follow up in 3 months, sooner if problems or concerns. 

## 2013-01-24 NOTE — Assessment & Plan Note (Signed)
S/p colonoscopy last week.  She is currently being bridged with lovenox and is scheduled to meet with coumadin clinic. GI would like her off of coumadin x 2 weeks following her procedure.

## 2013-01-24 NOTE — Telephone Encounter (Signed)
Received call from Caryn Bee- PharmD at [facility] Coumadin clinic [phone].  He wanted to know if we would like pt bridged with lovenox until she resumes coumadin in 2 weeks.  I think she should be bridged, but will confirm with Dr. Juanda Chance that this is ok.

## 2013-01-24 NOTE — Assessment & Plan Note (Signed)
Stable.  Management per Endo.  

## 2013-01-24 NOTE — Progress Notes (Signed)
Subjective:    Patient ID: [name], female    DOB: 1940-12-20, 72 y.o.   MRN: [id number]  HPI  [name] is a 72 yr old female who presents today for follow up.   Back/neck pain- improved.    Hip pain-left lower back, radiates into the left buttock.    Dizziness-reports that this is resolved.    DM2- she is followed by endocrinology. She reports that her last A1C was in the 6's. Plans to start a walking routine.  Had colonoscopy- multiple polyps removed.      Review of Systems See HPI  Past Medical History  Diagnosis Date  . Hyperplastic colon polyp   . Hypertension   . Diabetes mellitus type II   . Osteoarthritis   . GERD (gastroesophageal reflux disease)   . Osteopenia   . Lupus     of skin see dermatologist frequently  . OA (osteoarthritis of spine)     C-spine  . DVT (deep venous thrombosis)     right leg DVT 05-17-2008  . Leg swelling     left leg 2-10, u/s showed a old clot, has a hypercoag panel,  . History of cardiovascular stress test     10/09 had CP, stress test neg, likely GI (GERD)-09/2006-had CP: stress test (-)  . 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 DVT (deep vein thrombosis) in pregnancy   . DVT of leg (deep venous thrombosis) 07/28/2011  . 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 Allergy   . Clotting disorder     hx of PE and DVT  . C. difficile diarrhea     hx of,   . 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 Skin cancer     skin CA    History   Social History  . Marital Status: Widowed    Spouse Name: N/A    Number of Children: 3  . Years of Education: N/A   Occupational History  . Retired    Social History Main Topics  . Smoking status: Never Smoker   . Smokeless tobacco: Never Used  . Alcohol Use: No  . Drug Use: No  . Sexually Active: Not Currently   Other Topics Concern  . Not on file   Social History Narrative   Single-widow     3 children , 1 in [place] (son, daughter - [place])    [place] to [place] from [place], [place]   Alcohol Use - no      tobacco-- never     Illicit Drug Use - no    Pt is Jehovah's Witness-No blood products   Daughter - [name]           Past Surgical History  Procedure Laterality Date  . Cholecystectomy    . Breast biopsy      left breast and right breast  . Breast lumpectomy      left breast-ductal carcinoma in situ  . Appendectomy    . Skin cancer excision      from back     Family History  Problem Relation Age of Onset  . Diabetes Mother   . Diabetes    . Heart attack Neg Hx   . Colon cancer Neg Hx   . Breast cancer Neg Hx   . Esophageal cancer Neg Hx   . Rectal cancer Neg Hx   .  Stomach cancer Neg Hx     Allergies  Allergen Reactions  . Fluconazole Other (See Comments)    REACTION: severe fatigue and muscle weakness  . Metformin Diarrhea  . Pioglitazone Other (See Comments)    REACTION: wt gain  . Vancomycin     Erythematous rash, hands and toes became cyanotic.      Current Outpatient Prescriptions on File Prior to Visit  Medication Sig Dispense Refill  . B Complex-C (SUPER B COMPLEX PO) Take 1 tablet by mouth daily.      . Calcium Carbonate-Vitamin D (CALCIUM-VITAMIN D) 600-200 MG-UNIT CAPS Take 1 tablet by mouth 2 (two) times daily.       . Cholecalciferol (VITAMIN D3) LIQD Take 35,000 Units by mouth. 2 drops at bedtime      . furosemide (LASIX) 20 MG tablet TAKE 1 TABLET (20 MG TOTAL) BY MOUTH DAILY AS NEEDED.  30 tablet  2  . glucosamine-chondroitin 500-400 MG tablet Take 1 tablet by mouth 2 (two) times daily.       Marland Kitchen glucose blood test strip Use to check blood sugar 4 times a day.      Marland Kitchen glucose blood test strip FREESTYLE TEST STRIP  Use to test blood sugar 3-4 times daily.      . insulin glargine (LANTUS) 100 UNIT/ML injection Inject 40 Units into the skin at bedtime.       . insulin regular (NOVOLIN R,HUMULIN R) 100 units/mL injection Inject  22-32 Units into the skin 3 (three) times daily before meals. Sliding scale      . lisinopril (PRINIVIL,ZESTRIL) 40 MG tablet Take 1 tablet (40 mg total) by mouth daily.  90 tablet  0  . loratadine (CLARITIN) 10 MG tablet Take 1 tablet (10 mg total) by mouth daily.  30 tablet  11  . niacin 500 MG tablet Take 500 mg by mouth 2 (two) times daily.       Marland Kitchen enoxaparin (LOVENOX) 40 MG/0.4ML SOLN Inject 0.4 mLs (40 mg total) into the skin daily.  6.4 mL  0  . warfarin (COUMADIN) 5 MG tablet Take 1 1/2 tablets on Monday thru Friday, and 1 tablet on Saturday and Sunday.       No current facility-administered medications on file prior to visit.    BP 146/60  Pulse 76  Temp(Src) 98.6 F (37 C) (Oral)  Resp 16  Ht 5\' 7"  (1.702 m)  Wt 203 lb (92.08 kg)  BMI 31.79 kg/m2  SpO2 99%       Objective:   Physical Exam  Constitutional: She is oriented to person, place, and time. She appears well-developed and well-nourished. No distress.  HENT:  Head: Normocephalic and atraumatic.  Cardiovascular: Normal rate and regular rhythm.   No murmur heard. Pulmonary/Chest: Effort normal and breath sounds normal. No respiratory distress. She has no wheezes. She has no rales. She exhibits no tenderness.  Musculoskeletal: She exhibits no edema.  Lymphadenopathy:    She has no cervical adenopathy.  Neurological: She is alert and oriented to person, place, and time.  Skin: Skin is warm.  + hematoma/echymosis left lower abdomen at lovenox injection site.   Psychiatric: She has a normal mood and affect. Her behavior is normal. Judgment and thought content normal.        Assessment & Plan:

## 2013-01-25 ENCOUNTER — Telehealth: Payer: Self-pay | Admitting: Family

## 2013-01-25 NOTE — Telephone Encounter (Signed)
Message copied by Sandford Craze on Tue Jan 25, 2013 10:42 AM ------      Message from: Hart Carwin      Created: Mon Jan 24, 2013  9:05 PM       Melissa, she should continue on Lovenox while she is off Coumadin till  she is back on Coumadin. I told her that after her colonoscopy. She said she had only few doses left but said she could get more. Thanx for asking. Dora      ----- Message -----         From: Sandford Craze, NP         Sent: 01/24/2013  12:49 PM           To: Hart Carwin, MD            Dr. Juanda Chance-             I got a call from coumadin clinic on her.  I know that you want her off coumadin x 2 weeks. She is high risk for recurrent PE/DVT.  Any contraindication for continuing lovenox bridge in the meantime from your end?              Thanks,            Melissa       ------

## 2013-01-25 NOTE — Telephone Encounter (Signed)
Spoke with [name] and gave him told him about the note with Dr. Juanda Chance and Sandford Craze. Faxed a copy of note to [place] at [phone] for his records.

## 2013-01-25 NOTE — Telephone Encounter (Signed)
Left a message for Caryn Bee to call me back.

## 2013-02-21 ENCOUNTER — Encounter: Payer: Self-pay | Admitting: Family

## 2013-03-10 ENCOUNTER — Encounter: Payer: Self-pay | Admitting: Family Medicine

## 2013-03-10 ENCOUNTER — Ambulatory Visit (HOSPITAL_BASED_OUTPATIENT_CLINIC_OR_DEPARTMENT_OTHER)
Admission: RE | Admit: 2013-03-10 | Discharge: 2013-03-10 | Disposition: A | Discharge status: Home / Self Care | Payer: Medicare Other | Source: Ambulatory Visit | Attending: Family Medicine | Admitting: Family Medicine

## 2013-03-10 ENCOUNTER — Ambulatory Visit (INDEPENDENT_AMBULATORY_CARE_PROVIDER_SITE_OTHER): Payer: Medicare Other | Admitting: Family Medicine

## 2013-03-10 VITALS — BP 138/72 | HR 77 | Temp 98.1°F | Ht 67.0 in | Wt 201.1 lb

## 2013-03-10 DIAGNOSIS — E119 Type 2 diabetes mellitus without complications: Secondary | ICD-10-CM | POA: Insufficient documentation

## 2013-03-10 DIAGNOSIS — M25562 Pain in left knee: Secondary | ICD-10-CM

## 2013-03-10 DIAGNOSIS — M79605 Pain in left leg: Secondary | ICD-10-CM

## 2013-03-10 DIAGNOSIS — R609 Edema, unspecified: Secondary | ICD-10-CM | POA: Insufficient documentation

## 2013-03-10 DIAGNOSIS — R209 Unspecified disturbances of skin sensation: Secondary | ICD-10-CM | POA: Insufficient documentation

## 2013-03-10 DIAGNOSIS — M25569 Pain in unspecified knee: Secondary | ICD-10-CM

## 2013-03-10 DIAGNOSIS — I10 Essential (primary) hypertension: Secondary | ICD-10-CM

## 2013-03-10 DIAGNOSIS — Z86718 Personal history of other venous thrombosis and embolism: Secondary | ICD-10-CM | POA: Insufficient documentation

## 2013-03-10 DIAGNOSIS — B354 Tinea corporis: Secondary | ICD-10-CM

## 2013-03-10 DIAGNOSIS — M79609 Pain in unspecified limb: Secondary | ICD-10-CM

## 2013-03-10 MED ORDER — FLUCONAZOLE 150 MG PO TABS: 150.0000 mg | ORAL_TABLET | Freq: Once | ORAL | Status: DC

## 2013-03-10 NOTE — Patient Instructions (Signed)
Elevate feet above heart at least 15 minutes 3 times a day Lasix 20 mg tabs 2 tabs po daily x 5 days and return for recheck next week     Peripheral Edema You have swelling in your legs (peripheral edema). This swelling is due to excess accumulation of salt and water in your body. Edema may be a sign of heart, kidney or liver disease, or a side effect of a medication. It may also be due to problems in the leg veins. Elevating your legs and using special support stockings may be very helpful, if the cause of the swelling is due to poor venous circulation. Avoid long periods of standing, whatever the cause. Treatment of edema depends on identifying the cause. Chips, pretzels, pickles and other salty foods should be avoided. Restricting salt in your diet is almost always needed. Water pills (diuretics) are often used to remove the excess salt and water from your body via urine. These medicines prevent the kidney from reabsorbing sodium. This increases urine flow. Diuretic treatment may also result in lowering of potassium levels in your body. Potassium supplements may be needed if you have to use diuretics daily. Daily weights can help you keep track of your progress in clearing your edema. You should call your caregiver for follow up care as recommended. SEEK IMMEDIATE MEDICAL CARE IF:   You have increased swelling, pain, redness, or heat in your legs.  You develop shortness of breath, especially when lying down.  You develop chest or abdominal pain, weakness, or fainting.  You have a fever. Document Released: 12/11/2004 Document Revised: 01/26/2012 Document Reviewed: 11/21/2009 [facility] Patient Information 2013 [place], [place].

## 2013-03-11 ENCOUNTER — Telehealth: Payer: Self-pay

## 2013-03-11 MED ORDER — FUROSEMIDE 20 MG PO TABS: ORAL_TABLET | ORAL | Status: DC

## 2013-03-11 NOTE — Telephone Encounter (Signed)
OK to refill Lasix 20 mg 1 tab po daily prn, Disp #30 , 2 rf

## 2013-03-11 NOTE — Telephone Encounter (Signed)
Refilled Lasix medication

## 2013-03-11 NOTE — Telephone Encounter (Signed)
Please Advise....  Patient would like refill on her Lasix medication

## 2013-03-12 ENCOUNTER — Encounter: Payer: Self-pay | Admitting: Family Medicine

## 2013-03-12 DIAGNOSIS — M79605 Pain in left leg: Secondary | ICD-10-CM | POA: Insufficient documentation

## 2013-03-12 NOTE — Assessment & Plan Note (Signed)
Well controlled on current meds. No changes

## 2013-03-12 NOTE — Assessment & Plan Note (Addendum)
And swelling, ultrasound for DVT was negative, encouraged Lasix 40 mg daily for several days, elevate foot above heart and maintain DASH diet. Report if pain does not improve. Paitne had her INR checked yesterday at [facility] and reports it was 2.8

## 2013-03-12 NOTE — Progress Notes (Signed)
Patient ID: [name], female   DOB: November 11, 1941, 72 y.o.   MRN: [id number] [name] [id number] 08/20/1941 03/12/2013      Progress Note-Follow Up  Subjective  Chief Complaint  Chief Complaint  Patient presents with  . Leg Pain    left X 2 days- swelling    HPI  Female who is in today noting swelling and pain in left lower extremity. It's been hurting for about 2 days and more swollen than usual. She did injure that leg many years ago but not recently. No long trips or injury. No chest pain or palpitations. No shortness of breath. She follows with [facility] for her Coumadin and her INR yesterday was 2.8. She takes 5 mg on Saturdays and Sundays and 7.5 mg the other days. No recent illness, fevers or headaches  Past Medical History  Diagnosis Date  . Hyperplastic colon polyp   . Hypertension   . Diabetes mellitus type II   . Osteoarthritis   . GERD (gastroesophageal reflux disease)   . Osteopenia   . Lupus     of skin see dermatologist frequently  . OA (osteoarthritis of spine)     C-spine  . DVT (deep venous thrombosis)     right leg DVT 05-17-2008  . Leg swelling     left leg 2-10, u/s showed a old clot, has a hypercoag panel,  . History of cardiovascular stress test     10 /09 had CP, stress test neg, likely GI (GERD)-09/2006-had CP: stress test (-)  . 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 DVT (deep vein thrombosis) in pregnancy   . DVT of leg (deep venous thrombosis) 07/28/2011  . 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 Allergy   . Clotting disorder     hx of PE and DVT  . C. difficile diarrhea     hx of,   . 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 Skin cancer     skin CA  . Left leg pain 03/12/2013    Past Surgical History  Procedure Laterality Date  . Cholecystectomy    . Breast biopsy      left breast and right breast  . Breast lumpectomy      left breast-ductal  carcinoma in situ  . Appendectomy    . Skin cancer excision      from back     Family History  Problem Relation Age of Onset  . Diabetes Mother   . Diabetes    . Heart attack Neg Hx   . Colon cancer Neg Hx   . Breast cancer Neg Hx   . Esophageal cancer Neg Hx   . Rectal cancer Neg Hx   . Stomach cancer Neg Hx     History   Social History  . Marital Status: Widowed    Spouse Name: N/A    Number of Children: 3  . Years of Education: N/A   Occupational History  . Retired    Social History Main Topics  . Smoking status: Never Smoker   . Smokeless tobacco: Never Used  . Alcohol Use: No  . Drug Use: No  . Sexually Active: Not Currently   Other Topics Concern  . Not on file   Social History Narrative   Single-widow    3 children , 1 in [place] (son, daughter - [name])  Moved to [place] 2002 from [place], [place]   Alcohol Use - no      tobacco-- never     Illicit Drug Use - no    Pt is Jehovah's Witness-No blood products   Daughter - [name]           Current Outpatient Prescriptions on File Prior to Visit  Medication Sig Dispense Refill  . B Complex-C (SUPER B COMPLEX PO) Take 1 tablet by mouth daily.      . Calcium Carbonate-Vitamin D (CALCIUM-VITAMIN D) 600-200 MG-UNIT CAPS Take 1 tablet by mouth 2 (two) times daily.       . Cholecalciferol (VITAMIN D3) LIQD Take 35,000 Units by mouth. 2 drops at bedtime      . enoxaparin (LOVENOX) 40 MG/0.4ML SOLN Inject 0.4 mLs (40 mg total) into the skin daily.  6.4 mL  0  . Enoxaparin Sodium (LOVENOX IJ) Inject as directed daily.      Marland Kitchen glucosamine-chondroitin 500-400 MG tablet Take 1 tablet by mouth 2 (two) times daily.       Marland Kitchen glucose blood test strip Use to check blood sugar 4 times a day.      Marland Kitchen glucose blood test strip FREESTYLE TEST STRIP  Use to test blood sugar 3-4 times daily.      . insulin glargine (LANTUS) 100 UNIT/ML injection Inject 40 Units into the skin at bedtime.       . insulin regular (NOVOLIN  R,HUMULIN R) 100 units/mL injection Inject 22-32 Units into the skin 3 (three) times daily before meals. Sliding scale      . lisinopril (PRINIVIL,ZESTRIL) 40 MG tablet Take 1 tablet (40 mg total) by mouth daily.  90 tablet  0  . loratadine (CLARITIN) 10 MG tablet Take 1 tablet (10 mg total) by mouth daily.  30 tablet  11  . niacin 500 MG tablet Take 500 mg by mouth 2 (two) times daily.       Marland Kitchen warfarin (COUMADIN) 5 MG tablet Take 1 1/2 tablets on Monday thru Friday, and 1 tablet on Saturday and Sunday.       No current facility-administered medications on file prior to visit.    Allergies  Allergen Reactions  . Fluconazole Other (See Comments)    REACTION: severe fatigue and muscle weakness  . Metformin Diarrhea  . Pioglitazone Other (See Comments)    REACTION: wt gain  . Vancomycin     Erythematous rash, hands and toes became cyanotic.      Review of Systems  Review of Systems  Constitutional: Negative for fever and malaise/fatigue.  HENT: Negative for congestion.   Eyes: Negative for discharge.  Respiratory: Negative for shortness of breath.   Cardiovascular: Positive for leg swelling. Negative for chest pain and palpitations.  Gastrointestinal: Negative for nausea, abdominal pain and diarrhea.  Genitourinary: Negative for dysuria.  Musculoskeletal: Positive for myalgias and joint pain. Negative for falls.  Skin: Negative for rash.  Neurological: Negative for loss of consciousness and headaches.  Endo/Heme/Allergies: Negative for polydipsia.  Psychiatric/Behavioral: Negative for depression and suicidal ideas. The patient is not nervous/anxious and does not have insomnia.     Objective  BP 138/72  Pulse 77  Temp(Src) 98.1 F (36.7 C) (Oral)  Ht 5\' 7"  (1.702 m)  Wt 201 lb 1.9 oz (91.227 kg)  BMI 31.49 kg/m2  SpO2 96%  Physical Exam  Physical Exam  Constitutional: She is oriented to person, place, and time and well-developed, well-nourished, and in no  distress. No  distress.  HENT:  Head: Normocephalic and atraumatic.  Eyes: Conjunctivae are normal.  Neck: Neck supple. No thyromegaly present.  Cardiovascular: Normal rate, regular rhythm and normal heart sounds.   No murmur heard. Pulmonary/Chest: Effort normal and breath sounds normal. She has no wheezes.  Abdominal: She exhibits no distension and no mass.  Musculoskeletal: She exhibits no edema.  Lymphadenopathy:    She has no cervical adenopathy.  Neurological: She is alert and oriented to person, place, and time.  Skin: Skin is warm and dry. No rash noted. She is not diaphoretic.  Psychiatric: Memory, affect and judgment normal.    Lab Results  Component Value Date   TSH 1.467 04/06/2012   Lab Results  Component Value Date   WBC 7.9 12/20/2012   HGB 12.2 12/20/2012   HCT 36.6 12/20/2012   MCV 81.2 12/20/2012   PLT 185 12/20/2012   Lab Results  Component Value Date   CREATININE 0.83 12/20/2012   BUN 12 12/20/2012   NA 141 12/20/2012   K 4.1 12/20/2012   CL 106 12/20/2012   CO2 29 12/20/2012   Lab Results  Component Value Date   ALT 30 02/27/2012   AST 25 02/27/2012   ALKPHOS 83 02/27/2012   BILITOT 0.5 02/27/2012   Lab Results  Component Value Date   CHOL 198 12/21/2009   Lab Results  Component Value Date   HDL 32 12/21/2009   Lab Results  Component Value Date   LDLCALC 125 12/21/2009   Lab Results  Component Value Date   TRIG 201* 11/06/2009   Lab Results  Component Value Date   CHOLHDL 5.4 11/06/2009     Assessment & Plan  HYPERTENSION Well controlled on current meds. No changes  Left leg pain And swelling, ultrasound for DVT was negative, encouraged Lasix 40 mg daily for several days, elevate foot above heart and maintain DASH diet. Report if pain does not improve. Paitne had her INR checked yesterday at [facility] and reports it was 2.8

## 2013-03-17 ENCOUNTER — Encounter: Payer: Self-pay | Admitting: Family Medicine

## 2013-03-17 ENCOUNTER — Ambulatory Visit (INDEPENDENT_AMBULATORY_CARE_PROVIDER_SITE_OTHER): Payer: Medicare Other | Admitting: Family Medicine

## 2013-03-17 VITALS — BP 144/74 | HR 97 | Temp 98.3°F | Ht 67.0 in | Wt 203.0 lb

## 2013-03-17 DIAGNOSIS — I10 Essential (primary) hypertension: Secondary | ICD-10-CM

## 2013-03-17 DIAGNOSIS — H60399 Other infective otitis externa, unspecified ear: Secondary | ICD-10-CM

## 2013-03-17 DIAGNOSIS — B354 Tinea corporis: Secondary | ICD-10-CM

## 2013-03-17 DIAGNOSIS — H60391 Other infective otitis externa, right ear: Secondary | ICD-10-CM

## 2013-03-17 DIAGNOSIS — E785 Hyperlipidemia, unspecified: Secondary | ICD-10-CM

## 2013-03-17 DIAGNOSIS — H6091 Unspecified otitis externa, right ear: Secondary | ICD-10-CM

## 2013-03-17 DIAGNOSIS — L538 Other specified erythematous conditions: Secondary | ICD-10-CM

## 2013-03-17 DIAGNOSIS — L304 Erythema intertrigo: Secondary | ICD-10-CM

## 2013-03-17 DIAGNOSIS — R609 Edema, unspecified: Secondary | ICD-10-CM

## 2013-03-17 MED ORDER — NYSTATIN-TRIAMCINOLONE 100000-0.1 UNIT/GM-% EX OINT: TOPICAL_OINTMENT | Freq: Two times a day (BID) | CUTANEOUS | Status: DC | PRN

## 2013-03-17 MED ORDER — NEOMYCIN-POLYMYXIN-HC 3.5-10000-1 OT SOLN: 3.0000 [drp] | Freq: Three times a day (TID) | OTIC | Status: DC

## 2013-03-17 MED ORDER — HYDROCHLOROTHIAZIDE 12.5 MG PO CAPS: 12.5000 mg | ORAL_CAPSULE | Freq: Every day | ORAL | Status: DC

## 2013-03-17 MED ORDER — FLUCONAZOLE 150 MG PO TABS: ORAL_TABLET | ORAL | Status: DC

## 2013-03-17 NOTE — Patient Instructions (Addendum)

## 2013-03-19 ENCOUNTER — Encounter: Payer: Self-pay | Admitting: Family Medicine

## 2013-03-19 DIAGNOSIS — H6090 Unspecified otitis externa, unspecified ear: Secondary | ICD-10-CM | POA: Insufficient documentation

## 2013-03-19 NOTE — Assessment & Plan Note (Signed)
Mild, avoid trans fats

## 2013-03-19 NOTE — Assessment & Plan Note (Signed)
Given a longer course of Diflucan and started on Mycolog cream bid.

## 2013-03-19 NOTE — Progress Notes (Signed)
Patient ID: [name], female   DOB: July 07, 1941, 72 y.o.   MRN: [id number] [name] [id number] Jul 13, 1941 03/19/2013      Progress Note-Follow Up  Subjective  Chief Complaint  Chief Complaint  Patient presents with  . Follow-up    1 week     HPI  Patient is a 72 year old Caucasian female who is here in followup. She continues to struggle with some pedal edema although it is slightly improved. Pain is associated with this time. She is having some crusting and itching over her right external canal. She denies any other acute complaints. No congestion. No chest pain, palpitations, shortness of breath, GI or GU complaints.  Past Medical History  Diagnosis Date  . Hyperplastic colon polyp   . Hypertension   . Diabetes mellitus type II   . Osteoarthritis   . GERD (gastroesophageal reflux disease)   . Osteopenia   . Lupus     of skin see dermatologist frequently  . OA (osteoarthritis of spine)     C-spine  . DVT (deep venous thrombosis)     right leg DVT 05-17-2008  . Leg swelling     left leg 2-10, u/s showed a old clot, has a hypercoag panel,  . History of cardiovascular stress test     10/09 had CP, stress test neg, likely GI (GERD)-09/2006-had CP: stress test (-)  . 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 DVT (deep vein thrombosis) in pregnancy   . DVT of leg (deep venous thrombosis) 07/28/2011  . 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 Allergy   . Clotting disorder     hx of PE and DVT  . C. difficile diarrhea     hx of,   . 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 Skin cancer     skin CA  . Left leg pain 03/12/2013  . Otitis externa 03/19/2013    right    Past Surgical History  Procedure Laterality Date  . Cholecystectomy    . Breast biopsy      left breast and right breast  . Breast lumpectomy      left breast-ductal carcinoma in situ  . Appendectomy    . Skin cancer  excision      from back     Family History  Problem Relation Age of Onset  . Diabetes Mother   . Diabetes    . Heart attack Neg Hx   . Colon cancer Neg Hx   . Breast cancer Neg Hx   . Esophageal cancer Neg Hx   . Rectal cancer Neg Hx   . Stomach cancer Neg Hx     History   Social History  . Marital Status: Widowed    Spouse Name: N/A    Number of Children: 3  . Years of Education: N/A   Occupational History  . Retired    Social History Main Topics  . Smoking status: Never Smoker   . Smokeless tobacco: Never Used  . Alcohol Use: No  . Drug Use: No  . Sexually Active: Not Currently   Other Topics Concern  . Not on file   Social History Narrative   Single-widow    3 children , 1 in [place] (son, daughter - [name])    [place] from [place], [place]   Alcohol Use - no  tobacco-- never     Illicit Drug Use - no    Pt is Jehovah's Witness-No blood products   Daughter - [name]           Current Outpatient Prescriptions on File Prior to Visit  Medication Sig Dispense Refill  . B Complex-C (SUPER B COMPLEX PO) Take 1 tablet by mouth daily.      . Calcium Carbonate-Vitamin D (CALCIUM-VITAMIN D) 600-200 MG-UNIT CAPS Take 1 tablet by mouth 2 (two) times daily.       . Cholecalciferol (VITAMIN D3) LIQD Take 35,000 Units by mouth. 2 drops at bedtime      . enoxaparin (LOVENOX) 40 MG/0.4ML SOLN Inject 0.4 mLs (40 mg total) into the skin daily.  6.4 mL  0  . Enoxaparin Sodium (LOVENOX IJ) Inject as directed daily.      Marland Kitchen glucosamine-chondroitin 500-400 MG tablet Take 1 tablet by mouth 2 (two) times daily.       Marland Kitchen glucose blood test strip Use to check blood sugar 4 times a day.      Marland Kitchen glucose blood test strip FREESTYLE TEST STRIP  Use to test blood sugar 3-4 times daily.      . insulin glargine (LANTUS) 100 UNIT/ML injection Inject 40 Units into the skin at bedtime.       . insulin regular (NOVOLIN R,HUMULIN R) 100 units/mL injection Inject 22-32 Units  into the skin 3 (three) times daily before meals. Sliding scale      . lisinopril (PRINIVIL,ZESTRIL) 40 MG tablet Take 1 tablet (40 mg total) by mouth daily.  90 tablet  0  . loratadine (CLARITIN) 10 MG tablet Take 1 tablet (10 mg total) by mouth daily.  30 tablet  11  . niacin 500 MG tablet Take 500 mg by mouth 2 (two) times daily.       Marland Kitchen warfarin (COUMADIN) 5 MG tablet Take 1 1/2 tablets on Monday thru Friday, and 1 tablet on Saturday and Sunday.      . furosemide (LASIX) 20 MG tablet TAKE 1 TABLET (20 MG TOTAL) BY MOUTH DAILY AS NEEDED.  30 tablet  2   No current facility-administered medications on file prior to visit.    Allergies  Allergen Reactions  . Fluconazole Other (See Comments)    REACTION: severe fatigue and muscle weakness  . Metformin Diarrhea  . Pioglitazone Other (See Comments)    REACTION: wt gain  . Vancomycin     Erythematous rash, hands and toes became cyanotic.      Review of Systems  Review of Systems  Constitutional: Negative for fever and malaise/fatigue.  HENT: Positive for ear discharge. Negative for congestion.   Eyes: Negative for discharge.  Respiratory: Negative for shortness of breath.   Cardiovascular: Positive for leg swelling. Negative for chest pain and palpitations.  Gastrointestinal: Negative for nausea, abdominal pain and diarrhea.  Genitourinary: Negative for dysuria.  Musculoskeletal: Negative for falls.  Skin: Negative for rash.  Neurological: Negative for loss of consciousness and headaches.  Endo/Heme/Allergies: Negative for polydipsia.  Psychiatric/Behavioral: Negative for depression and suicidal ideas. The patient is not nervous/anxious and does not have insomnia.     Objective  BP 144/74  Pulse 97  Temp(Src) 98.3 F (36.8 C) (Oral)  Ht 5\' 7"  (1.702 m)  Wt 203 lb (92.08 kg)  BMI 31.79 kg/m2  SpO2 97%  Physical Exam  Physical Exam  Constitutional: She is oriented to person, place, and time and well-developed,  well-nourished, and in  no distress. No distress.  HENT:  Head: Normocephalic and atraumatic.  Right Ear: External ear normal.  Left Ear: External ear normal.  Nose: Nose normal.  Mouth/Throat: Oropharynx is clear and moist. No oropharyngeal exudate.  Right external canal crusty, skin mildly erythematous. LM ruptured but not erythematous, no discharge  Eyes: Conjunctivae are normal. Pupils are equal, round, and reactive to light. Right eye exhibits no discharge. Left eye exhibits no discharge. No scleral icterus.  Neck: Normal range of motion. Neck supple. No thyromegaly present.  Cardiovascular: Normal rate, regular rhythm, normal heart sounds and intact distal pulses.   No murmur heard. Pulmonary/Chest: Effort normal and breath sounds normal. No respiratory distress. She has no wheezes. She has no rales.  Abdominal: Soft. Bowel sounds are normal. She exhibits no distension and no mass. There is no tenderness.  Musculoskeletal: Normal range of motion. She exhibits edema. She exhibits no tenderness.  1-2 plus edema b/l lower extremity  Lymphadenopathy:    She has no cervical adenopathy.  Neurological: She is alert and oriented to person, place, and time. She has normal reflexes. No cranial nerve deficit. Coordination normal.  Skin: Skin is warm and dry. No rash noted. She is not diaphoretic.  Psychiatric: Mood, memory and affect normal.    Lab Results  Component Value Date   TSH 1.467 04/06/2012   Lab Results  Component Value Date   WBC 7.9 12/20/2012   HGB 12.2 12/20/2012   HCT 36.6 12/20/2012   MCV 81.2 12/20/2012   PLT 185 12/20/2012   Lab Results  Component Value Date   CREATININE 0.83 12/20/2012   BUN 12 12/20/2012   NA 141 12/20/2012   K 4.1 12/20/2012   CL 106 12/20/2012   CO2 29 12/20/2012   Lab Results  Component Value Date   ALT 30 02/27/2012   AST 25 02/27/2012   ALKPHOS 83 02/27/2012   BILITOT 0.5 02/27/2012   Lab Results  Component Value Date   CHOL 198 12/21/2009   Lab Results   Component Value Date   HDL 32 12/21/2009   Lab Results  Component Value Date   LDLCALC 125 12/21/2009   Lab Results  Component Value Date   TRIG 201* 11/06/2009   Lab Results  Component Value Date   CHOLHDL 5.4 11/06/2009     Assessment & Plan  HYPERTENSION Well controlled on current meds. No changes  Otitis externa Cortisporin otic is prescribed  Edema Continue Lasix prn, minimize sodium, elevate feet, start compression hose.   Intertrigo Given a longer course of Diflucan and started on Mycolog cream bid.   HYPERLIPIDEMIA Mild, avoid trans fats

## 2013-03-19 NOTE — Assessment & Plan Note (Signed)
Continue Lasix prn, minimize sodium, elevate feet, start compression hose.

## 2013-03-19 NOTE — Assessment & Plan Note (Signed)
Well controlled on current meds. No changes

## 2013-03-19 NOTE — Assessment & Plan Note (Signed)
Cortisporin otic is prescribed 

## 2013-03-24 ENCOUNTER — Telehealth: Payer: Self-pay

## 2013-03-24 NOTE — Telephone Encounter (Signed)
Spoke with patient and reassured that her HCTZ was added for mild HTN and edema not for CHF. 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

## 2013-03-24 NOTE — Telephone Encounter (Signed)
Pt left a message yesterday afternoon (I was out of the office) asking that MD return her call? Pt stated in the email that Dr Abner Greenspan told her that she could call the office anytime she wanted and she would return her call. Pt is questioning the HCTZ 12.5 mg.? Pt states that when she picked up this medication it stated it was associated w/ CHF and she does not have this. Pt doesn't want anyone to return her call but Dr Abner Greenspan. Please advise?

## 2013-03-29 ENCOUNTER — Other Ambulatory Visit: Payer: Self-pay | Admitting: Family

## 2013-04-08 ENCOUNTER — Other Ambulatory Visit: Payer: Self-pay | Admitting: Family

## 2013-04-08 DIAGNOSIS — R921 Mammographic calcification found on diagnostic imaging of breast: Secondary | ICD-10-CM

## 2013-04-16 IMAGING — US US EXTREM LOW VENOUS BILAT
1 series · 13 of 24 positions shown · non-contrast
Comparison: Prior examination 07/28/2011.

CLINICAL DATA: Left greater than right leg pain and swelling.
History of DVT on Coumadin.

BILATERAL LOWER EXTREMITY VENOUS DUPLEX ULTRASOUND
TECHNIQUE: Gray-scale sonography with graded compression, as well
as color Doppler and duplex ultrasound, were performed to evaluate
the deep venous system of both lower extremities from the level of
the common femoral vein through the popliteal and proximal calf
veins.  Spectral Doppler was utilized to evaluate flow at rest and
with distal augmentation maneuvers.

[Series 1: us extrem low venous bilat · 13 of 31 slices shown]
[im 1/31]
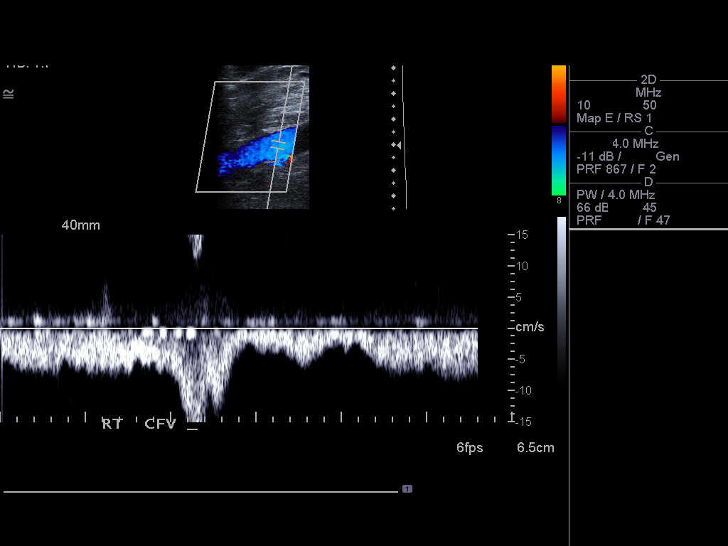
[im 3/31]
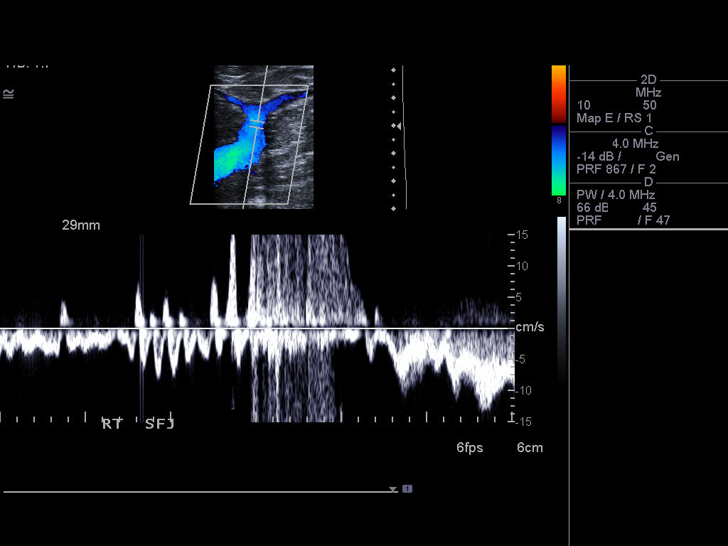
[im 6/31]
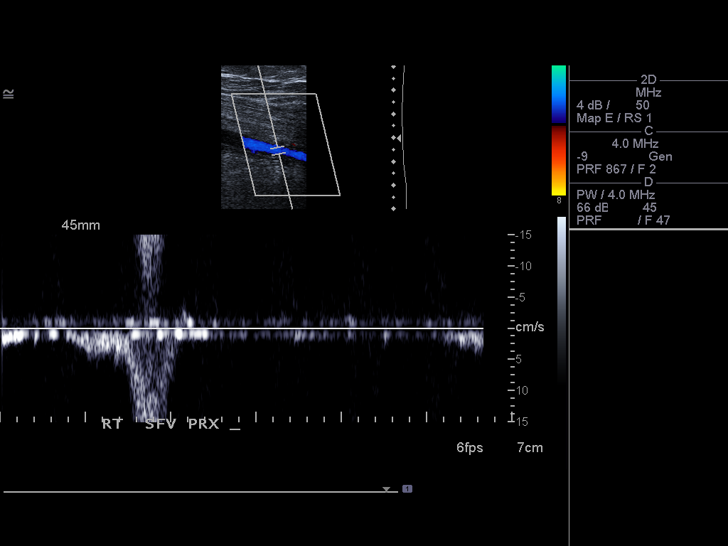
[im 8/31]
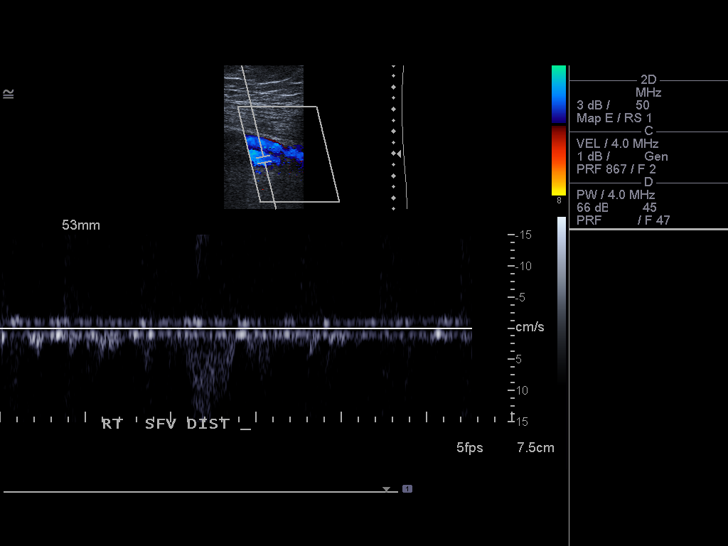
[im 11/31]
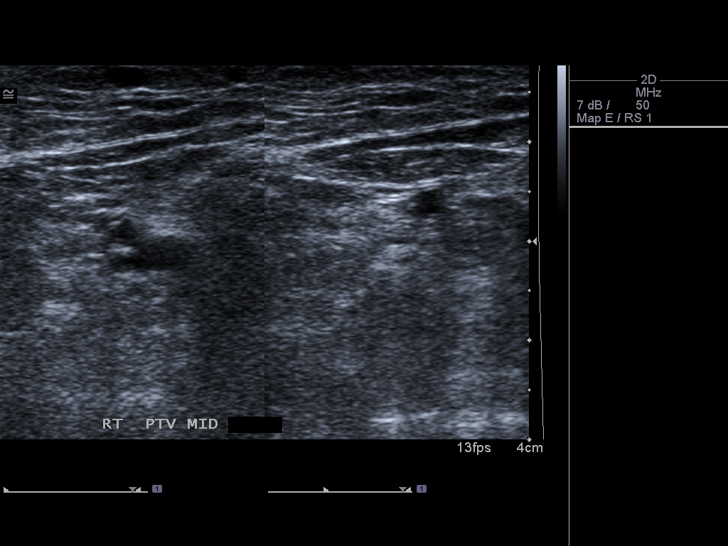
[im 14/31]
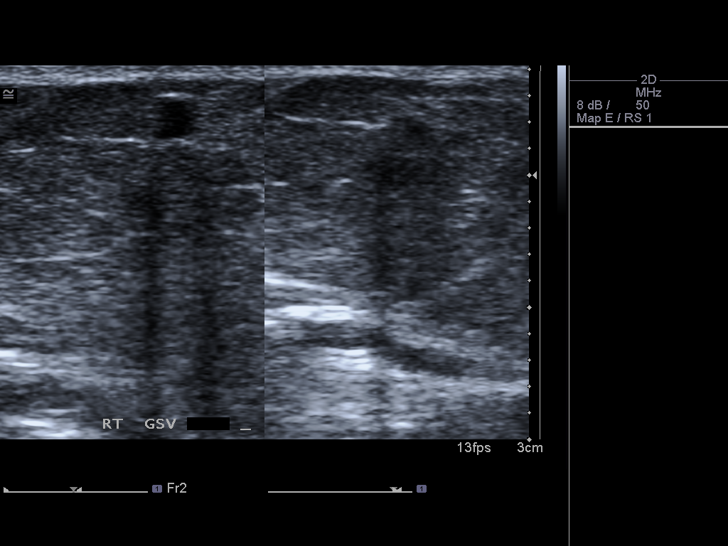
[im 16/31]
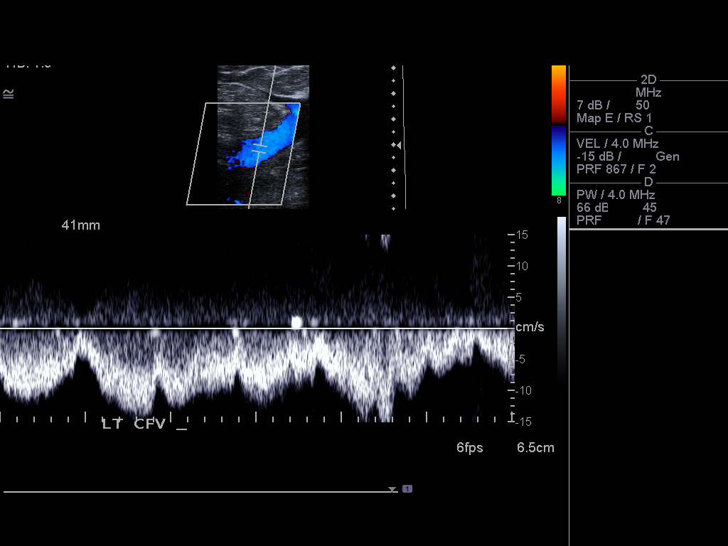
[im 17/31]
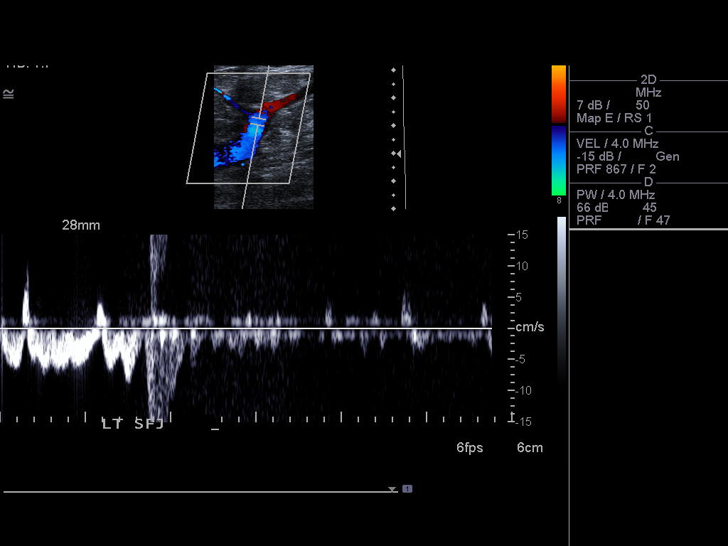
[im 20/31]
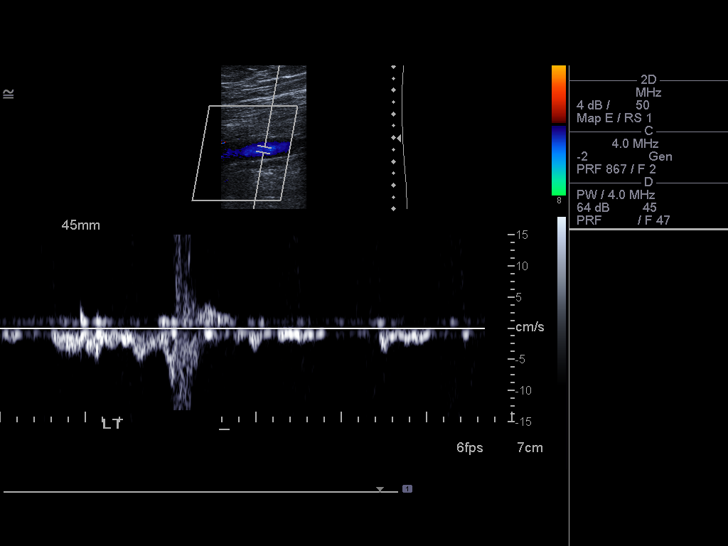
[im 23/31]
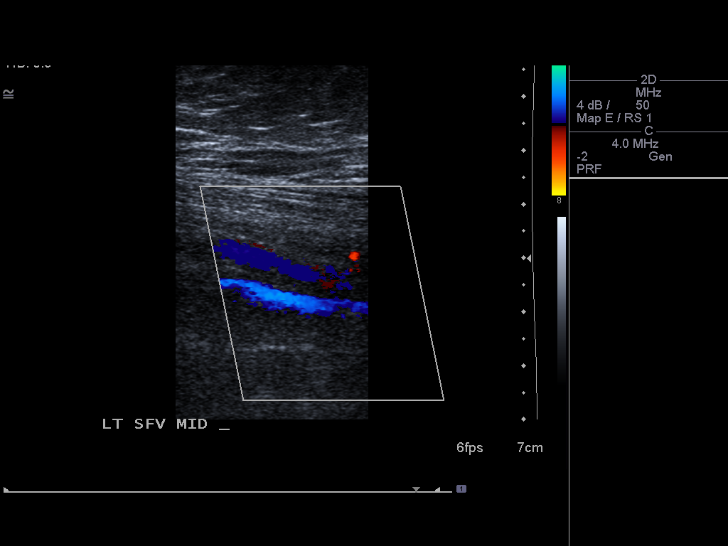
[im 25/31]
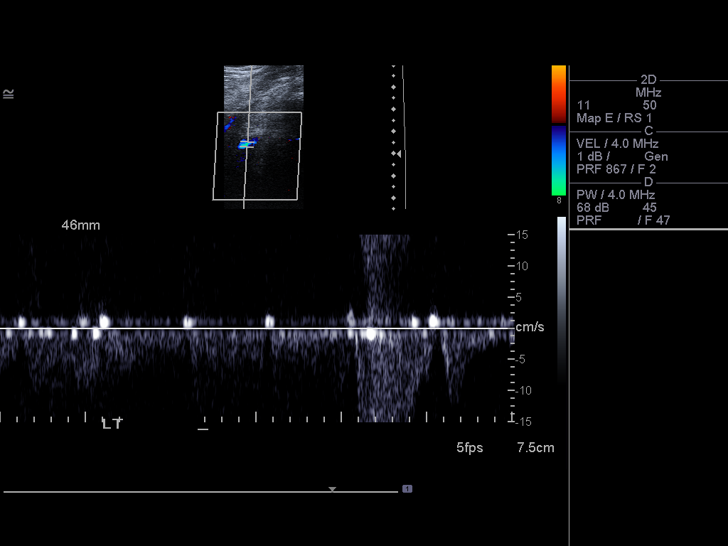
[im 28/31]
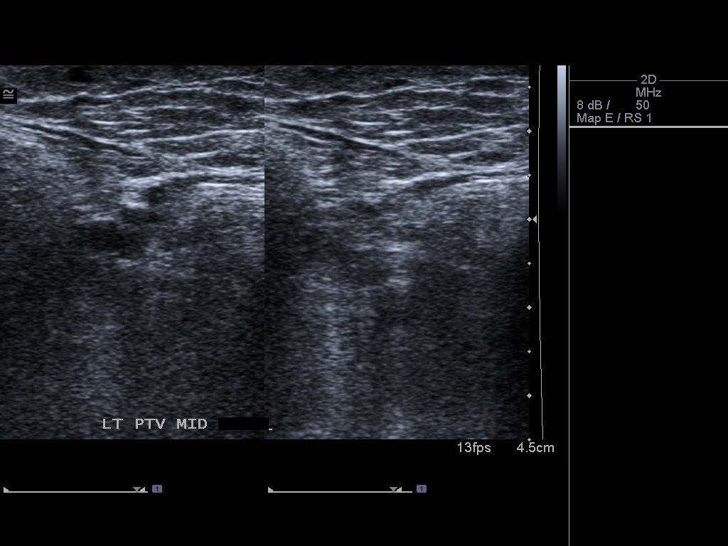
[im 31/31]
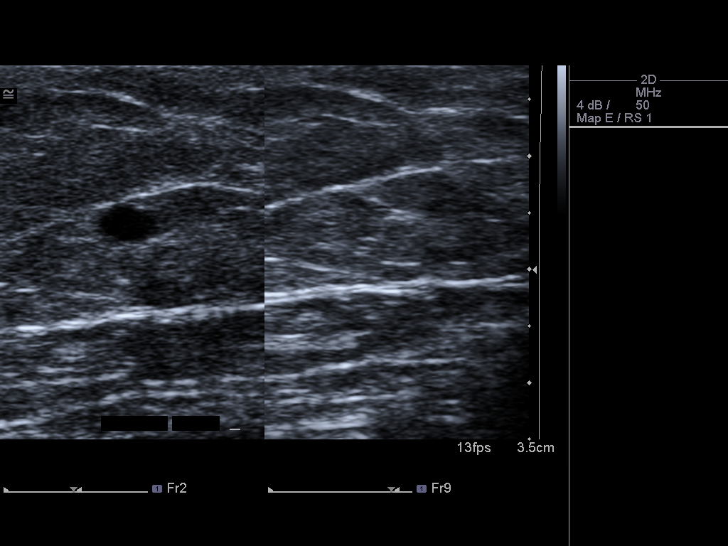

[13 of 24 positions shown; findings below may reference images not displayed]

FINDINGS: There is limited visualization of the left popliteal
vein and the calf veins bilaterally.  No recurrent or definite
residual thrombus is demonstrated in either leg. Normal
compressibility of bilateral common femoral, superficial femoral,
and visualized popliteal veins is demonstrated.  No filling defects
to suggest DVT on grayscale or color Doppler imaging.  Doppler
waveforms show normal direction of venous flow, normal respiratory
phasicity and response to augmentation.
IMPRESSION: 1.  There is limited visualization of the left popliteal vein and
the calf veins bilaterally.
2.  No definite residual or progressive DVT demonstrated.

## 2013-04-22 ENCOUNTER — Other Ambulatory Visit: Payer: Self-pay | Admitting: Family

## 2013-04-22 ENCOUNTER — Ambulatory Visit
Admission: RE | Admit: 2013-04-22 | Discharge: 2013-04-22 | Disposition: A | Discharge status: Home / Self Care | Payer: Medicare Other | Source: Ambulatory Visit | Attending: Family | Admitting: Family

## 2013-04-22 DIAGNOSIS — R922 Inconclusive mammogram: Secondary | ICD-10-CM

## 2013-04-22 DIAGNOSIS — R921 Mammographic calcification found on diagnostic imaging of breast: Secondary | ICD-10-CM

## 2013-04-25 ENCOUNTER — Ambulatory Visit: Payer: Medicare Other | Admitting: Family

## 2013-04-29 ENCOUNTER — Other Ambulatory Visit: Payer: Self-pay | Admitting: Family Medicine

## 2013-04-29 ENCOUNTER — Encounter: Payer: Self-pay | Admitting: Family Medicine

## 2013-04-29 ENCOUNTER — Ambulatory Visit (INDEPENDENT_AMBULATORY_CARE_PROVIDER_SITE_OTHER): Payer: Medicare Other | Admitting: Family Medicine

## 2013-04-29 ENCOUNTER — Ambulatory Visit (HOSPITAL_BASED_OUTPATIENT_CLINIC_OR_DEPARTMENT_OTHER)
Admission: RE | Admit: 2013-04-29 | Discharge: 2013-04-29 | Disposition: A | Discharge status: Home / Self Care | Payer: Medicare Other | Source: Ambulatory Visit | Attending: Family Medicine | Admitting: Family Medicine

## 2013-04-29 ENCOUNTER — Ambulatory Visit: Payer: Medicare Other | Admitting: Family Medicine

## 2013-04-29 VITALS — BP 140/82 | HR 85 | Temp 98.5°F | Ht 67.0 in | Wt 199.1 lb

## 2013-04-29 DIAGNOSIS — M949 Disorder of cartilage, unspecified: Secondary | ICD-10-CM | POA: Insufficient documentation

## 2013-04-29 DIAGNOSIS — M549 Dorsalgia, unspecified: Secondary | ICD-10-CM

## 2013-04-29 DIAGNOSIS — E785 Hyperlipidemia, unspecified: Secondary | ICD-10-CM

## 2013-04-29 DIAGNOSIS — M898X9 Other specified disorders of bone, unspecified site: Secondary | ICD-10-CM | POA: Insufficient documentation

## 2013-04-29 DIAGNOSIS — IMO0001 Reserved for inherently not codable concepts without codable children: Secondary | ICD-10-CM

## 2013-04-29 DIAGNOSIS — M899 Disorder of bone, unspecified: Secondary | ICD-10-CM | POA: Insufficient documentation

## 2013-04-29 DIAGNOSIS — R0789 Other chest pain: Secondary | ICD-10-CM | POA: Insufficient documentation

## 2013-04-29 DIAGNOSIS — I10 Essential (primary) hypertension: Secondary | ICD-10-CM

## 2013-04-29 MED ORDER — HYDROCODONE-ACETAMINOPHEN 7.5-325 MG PO TABS: 1.0000 | ORAL_TABLET | Freq: Four times a day (QID) | ORAL | Status: DC | PRN

## 2013-04-29 NOTE — Progress Notes (Signed)
Patient ID: [name], female   DOB: 07-07-1941, 72 y.o.   MRN: [id number] [name] [id number] 09/03/1941 04/29/2013      Progress Note-Follow Up  Subjective  Chief Complaint  Chief Complaint  Patient presents with  . Follow-up    6 week    HPI  Patient is a 72 year old Caucasian female in today for followup. She is doing relatively well except for some worsening mid back pain. She denies any falls or trauma. She denies any GI or GU complaints is having pain over her lower thoracic region is more to the right than directly over the spine but does radiate around the whole general area to the front from both sites. She says when she sits quiet she can use a result the pain but with movement he can go as high as a 10 and. No recent illness. No cough or congestion. No chest pain otherwise, palpitations, shortness or breath or other acute concerns noted at today's  Past Medical History  Diagnosis Date  . Hyperplastic colon polyp   . Hypertension   . Diabetes mellitus type II   . Osteoarthritis   . GERD (gastroesophageal reflux disease)   . Osteopenia   . Lupus     of skin see dermatologist frequently  . OA (osteoarthritis of spine)     C-spine  . DVT (deep venous thrombosis)     right leg DVT 05-17-2008  . Leg swelling     left leg 2-10, u/s showed a old clot, has a hypercoag panel,  . History of cardiovascular stress test     10/09 had CP, stress test neg, likely GI (GERD)-09/2006-had CP: stress test (-)  . 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 DVT (deep vein thrombosis) in pregnancy   . DVT of leg (deep venous thrombosis) 07/28/2011  . 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 Allergy   . Clotting disorder     hx of PE and DVT  . C. difficile diarrhea     hx of,   . 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 Skin cancer     skin CA  . Left leg pain 03/12/2013  . Otitis externa  03/19/2013    right    Past Surgical History  Procedure Laterality Date  . Cholecystectomy    . Breast biopsy      left breast and right breast  . Breast lumpectomy      left breast-ductal carcinoma in situ  . Appendectomy    . Skin cancer excision      from back     Family History  Problem Relation Age of Onset  . Diabetes Mother   . Diabetes    . Heart attack Neg Hx   . Colon cancer Neg Hx   . Breast cancer Neg Hx   . Esophageal cancer Neg Hx   . Rectal cancer Neg Hx   . Stomach cancer Neg Hx     History   Social History  . Marital Status: Widowed    Spouse Name: N/A    Number of Children: 3  . Years of Education: N/A   Occupational History  . Retired    Social History Main Topics  . Smoking status: Never Smoker   . Smokeless tobacco: Never Used  . Alcohol Use: No  . Drug Use: No  . Sexually Active:  Not Currently   Other Topics Concern  . Not on file   Social History Narrative   Single-widow    3 children , 1 in [place] (son, daughter - [place])    [place] from [place], [place]   Alcohol Use - no      tobacco-- never     Illicit Drug Use - no    Pt is Jehovah's Witness-No blood products   Daughter - [name]           Current Outpatient Prescriptions on File Prior to Visit  Medication Sig Dispense Refill  . B Complex-C (SUPER B COMPLEX PO) Take 1 tablet by mouth daily.      . Calcium Carbonate-Vitamin D (CALCIUM-VITAMIN D) 600-200 MG-UNIT CAPS Take 1 tablet by mouth 2 (two) times daily.       . Cholecalciferol (VITAMIN D3) LIQD Take 35,000 Units by mouth. 2 drops at bedtime      . enoxaparin (LOVENOX) 40 MG/0.4ML SOLN Inject 0.4 mLs (40 mg total) into the skin daily.  6.4 mL  0  . Enoxaparin Sodium (LOVENOX IJ) Inject as directed daily.      . fluconazole (DIFLUCAN) 150 MG tablet 1 tab po daily x 3 days then 1 tab po q week x 4 weeks then stop  7 tablet  1  . furosemide (LASIX) 20 MG tablet TAKE 1 TABLET (20 MG TOTAL) BY MOUTH DAILY AS  NEEDED.  30 tablet  2  . glucosamine-chondroitin 500-400 MG tablet Take 1 tablet by mouth 2 (two) times daily.       Marland Kitchen glucose blood test strip Use to check blood sugar 4 times a day.      Marland Kitchen glucose blood test strip FREESTYLE TEST STRIP  Use to test blood sugar 3-4 times daily.      . hydrochlorothiazide (MICROZIDE) 12.5 MG capsule Take 1 capsule (12.5 mg total) by mouth daily.  30 capsule  3  . insulin glargine (LANTUS) 100 UNIT/ML injection Inject 40 Units into the skin at bedtime.       . insulin regular (NOVOLIN R,HUMULIN R) 100 units/mL injection Inject 22-32 Units into the skin 3 (three) times daily before meals. Sliding scale      . lisinopril (PRINIVIL,ZESTRIL) 40 MG tablet Take 1 tablet (40 mg total) by mouth daily.  90 tablet  1  . loratadine (CLARITIN) 10 MG tablet Take 1 tablet (10 mg total) by mouth daily.  30 tablet  11  . neomycin-polymyxin-hydrocortisone (CORTISPORIN) otic solution Place 3 drops into the right ear 3 (three) times daily.  10 mL  1  . niacin 500 MG tablet Take 500 mg by mouth 2 (two) times daily.       Marland Kitchen nystatin-triamcinolone ointment (MYCOLOG) Apply topically 2 (two) times daily as needed.  60 g  2  . warfarin (COUMADIN) 5 MG tablet Take 1 1/2 tablets on Monday thru Friday, and 1 tablet on Saturday and Sunday.       No current facility-administered medications on file prior to visit.    Allergies  Allergen Reactions  . Fluconazole Other (See Comments)    REACTION: severe fatigue and muscle weakness  . Metformin Diarrhea  . Pioglitazone Other (See Comments)    REACTION: wt gain  . Vancomycin     Erythematous rash, hands and toes became cyanotic.      Review of Systems  Review of Systems  Constitutional: Negative for fever and malaise/fatigue.  HENT: Negative for  congestion.   Eyes: Negative for discharge.  Respiratory: Negative for shortness of breath.   Cardiovascular: Negative for chest pain, palpitations and leg swelling.  Gastrointestinal:  Negative for nausea, abdominal pain and diarrhea.  Genitourinary: Negative for dysuria.  Musculoskeletal: Negative for falls.  Skin: Negative for rash.  Neurological: Negative for loss of consciousness and headaches.  Endo/Heme/Allergies: Negative for polydipsia.  Psychiatric/Behavioral: Negative for depression and suicidal ideas. The patient is not nervous/anxious and does not have insomnia.     Objective  BP 140/82  Pulse 85  Temp(Src) 98.5 F (36.9 C) (Oral)  Ht 5\' 7"  (1.702 m)  Wt 199 lb 1.9 oz (90.32 kg)  BMI 31.18 kg/m2  SpO2 95%  Physical Exam  Physical Exam  Lab Results  Component Value Date   TSH 1.467 04/06/2012   Lab Results  Component Value Date   WBC 7.9 12/20/2012   HGB 12.2 12/20/2012   HCT 36.6 12/20/2012   MCV 81.2 12/20/2012   PLT 185 12/20/2012   Lab Results  Component Value Date   CREATININE 0.83 12/20/2012   BUN 12 12/20/2012   NA 141 12/20/2012   K 4.1 12/20/2012   CL 106 12/20/2012   CO2 29 12/20/2012   Lab Results  Component Value Date   ALT 30 02/27/2012   AST 25 02/27/2012   ALKPHOS 83 02/27/2012   BILITOT 0.5 02/27/2012   Lab Results  Component Value Date   CHOL 198 12/21/2009   Lab Results  Component Value Date   HDL 32 12/21/2009   Lab Results  Component Value Date   LDLCALC 125 12/21/2009   Lab Results  Component Value Date   TRIG 201* 11/06/2009   Lab Results  Component Value Date   CHOLHDL 5.4 11/06/2009     Assessment & Plan HYPERTENSION Well controlled no changes  DIABETES MELLITUS, TYPE II, UNCONTROLLED Needs repeat hgba1c at next visit. Minimize simple carbs and continue Lantus  Back pain midback pain. Xray unremarkable for fracture. Encouraged moist heat and salon pas patches, given Norco to use prn report if no improvement  HYPERLIPIDEMIA Avoid trans fats, is not using Questran routinely, encouraged krill oil

## 2013-04-29 NOTE — Patient Instructions (Addendum)
Salon Pas patches for pain If no better next week call and we will check labs If the rash gets worse take the Diflucan again   Back Pain, Adult Low back pain is very common. About 1 in 5 people have back pain.The cause of low back pain is rarely dangerous. The pain often gets better over time.About half of people with a sudden onset of back pain feel better in just 2 weeks. About 8 in 10 people feel better by 6 weeks.  CAUSES Some common causes of back pain include:  Strain of the muscles or ligaments supporting the spine.  Wear and tear (degeneration) of the spinal discs.  Arthritis.  Direct injury to the back. DIAGNOSIS Most of the time, the direct cause of low back pain is not known.However, back pain can be treated effectively even when the exact cause of the pain is unknown.Answering your caregiver's questions about your overall health and symptoms is one of the most accurate ways to make sure the cause of your pain is not dangerous. If your caregiver needs more information, he or she may order lab work or imaging tests (X-rays or MRIs).However, even if imaging tests show changes in your back, this usually does not require surgery. HOME CARE INSTRUCTIONS For many people, back pain returns.Since low back pain is rarely dangerous, it is often a condition that people can learn to [facility] their own.   Remain active. It is stressful on the back to sit or stand in one place. Do not sit, drive, or stand in one place for more than 30 minutes at a time. Take short walks on level surfaces as soon as pain allows.Try to increase the length of time you walk each day.  Do not stay in bed.Resting more than 1 or 2 days can delay your recovery.  Do not avoid exercise or work.Your body is made to move.It is not dangerous to be active, even though your back may hurt.Your back will likely heal faster if you return to being active before your pain is gone.  Pay attention to your body when  you bend and lift. Many people have less discomfortwhen lifting if they bend their knees, keep the load close to their bodies,and avoid twisting. Often, the most comfortable positions are those that put less stress on your recovering back.  Find a comfortable position to sleep. Use a firm mattress and lie on your side with your knees slightly bent. If you lie on your back, put a pillow under your knees.  Only take over-the-counter or prescription medicines as directed by your caregiver. Over-the-counter medicines to reduce pain and inflammation are often the most helpful.Your caregiver may prescribe muscle relaxant drugs.These medicines help dull your pain so you can more quickly return to your normal activities and healthy exercise.  Put ice on the injured area.  Put ice in a plastic bag.  Place a towel between your skin and the bag.  Leave the ice on for 15-20 minutes, 3-4 times a day for the first 2 to 3 days. After that, ice and heat may be alternated to reduce pain and spasms.  Ask your caregiver about trying back exercises and gentle massage. This may be of some benefit.  Avoid feeling anxious or stressed.Stress increases muscle tension and can worsen back pain.It is important to recognize when you are anxious or stressed and learn ways to manage it.Exercise is a great option. SEEK MEDICAL CARE IF:  You have pain that is not relieved with  rest or medicine.  You have pain that does not improve in 1 week.  You have new symptoms.  You are generally not feeling well. SEEK IMMEDIATE MEDICAL CARE IF:   You have pain that radiates from your back into your legs.  You develop new bowel or bladder control problems.  You have unusual weakness or numbness in your arms or legs.  You develop nausea or vomiting.  You develop abdominal pain.  You feel faint. Document Released: 11/03/2005 Document Revised: 05/04/2012 Document Reviewed: 03/24/2011 [facility] Patient Information  2014 [place], [place].

## 2013-04-30 LAB — URINALYSIS
Bilirubin Urine: NEGATIVE
Glucose, UA: 100 mg/dL — AB
Protein, ur: NEGATIVE mg/dL
Urobilinogen, UA: 0.2 mg/dL (ref 0.0–1.0)

## 2013-04-30 NOTE — Assessment & Plan Note (Signed)
midback pain. Xray unremarkable for fracture. Encouraged moist heat and salon pas patches, given Norco to use prn report if no improvement

## 2013-04-30 NOTE — Assessment & Plan Note (Signed)
Avoid trans fats, is not using Questran routinely, encouraged krill oil

## 2013-04-30 NOTE — Assessment & Plan Note (Signed)
Needs repeat hgba1c at next visit. Minimize simple carbs and continue Lantus

## 2013-04-30 NOTE — Assessment & Plan Note (Signed)
Well controlled no changes 

## 2013-05-02 ENCOUNTER — Telehealth: Payer: Self-pay | Admitting: Family

## 2013-05-02 NOTE — Telephone Encounter (Signed)
Patient called read her Dr Mariel Aloe results of her x ray.  She would like to know why she is in such pain in that area.  Says if she coughs it hurts so much she would like to cry.  Please advise

## 2013-05-02 NOTE — Telephone Encounter (Signed)
i believe it is bad muscle spasm but we can refer her to orthopaedics for further consideration if she is still in a lot of pain

## 2013-05-02 NOTE — Telephone Encounter (Signed)
I tried to call pt but mailbox is full  Please advise other question

## 2013-05-03 ENCOUNTER — Telehealth: Payer: Self-pay | Admitting: *Deleted

## 2013-05-03 ENCOUNTER — Other Ambulatory Visit: Payer: Self-pay | Admitting: Family Medicine

## 2013-05-03 DIAGNOSIS — M549 Dorsalgia, unspecified: Secondary | ICD-10-CM

## 2013-05-03 MED ORDER — CIPROFLOXACIN HCL 250 MG PO TABS: 250.0000 mg | ORAL_TABLET | Freq: Two times a day (BID) | ORAL | Status: DC

## 2013-05-03 NOTE — Telephone Encounter (Deleted)
Message copied by Regis Bill on Tue May 03, 2013  9:24 AM ------      Message from: Court Joy      Created: Mon May 02, 2013  5:20 PM       pts phone is full and you can't leave messages. I will try again in the morning ------

## 2013-05-03 NOTE — Telephone Encounter (Signed)
Message copied by Regis Bill on Tue May 03, 2013  9:04 AM ------      Message from: Court Joy      Created: Mon May 02, 2013  5:20 PM       pts phone is full and you can't leave messages. I will try again in the morning ------

## 2013-05-03 NOTE — Telephone Encounter (Signed)
Notes Recorded by Bradd Canary, MD on 04/29/2013 at 5:23 PM Notify xray negative for fracture in spine or bone  Patient informed; she would like to know if she should move her July appt forward d/t the continued pain and/or if you have any recommendations to help with the pain/SLS [Also, updated home phone, as pt informed that her "phone is working again"]

## 2013-05-03 NOTE — Telephone Encounter (Signed)
Pt would like to proceed with the ortho referral

## 2013-05-03 NOTE — Telephone Encounter (Signed)
Notes Recorded by Bradd Canary, MD on 05/02/2013 at 12:43 PM Notify positive EColi UTI and needs to start Ciprofloxacin 250 mg po bid x 7 days, add a probiotic as well if not taking  Patient informed, understood & agreed; will speak w/pharmacist about probiotic/SLS

## 2013-05-03 NOTE — Telephone Encounter (Signed)
done

## 2013-05-05 ENCOUNTER — Telehealth: Payer: Self-pay

## 2013-05-05 ENCOUNTER — Other Ambulatory Visit: Payer: Self-pay | Admitting: Family

## 2013-05-05 ENCOUNTER — Ambulatory Visit
Admission: RE | Admit: 2013-05-05 | Discharge: 2013-05-05 | Disposition: A | Discharge status: Home / Self Care | Payer: Medicare Other | Source: Ambulatory Visit | Attending: Family | Admitting: Family

## 2013-05-05 DIAGNOSIS — R921 Mammographic calcification found on diagnostic imaging of breast: Secondary | ICD-10-CM

## 2013-05-05 DIAGNOSIS — R922 Inconclusive mammogram: Secondary | ICD-10-CM

## 2013-05-05 DIAGNOSIS — R9230 Dense breasts, unspecified: Secondary | ICD-10-CM

## 2013-05-05 NOTE — Telephone Encounter (Signed)
Pt left a message stating she wasn't sure how to take her Diflucan?  I called back and left a detailed message on vm

## 2013-05-06 ENCOUNTER — Other Ambulatory Visit: Payer: Self-pay | Admitting: Family

## 2013-05-06 ENCOUNTER — Ambulatory Visit
Admission: RE | Admit: 2013-05-06 | Discharge: 2013-05-06 | Disposition: A | Discharge status: Home / Self Care | Payer: Medicare Other | Source: Ambulatory Visit | Attending: Family | Admitting: Family

## 2013-05-06 DIAGNOSIS — R9230 Dense breasts, unspecified: Secondary | ICD-10-CM

## 2013-05-06 DIAGNOSIS — C50911 Malignant neoplasm of unspecified site of right female breast: Secondary | ICD-10-CM

## 2013-05-06 DIAGNOSIS — R921 Mammographic calcification found on diagnostic imaging of breast: Secondary | ICD-10-CM

## 2013-05-06 DIAGNOSIS — R922 Inconclusive mammogram: Secondary | ICD-10-CM

## 2013-05-09 ENCOUNTER — Encounter: Payer: Self-pay | Admitting: Family

## 2013-05-09 ENCOUNTER — Telehealth: Payer: Self-pay | Admitting: Family

## 2013-05-09 ENCOUNTER — Ambulatory Visit (INDEPENDENT_AMBULATORY_CARE_PROVIDER_SITE_OTHER): Payer: Medicare Other | Admitting: Family

## 2013-05-09 VITALS — BP 130/66 | HR 72 | Temp 98.1°F | Resp 16 | Wt 202.0 lb

## 2013-05-09 DIAGNOSIS — Z853 Personal history of malignant neoplasm of breast: Secondary | ICD-10-CM

## 2013-05-09 DIAGNOSIS — L0390 Cellulitis, unspecified: Secondary | ICD-10-CM | POA: Insufficient documentation

## 2013-05-09 DIAGNOSIS — Z5181 Encounter for therapeutic drug level monitoring: Secondary | ICD-10-CM

## 2013-05-09 MED ORDER — TRAMADOL HCL 50 MG PO TABS: 50.0000 mg | ORAL_TABLET | Freq: Three times a day (TID) | ORAL | Status: DC | PRN

## 2013-05-09 MED ORDER — DOXYCYCLINE HYCLATE 100 MG PO TABS: 100.0000 mg | ORAL_TABLET | Freq: Two times a day (BID) | ORAL | Status: DC

## 2013-05-09 NOTE — Assessment & Plan Note (Signed)
Pt declined LE doppler which was ordered by cancer center.  Will plan to check PT/inr as she is already on coumadin and is currently on cipro.  Rx with doxy, and plan close follow up in 48 hours.

## 2013-05-09 NOTE — Patient Instructions (Addendum)
Please complete lab work prior to leaving. Follow up on Wednesday.  Call sooner if fever, increased pain, redness/fever.

## 2013-05-09 NOTE — Progress Notes (Signed)
Subjective:    Patient ID: [name], female    DOB: 13-Dec-1940, 72 y.o.   MRN: [id number]  HPI  Left leg pain- Saturday night she had convulsions and was found to have a sugar of 33.  Lucila Maine found her and called 911.  She reports that she had pain on the left leg.  She reports that she does not have fever.     Recently underwent biopsy of the right breast which unfortunately was + for ductal carcinoma.    Review of Systems See HPI  Past Medical History  Diagnosis Date  . Hyperplastic colon polyp   . Hypertension   . Diabetes mellitus type II   . Osteoarthritis   . GERD (gastroesophageal reflux disease)   . Osteopenia   . Lupus     of skin see dermatologist frequently  . OA (osteoarthritis of spine)     C-spine  . DVT (deep venous thrombosis)     right leg DVT 05-17-2008  . Leg swelling     left leg 2-10, u/s showed a old clot, has a hypercoag panel,  . History of cardiovascular stress test     10/09 had CP, stress test neg, likely GI (GERD)-09/2006-had CP: stress test (-)  . 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 DVT (deep vein thrombosis) in pregnancy   . DVT of leg (deep venous thrombosis) 07/28/2011  . 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 Allergy   . Clotting disorder     hx of PE and DVT  . C. difficile diarrhea     hx of,   . 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 Skin cancer     skin CA  . Left leg pain 03/12/2013  . Otitis externa 03/19/2013    right    History   Social History  . Marital Status: Widowed    Spouse Name: N/A    Number of Children: 3  . Years of Education: N/A   Occupational History  . Retired    Social History Main Topics  . Smoking status: Never Smoker   . Smokeless tobacco: Never Used  . Alcohol Use: No  . Drug Use: No  . Sexually Active: Not Currently   Other Topics Concern  . Not on file   Social History Narrative   Single-widow    3 children , 1 in [place] (son, daughter - [place])    [place] from [place], [place]   Alcohol Use - no      tobacco-- never     Illicit Drug Use - no    Pt is Jehovah's Witness-No blood products   Daughter - Niajah Sipos           Past Surgical History  Procedure Laterality Date  . Cholecystectomy    . Breast biopsy      left breast and right breast  . Breast lumpectomy      left breast-ductal carcinoma in situ  . Appendectomy    . Skin cancer excision      from back     Family History  Problem Relation Age of Onset  . Diabetes Mother   . Diabetes    . Heart attack Neg Hx   . Colon cancer Neg Hx   . Breast cancer Neg Hx   . Esophageal cancer Neg Hx   .  Rectal cancer Neg Hx   . Stomach cancer Neg Hx     Allergies  Allergen Reactions  . Fluconazole Other (See Comments)    REACTION: severe fatigue and muscle weakness  . Metformin Diarrhea  . Pioglitazone Other (See Comments)    REACTION: wt gain  . Vancomycin     Erythematous rash, hands and toes became cyanotic.      Current Outpatient Prescriptions on File Prior to Visit  Medication Sig Dispense Refill  . B Complex-C (SUPER B COMPLEX PO) Take 1 tablet by mouth daily.      . Calcium Carbonate-Vitamin D (CALCIUM-VITAMIN D) 600-200 MG-UNIT CAPS Take 1 tablet by mouth 2 (two) times daily.       . Cholecalciferol (VITAMIN D3) LIQD Take 35,000 Units by mouth. 2 drops at bedtime      . ciprofloxacin (CIPRO) 250 MG tablet Take 1 tablet (250 mg total) by mouth 2 (two) times daily.  14 tablet  0  . Enoxaparin Sodium (LOVENOX IJ) Inject as directed daily.      . fluconazole (DIFLUCAN) 150 MG tablet 1 tab po daily x 3 days then 1 tab po q week x 4 weeks then stop  7 tablet  1  . furosemide (LASIX) 20 MG tablet TAKE 1 TABLET (20 MG TOTAL) BY MOUTH DAILY AS NEEDED.  30 tablet  2  . glucosamine-chondroitin 500-400 MG tablet Take 1 tablet by mouth 2 (two) times daily.       Marland Kitchen glucose blood test strip Use to check blood  sugar 4 times a day.      Marland Kitchen glucose blood test strip FREESTYLE TEST STRIP  Use to test blood sugar 3-4 times daily.      . hydrochlorothiazide (MICROZIDE) 12.5 MG capsule Take 1 capsule (12.5 mg total) by mouth daily.  30 capsule  3  . HYDROcodone-acetaminophen (NORCO) 7.5-325 MG per tablet Take 1 tablet by mouth every 6 (six) hours as needed for pain.  30 tablet  1  . insulin glargine (LANTUS) 100 UNIT/ML injection Inject 40 Units into the skin at bedtime.       . insulin regular (NOVOLIN R,HUMULIN R) 100 units/mL injection Inject 22-32 Units into the skin 3 (three) times daily before meals. Sliding scale      . lisinopril (PRINIVIL,ZESTRIL) 40 MG tablet Take 1 tablet (40 mg total) by mouth daily.  90 tablet  1  . loratadine (CLARITIN) 10 MG tablet Take 1 tablet (10 mg total) by mouth daily.  30 tablet  11  . neomycin-polymyxin-hydrocortisone (CORTISPORIN) otic solution Place 3 drops into the right ear 3 (three) times daily.  10 mL  1  . niacin 500 MG tablet Take 500 mg by mouth 2 (two) times daily.       Marland Kitchen nystatin-triamcinolone ointment (MYCOLOG) Apply topically 2 (two) times daily as needed.  60 g  2  . warfarin (COUMADIN) 5 MG tablet Take 1 1/2 tablets on Monday thru Friday, and 1 tablet on Saturday and Sunday.      . enoxaparin (LOVENOX) 40 MG/0.4ML SOLN Inject 0.4 mLs (40 mg total) into the skin daily.  6.4 mL  0   No current facility-administered medications on file prior to visit.    BP 130/66  Pulse 72  Temp(Src) 98.1 F (36.7 C) (Oral)  Resp 16  Wt 202 lb (91.627 kg)  BMI 31.63 kg/m2  SpO2 98%       Objective:   Physical Exam  Constitutional: She appears well-developed  and well-nourished. No distress.  Cardiovascular: Normal rate and regular rhythm.   No murmur heard. Pulmonary/Chest: Effort normal and breath sounds normal. No respiratory distress. She has no wheezes. She has no rales. She exhibits no tenderness.  Musculoskeletal:  Left leg swelling noted beneath knee  to ankle. Some open areas on shin are weeping serosanguinous drainage.           Assessment & Plan:

## 2013-05-09 NOTE — Telephone Encounter (Signed)
Spoke to pt. Advised her not to take coumadin tonight and not to take cipro. Ok to take doxy, hold coumadin until follow up on Wednesday. She verbalizes understanding.

## 2013-05-09 NOTE — Progress Notes (Signed)
Blood drawn for labs for MR tomorrow morning; from right [facility] space; site "u."

## 2013-05-09 NOTE — Assessment & Plan Note (Signed)
Now with + path in the contralateral breast- (right) ductal carcinoma. Work up is ongoing.

## 2013-05-10 ENCOUNTER — Encounter (INDEPENDENT_AMBULATORY_CARE_PROVIDER_SITE_OTHER): Payer: Self-pay | Admitting: General Surgery

## 2013-05-10 ENCOUNTER — Ambulatory Visit (INDEPENDENT_AMBULATORY_CARE_PROVIDER_SITE_OTHER): Payer: Medicare Other | Admitting: General Surgery

## 2013-05-10 ENCOUNTER — Telehealth: Payer: Self-pay | Admitting: Family

## 2013-05-10 ENCOUNTER — Telehealth: Payer: Self-pay

## 2013-05-10 ENCOUNTER — Ambulatory Visit: Admission: RE | Admit: 2013-05-10 | Payer: Medicare Other | Source: Ambulatory Visit

## 2013-05-10 ENCOUNTER — Ambulatory Visit
Admission: RE | Admit: 2013-05-10 | Discharge: 2013-05-10 | Disposition: A | Discharge status: Home / Self Care | Payer: Medicare Other | Source: Ambulatory Visit | Attending: Family | Admitting: Family

## 2013-05-10 VITALS — BP 156/72 | HR 78 | Temp 98.0°F | Resp 14 | Ht 66.0 in | Wt 201.8 lb

## 2013-05-10 DIAGNOSIS — C50911 Malignant neoplasm of unspecified site of right female breast: Secondary | ICD-10-CM

## 2013-05-10 DIAGNOSIS — C50919 Malignant neoplasm of unspecified site of unspecified female breast: Secondary | ICD-10-CM

## 2013-05-10 MED ORDER — GADOBENATE DIMEGLUMINE 529 MG/ML IV SOLN
19.0000 mL | Freq: Once | INTRAVENOUS | Status: AC | PRN
  Administered 2013-05-10: 19 mL via INTRAVENOUS

## 2013-05-10 NOTE — Telephone Encounter (Signed)
Please call [facility]   ,Suzette Battiest

## 2013-05-10 NOTE — Telephone Encounter (Signed)
Reviewed INR with kevin- pharmacist at coumadin clinic.  He agrees with holding coumadin, discontinuation of cipro and repeat PT/INR in 2 days.

## 2013-05-10 NOTE — Patient Instructions (Signed)
Will need mri and 2nd biopsy

## 2013-05-10 NOTE — Progress Notes (Signed)
Subjective:     Patient ID: [name], female   DOB: 1941-09-17, 72 y.o.   MRN: [id number]  HPI We are asked to see the patient in consultation by Dr. Imogene Burn to evaluate her for right-sided breast cancer. The patient is a 72 year old white female who recently went for a routine screening mammogram. At that time she was found to have 2 abnormalities in the right breast. One of these was biopsied and came back as invasive breast cancer. The second has not been biopsied yet. Prior to the biopsies she denied any breast pain or discharge from her nipple. She does have a history of left-sided breast cancer that was treated with breast conservation and radiation therapy. She also has a history of DVT and PE and is on lifelong Coumadin. Her INR was checked yesterday and came back at 5.2. She is scheduled for a second biopsy this afternoon. She is also a Jehovah's Witness and does not want any blood products.  Review of Systems  Constitutional: Negative.   HENT: Negative.   Eyes: Negative.   Respiratory: Negative.   Cardiovascular: Negative.   Gastrointestinal: Negative.   Endocrine: Negative.   Genitourinary: Negative.   Musculoskeletal: Negative.   Skin: Negative.   Allergic/Immunologic: Negative.   Neurological: Negative.   Hematological: Bruises/bleeds easily.  Psychiatric/Behavioral: Negative.        Objective:   Physical Exam  Constitutional: She is oriented to person, place, and time. She appears well-developed and well-nourished.  HENT:  Head: Normocephalic and atraumatic.  Eyes: Conjunctivae and EOM are normal. Pupils are equal, round, and reactive to light.  Neck: Normal range of motion. Neck supple.  Cardiovascular: Normal rate, regular rhythm and normal heart sounds.   Pulmonary/Chest: Effort normal and breath sounds normal.  There is a large hematoma of the medial right breast. Other than this there is no palpable mass in either breast. There is no palpable axillary,  supraclavicular, or cervical lymphadenopathy  Abdominal: Soft. Bowel sounds are normal. She exhibits no mass. There is no tenderness.  Musculoskeletal: Normal range of motion.  Lymphadenopathy:    She has no cervical adenopathy.  Neurological: She is alert and oriented to person, place, and time.  Skin: Skin is warm and dry.  Psychiatric: She has a normal mood and affect. Her behavior is normal.       Assessment:     The patient appears to have at least one cancer in the right breast measuring around a centimeter in diameter. The second area has yet to be biopsied. She completed an MRI study this morning but this has not been read. I've discussed with her in detail the different options for surgical treatment of her cancer. If she has just one small area and certainly she would be a candidate for breast conservation or mastectomy. If she has multiple areas of cancer in the right breast that I think she might be best served with a mastectomy. She would also be a candidate for sentinel node mapping. Unfortunately because of her blood thinner she will probably need to have her second biopsy rescheduled for next week.    Plan:     At this point our plan to see her back shortly after her second biopsy to go over the results of this study and to talk to her about the options for treatment again and to see how she would like to proceed.

## 2013-05-10 NOTE — Telephone Encounter (Signed)
Triage Record Num: [id number] Operator: Albertine Grates Patient Name: [name] Call Date & Time: 05/09/2013 7:06:40PM Patient Phone: [phone] PCP: Sandford Craze, NP Patient Gender: Female PCP Fax : [phone] Patient DOB: [birth year] Practice Name: [facility] Reason for Call: Caller: Jodene Nam; PCP: [name], Melissa (Adults only); CB#: [phone]; [facility] calling and states patient had PT/INR drawn 6-23 and PT is 44.0 and INR 5.08. Patient notified. Denies bleeding. Advised to hold Coumadin 6-23. Advised follow up with PCP 6-24. Protocol(s) Used: PCP

## 2013-05-11 ENCOUNTER — Ambulatory Visit (INDEPENDENT_AMBULATORY_CARE_PROVIDER_SITE_OTHER): Payer: Medicare Other | Admitting: Family

## 2013-05-11 ENCOUNTER — Telehealth: Payer: Self-pay | Admitting: *Deleted

## 2013-05-11 ENCOUNTER — Encounter: Payer: Self-pay | Admitting: Family

## 2013-05-11 VITALS — BP 130/70 | HR 77 | Temp 98.4°F | Resp 16 | Ht 67.0 in | Wt 202.0 lb

## 2013-05-11 DIAGNOSIS — Z7901 Long term (current) use of anticoagulants: Secondary | ICD-10-CM

## 2013-05-11 DIAGNOSIS — L0291 Cutaneous abscess, unspecified: Secondary | ICD-10-CM

## 2013-05-11 DIAGNOSIS — C50919 Malignant neoplasm of unspecified site of unspecified female breast: Secondary | ICD-10-CM

## 2013-05-11 DIAGNOSIS — Z5181 Encounter for therapeutic drug level monitoring: Secondary | ICD-10-CM

## 2013-05-11 DIAGNOSIS — L0390 Cellulitis, unspecified: Secondary | ICD-10-CM

## 2013-05-11 LAB — PROTIME-INR
INR: 2.93 — ABNORMAL HIGH (ref <1.50)
Prothrombin Time: 29.3 seconds — ABNORMAL HIGH (ref 11.6–15.2)

## 2013-05-11 NOTE — Assessment & Plan Note (Signed)
Continue to hold coumadin, check PT/INR.

## 2013-05-11 NOTE — Progress Notes (Signed)
Subjective:    Patient ID: [name], female    DOB: 1941-08-23, 72 y.o.   MRN: [id number]  HPI  [name] is a 72 yr old female who presents today for follow up of her LLE cellulitis and supratherapeutic INR.    Cellulitis- she stopped cipro at our instruction and started doxycycline.    supratherapeutic INR- She is holding coumadin.  We did discuss in more detail today her hypoglycemic episode the other night. She admits to taking her short acting insulin at 8 PM which was 3 hrs after she ate dinner.    Review of Systems    see HPI  Past Medical History  Diagnosis Date  . Hyperplastic colon polyp   . Hypertension   . Diabetes mellitus type II   . Osteoarthritis   . GERD (gastroesophageal reflux disease)   . Osteopenia   . Lupus     of skin see dermatologist frequently  . OA (osteoarthritis of spine)     C-spine  . DVT (deep venous thrombosis)     right leg DVT 05-17-2008  . Leg swelling     left leg 2-10, u/s showed a old clot, has a hypercoag panel,  . History of cardiovascular stress test     10/09 had CP, stress test neg, likely GI (GERD)-09/2006-had CP: stress test (-)  . 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 DVT (deep vein thrombosis) in pregnancy   . DVT of leg (deep venous thrombosis) 07/28/2011  . 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 Allergy   . Clotting disorder     hx of PE and DVT  . C. difficile diarrhea     hx of,   . 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 Skin cancer     skin CA  . Left leg pain 03/12/2013  . Otitis externa 03/19/2013    right    History   Social History  . Marital Status: Widowed    Spouse Name: N/A    Number of Children: 3  . Years of Education: N/A   Occupational History  . Retired    Social History Main Topics  . Smoking status: Never Smoker   . Smokeless tobacco: Never Used  . Alcohol Use: No  . Drug Use: No  . Sexually  Active: Not Currently   Other Topics Concern  . Not on file   Social History Narrative   Single-widow    3 children , 1 in [place] (son, daughter - [place])    [place] from [place], [place]   Alcohol Use - no      tobacco-- never     Illicit Drug Use - no    Pt is Jehovah's Witness-No blood products   Daughter - [name]           Past Surgical History  Procedure Laterality Date  . Cholecystectomy    . Breast biopsy      left breast and right breast  . Breast lumpectomy      left breast-ductal carcinoma in situ  . Appendectomy    . Skin cancer excision      from back     Family History  Problem Relation Age of Onset  . Diabetes Mother   . Diabetes    . Heart attack Neg Hx   . Colon cancer Neg  Hx   . Breast cancer Neg Hx   . Esophageal cancer Neg Hx   . Rectal cancer Neg Hx   . Stomach cancer Neg Hx     Allergies  Allergen Reactions  . Fluconazole Other (See Comments)    REACTION: severe fatigue and muscle weakness  . Metformin Diarrhea  . Pioglitazone Other (See Comments)    REACTION: wt gain  . Vancomycin     Erythematous rash, hands and toes became cyanotic.      Current Outpatient Prescriptions on File Prior to Visit  Medication Sig Dispense Refill  . B Complex-C (SUPER B COMPLEX PO) Take 1 tablet by mouth daily.      . Calcium Carbonate-Vitamin D (CALCIUM-VITAMIN D) 600-200 MG-UNIT CAPS Take 1 tablet by mouth 2 (two) times daily.       . Cholecalciferol (VITAMIN D3) LIQD Take 35,000 Units by mouth. 2 drops at bedtime      . ciprofloxacin (CIPRO) 250 MG tablet Take 1 tablet (250 mg total) by mouth 2 (two) times daily.  14 tablet  0  . doxycycline (VIBRA-TABS) 100 MG tablet Take 1 tablet (100 mg total) by mouth 2 (two) times daily.  20 tablet  0  . Enoxaparin Sodium (LOVENOX IJ) Inject as directed daily.      Marland Kitchen glucosamine-chondroitin 500-400 MG tablet Take 1 tablet by mouth 2 (two) times daily.       Marland Kitchen glucose blood test strip Use to check  blood sugar 4 times a day.      Marland Kitchen glucose blood test strip FREESTYLE TEST STRIP  Use to test blood sugar 3-4 times daily.      Marland Kitchen HYDROcodone-acetaminophen (NORCO) 7.5-325 MG per tablet Take 1 tablet by mouth every 6 (six) hours as needed for pain.  30 tablet  1  . insulin glargine (LANTUS) 100 UNIT/ML injection Inject 40 Units into the skin at bedtime.       . insulin regular (NOVOLIN R,HUMULIN R) 100 units/mL injection Inject 22-32 Units into the skin 3 (three) times daily before meals. Sliding scale      . lisinopril (PRINIVIL,ZESTRIL) 40 MG tablet Take 1 tablet (40 mg total) by mouth daily.  90 tablet  1  . neomycin-polymyxin-hydrocortisone (CORTISPORIN) otic solution Place 3 drops into the right ear 3 (three) times daily.  10 mL  1  . niacin 500 MG tablet Take 500 mg by mouth 2 (two) times daily.       Marland Kitchen nystatin-triamcinolone ointment (MYCOLOG) Apply topically 2 (two) times daily as needed.  60 g  2  . traMADol (ULTRAM) 50 MG tablet Take 1 tablet (50 mg total) by mouth every 8 (eight) hours as needed for pain.  20 tablet  0  . warfarin (COUMADIN) 5 MG tablet Take 1 1/2 tablets on Monday thru Friday, and 1 tablet on Saturday and Sunday.      . furosemide (LASIX) 20 MG tablet TAKE 1 TABLET (20 MG TOTAL) BY MOUTH DAILY AS NEEDED.  30 tablet  2  . hydrochlorothiazide (MICROZIDE) 12.5 MG capsule Take 1 capsule (12.5 mg total) by mouth daily.  30 capsule  3   No current facility-administered medications on file prior to visit.    BP 130/70  Pulse 77  Temp(Src) 98.4 F (36.9 C) (Oral)  Resp 16  Ht 5\' 7"  (1.702 m)  Wt 202 lb (91.627 kg)  BMI 31.63 kg/m2  SpO2 97%    Objective:   Physical Exam  Constitutional: She appears well-developed  and well-nourished. No distress.  Musculoskeletal:  + swelling of the left lower extremity- unchange.  Erythema is about the same.  Some small petechia noted LLE.  Shin wound continues to drain a small amount of serosanguinous drainage.            Assessment & Plan:

## 2013-05-11 NOTE — Assessment & Plan Note (Signed)
Cellulitis is certainly no worse today, and arguable slightly better appearing today.  Plan continue doxycycline.  Pt is instructed to call if increased swelling, redness, pain, or fever.

## 2013-05-11 NOTE — Patient Instructions (Addendum)
Please complete your lab work prior to leaving. Continue doxycycline. Follow up in 1 week, call sooner if symptoms worsen or if leg does not continue to improve.

## 2013-05-11 NOTE — Telephone Encounter (Signed)
Notified pt and she voices understanding, will call Caryn Bee tomorrow.

## 2013-05-11 NOTE — Telephone Encounter (Signed)
Also, could you please contact kevin at the coumadin clinic at [facility] to let him know pt's INR today and plans as below. The pt wishes to continue to follow at [facility].

## 2013-05-11 NOTE — Assessment & Plan Note (Signed)
We discussed importance of taking humulin R right before meal.  Her late dosing was likely cause for her hypoglycemia. Advised pt to keep upcoming apt with Dr. Allena Katz.

## 2013-05-11 NOTE — Telephone Encounter (Signed)
Received call from [name] at [facility]. PT/INR result from today is available in EPIC.  Please advise.

## 2013-05-11 NOTE — Telephone Encounter (Signed)
No coumadin tonight. Resume coumadin tomorrow at previous dosing as listed.  Needs PT/INR in 1 week with coumadin clinic.

## 2013-05-11 NOTE — Assessment & Plan Note (Signed)
Biopsy is on hold due to supratherapeutic inr.

## 2013-05-12 NOTE — Telephone Encounter (Signed)
Notified [name] at [facility] ph) [phone]. He requests most recent result be faxed to him at [phone]. Result faxed.

## 2013-05-16 ENCOUNTER — Encounter: Payer: Self-pay | Admitting: Family

## 2013-05-16 ENCOUNTER — Telehealth: Payer: Self-pay | Admitting: Family

## 2013-05-16 ENCOUNTER — Ambulatory Visit (INDEPENDENT_AMBULATORY_CARE_PROVIDER_SITE_OTHER): Payer: Medicare Other | Admitting: Family

## 2013-05-16 VITALS — BP 140/70 | HR 70 | Temp 97.9°F | Resp 16 | Ht 67.0 in | Wt 199.0 lb

## 2013-05-16 DIAGNOSIS — L0390 Cellulitis, unspecified: Secondary | ICD-10-CM

## 2013-05-16 DIAGNOSIS — C50919 Malignant neoplasm of unspecified site of unspecified female breast: Secondary | ICD-10-CM

## 2013-05-16 DIAGNOSIS — Z5181 Encounter for therapeutic drug level monitoring: Secondary | ICD-10-CM

## 2013-05-16 MED ORDER — INSULIN REGULAR HUMAN 100 UNIT/ML IJ SOLN: 22.0000 [IU] | Freq: Three times a day (TID) | INTRAMUSCULAR | Status: DC

## 2013-05-16 MED ORDER — INSULIN GLARGINE 100 UNIT/ML ~~LOC~~ SOLN: 40.0000 [IU] | Freq: Every day | SUBCUTANEOUS | Status: DC

## 2013-05-16 MED ORDER — INSULIN LISPRO 100 UNIT/ML ~~LOC~~ SOLN: 22.0000 [IU] | Freq: Three times a day (TID) | SUBCUTANEOUS | Status: DC

## 2013-05-16 NOTE — Patient Instructions (Addendum)
Complete your lab work prior to leaving.  Hold coumadin pending your upcoming breast biopsy.   We will contact your coumadin clinic to help you with lovenox. Complete the antibiotics. Call if redness of your leg returns. Follow up in 1 month.

## 2013-05-16 NOTE — Assessment & Plan Note (Signed)
Resolving, complete doxycycline.

## 2013-05-16 NOTE — Assessment & Plan Note (Signed)
Discussed anticoagulation with Radiologist at the [facility]. See phone note.  Pt experienced large hematoma following last biopsy.  Will  Plan to hold coumadin and bridge with lovenox. Also spoke with Caryn Bee PharmD at the [facility] coumadin clinic who will see pt tomorrow and arrange lovenox bridge.

## 2013-05-16 NOTE — Progress Notes (Signed)
Subjective:    Patient ID: [name], female    DOB: 26-Jun-1941, 72 y.o.   MRN: [id number]  HPI  [name] is a 72 yr old female who presents today for follow up of her cellulitis.  She continues doxcycline and reports that the left leg seems to be improving.   She is scheduled for a breast biopsy on Thursday. She has not been instructed to hold coumadin by the breast center and would like to know what to do.    Review of Systems See HPI  Past Medical History  Diagnosis Date  . Hyperplastic colon polyp   . Hypertension   . Diabetes mellitus type II   . Osteoarthritis   . GERD (gastroesophageal reflux disease)   . Osteopenia   . Lupus     of skin see dermatologist frequently  . OA (osteoarthritis of spine)     C-spine  . DVT (deep venous thrombosis)     right leg DVT 05-17-2008  . Leg swelling     left leg 2-10, u/s showed a old clot, has a hypercoag panel,  . History of cardiovascular stress test     10/09 had CP, stress test neg, likely GI (GERD)-09/2006-had CP: stress test (-)  . 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 DVT (deep vein thrombosis) in pregnancy   . DVT of leg (deep venous thrombosis) 07/28/2011  . 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 Allergy   . Clotting disorder     hx of PE and DVT  . C. difficile diarrhea     hx of,   . 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 Skin cancer     skin CA  . Left leg pain 03/12/2013  . Otitis externa 03/19/2013    right    History   Social History  . Marital Status: Widowed    Spouse Name: N/A    Number of Children: 3  . Years of Education: N/A   Occupational History  . Retired    Social History Main Topics  . Smoking status: Never Smoker   . Smokeless tobacco: Never Used  . Alcohol Use: No  . Drug Use: No  . Sexually Active: Not Currently   Other Topics Concern  . Not on file   Social History Narrative   Single-widow    3 children , 1 in [place] (son, daughter - [place])    [place] from [place], [place]   Alcohol Use - no      tobacco-- never     Illicit Drug Use - no    Pt is Jehovah's Witness-No blood products   Daughter - [name]           Past Surgical History  Procedure Laterality Date  . Cholecystectomy    . Breast biopsy      left breast and right breast  . Breast lumpectomy      left breast-ductal carcinoma in situ  . Appendectomy    . Skin cancer excision      from back     Family History  Problem Relation Age of Onset  . Diabetes Mother   . Diabetes    . Heart attack Neg Hx   . Colon cancer Neg Hx   . Breast cancer Neg Hx   . Esophageal cancer Neg Hx   .  Rectal cancer Neg Hx   . Stomach cancer Neg Hx     Allergies  Allergen Reactions  . Fluconazole Other (See Comments)    REACTION: severe fatigue and muscle weakness  . Metformin Diarrhea  . Pioglitazone Other (See Comments)    REACTION: wt gain  . Vancomycin     Erythematous rash, hands and toes became cyanotic.      Current Outpatient Prescriptions on File Prior to Visit  Medication Sig Dispense Refill  . B Complex-C (SUPER B COMPLEX PO) Take 1 tablet by mouth daily.      . Calcium Carbonate-Vitamin D (CALCIUM-VITAMIN D) 600-200 MG-UNIT CAPS Take 1 tablet by mouth 2 (two) times daily.       . Cholecalciferol (VITAMIN D3) LIQD Take 35,000 Units by mouth. 2 drops at bedtime      . doxycycline (VIBRA-TABS) 100 MG tablet Take 1 tablet (100 mg total) by mouth 2 (two) times daily.  20 tablet  0  . Enoxaparin Sodium (LOVENOX IJ) Inject as directed daily.      Marland Kitchen glucosamine-chondroitin 500-400 MG tablet Take 1 tablet by mouth 2 (two) times daily.       Marland Kitchen glucose blood test strip Use to check blood sugar 4 times a day.      Marland Kitchen glucose blood test strip FREESTYLE TEST STRIP  Use to test blood sugar 3-4 times daily.      . hydrochlorothiazide (MICROZIDE) 12.5 MG capsule Take 1 capsule (12.5 mg  total) by mouth daily.  30 capsule  3  . lisinopril (PRINIVIL,ZESTRIL) 40 MG tablet Take 1 tablet (40 mg total) by mouth daily.  90 tablet  1  . niacin 500 MG tablet Take 500 mg by mouth 2 (two) times daily.       Marland Kitchen nystatin-triamcinolone ointment (MYCOLOG) Apply topically 2 (two) times daily as needed.  60 g  2  . traMADol (ULTRAM) 50 MG tablet Take 1 tablet (50 mg total) by mouth every 8 (eight) hours as needed for pain.  20 tablet  0  . warfarin (COUMADIN) 5 MG tablet Take 1 1/2 tablets on Monday thru Friday, and 1 tablet on Saturday and Sunday.      . ciprofloxacin (CIPRO) 250 MG tablet Take 1 tablet (250 mg total) by mouth 2 (two) times daily.  14 tablet  0  . furosemide (LASIX) 20 MG tablet TAKE 1 TABLET (20 MG TOTAL) BY MOUTH DAILY AS NEEDED.  30 tablet  2  . HYDROcodone-acetaminophen (NORCO) 7.5-325 MG per tablet Take 1 tablet by mouth every 6 (six) hours as needed for pain.  30 tablet  1   No current facility-administered medications on file prior to visit.    BP 140/70  Pulse 70  Temp(Src) 97.9 F (36.6 C) (Oral)  Resp 16  Ht 5\' 7"  (1.702 m)  Wt 199 lb (90.266 kg)  BMI 31.16 kg/m2  SpO2 99%       Objective:   Physical Exam  Constitutional: She is oriented to person, place, and time. She appears well-developed and well-nourished. No distress.  Cardiovascular: Normal rate and regular rhythm.   No murmur heard. Pulmonary/Chest: Effort normal and breath sounds normal. No respiratory distress. She has no wheezes. She has no rales. She exhibits no tenderness.  Neurological: She is alert and oriented to person, place, and time.  Skin:  Skin left shin is noted to be less erythematous.  She is noted to have bilateral LE edema 2+  Psychiatric: She has a normal  mood and affect. Her behavior is normal. Thought content normal.          Assessment & Plan:

## 2013-05-16 NOTE — Telephone Encounter (Signed)
Called the Breast Center to discuss anticoagulation plans regarding upcoming breast biopsy scheduled this Thursday.  Dr. Manson Passey is on vacation, Dr. Renato Gails covering.

## 2013-05-17 ENCOUNTER — Encounter (INDEPENDENT_AMBULATORY_CARE_PROVIDER_SITE_OTHER): Payer: Medicare Other | Admitting: General Surgery

## 2013-05-17 NOTE — Telephone Encounter (Signed)
Addendum to note below:  Spoke with Dr. Renato Gails. He recommended holding coumadin and bridging with lovenox if clinically indicated.  I believe bridge is clinically indicated.  Spoke with Caryn Bee at [facility] coumadin clinic and he will arrange bridge.

## 2013-05-18 ENCOUNTER — Telehealth: Payer: Self-pay | Admitting: Family

## 2013-05-18 NOTE — Telephone Encounter (Signed)
Yes, she should continue her antibiotic.

## 2013-05-18 NOTE — Telephone Encounter (Signed)
Pt stopped by the office, advised her to continue antibiotic.

## 2013-05-18 NOTE — Telephone Encounter (Signed)
Patient states that she is supposed to have her 2nd biopsy tomorrow and would like to know if she should take her antibiotic?

## 2013-05-18 NOTE — Telephone Encounter (Signed)
Please advise 

## 2013-05-19 ENCOUNTER — Other Ambulatory Visit (HOSPITAL_COMMUNITY): Payer: Self-pay | Admitting: Diagnostic Radiology

## 2013-05-19 ENCOUNTER — Ambulatory Visit
Admission: RE | Admit: 2013-05-19 | Discharge: 2013-05-19 | Disposition: A | Discharge status: Home / Self Care | Payer: Medicare Other | Source: Ambulatory Visit | Attending: Family | Admitting: Family

## 2013-05-19 ENCOUNTER — Telehealth: Payer: Self-pay | Admitting: *Deleted

## 2013-05-19 DIAGNOSIS — C50911 Malignant neoplasm of unspecified site of right female breast: Secondary | ICD-10-CM

## 2013-05-19 NOTE — Telephone Encounter (Signed)
Received call from Dr Deboraha Sprang at the [facility]. She is requesting pt's PT/INR form 05/17/13. Called [facility]in [facility]; INR 1.5,  PT 18.3. Called result to Dr Deboraha Sprang.

## 2013-05-24 ENCOUNTER — Encounter (INDEPENDENT_AMBULATORY_CARE_PROVIDER_SITE_OTHER): Payer: Self-pay | Admitting: General Surgery

## 2013-05-24 ENCOUNTER — Ambulatory Visit (INDEPENDENT_AMBULATORY_CARE_PROVIDER_SITE_OTHER): Payer: Medicare Other | Admitting: General Surgery

## 2013-05-24 VITALS — BP 140/80 | HR 70 | Temp 98.2°F | Resp 14 | Ht 68.0 in | Wt 202.8 lb

## 2013-05-24 DIAGNOSIS — C50919 Malignant neoplasm of unspecified site of unspecified female breast: Secondary | ICD-10-CM

## 2013-05-24 DIAGNOSIS — C50911 Malignant neoplasm of unspecified site of right female breast: Secondary | ICD-10-CM

## 2013-05-24 NOTE — Progress Notes (Signed)
Subjective:     Patient ID: [name], female   DOB: 16-Mar-1941, 72 y.o.   MRN: [id number]  HPI The patient is a 72 year old white female who was recently diagnosed with a 1 cm cancer in the central right breast. Since her last visit she had a second area biopsied and came back benign. She did have significant bruising with both biopsies secondary to being on Coumadin for DVT and PE. Clinically her nodes are negative. At this point she returns to discuss treatment options.  Review of Systems  Constitutional: Negative.   HENT: Negative.   Eyes: Negative.   Respiratory: Negative.   Cardiovascular: Negative.   Gastrointestinal: Negative.   Endocrine: Negative.   Genitourinary: Negative.   Musculoskeletal: Negative.   Skin: Negative.   Allergic/Immunologic: Negative.   Neurological: Negative.   Hematological: Bruises/bleeds easily.  Psychiatric/Behavioral: Negative.        Objective:   Physical Exam  Constitutional: She is oriented to person, place, and time. She appears well-developed and well-nourished.  HENT:  Head: Normocephalic and atraumatic.  Eyes: Conjunctivae and EOM are normal. Pupils are equal, round, and reactive to light.  Neck: Normal range of motion. Neck supple.  Cardiovascular: Normal rate, regular rhythm and normal heart sounds.   Pulmonary/Chest: Effort normal and breath sounds normal.  There is a large hematoma central and inferior in the right breast. Otherwise there is no other palpable mass in either breast. There is no palpable axillary, cervical, or supraclavicular lymphadenopathy  Abdominal: Soft. Bowel sounds are normal. She exhibits no mass. There is no tenderness.  Musculoskeletal: Normal range of motion.  Lymphadenopathy:    She has no cervical adenopathy.  Neurological: She is alert and oriented to person, place, and time.  Skin: Skin is warm and dry.  Psychiatric: She has a normal mood and affect. Her behavior is normal.       Assessment:      The patient has what appears to be a small early stage cancer in the central right breast. I've discussed with her in detail the different options for treatment and she favors breast conservation. I think this is a reasonable choice. She is also a good candidate for sentinel node mapping. I have discussed with her in detail the risks and benefits of the operation as well as some of the technical aspects and she understands and wishes to proceed     Plan:     Plan for right breast wire localized lumpectomy and sentinel node mapping. She will need to stop her Coumadin 5 days before surgery.

## 2013-05-24 NOTE — Patient Instructions (Signed)
Plan for right wire localized lumpectomy and sentinel node biopsy

## 2013-05-25 ENCOUNTER — Telehealth: Payer: Self-pay | Admitting: *Deleted

## 2013-05-25 NOTE — Telephone Encounter (Signed)
Received call from Caryn Bee at [facility] stating pt is scheduled for lumpectomy on 06/17/13 with Dr Carolynne Edouard. [place] wants to know if we want pt bridged during this period. Coumadin Clinic recommends holding Coumadin for 5 days prior to surgery and receive Lovenox 1.5mg /kg every 24 hours 4 days prior to surgery and 5-7 days post surgery until pt becomes therapeutic again. Reports this is how pt was bridged for previous surgery.  Please advise.

## 2013-05-26 NOTE — Telephone Encounter (Signed)
Yes

## 2013-05-27 ENCOUNTER — Telehealth (INDEPENDENT_AMBULATORY_CARE_PROVIDER_SITE_OTHER): Payer: Self-pay | Admitting: General Surgery

## 2013-05-27 NOTE — Telephone Encounter (Signed)
Notified Molly at the Coumadin Clinic.

## 2013-05-27 NOTE — Telephone Encounter (Signed)
Received call from Caryn Bee, pharmacist, calling to ask about Lovenox bridge for this pt to stop her Coumadin for surgery.  He wanted to know what INR target Dr. Carolynne Edouard wanted for this pt having a lumpectomy & SN mapping.  Please call [phone] and ask for him by name.

## 2013-05-27 NOTE — Telephone Encounter (Signed)
After discussion with Dr. Carolynne Edouard, called pharmacist back to clarify need for Lovenox bridge.  The request came from [facility] [place], secondary to history of DVTs.  Will proceed with Lovenox plan and update Dr. Carolynne Edouard.

## 2013-06-03 ENCOUNTER — Ambulatory Visit: Payer: Medicare Other | Admitting: Family Medicine

## 2013-06-08 ENCOUNTER — Encounter (HOSPITAL_COMMUNITY): Payer: Self-pay | Admitting: Pharmacy Technician

## 2013-06-09 ENCOUNTER — Encounter (HOSPITAL_COMMUNITY)
Admission: RE | Admit: 2013-06-09 | Discharge: 2013-06-09 | Disposition: A | Discharge status: Home / Self Care | Payer: Medicare Other | Source: Ambulatory Visit | Attending: General Surgery | Admitting: General Surgery

## 2013-06-09 ENCOUNTER — Encounter (HOSPITAL_COMMUNITY): Payer: Self-pay

## 2013-06-09 DIAGNOSIS — E119 Type 2 diabetes mellitus without complications: Secondary | ICD-10-CM | POA: Insufficient documentation

## 2013-06-09 DIAGNOSIS — I700 Atherosclerosis of aorta: Secondary | ICD-10-CM | POA: Insufficient documentation

## 2013-06-09 DIAGNOSIS — Z01812 Encounter for preprocedural laboratory examination: Secondary | ICD-10-CM | POA: Insufficient documentation

## 2013-06-09 DIAGNOSIS — I10 Essential (primary) hypertension: Secondary | ICD-10-CM | POA: Insufficient documentation

## 2013-06-09 DIAGNOSIS — C50919 Malignant neoplasm of unspecified site of unspecified female breast: Secondary | ICD-10-CM | POA: Insufficient documentation

## 2013-06-09 DIAGNOSIS — Z01818 Encounter for other preprocedural examination: Secondary | ICD-10-CM | POA: Insufficient documentation

## 2013-06-09 DIAGNOSIS — M329 Systemic lupus erythematosus, unspecified: Secondary | ICD-10-CM | POA: Insufficient documentation

## 2013-06-09 HISTORY — DX: Other specified postprocedural states: Z98.890

## 2013-06-09 HISTORY — DX: Other specified postprocedural states: R11.2

## 2013-06-09 LAB — CBC
Hemoglobin: 12.4 g/dL (ref 12.0–15.0)
MCHC: 33.5 g/dL (ref 30.0–36.0)
RBC: 4.44 MIL/uL (ref 3.87–5.11)

## 2013-06-09 LAB — BASIC METABOLIC PANEL
GFR calc non Af Amer: 84 mL/min — ABNORMAL LOW (ref >90)
Glucose, Bld: 209 mg/dL — ABNORMAL HIGH (ref 70–99)
Potassium: 3.9 mEq/L (ref 3.5–5.1)
Sodium: 140 mEq/L (ref 135–145)

## 2013-06-09 MED ORDER — CHLORHEXIDINE GLUCONATE 4 % EX LIQD: 1.0000 "application " | Freq: Once | CUTANEOUS | Status: DC

## 2013-06-09 NOTE — Pre-Procedure Instructions (Signed)
[name]  06/09/2013   Your procedure is scheduled on:  06/17/13  Report to [facility] at 9 AM.  Call this number if you have problems the morning of surgery: [phone]   Remember:   Do not eat food or drink liquids after midnight.   Take these medicines the morning of surgery with A SIP OF WATER: clartin   Do not wear jewelry, make-up or nail polish.  Do not wear lotions, powders, or perfumes. You may wear deodorant.  Do not shave 48 hours prior to surgery. Men may shave face and neck.  Do not bring valuables to the hospital.  [facility] is not responsible                   for any belongings or valuables.  Contacts, dentures or bridgework may not be worn into surgery.  Leave suitcase in the car. After surgery it may be brought to your room.  For patients admitted to the hospital, checkout time is 11:00 AM the day of  discharge.   Patients discharged the day of surgery will not be allowed to drive  home.  Name and phone number of your driver: family  Special Instructions: Shower using CHG 2 nights before surgery and the night before surgery.  If you shower the day of surgery use CHG.  Use special wash - you have one bottle of CHG for all showers.  You should use approximately 1/3 of the bottle for each shower.   Please read over the following fact sheets that you were given: Pain Booklet, Coughing and Deep Breathing and Surgical Site Infection Prevention

## 2013-06-11 ENCOUNTER — Other Ambulatory Visit: Payer: Self-pay | Admitting: Family Medicine

## 2013-06-13 ENCOUNTER — Encounter: Payer: Self-pay | Admitting: Family

## 2013-06-13 ENCOUNTER — Ambulatory Visit (INDEPENDENT_AMBULATORY_CARE_PROVIDER_SITE_OTHER): Payer: Medicare Other | Admitting: Family

## 2013-06-13 VITALS — BP 140/68 | HR 77 | Temp 97.8°F | Resp 18 | Wt 202.0 lb

## 2013-06-13 DIAGNOSIS — L0390 Cellulitis, unspecified: Secondary | ICD-10-CM

## 2013-06-13 DIAGNOSIS — L0291 Cutaneous abscess, unspecified: Secondary | ICD-10-CM

## 2013-06-13 DIAGNOSIS — C50919 Malignant neoplasm of unspecified site of unspecified female breast: Secondary | ICD-10-CM

## 2013-06-13 DIAGNOSIS — C50911 Malignant neoplasm of unspecified site of right female breast: Secondary | ICD-10-CM

## 2013-06-13 NOTE — Progress Notes (Signed)
Subjective:    Patient ID: [name], female    DOB: 19-Aug-1941, 72 y.o.   MRN: [id number]  HPI  [name] is a 72 yr old female who presents today for follow up.  Breast Cancer-  She is scheduled for right breast wire localized lumpectomy and sentinel node mapping on 8/1 with Dr. Carolynne Edouard.  She is currently on a lovenox bridge and off coumadin  5 days   DM2- reports sugars have been elevated. 275 this morning.  She is following with Dr. Allena Katz.  Denies any recent hypoglycemic events.   Cellulitis-  She reports ongoing discoloration of both shins R>L.     Review of Systems See HPI  Past Medical History  Diagnosis Date  . Hyperplastic colon polyp   . Diabetes mellitus type II   . Osteoarthritis   . GERD (gastroesophageal reflux disease)   . Osteopenia   . Lupus     of skin see dermatologist frequently  . OA (osteoarthritis of spine)     C-spine  . DVT (deep venous thrombosis)     right leg DVT 05-17-2008  . Leg swelling     left leg 2-10, u/s showed a old clot, has a hypercoag panel,  . History of cardiovascular stress test     10/09 had CP, stress test neg, likely GI (GERD)-09/2006-had CP: stress test (-)  . 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 DVT (deep vein thrombosis) in pregnancy   . DVT of leg (deep venous thrombosis) 07/28/2011  . 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 Allergy   . Clotting disorder     hx of PE and DVT  . C. difficile diarrhea     hx of,   . 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 Skin cancer     skin CA  . Left leg pain 03/12/2013  . Otitis externa 03/19/2013    right  . PONV (postoperative nausea and vomiting)   . Hypertension     pcp  dr Lendell Caprice   h.p   lebaur    History   Social History  . Marital Status: Widowed    Spouse Name: N/A    Number of Children: 3  . Years of Education: N/A   Occupational History  . Retired    Social History Main  Topics  . Smoking status: Never Smoker   . Smokeless tobacco: Never Used  . Alcohol Use: No  . Drug Use: No  . Sexually Active: Not Currently   Other Topics Concern  . Not on file   Social History Narrative   Single-widow    3 children , 1 in [place] (son, daughter - [place])    [place] from [place], [place]   Alcohol Use - no      tobacco-- never     Illicit Drug Use - no    Pt is Jehovah's Witness-No blood products   Daughter - Sheronda Parran           Past Surgical History  Procedure Laterality Date  . Cholecystectomy    . Breast biopsy      left breast and right breast  . Breast lumpectomy      left breast-ductal carcinoma in situ  . Appendectomy    . Skin cancer excision      from back     Family  History  Problem Relation Age of Onset  . Diabetes Mother   . Diabetes    . Heart attack Neg Hx   . Colon cancer Neg Hx   . Breast cancer Neg Hx   . Esophageal cancer Neg Hx   . Rectal cancer Neg Hx   . Stomach cancer Neg Hx     Allergies  Allergen Reactions  . Fluconazole Other (See Comments)    REACTION: severe fatigue and muscle weakness  . Metformin Diarrhea  . Pioglitazone Other (See Comments)    REACTION: wt gain  . Vancomycin     Erythematous rash, hands and toes became cyanotic.      Current Outpatient Prescriptions on File Prior to Visit  Medication Sig Dispense Refill  . B Complex-C (SUPER B COMPLEX PO) Take 1 tablet by mouth daily.      . Calcium Carbonate-Vitamin D (CALCIUM-VITAMIN D) 600-200 MG-UNIT CAPS Take 1 tablet by mouth daily.       . diclofenac sodium (VOLTAREN) 1 % GEL Apply 4 g topically 3 (three) times daily.      . Enoxaparin Sodium (LOVENOX IJ) Inject 130 Units as directed daily.       . furosemide (LASIX) 20 MG tablet Take 20 mg by mouth daily as needed for fluid.      Marland Kitchen glucosamine-chondroitin 500-400 MG tablet Take 1 tablet by mouth daily.       . insulin glargine (LANTUS) 100 UNIT/ML injection Inject 40 Units into the  skin at bedtime.      . insulin regular (NOVOLIN R,HUMULIN R) 100 units/mL injection Inject 20-36 Units into the skin 3 (three) times daily before meals.      Marland Kitchen lisinopril (PRINIVIL,ZESTRIL) 40 MG tablet Take 40 mg by mouth daily.      Marland Kitchen loratadine (CLARITIN) 10 MG tablet Take 10 mg by mouth daily as needed for allergies.      . niacin 500 MG tablet Take 500 mg by mouth daily.       Marland Kitchen warfarin (COUMADIN) 5 MG tablet Take 5-7.5 mg by mouth daily. Take 7.5mg  on Monday thru Friday, and 5mg t on Saturday and Sunday.       No current facility-administered medications on file prior to visit.    Wt 202 lb (91.627 kg)  BMI 31.63 kg/m2       Objective:   Physical Exam  Constitutional: She appears well-developed and well-nourished. No distress.  Cardiovascular: Normal rate and regular rhythm.   No murmur heard. Pulmonary/Chest: Effort normal and breath sounds normal. No respiratory distress. She has no wheezes. She has no rales. She exhibits no tenderness.  Musculoskeletal:  2+ LLE swelling, 1+ RLE swelling.    Skin:  hyperpigmentation bilateral shins due to chronic venous stasis.           Assessment & Plan:

## 2013-06-13 NOTE — Assessment & Plan Note (Signed)
Fair control.  Management per endo.   

## 2013-06-13 NOTE — Assessment & Plan Note (Signed)
Resolved

## 2013-06-13 NOTE — Patient Instructions (Addendum)
Good luck with your upcoming procedure. Please follow up in 2 months, sooner if problems/concerns.

## 2013-06-13 NOTE — Assessment & Plan Note (Signed)
For lumpectomy on Friday. Perioperative anticoagulation is being managed by [facility].

## 2013-06-16 MED ORDER — CEFAZOLIN SODIUM-DEXTROSE 2-3 GM-% IV SOLR
2.0000 g | INTRAVENOUS | Status: AC
  Administered 2013-06-17: 2 g via INTRAVENOUS
  Filled 2013-06-16: qty 50

## 2013-06-17 ENCOUNTER — Encounter (HOSPITAL_COMMUNITY)
Admission: RE | Disposition: A | Discharge status: Home / Self Care | Payer: Self-pay | Source: Ambulatory Visit | Attending: General Surgery

## 2013-06-17 ENCOUNTER — Ambulatory Visit
Admission: RE | Admit: 2013-06-17 | Discharge: 2013-06-17 | Disposition: A | Discharge status: Home / Self Care | Payer: Medicare Other | Source: Ambulatory Visit | Attending: General Surgery | Admitting: General Surgery

## 2013-06-17 ENCOUNTER — Encounter (HOSPITAL_COMMUNITY): Payer: Self-pay | Admitting: Surgery

## 2013-06-17 ENCOUNTER — Encounter (HOSPITAL_COMMUNITY)
Admission: RE | Admit: 2013-06-17 | Discharge: 2013-06-17 | Disposition: A | Discharge status: Home / Self Care | Payer: Medicare Other | Source: Ambulatory Visit | Attending: General Surgery | Admitting: General Surgery

## 2013-06-17 ENCOUNTER — Ambulatory Visit (HOSPITAL_COMMUNITY): Payer: Medicare Other | Admitting: Anesthesiology

## 2013-06-17 ENCOUNTER — Encounter (HOSPITAL_COMMUNITY): Payer: Self-pay | Admitting: Anesthesiology

## 2013-06-17 ENCOUNTER — Ambulatory Visit (HOSPITAL_COMMUNITY)
Admission: RE | Admit: 2013-06-17 | Discharge: 2013-06-18 | Disposition: A | Discharge status: Home / Self Care | Payer: Medicare Other | Source: Ambulatory Visit | Attending: General Surgery | Admitting: General Surgery

## 2013-06-17 DIAGNOSIS — C50911 Malignant neoplasm of unspecified site of right female breast: Secondary | ICD-10-CM

## 2013-06-17 DIAGNOSIS — Z794 Long term (current) use of insulin: Secondary | ICD-10-CM | POA: Insufficient documentation

## 2013-06-17 DIAGNOSIS — Z7901 Long term (current) use of anticoagulants: Secondary | ICD-10-CM | POA: Insufficient documentation

## 2013-06-17 DIAGNOSIS — Z86718 Personal history of other venous thrombosis and embolism: Secondary | ICD-10-CM | POA: Insufficient documentation

## 2013-06-17 DIAGNOSIS — Z79899 Other long term (current) drug therapy: Secondary | ICD-10-CM | POA: Insufficient documentation

## 2013-06-17 DIAGNOSIS — E119 Type 2 diabetes mellitus without complications: Secondary | ICD-10-CM | POA: Insufficient documentation

## 2013-06-17 DIAGNOSIS — D0590 Unspecified type of carcinoma in situ of unspecified breast: Secondary | ICD-10-CM

## 2013-06-17 DIAGNOSIS — I10 Essential (primary) hypertension: Secondary | ICD-10-CM | POA: Insufficient documentation

## 2013-06-17 DIAGNOSIS — C50219 Malignant neoplasm of upper-inner quadrant of unspecified female breast: Secondary | ICD-10-CM | POA: Insufficient documentation

## 2013-06-17 DIAGNOSIS — C50919 Malignant neoplasm of unspecified site of unspecified female breast: Secondary | ICD-10-CM | POA: Insufficient documentation

## 2013-06-17 HISTORY — PX: BREAST LUMPECTOMY WITH NEEDLE LOCALIZATION AND AXILLARY SENTINEL LYMPH NODE BX: SHX5760

## 2013-06-17 LAB — GLUCOSE, CAPILLARY
Glucose-Capillary: 356 mg/dL — ABNORMAL HIGH (ref 70–99)
Glucose-Capillary: 235 mg/dL — ABNORMAL HIGH (ref 70–99)

## 2013-06-17 LAB — APTT: aPTT: 29 seconds (ref 24–37)

## 2013-06-17 SURGERY — BREAST LUMPECTOMY WITH NEEDLE LOCALIZATION AND AXILLARY SENTINEL LYMPH NODE BX
Anesthesia: General | Site: Breast | Laterality: Right | Wound class: Clean

## 2013-06-17 MED ORDER — ONDANSETRON HCL 4 MG/2ML IJ SOLN: 4.0000 mg | Freq: Four times a day (QID) | INTRAMUSCULAR | Status: DC | PRN

## 2013-06-17 MED ORDER — STERILE WATER FOR INJECTION IJ SOLN
INTRAMUSCULAR | Status: AC
  Filled 2013-06-17: qty 10

## 2013-06-17 MED ORDER — OXYCODONE HCL 5 MG/5ML PO SOLN: 5.0000 mg | Freq: Once | ORAL | Status: DC | PRN

## 2013-06-17 MED ORDER — FENTANYL CITRATE 0.05 MG/ML IJ SOLN
50.0000 ug | INTRAMUSCULAR | Status: DC | PRN
  Administered 2013-06-17: 50 ug via INTRAVENOUS

## 2013-06-17 MED ORDER — WARFARIN SODIUM 7.5 MG PO TABS
7.5000 mg | ORAL_TABLET | ORAL | Status: DC
  Administered 2013-06-17: 7.5 mg via ORAL
  Filled 2013-06-17: qty 1

## 2013-06-17 MED ORDER — ONDANSETRON HCL 4 MG/2ML IJ SOLN
INTRAMUSCULAR | Status: AC
  Filled 2013-06-17: qty 2

## 2013-06-17 MED ORDER — METOCLOPRAMIDE HCL 5 MG/ML IJ SOLN
INTRAMUSCULAR | Status: AC
  Administered 2013-06-17: 10 mg
  Filled 2013-06-17: qty 2

## 2013-06-17 MED ORDER — HYDROMORPHONE HCL PF 1 MG/ML IJ SOLN
INTRAMUSCULAR | Status: AC
  Filled 2013-06-17: qty 1

## 2013-06-17 MED ORDER — MORPHINE SULFATE 4 MG/ML IJ SOLN: 4.0000 mg | INTRAMUSCULAR | Status: DC | PRN

## 2013-06-17 MED ORDER — ONDANSETRON HCL 4 MG/2ML IJ SOLN
INTRAMUSCULAR | Status: DC | PRN
  Administered 2013-06-17: 4 mg via INTRAVENOUS

## 2013-06-17 MED ORDER — ONDANSETRON HCL 4 MG/2ML IJ SOLN
4.0000 mg | Freq: Once | INTRAMUSCULAR | Status: AC | PRN
  Administered 2013-06-17: 4 mg via INTRAVENOUS

## 2013-06-17 MED ORDER — LISINOPRIL 40 MG PO TABS
40.0000 mg | ORAL_TABLET | Freq: Every day | ORAL | Status: DC
  Administered 2013-06-17 – 2013-06-18 (×2): 40 mg via ORAL
  Filled 2013-06-17 (×2): qty 1

## 2013-06-17 MED ORDER — WARFARIN SODIUM 5 MG PO TABS
5.0000 mg | ORAL_TABLET | ORAL | Status: DC
  Filled 2013-06-17: qty 1

## 2013-06-17 MED ORDER — OXYCODONE HCL 5 MG PO TABS: 5.0000 mg | ORAL_TABLET | Freq: Once | ORAL | Status: DC | PRN

## 2013-06-17 MED ORDER — PROMETHAZINE HCL 25 MG/ML IJ SOLN
INTRAMUSCULAR | Status: AC
  Administered 2013-06-17: 25 mg via INTRAMUSCULAR
  Filled 2013-06-17: qty 1

## 2013-06-17 MED ORDER — GLYCOPYRROLATE 0.2 MG/ML IJ SOLN
INTRAMUSCULAR | Status: DC | PRN
  Administered 2013-06-17: .6 mg via INTRAVENOUS

## 2013-06-17 MED ORDER — PROPOFOL 10 MG/ML IV BOLUS
INTRAVENOUS | Status: DC | PRN
  Administered 2013-06-17: 100 mg via INTRAVENOUS

## 2013-06-17 MED ORDER — METHYLENE BLUE 1 % INJ SOLN
INTRAMUSCULAR | Status: AC
  Filled 2013-06-17: qty 10

## 2013-06-17 MED ORDER — KCL IN DEXTROSE-NACL 20-5-0.9 MEQ/L-%-% IV SOLN
INTRAVENOUS | Status: DC
  Administered 2013-06-17: 20 mL/h via INTRAVENOUS
  Filled 2013-06-17: qty 1000

## 2013-06-17 MED ORDER — TECHNETIUM TC 99M SULFUR COLLOID FILTERED
1.0000 | Freq: Once | INTRAVENOUS | Status: AC | PRN
  Administered 2013-06-17: 1 via INTRADERMAL

## 2013-06-17 MED ORDER — WARFARIN SODIUM 5 MG PO TABS: 5.0000 mg | ORAL_TABLET | Freq: Every day | ORAL | Status: DC

## 2013-06-17 MED ORDER — NIACIN 500 MG PO TABS
500.0000 mg | ORAL_TABLET | Freq: Every day | ORAL | Status: DC
  Administered 2013-06-17: 500 mg via ORAL
  Filled 2013-06-17 (×2): qty 1

## 2013-06-17 MED ORDER — LIDOCAINE HCL (CARDIAC) 20 MG/ML IV SOLN
INTRAVENOUS | Status: DC | PRN
  Administered 2013-06-17: 100 mg via INTRAVENOUS

## 2013-06-17 MED ORDER — FUROSEMIDE 20 MG PO TABS: 20.0000 mg | ORAL_TABLET | Freq: Every day | ORAL | Status: DC | PRN

## 2013-06-17 MED ORDER — FENTANYL CITRATE 0.05 MG/ML IJ SOLN
INTRAMUSCULAR | Status: AC
  Filled 2013-06-17: qty 2

## 2013-06-17 MED ORDER — INSULIN ASPART 100 UNIT/ML ~~LOC~~ SOLN: 10.0000 [IU] | Freq: Once | SUBCUTANEOUS | Status: AC

## 2013-06-17 MED ORDER — MIDAZOLAM HCL 2 MG/2ML IJ SOLN: 1.0000 mg | INTRAMUSCULAR | Status: DC | PRN

## 2013-06-17 MED ORDER — NEOSTIGMINE METHYLSULFATE 1 MG/ML IJ SOLN
INTRAMUSCULAR | Status: DC | PRN
  Administered 2013-06-17: 4 mg via INTRAVENOUS

## 2013-06-17 MED ORDER — HYDROMORPHONE HCL PF 1 MG/ML IJ SOLN
0.2500 mg | INTRAMUSCULAR | Status: DC | PRN
  Administered 2013-06-17: 0.5 mg via INTRAVENOUS

## 2013-06-17 MED ORDER — HYDROCODONE-ACETAMINOPHEN 5-325 MG PO TABS: 1.0000 | ORAL_TABLET | Freq: Four times a day (QID) | ORAL | Status: DC | PRN

## 2013-06-17 MED ORDER — MIDAZOLAM HCL 5 MG/5ML IJ SOLN
INTRAMUSCULAR | Status: DC | PRN
  Administered 2013-06-17 (×2): 1 mg via INTRAVENOUS

## 2013-06-17 MED ORDER — ROCURONIUM BROMIDE 100 MG/10ML IV SOLN
INTRAVENOUS | Status: DC | PRN
  Administered 2013-06-17: 40 mg via INTRAVENOUS

## 2013-06-17 MED ORDER — WARFARIN - PHYSICIAN DOSING INPATIENT: Freq: Every day | Status: DC

## 2013-06-17 MED ORDER — BUPIVACAINE-EPINEPHRINE 0.25% -1:200000 IJ SOLN
INTRAMUSCULAR | Status: DC | PRN
  Administered 2013-06-17: 20 mL

## 2013-06-17 MED ORDER — BUPIVACAINE-EPINEPHRINE PF 0.25-1:200000 % IJ SOLN
INTRAMUSCULAR | Status: AC
  Filled 2013-06-17: qty 30

## 2013-06-17 MED ORDER — ONDANSETRON HCL 4 MG PO TABS: 4.0000 mg | ORAL_TABLET | Freq: Four times a day (QID) | ORAL | Status: DC | PRN

## 2013-06-17 MED ORDER — LACTATED RINGERS IV SOLN: INTRAVENOUS | Status: DC

## 2013-06-17 MED ORDER — INSULIN GLARGINE 100 UNIT/ML ~~LOC~~ SOLN
40.0000 [IU] | Freq: Every day | SUBCUTANEOUS | Status: DC
  Administered 2013-06-17: 40 [IU] via SUBCUTANEOUS
  Filled 2013-06-17 (×2): qty 0.4

## 2013-06-17 MED ORDER — 0.9 % SODIUM CHLORIDE (POUR BTL) OPTIME
TOPICAL | Status: DC | PRN
  Administered 2013-06-17: 1000 mL

## 2013-06-17 MED ORDER — LACTATED RINGERS IV SOLN
INTRAVENOUS | Status: DC | PRN
  Administered 2013-06-17: 11:00:00 via INTRAVENOUS

## 2013-06-17 MED ORDER — HYDROCODONE-ACETAMINOPHEN 5-325 MG PO TABS
1.0000 | ORAL_TABLET | ORAL | Status: DC | PRN
  Filled 2013-06-17: qty 1

## 2013-06-17 MED ORDER — METHYLENE BLUE 1 % INJ SOLN
INTRAMUSCULAR | Status: DC | PRN
  Administered 2013-06-17: 13:00:00

## 2013-06-17 MED ORDER — INSULIN ASPART 100 UNIT/ML ~~LOC~~ SOLN
SUBCUTANEOUS | Status: AC
  Administered 2013-06-17: 10 [IU] via SUBCUTANEOUS
  Filled 2013-06-17: qty 1

## 2013-06-17 MED ORDER — INSULIN ASPART 100 UNIT/ML ~~LOC~~ SOLN
0.0000 [IU] | Freq: Three times a day (TID) | SUBCUTANEOUS | Status: DC
  Administered 2013-06-18: 5 [IU] via SUBCUTANEOUS

## 2013-06-17 MED ORDER — FENTANYL CITRATE 0.05 MG/ML IJ SOLN
INTRAMUSCULAR | Status: DC | PRN
  Administered 2013-06-17 (×5): 50 ug via INTRAVENOUS
  Administered 2013-06-17: 100 ug via INTRAVENOUS

## 2013-06-17 SURGICAL SUPPLY — 52 items
ADH SKN CLS APL DERMABOND .7 (GAUZE/BANDAGES/DRESSINGS) ×1
ADH SKN CLS LQ APL DERMABOND (GAUZE/BANDAGES/DRESSINGS) ×1
APPLIER CLIP 9.375 MED OPEN (MISCELLANEOUS) ×2
APR CLP MED 9.3 20 MLT OPN (MISCELLANEOUS) ×1
BLADE SURG 10 STRL SS (BLADE) ×2 IMPLANT
BLADE SURG 15 STRL LF DISP TIS (BLADE) ×1 IMPLANT
BLADE SURG 15 STRL SS (BLADE) ×2
CANISTER SUCTION 2500CC (MISCELLANEOUS) ×2 IMPLANT
CHLORAPREP W/TINT 26ML (MISCELLANEOUS) ×2 IMPLANT
CLIP APPLIE 9.375 MED OPEN (MISCELLANEOUS) IMPLANT
CLOTH BEACON ORANGE TIMEOUT ST (SAFETY) ×2 IMPLANT
CONT SPEC 4OZ CLIKSEAL STRL BL (MISCELLANEOUS) ×4 IMPLANT
COVER PROBE W GEL 5X96 (DRAPES) ×2 IMPLANT
COVER SURGICAL LIGHT HANDLE (MISCELLANEOUS) ×2 IMPLANT
DERMABOND ADHESIVE PROPEN (GAUZE/BANDAGES/DRESSINGS) ×1
DERMABOND ADVANCED (GAUZE/BANDAGES/DRESSINGS) ×1
DERMABOND ADVANCED .7 DNX12 (GAUZE/BANDAGES/DRESSINGS) ×1 IMPLANT
DERMABOND ADVANCED .7 DNX6 (GAUZE/BANDAGES/DRESSINGS) IMPLANT
DEVICE DUBIN SPECIMEN MAMMOGRA (MISCELLANEOUS) ×2 IMPLANT
DRAPE CHEST BREAST 15X10 FENES (DRAPES) ×2 IMPLANT
DRAPE UTILITY 15X26 W/TAPE STR (DRAPE) ×4 IMPLANT
ELECT COATED BLADE 2.86 ST (ELECTRODE) ×2 IMPLANT
ELECT REM PT RETURN 9FT ADLT (ELECTROSURGICAL) ×2
ELECTRODE REM PT RTRN 9FT ADLT (ELECTROSURGICAL) ×1 IMPLANT
GLOVE BIO SURGEON STRL SZ7 (GLOVE) ×1 IMPLANT
GLOVE BIO SURGEON STRL SZ7.5 (GLOVE) ×4 IMPLANT
GLOVE BIOGEL PI IND STRL 6 (GLOVE) IMPLANT
GLOVE BIOGEL PI INDICATOR 6 (GLOVE) ×2
GLOVE SURG SS PI 6.5 STRL IVOR (GLOVE) ×1 IMPLANT
GOWN STRL NON-REIN LRG LVL3 (GOWN DISPOSABLE) ×4 IMPLANT
KIT BASIN OR (CUSTOM PROCEDURE TRAY) ×2 IMPLANT
KIT MARKER MARGIN INK (KITS) ×1 IMPLANT
KIT ROOM TURNOVER OR (KITS) ×2 IMPLANT
NDL 18GX1X1/2 (RX/OR ONLY) (NEEDLE) ×1 IMPLANT
NDL HYPO 25GX1X1/2 BEV (NEEDLE) ×2 IMPLANT
NEEDLE 18GX1X1/2 (RX/OR ONLY) (NEEDLE) ×2 IMPLANT
NEEDLE HYPO 25GX1X1/2 BEV (NEEDLE) ×4 IMPLANT
NS IRRIG 1000ML POUR BTL (IV SOLUTION) ×2 IMPLANT
PACK SURGICAL SETUP 50X90 (CUSTOM PROCEDURE TRAY) ×2 IMPLANT
PAD ARMBOARD 7.5X6 YLW CONV (MISCELLANEOUS) ×2 IMPLANT
PENCIL BUTTON HOLSTER BLD 10FT (ELECTRODE) ×2 IMPLANT
SPONGE LAP 18X18 X RAY DECT (DISPOSABLE) ×2 IMPLANT
SUT MNCRL AB 4-0 PS2 18 (SUTURE) ×2 IMPLANT
SUT VIC AB 3-0 54X BRD REEL (SUTURE) ×1 IMPLANT
SUT VIC AB 3-0 BRD 54 (SUTURE) ×2
SUT VIC AB 3-0 SH 18 (SUTURE) ×2 IMPLANT
SYR BULB 3OZ (MISCELLANEOUS) ×2 IMPLANT
SYR CONTROL 10ML LL (SYRINGE) ×4 IMPLANT
TOWEL OR 17X24 6PK STRL BLUE (TOWEL DISPOSABLE) ×2 IMPLANT
TOWEL OR 17X26 10 PK STRL BLUE (TOWEL DISPOSABLE) ×2 IMPLANT
TUBE CONNECTING 12X1/4 (SUCTIONS) ×2 IMPLANT
YANKAUER SUCT BULB TIP NO VENT (SUCTIONS) ×2 IMPLANT

## 2013-06-17 NOTE — Anesthesia Procedure Notes (Signed)
Procedure Name: Intubation Date/Time: 06/17/2013 11:59 AM Performed by: Armandina Gemma Pre-anesthesia Checklist: Timeout performed, Patient identified, Emergency Drugs available, Suction available and Patient being monitored Patient Re-evaluated:Patient Re-evaluated prior to inductionOxygen Delivery Method: Circle system utilized Preoxygenation: Pre-oxygenation with 100% oxygen Intubation Type: IV induction Ventilation: Mask ventilation without difficulty Laryngoscope Size: Mac and 3 Grade View: Grade I Tube size: 7.0 mm Number of attempts: 1 Airway Equipment and Method: Stylet Placement Confirmation: ETT inserted through vocal cords under direct vision,  breath sounds checked- equal and bilateral and positive ETCO2 Secured at: 22 cm Tube secured with: Tape Dental Injury: Teeth and Oropharynx as per pre-operative assessment  Comments: IV induction Ossey- intubation Rupp SRNA- assisted and supervised by [employer]

## 2013-06-17 NOTE — H&P (View-Only) (Signed)
Subjective:     Patient ID: [name], female   DOB: 01/22/1941, 72 y.o.   MRN: [id number]  HPI The patient is a 72-year-old white female who was recently diagnosed with a 1 cm cancer in the central right breast. Since her last visit she had a second area biopsied and came back benign. She did have significant bruising with both biopsies secondary to being on Coumadin for DVT and PE. Clinically her nodes are negative. At this point she returns to discuss treatment options.  Review of Systems  Constitutional: Negative.   HENT: Negative.   Eyes: Negative.   Respiratory: Negative.   Cardiovascular: Negative.   Gastrointestinal: Negative.   Endocrine: Negative.   Genitourinary: Negative.   Musculoskeletal: Negative.   Skin: Negative.   Allergic/Immunologic: Negative.   Neurological: Negative.   Hematological: Bruises/bleeds easily.  Psychiatric/Behavioral: Negative.        Objective:   Physical Exam  Constitutional: She is oriented to person, place, and time. She appears well-developed and well-nourished.  HENT:  Head: Normocephalic and atraumatic.  Eyes: Conjunctivae and EOM are normal. Pupils are equal, round, and reactive to light.  Neck: Normal range of motion. Neck supple.  Cardiovascular: Normal rate, regular rhythm and normal heart sounds.   Pulmonary/Chest: Effort normal and breath sounds normal.  There is a large hematoma central and inferior in the right breast. Otherwise there is no other palpable mass in either breast. There is no palpable axillary, cervical, or supraclavicular lymphadenopathy  Abdominal: Soft. Bowel sounds are normal. She exhibits no mass. There is no tenderness.  Musculoskeletal: Normal range of motion.  Lymphadenopathy:    She has no cervical adenopathy.  Neurological: She is alert and oriented to person, place, and time.  Skin: Skin is warm and dry.  Psychiatric: She has a normal mood and affect. Her behavior is normal.       Assessment:      The patient has what appears to be a small early stage cancer in the central right breast. I've discussed with her in detail the different options for treatment and she favors breast conservation. I think this is a reasonable choice. She is also a good candidate for sentinel node mapping. I have discussed with her in detail the risks and benefits of the operation as well as some of the technical aspects and she understands and wishes to proceed     Plan:     Plan for right breast wire localized lumpectomy and sentinel node mapping. She will need to stop her Coumadin 5 days before surgery.      

## 2013-06-17 NOTE — Progress Notes (Signed)
Pt requests to sit on bedpan.  States she is dribbling urine and cannot stop.

## 2013-06-17 NOTE — Progress Notes (Signed)
PHARMACIST - PHYSICIAN COMMUNICATION DR:  Carolynne Edouard CONCERNING: Pharmacy Care Issues Regarding Warfarin Labs  RECOMMENDATION (Action Taken): A daily protime for three days has been ordered to meet the [facility] National Patient safety goal and comply with the current [facility] policy.   The Pharmacy will defer all warfarin dose order changes and follow up of lab results to the prescriber unless an additional order to initiate a "pharmacy Coumadin consult" is placed.  DESCRIPTION:  While hospitalized, to be in compliance with The Joint Commission National Patient Safety Goals, all patients on warfarin must have a baseline and/or current protime prior to the administration of warfarin. Pharmacy has received your order for warfarin without these required laboratory assessments.  Nicolette Bang, RPh Pager: [phone] 06/17/2013 8:44 PM

## 2013-06-17 NOTE — Preoperative (Signed)
Beta Blockers   Reason not to administer Beta Blockers:Not Applicable 

## 2013-06-17 NOTE — Op Note (Signed)
06/17/2013  1:31 PM  PATIENT:  [name]  72 y.o. female  PRE-OPERATIVE DIAGNOSIS:  Right breast cancer  POST-OPERATIVE DIAGNOSIS:  Right breast cancer  PROCEDURE:  Procedure(s): BREAST LUMPECTOMY WITH NEEDLE LOCALIZATION AND AXILLARY SENTINEL LYMPH NODE BX (Right)  SURGEON:  Surgeon(s) and Role:    * [name], MD - Primary  PHYSICIAN ASSISTANT:   ASSISTANTS: none   ANESTHESIA:   general  EBL:     BLOOD ADMINISTERED:none  DRAINS: none   LOCAL MEDICATIONS USED:  MARCAINE     SPECIMEN:  Source of Specimen:  right breast tissue and sentinel nodes X 2  DISPOSITION OF SPECIMEN:  PATHOLOGY  COUNTS:  YES  TOURNIQUET:  * No tourniquets in log *  DICTATION: .Dragon Dictation After informed consent was obtained the patient was brought to the operating room and placed in the supine position on the operating room table. After adequate induction of general anesthesia the patient's right chest, breast, and axilla were prepped with ChloraPrep, allowed to dry, and draped in usual sterile manner. Earlier in the day the patient underwent injection of 1 mCi technetium sulfur colloid in the subareolar position on the right. Also earlier in the day the patient underwent wire localization procedure and there were 2 wires entering the medial right breast and headed laterally. At this point, 2 cc methylene blue and 3 cc of injectable saline were also injected in the subareolar position the breast was massaged for several minutes. A neoprobe device was used to identify a hot spot in the right axilla. A small transverse incision was made in the axilla overlying the hot spot with a 15 blade knife. This incision was carried through the skin and subcutaneous to sharply with electrocautery until the axilla was entered. The [place] retractor was deployed. Using the neoprobe blunt dissection was carried out in the right axilla until a hot lymph node was identified. This was excised sharply with  the electrocautery and the lymphatics were clipped. Ex vivo counts on this sentinel node #1 were approximately 500. There may have been a second non-hot lymph node in the specimen. A second hot lymph node was also identified and excised in a similar manner. Ex vivo counts on sentinel node #2 were approximately 50. There were no other hot, blue, or palpable lymph nodes in the right axilla. The wound was irrigated with saline and infiltrated with quarter percent Marcaine. Hemostasis was achieved using electrocautery. The deep layer the wound was closed with interrupted 3-0 Vicryl stitches. The skin was closed with a running 4-0 Monocryl subcuticular stitch. Attention was then turned to the right breast. A transversely oriented curvilinear incision was made on the upper inner right breast overlying the wires. This incision was carried through the skin and subcutaneous tissue sharply with the electrocautery until the breast tissue was entered. Once into the breast tissue the path of the wires could be palpated. A circular portion of breast tissue was excised sharply around the path of the wires with the electrocautery. This dissection was carried all the way to the chest wall. Once the specimen was removed it was oriented according to the assigned paint colors. A specimen radiograph was obtained that showed the clip in wires to be in the center of the specimen. The specimen was then sent to pathology for further evaluation. Hemostasis was achieved using electrocautery. The wound was irrigated with copious amounts of saline and infiltrated with quarter percent Marcaine. The margins of the cavity were marked with  clips. The deep layer the wound was closed with interrupted 3-0 Vicryl stitches. The skin was then closed with interrupted 4-0 Monocryl subcuticular stitches. Dermabond dressings were applied. The patient tolerated the procedure well. At the end of the case all needle sponge and instrument counts were correct.  The patient was then awakened and taken to recovery in stable condition.  PLAN OF CARE: Discharge to home after PACU  PATIENT DISPOSITION:  PACU - hemodynamically stable.   Delay start of Pharmacological VTE agent (>24hrs) due to surgical blood loss or risk of bleeding: not applicable

## 2013-06-17 NOTE — Progress Notes (Signed)
Dr Noreene Larsson at bedside. Order received for phenergan 25 mg IM.  He has called Dr Carolynne Edouard for admission orders.

## 2013-06-17 NOTE — Interval H&P Note (Signed)
History and Physical Interval Note:  06/17/2013 10:53 AM  [name]  has presented today for surgery, with the diagnosis of right breast cancer  The various methods of treatment have been discussed with the patient and family. After consideration of risks, benefits and other options for treatment, the patient has consented to  Procedure(s): BREAST LUMPECTOMY WITH NEEDLE LOCALIZATION AND AXILLARY SENTINEL LYMPH NODE BX (Right) as a surgical intervention .  The patient's history has been reviewed, patient examined, no change in status, stable for surgery.  I have reviewed the patient's chart and labs.  Questions were answered to the patient's satisfaction.     TOTH III,PAUL S

## 2013-06-17 NOTE — Progress Notes (Signed)
Pt cont to sit on bedpan.

## 2013-06-17 NOTE — Anesthesia Preprocedure Evaluation (Addendum)
Anesthesia Evaluation  Patient identified by MRN, date of birth, ID band Patient awake    Reviewed: Allergy & Precautions, H&P   History of Anesthesia Complications (+) PONV  Airway Mallampati: I TM Distance: >3 FB Neck ROM: Full    Dental  (+) Edentulous Upper and Dental Advisory Given   Pulmonary sleep apnea , PE         Cardiovascular hypertension, Pt. on medications DVT     Neuro/Psych    GI/Hepatic GERD-  Medicated and Controlled,  Endo/Other  diabetes, Poorly Controlled, Type 2, Insulin Dependent  Renal/GU      Musculoskeletal   Abdominal   Peds  Hematology   Anesthesia Other Findings   Reproductive/Obstetrics                         Anesthesia Physical Anesthesia Plan  ASA: III  Anesthesia Plan: General   Post-op Pain Management:    Induction: Intravenous  Airway Management Planned: Oral ETT  Additional Equipment:   Intra-op Plan:   Post-operative Plan: Extubation in OR  Informed Consent: I have reviewed the patients History and Physical, chart, labs and discussed the procedure including the risks, benefits and alternatives for the proposed anesthesia with the patient or authorized representative who has indicated his/her understanding and acceptance.     Plan Discussed with: CRNA and Surgeon  Anesthesia Plan Comments:         Anesthesia Quick Evaluation

## 2013-06-17 NOTE — Progress Notes (Signed)
Pt up to BR without difficulty

## 2013-06-17 NOTE — Progress Notes (Signed)
Pt up to BR and voided.

## 2013-06-17 NOTE — Transfer of Care (Signed)
Immediate Anesthesia Transfer of Care Note  Patient: [name]  Procedure(s) Performed: Procedure(s): BREAST LUMPECTOMY WITH NEEDLE LOCALIZATION AND AXILLARY SENTINEL LYMPH NODE BX (Right)  Patient Location: PACU  Anesthesia Type:General  Level of Consciousness: sedated  Airway & Oxygen Therapy: Patient Spontanous Breathing and Patient connected to nasal cannula oxygen  Post-op Assessment: Report given to PACU RN and Post -op Vital signs reviewed and stable  Post vital signs: Reviewed and stable  Complications: No apparent anesthesia complications

## 2013-06-17 NOTE — Progress Notes (Signed)
This note also relates to the following rows which could not be included: MAP (mmHg) - Cannot attach notes to unvalidated device data   

## 2013-06-18 LAB — GLUCOSE, CAPILLARY
Glucose-Capillary: 332 mg/dL — ABNORMAL HIGH (ref 70–99)
Glucose-Capillary: 275 mg/dL — ABNORMAL HIGH (ref 70–99)

## 2013-06-18 LAB — PROTIME-INR
INR: 1.04 (ref 0.00–1.49)
Prothrombin Time: 13.4 seconds (ref 11.6–15.2)

## 2013-06-18 MED ORDER — ACETAMINOPHEN 325 MG PO TABS
650.0000 mg | ORAL_TABLET | Freq: Four times a day (QID) | ORAL | Status: DC | PRN
  Administered 2013-06-18: 650 mg via ORAL
  Filled 2013-06-18: qty 2

## 2013-06-18 MED ORDER — TRAMADOL HCL 50 MG PO TABS: 50.0000 mg | ORAL_TABLET | Freq: Three times a day (TID) | ORAL | Status: DC | PRN

## 2013-06-18 NOTE — Progress Notes (Signed)
Discharge home. Home discharge instruction given, no question verbalized. 

## 2013-06-18 NOTE — Discharge Summary (Signed)
Physician Discharge Summary  Patient ID: [name] MRN: [id number] DOB/AGE: [birth year] 73 y.o.  Admit date: 06/17/2013 Discharge date: 06/18/2013  Admitting Diagnosis: Right breast cancer  Discharge Diagnosis Patient Active Problem List   Diagnosis Date Noted  . Breast cancer 05/10/2013  . Cellulitis 05/09/2013  . Otitis externa 03/19/2013  . Left leg pain 03/12/2013  . Sciatica of left side 01/24/2013  . Intertrigo 12/20/2012  . Multiple falls 12/20/2012  . Chronic middle ear effusion 12/20/2012  . Abnormal mammogram 09/29/2012  . Anemia 08/30/2012  . H/O exertional chest pain 08/30/2012  . Hearing problem 08/30/2012  . Chronic anticoagulation 04/19/2012  . Eczema 04/18/2012  . Refusal of blood transfusions as patient is Jehovah's Witness 04/13/2012  . Back pain 03/12/2012  . Atypical chest pain 03/02/2012  . Plantar fasciitis 12/19/2011  . Overactive bladder 07/28/2011  . 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 SLEEP APNEA, OBSTRUCTIVE 07/04/2010  . THYROID NODULE 04/29/2010  . DIABETES MELLITUS, TYPE II, UNCONTROLLED 04/29/2010  . HYPERLIPIDEMIA 04/29/2010  . NEUROPATHY 04/29/2010  . Edema 04/29/2010  . HYPERTENSION 10/09/2008  . OSTEOARTHRITIS 10/09/2008  . ADENOCARCINOMA, BREAST, HX OF 10/09/2008  . COLONIC POLYPS, HYPERPLASTIC, HX OF 10/09/2008  . GERD 09/04/2008  . OSTEOPENIA 06/22/2007    Consultants None  Imaging: Nm Sentinel Node Inj-no Rpt (breast)  06/17/2013   CLINICAL DATA: right breast cancer   Sulfur colloid was injected intradermally by the nuclear medicine  technologist for breast cancer sentinel node localization.    Mm Rt Plc Breast Loc Dev   1st Lesion  Inc Mammo Guide  06/17/2013   *RADIOLOGY REPORT*  Clinical Data:  *RADIOLOGY REPORT*  Clinical Data:  Recently diagnosed right breast ductal carcinoma in situ.  The patient also has suspicious calcifications in the medial right breast which have not been biopsied.  NEEDLE LOCALIZATION WITH  MAMMOGRAPHIC GUIDANCE AND SPECIMEN RADIOGRAPH  The patient presents for needle localization prior to right lumpectomy.  I met with the patient and we discussed the procedure of needle localization including risks.  Specifically, we discussed the risks of bleeding and infection.  Informed written consent was given.  Using mammographic guidance, sterile technique, local anesthesia and two 9 cm localization needles, the previously placed cylinder shaped biopsy marker clip and cluster of suspicious microcalcifications in the medial right breast were localized using amedial approach.  The images were marked for Dr. Carolynne Edouard.  Specimen radiograph was performed at Day Surgery, and demonstrates the wire tips, biopsy marker clip and suspicious calcifications present in the tissue sample.  The specimen was marked for pathology.  IMPRESSION: Needle localization right breast.  No apparent complications.   Original Report Authenticated By: Beckie Salts, M.D.    Procedures Breast lumpectomy with needle localization and axillary sentinel lymph node biopsy  Hospital Course:  [name] is a 72 year old female with a history significant for  Recently diagnosed right breast cancer, DVT/PEwho underwent the procedure listed above.  Tolerated procedure well and was transferred to the floor.  Diet was advanced as tolerated.  On POD #1, the patient was voiding well, tolerating diet, ambulating well, pain well controlled, vital signs stable, incision c/d/i and felt stable for discharge home. She was given a prescription for tramadol as she declined norco.  She may take tylenol PRN, but i advised her to avoid NSAIDs since she has resumed the coumadin and will be starting lovenox per her NP/MD. Patient will follow up in our office in 2 weeks and knows  to call with questions or concerns particularly bleeding, bruising or worsening pain.  Physical Exam: General:  Alert, NAD, pleasant, comfortable Right axilla: incision with minimal  bruising, edges are approximated, dermabond in place.  No erythema.    Medication List         Calcium-Vitamin D 600-200 MG-UNIT Caps  Take 1 tablet by mouth daily.     diclofenac sodium 1 % Gel  Commonly known as:  VOLTAREN  Apply 4 g topically 3 (three) times daily.     furosemide 20 MG tablet  Commonly known as:  LASIX  Take 20 mg by mouth daily as needed for fluid.     glucosamine-chondroitin 500-400 MG tablet  Take 1 tablet by mouth daily.     insulin glargine 100 UNIT/ML injection  Commonly known as:  LANTUS  Inject 40 Units into the skin at bedtime.     insulin regular 100 units/mL injection  Commonly known as:  NOVOLIN R,HUMULIN R  Inject into the skin 3 (three) times daily before meals. Sliding scale 20-32 units     lisinopril 40 MG tablet  Commonly known as:  PRINIVIL,ZESTRIL  Take 40 mg by mouth daily.     LOVENOX IJ  Inject 130 Units as directed daily.     niacin 500 MG tablet  Take 500 mg by mouth daily.     SUPER B COMPLEX PO  Take 1 tablet by mouth daily.     traMADol 50 MG tablet  Commonly known as:  ULTRAM  Take 1 tablet (50 mg total) by mouth every 8 (eight) hours as needed for pain.     warfarin 5 MG tablet  Commonly known as:  COUMADIN  Take 5-7.5 mg by mouth daily. Take 7.5mg  on Monday thru Friday, and 5mg t on Saturday and Sunday.             Follow-up Information   Follow up with [name], MD In 2 weeks.   Contact information:   [address] [place] [address] [phone]       Signed: Ashok Norris, [facility] [phone]  06/18/2013, 8:06 AM

## 2013-06-20 ENCOUNTER — Telehealth (INDEPENDENT_AMBULATORY_CARE_PROVIDER_SITE_OTHER): Payer: Self-pay | Admitting: General Surgery

## 2013-06-20 NOTE — Telephone Encounter (Signed)
LMOM letting pt know she has a PO appt w/ Dr. Carolynne Edouard on 07/01/13 at 1:30 w/ arrival time on 1:15

## 2013-06-20 NOTE — Anesthesia Postprocedure Evaluation (Signed)
  Anesthesia Post-op Note  Patient: [name]  Procedure(s) Performed: Procedure(s): BREAST LUMPECTOMY WITH NEEDLE LOCALIZATION AND AXILLARY SENTINEL LYMPH NODE BX (Right)  Patient Location: PACU  Anesthesia Type:General  Level of Consciousness: awake  Airway and Oxygen Therapy: Patient Spontanous Breathing  Post-op Pain: mild  Post-op Assessment: Post-op Vital signs reviewed  Post-op Vital Signs: stable  Complications: No apparent anesthesia complications

## 2013-06-21 ENCOUNTER — Encounter (HOSPITAL_COMMUNITY): Payer: Self-pay | Admitting: General Surgery

## 2013-06-21 LAB — POCT I-STAT GLUCOSE: Glucose, Bld: 293 mg/dL — ABNORMAL HIGH (ref 70–99)

## 2013-06-22 ENCOUNTER — Ambulatory Visit (INDEPENDENT_AMBULATORY_CARE_PROVIDER_SITE_OTHER): Payer: Medicare Other | Admitting: Family

## 2013-06-22 ENCOUNTER — Encounter: Payer: Self-pay | Admitting: Family

## 2013-06-22 VITALS — BP 120/70 | HR 72 | Temp 98.9°F | Resp 16 | Wt 201.0 lb

## 2013-06-22 DIAGNOSIS — Z9889 Other specified postprocedural states: Secondary | ICD-10-CM

## 2013-06-22 NOTE — Patient Instructions (Addendum)
Please keep your upcoming appointment with Dr. Carolynne Edouard. Call if you develop fever >100, increased pain, swelling or redness of the right breast.

## 2013-06-22 NOTE — Progress Notes (Signed)
Subjective:    Patient ID: [name], female    DOB: 1941-10-11, 72 y.o.   MRN: [id number]  HPI  [name] is a 72 yr old female who presents today with chief complaint of pain beneath her right arm.  She underwent right breast lumpectomy on 8/1 and pathology revealed ductal carcinoma insitu in one specimen and invasive ductal carcinoma in a second specimen.  Margins were clear and Lymph notes were negative.  She is concerned about some redness on the lower portion of her right breast and around the upper incision of her right breast.    Review of Systems    see HPI  Past Medical History  Diagnosis Date  . Hyperplastic colon polyp   . Diabetes mellitus type II   . Osteoarthritis   . GERD (gastroesophageal reflux disease)   . Osteopenia   . Lupus     of skin see dermatologist frequently  . OA (osteoarthritis of spine)     C-spine  . DVT (deep venous thrombosis)     right leg DVT 05-17-2008  . Leg swelling     left leg 2-10, u/s showed a old clot, has a hypercoag panel,  . History of cardiovascular stress test     10/09 had CP, stress test neg, likely GI (GERD)-09/2006-had CP: stress test (-)  . 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 DVT (deep vein thrombosis) in pregnancy   . DVT of leg (deep venous thrombosis) 07/28/2011  . 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 Allergy   . Clotting disorder     hx of PE and DVT  . C. difficile diarrhea     hx of,   . 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 Skin cancer     skin CA  . Left leg pain 03/12/2013  . Otitis externa 03/19/2013    right  . PONV (postoperative nausea and vomiting)   . Hypertension     pcp  dr Lendell Caprice   h.p   lebaur    History   Social History  . Marital Status: Widowed    Spouse Name: N/A    Number of Children: 3  . Years of Education: N/A   Occupational History  . Retired    Social History Main Topics  .  Smoking status: Never Smoker   . Smokeless tobacco: Never Used  . Alcohol Use: No  . Drug Use: No  . Sexually Active: Not Currently   Other Topics Concern  . Not on file   Social History Narrative   Single-widow    3 children , 1 in [place] (son, daughter - [name])    [place] to [place] from [place], [place]   Alcohol Use - no      tobacco-- never     Illicit Drug Use - no    Pt is Jehovah's Witness-No blood products   Daughter - [name]           Past Surgical History  Procedure Laterality Date  . Cholecystectomy    . Breast biopsy      left breast and right breast  . Breast lumpectomy      left breast-ductal carcinoma in situ  . Appendectomy    . Skin cancer excision      from back   . Breast lumpectomy with needle localization and axillary sentinel  lymph node bx Right 06/17/2013    Procedure: BREAST LUMPECTOMY WITH NEEDLE LOCALIZATION AND AXILLARY SENTINEL LYMPH NODE BX;  Surgeon: [name], MD;  Location: [facility] OR;  Service: General;  Laterality: Right;    Family History  Problem Relation Age of Onset  . Diabetes Mother   . Diabetes    . Heart attack Neg Hx   . Colon cancer Neg Hx   . Breast cancer Neg Hx   . Esophageal cancer Neg Hx   . Rectal cancer Neg Hx   . Stomach cancer Neg Hx     Allergies  Allergen Reactions  . Fluconazole Other (See Comments)    REACTION: severe fatigue and muscle weakness  . Metformin Diarrhea  . Pioglitazone Other (See Comments)    REACTION: wt gain  . Vancomycin     Erythematous rash, hands and toes became cyanotic.      Current Outpatient Prescriptions on File Prior to Visit  Medication Sig Dispense Refill  . B Complex-C (SUPER B COMPLEX PO) Take 1 tablet by mouth daily.      . Calcium Carbonate-Vitamin D (CALCIUM-VITAMIN D) 600-200 MG-UNIT CAPS Take 1 tablet by mouth daily.       . diclofenac sodium (VOLTAREN) 1 % GEL Apply 4 g topically 3 (three) times daily.      . Enoxaparin Sodium (LOVENOX IJ) Inject 130 Units  as directed daily.       . furosemide (LASIX) 20 MG tablet Take 20 mg by mouth daily as needed for fluid.      Marland Kitchen glucosamine-chondroitin 500-400 MG tablet Take 1 tablet by mouth daily.       . insulin glargine (LANTUS) 100 UNIT/ML injection Inject 40 Units into the skin at bedtime.      . insulin regular (NOVOLIN R,HUMULIN R) 100 units/mL injection Inject into the skin 3 (three) times daily before meals. Sliding scale 20-32 units      . lisinopril (PRINIVIL,ZESTRIL) 40 MG tablet Take 40 mg by mouth daily.      . niacin 500 MG tablet Take 500 mg by mouth daily.       . traMADol (ULTRAM) 50 MG tablet Take 1 tablet (50 mg total) by mouth every 8 (eight) hours as needed for pain.  30 tablet  1  . warfarin (COUMADIN) 5 MG tablet Take 5-7.5 mg by mouth daily. Take 7.5mg  on Monday thru Friday, and 5mg t on Saturday and Sunday.       No current facility-administered medications on file prior to visit.    BP 120/70  Pulse 72  Temp(Src) 98.9 F (37.2 C) (Oral)  Resp 16  Wt 201 lb (91.173 kg)  BMI 31.47 kg/m2  SpO2 99%    Objective:   Physical Exam  Constitutional: She appears well-developed and well-nourished. No distress.  Cardiovascular: Normal rate and regular rhythm.   No murmur heard. Pulmonary/Chest: Effort normal and breath sounds normal. No respiratory distress. She has no wheezes. She has no rales. She exhibits no tenderness.  Genitourinary:  2 incisions right breast, clean, dry and well approximated.   One incision is in the right upper outer quadrant and on is beneath that on the right breast.  There is some swelling of the right breast and some depended ecchymosis noted on the right breast.    Psychiatric: She has a normal mood and affect. Her behavior is normal. Judgment and thought content normal.          Assessment & Plan:

## 2013-06-23 ENCOUNTER — Telehealth (INDEPENDENT_AMBULATORY_CARE_PROVIDER_SITE_OTHER): Payer: Self-pay

## 2013-06-23 NOTE — Telephone Encounter (Signed)
LMOM. Path has clear margins and negative nodes.  

## 2013-06-27 DIAGNOSIS — Z9889 Other specified postprocedural states: Secondary | ICD-10-CM | POA: Insufficient documentation

## 2013-06-27 NOTE — Assessment & Plan Note (Signed)
Normal post operative appearance of the right breast.  No sign of infection at this time. Reassurance provided.  She is instructed to monitor the area for increased pain/swelling, redness and to monitor for fever. She is instructed to contact her surgeon if any of these things occur. Otherwise, I have asked her to keep her upcoming appointment with her surgeon. She has rx for tramadol to use prn pain.

## 2013-07-01 ENCOUNTER — Encounter (INDEPENDENT_AMBULATORY_CARE_PROVIDER_SITE_OTHER): Payer: Self-pay | Admitting: General Surgery

## 2013-07-01 ENCOUNTER — Encounter (INDEPENDENT_AMBULATORY_CARE_PROVIDER_SITE_OTHER): Payer: Self-pay

## 2013-07-01 ENCOUNTER — Ambulatory Visit (INDEPENDENT_AMBULATORY_CARE_PROVIDER_SITE_OTHER): Payer: Medicare Other | Admitting: General Surgery

## 2013-07-01 VITALS — BP 150/80 | HR 76 | Resp 16 | Ht 67.0 in | Wt 206.2 lb

## 2013-07-01 DIAGNOSIS — I878 Other specified disorders of veins: Secondary | ICD-10-CM | POA: Insufficient documentation

## 2013-07-01 DIAGNOSIS — I872 Venous insufficiency (chronic) (peripheral): Secondary | ICD-10-CM

## 2013-07-01 DIAGNOSIS — C50919 Malignant neoplasm of unspecified site of unspecified female breast: Secondary | ICD-10-CM

## 2013-07-01 DIAGNOSIS — C50911 Malignant neoplasm of unspecified site of right female breast: Secondary | ICD-10-CM

## 2013-07-01 NOTE — Patient Instructions (Signed)
Will refer to medical and radiation oncology as well as vascular surgery for venous stasis problems

## 2013-07-01 NOTE — Progress Notes (Signed)
Subjective:     Patient ID: [name], female   DOB: 11/28/1940, 72 y.o.   MRN: [id number]  HPI The patient is a 72 year old white female who is 2 weeks status post right lumpectomy and negative sentinel node biopsy for a T1 A. N0 right breast cancer. She has some soreness and sensitivity of the right breast otherwise seems to be healing well. Her pathology showed a 4 mm area of invasive cancer with negative nodes. She was ER and PR positive and HER-2/neu negative.  Review of Systems     Objective:   Physical Exam On exam her right breast and axillary incisions are healing nicely with no sign of infection. She does seem to have an inflammatory reaction centered around her nipple and the areola which is likely secondary to the nuclear medicine injection.     Assessment:     The patient is 2 weeks status post right lumpectomy for breast cancer     Plan:     At this point we will refer her to medical and radiation oncology. I will plan to see her back in about 3 months.

## 2013-07-04 ENCOUNTER — Encounter (INDEPENDENT_AMBULATORY_CARE_PROVIDER_SITE_OTHER): Payer: Self-pay

## 2013-07-06 ENCOUNTER — Telehealth: Payer: Self-pay | Admitting: *Deleted

## 2013-07-06 NOTE — Telephone Encounter (Addendum)
Spoke to pt concerning appt with med onc.  Pt request to see med onc at [facility].  Pt scheduled to see Dr. Welton Flakes on 07/12/13 at 1:15.  Confirmed appt date and time.  Mailed letter and packet.  Gave pt instructions and contact information.

## 2013-07-10 ENCOUNTER — Encounter: Payer: Self-pay | Admitting: Radiation Oncology

## 2013-07-10 DIAGNOSIS — D0590 Unspecified type of carcinoma in situ of unspecified breast: Secondary | ICD-10-CM | POA: Insufficient documentation

## 2013-07-10 NOTE — Progress Notes (Signed)
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 female who received radiotherapy by me in 2004 for Left breast DCIS.  During routine followup screening mammogram on 08/23/2012, patient was noted to have calcifications in the right breast. Magnification views confirmed the presence of a new cluster of linear calcifications in the medial aspect of the right breast posteriorly. In addition, small clustered punctate calcifications were also identified in the right upper inner quadrant posteriorly. At that time, the radiologist suggested a staged stereotactic core needle biopsy to sample the new linear calcifications first since they appeared to be most suspicious. The patient proceeded to stereotactic biopsy on 09/29/2012. Final pathology showed vascular calcifications with no malignancy. However, the second cluster of round calcifications were not biopsied at that time. The patient had significant discomfort related to her stereotactically guided core needle biopsy and felt that she would be unable to tolerate a second biopsy. Based on this, a followup diagnostic mammogram was suggested in 6 months. The patient proceeded to diagnostic right-sided mammogram with magnification views and ultrasound on 04/22/2013. This study showed an increase in the dystrophic type calcifications now measuring up to 6 mm. The simultaneous ultrasound confirmed a small hypoechoic mass measuring 1 cm in this area while the axilla was negative. The patient refused to consider stereotactic guided biopsy. Ultrasound-guided core  needle biopsy was performed on June 19 revealing ductal carcinoma which was 96% ER positive and 83% PR positive.  Bilateral breast MRIs were performed on 05/10/2013. The MRI showed a new area of concern within the right breast and needle core biopsy on July 3 revealed benign breast tissue  On 06/17/2013, the patient proceeded to lumpectomy with sentinel lymph node sampling. Within the lumpectomy specimen, 2 areas were identified. The lateral lesion area contained a 1.6 cm lesion with foci of intermediate grade in situ ductal carcinoma with a 2 mm lateral margin. The medial lesion contained a 4 mm focus of invasive ductal carcinoma approaching to within 3 mm of the closest margin. 2 sentinel lymph nodes were identified and both were negative for tumor involvement.   The patient has, been referred today to discuss her new right-sided breast cancer and discuss potential radiation treatment options.Marland Kitchen  PREVIOUS RADIATION THERAPY: Yes Left breast to 50 Gy and 10 Gy electron boost 3-4/04  PAST MEDICAL HISTORY:  has a past medical history of Hyperplastic colon polyp; Diabetes mellitus type II; Osteoarthritis; GERD (gastroesophageal reflux disease); Osteopenia; Lupus; OA (osteoarthritis of spine); DVT (deep venous thrombosis); Leg swelling; History of cardiovascular stress test; Melanoma in situ of back (06/23/2011); DVT (deep vein thrombosis) in pregnancy; DVT of leg (deep venous thrombosis) (07/28/2011); 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 difficile diarrhea; Skin cancer; Left leg pain (03/12/2013); Otitis externa (03/19/2013); PONV (postoperative nausea and vomiting); Hypertension; and Breast cancer.    ETIOLOGIC FACTORS:  The patient experienced menarche at age 82, first childbirth at age 30, menopause at age 63.  She had three live births and unfortunately, one of her children died after six hours.  She never used birth control pills or  hormone replacement therapy.     PAST SURGICAL HISTORY: Past Surgical History  Procedure Laterality Date  . Cholecystectomy    . Breast biopsy      left breast and right breast  . Breast lumpectomy      left breast-ductal carcinoma in situ  . Appendectomy    . Skin cancer excision      from back   . Breast lumpectomy with needle localization and axillary sentinel lymph node bx Right 06/17/2013    Procedure: BREAST LUMPECTOMY WITH NEEDLE LOCALIZATION AND AXILLARY SENTINEL LYMPH NODE BX;  Surgeon: Kelsey Askew, MD;  Location: [facility] OR;  Service: General;  Laterality: Right;    FAMILY HISTORY: family history includes Cancer in her father; Diabetes in her mother and another family member. There is no history of Heart attack, Colon cancer, Breast cancer, Esophageal cancer, Rectal cancer, or Stomach cancer.  SOCIAL HISTORY:  [name] presents today with her husband [name]. They moved from [place] to live with their daughter, [name], in [place], to help raise the single mother's three children.  She denies any tobacco or alcohol use.  reports that she has never smoked. She has never used smokeless tobacco. She reports that she does not drink alcohol or use illicit drugs.  ALLERGIES: Fluconazole; Metformin; Pioglitazone; and Vancomycin  MEDICATIONS:  Current Outpatient Prescriptions  Medication Sig Dispense Refill  . B Complex-C (SUPER B COMPLEX PO) Take 1 tablet by mouth daily.      . Calcium Carbonate-Vitamin D (CALCIUM-VITAMIN D) 600-200 MG-UNIT CAPS Take 1 tablet by mouth daily.       . diclofenac sodium (VOLTAREN) 1 % GEL Apply 4 g topically 3 (three) times daily.      . Enoxaparin Sodium (LOVENOX IJ) Inject 130 Units as directed daily.       . furosemide (LASIX) 20 MG tablet Take 20 mg by mouth daily as needed for fluid.      Marland Kitchen glucosamine-chondroitin 500-400 MG tablet Take 1 tablet by mouth daily.       . insulin glargine (LANTUS) 100 UNIT/ML injection Inject 40 Units into the skin at bedtime.       . insulin regular (NOVOLIN R,HUMULIN R) 100 units/mL injection Inject into the skin 3 (three) times daily before meals. Sliding scale 20-32 units      . lisinopril (PRINIVIL,ZESTRIL) 40 MG tablet Take 40 mg by mouth daily.      . niacin 500 MG tablet Take 500 mg by mouth daily.       . traMADol (ULTRAM) 50 MG tablet Take 1 tablet (50 mg total) by mouth every 8 (eight) hours as needed for pain.  30 tablet  1  . warfarin (COUMADIN) 5 MG tablet Take 5-7.5 mg by mouth daily. Take 7.5mg  on Monday thru Friday, and 5mg t on Saturday and Sunday.       No current facility-administered medications for this encounter.    REVIEW OF SYSTEMS:  A 15 point review of systems is documented in the electronic medical record. This was obtained by the nursing staff. However, I reviewed this with the patient to discuss relevant findings and make appropriate changes.  A comprehensive review of systems was negative. she does feel some heaviness in the right breast as well as some increased urination in the infirmary fold on bilateral breasts.   PHYSICAL EXAM: Vital signs were within normal range. The patient was in no acute chest today. She is alert oriented.  Per surgeryonstitutional: She is  oriented to person, place, and time. She appears well-developed and well-nourished.  HENT:  Head: Normocephalic and atraumatic.  Eyes: Conjunctivae and EOM are normal. Pupils are equal, round, and reactive to light.  Neck: Normal range of motion. Neck supple.  Cardiovascular: Normal rate, regular rhythm and normal heart sounds.  Pulmonary/Chest: Effort normal and breath sounds normal.  There is a large hematoma central and inferior in the right breast. Otherwise there is no other palpable mass in either breast. There is no palpable axillary, cervical, or supraclavicular lymphadenopathy  Abdominal: Soft. Bowel sounds are normal. She exhibits no mass. There is no tenderness.  Musculoskeletal: Normal range of motion.   Lymphadenopathy:  She has no cervical adenopathy.  Neurological: She is alert and oriented to person, place, and time.  Skin: Skin is warm and dry.  Psychiatric: She has a normal mood and affect. Her behavior is normal.  Currently, patient does have some peau d'orange changes along the right breast related to edema inflammation following biopsies. She appears to have some opportunistic yeast in bilateral inframammary folds. Otherwise, her healing is unremarkable and she appears to be ready to proceed with radiation treatment  KPS = 100  LABORATORY DATA:  Lab Results  Component Value Date   WBC 8.2 06/09/2013   HGB 12.4 06/09/2013   HCT 37.0 06/09/2013   MCV 83.3 06/09/2013   PLT 201 06/09/2013   Lab Results  Component Value Date   NA 140 06/09/2013   K 3.9 06/09/2013   CL 104 06/09/2013   CO2 26 06/09/2013   Lab Results  Component Value Date   ALT 30 02/27/2012   AST 25 02/27/2012   ALKPHOS 83 02/27/2012   BILITOT 0.5 02/27/2012     RADIOGRAPHY: Nm Sentinel Node Inj-no Rpt (breast)  06/17/2013   CLINICAL DATA: right breast cancer   Sulfur colloid was injected intradermally by the nuclear medicine  technologist for breast cancer sentinel node localization.    Mm Rt Plc Breast Loc Dev   1st Lesion  Inc Mammo Guide  06/17/2013   *RADIOLOGY REPORT*  Clinical Data:  *RADIOLOGY REPORT*  Clinical Data:  Recently diagnosed right breast ductal carcinoma in situ.  The patient also has suspicious calcifications in the medial right breast which have not been biopsied.  NEEDLE LOCALIZATION WITH MAMMOGRAPHIC GUIDANCE AND SPECIMEN RADIOGRAPH  The patient presents for needle localization prior to right lumpectomy.  I met with the patient and we discussed the procedure of needle localization including risks.  Specifically, we discussed the risks of bleeding and infection.  Informed written consent was given.  Using mammographic guidance, sterile technique, local anesthesia and two 9 cm localization needles,  the previously placed cylinder shaped biopsy marker clip and cluster of suspicious microcalcifications in the medial right breast were localized using amedial approach.  The images were marked for Dr. Carolynne Edouard.  Specimen radiograph was performed at [facility], and demonstrates the wire tips, biopsy marker clip and suspicious calcifications present in the tissue sample.  The specimen was marked for pathology.  IMPRESSION: Needle localization right breast.  No apparent complications.   Original Report Authenticated By: Beckie Salts, M.D.      IMPRESSION: This patient is a very nice 72 year old woman with stage T1a node-negative ER positive breast cancer of the right breast measuring 4 mm status post lumpectomy and sentinel lymph node sampling. She is a good candidate for breast conservation including radiotherapy. She previously tolerated a course of radiotherapy to the left breast quite well and  would likely do well with whole breast radiotherapy to the right side.  PLAN:Today, I talked to the patient and family about the findings and work-up thus far.  We discussed the natural history of disease and general treatment, highlighting the role or radiotherapy in the management.  We discussed the available radiation techniques, and focused on the details of logistics and delivery.  We reviewed the anticipated acute and late sequelae associated with radiation in this setting.  The patient was encouraged to ask questions that I answered to the best of my ability.  I filled out a patient counseling form during our discussion including treatment diagrams.  We retained a copy for our records.  The patient would like to proceed with radiation and will be scheduled for CT simulation.  I spent 60 minutes minutes face to face with the patient and more than 50% of that time was spent in counseling and/or coordination of care.   ------------------------------------------------  Artist Pais. Kathrynn Running, M.D.

## 2013-07-11 ENCOUNTER — Ambulatory Visit
Admission: RE | Admit: 2013-07-11 | Discharge: 2013-07-11 | Disposition: A | Discharge status: Home / Self Care | Payer: Medicare Other | Source: Ambulatory Visit | Attending: Radiation Oncology | Admitting: Radiation Oncology

## 2013-07-11 ENCOUNTER — Encounter: Payer: Self-pay | Admitting: Radiation Oncology

## 2013-07-11 ENCOUNTER — Telehealth: Payer: Self-pay | Admitting: *Deleted

## 2013-07-11 VITALS — BP 199/67 | HR 71 | Temp 98.0°F | Resp 16 | Ht 67.0 in | Wt 205.7 lb

## 2013-07-11 VITALS — BP 199/67 | HR 71 | Temp 98.0°F | Resp 16 | Ht 67.0 in | Wt 205.0 lb

## 2013-07-11 DIAGNOSIS — Z7901 Long term (current) use of anticoagulants: Secondary | ICD-10-CM | POA: Insufficient documentation

## 2013-07-11 DIAGNOSIS — Z923 Personal history of irradiation: Secondary | ICD-10-CM | POA: Insufficient documentation

## 2013-07-11 DIAGNOSIS — Z86718 Personal history of other venous thrombosis and embolism: Secondary | ICD-10-CM | POA: Insufficient documentation

## 2013-07-11 DIAGNOSIS — M899 Disorder of bone, unspecified: Secondary | ICD-10-CM | POA: Insufficient documentation

## 2013-07-11 DIAGNOSIS — Z794 Long term (current) use of insulin: Secondary | ICD-10-CM | POA: Insufficient documentation

## 2013-07-11 DIAGNOSIS — Z853 Personal history of malignant neoplasm of breast: Secondary | ICD-10-CM

## 2013-07-11 DIAGNOSIS — C50211 Malignant neoplasm of upper-inner quadrant of right female breast: Secondary | ICD-10-CM

## 2013-07-11 DIAGNOSIS — K219 Gastro-esophageal reflux disease without esophagitis: Secondary | ICD-10-CM | POA: Insufficient documentation

## 2013-07-11 DIAGNOSIS — Z170 Estrogen receptor positive status [ER+]: Secondary | ICD-10-CM | POA: Insufficient documentation

## 2013-07-11 DIAGNOSIS — E119 Type 2 diabetes mellitus without complications: Secondary | ICD-10-CM | POA: Insufficient documentation

## 2013-07-11 DIAGNOSIS — Z86711 Personal history of pulmonary embolism: Secondary | ICD-10-CM | POA: Insufficient documentation

## 2013-07-11 DIAGNOSIS — C50919 Malignant neoplasm of unspecified site of unspecified female breast: Secondary | ICD-10-CM | POA: Insufficient documentation

## 2013-07-11 NOTE — Progress Notes (Signed)
Patient report "under my breast is hot." Reports right breast incision is healed and well approximated without redness, drainage or edema. Patient reports right breast feels very heavy and full. Denies nipple discharge. ROM of right upper arm limited but, "getting better." Denies pain or swelling in right arm/hand. Denies nausea, vomiting, headache, dizziness, diarrhea, night sweats or unintentional weight loss.

## 2013-07-11 NOTE — Telephone Encounter (Signed)
Pt called to cancel appt d/t she wants to r/s after she is finished with radiation.  Encourage pt to keep appt.  Pt relate she will r/s after radiation.  Gave pt contact information.

## 2013-07-11 NOTE — Progress Notes (Signed)
See progress note under physician encounter. 

## 2013-07-11 NOTE — Addendum Note (Signed)
Encounter addended by: Agnes Lawrence, RN on: 07/11/2013  1:47 PM<BR>     Documentation filed: Chief Complaint Section, Charges VN, Inpatient Document Flowsheet, Inpatient Patient Education, Vitals Section

## 2013-07-11 NOTE — Progress Notes (Signed)
Location of Breast Cancer:1 cm invasive ductal carcinoma of central right breast   Histology per Pathology Report:     Receptor Status: ER(+), PR (+), Her2-neu (-)  Did patient present with symptoms (if so, please note symptoms) or was this found on screening mammography?: referred by Dr. Imogene Burn to Dr. Carolynne Edouard on 05/10/2013 following routine screening mammogram  Past/Anticipated interventions by surgeon, if WUJ:WJXBJ breast lumpectomy with needle localization and axillary sentinel lymph node biopsy   Past/Anticipated interventions by medical oncology, if any: scheduled to see Welton Flakes 07/12/2013  Lymphedema issues, if any:  None noted   Pain issues, if any:  None noted   SAFETY ISSUES:  Prior radiation? yes, 01/19/2003-03/02/2003; entire left breast 50 Gy in 25 fractions, plus boost to surgical cavity to 60 Gy with 5 additions fractions at 2 Gy  Pacemaker/ICD? no  Possible current pregnancy?no  Is the patient on methotrexate? no  Current Complaints / other details:  72 year old female.     Ardell Isaacs, RN 07/11/2013,10:08 AM

## 2013-07-12 ENCOUNTER — Other Ambulatory Visit: Payer: Medicare Other | Admitting: Lab

## 2013-07-12 ENCOUNTER — Ambulatory Visit: Payer: Medicare Other | Admitting: Oncology

## 2013-07-12 ENCOUNTER — Other Ambulatory Visit: Payer: Self-pay | Admitting: Emergency Medicine

## 2013-07-12 ENCOUNTER — Ambulatory Visit: Payer: Medicare Other

## 2013-07-12 NOTE — Progress Notes (Signed)
Provided patient with Miconazole powder as ordered by Dr. Kathrynn Running and directed upon use. Patient verbalized understanding.

## 2013-07-12 NOTE — Addendum Note (Signed)
Encounter addended by: Agnes Lawrence, RN on: 07/12/2013 10:54 AM<BR>     Documentation filed: Notes Section

## 2013-07-19 ENCOUNTER — Ambulatory Visit (INDEPENDENT_AMBULATORY_CARE_PROVIDER_SITE_OTHER): Payer: Medicare Other | Admitting: Family

## 2013-07-19 ENCOUNTER — Encounter: Payer: Self-pay | Admitting: Family

## 2013-07-19 ENCOUNTER — Other Ambulatory Visit: Payer: Self-pay

## 2013-07-19 VITALS — BP 130/50 | HR 73 | Temp 98.2°F | Wt 204.0 lb

## 2013-07-19 DIAGNOSIS — Z23 Encounter for immunization: Secondary | ICD-10-CM

## 2013-07-19 DIAGNOSIS — I872 Venous insufficiency (chronic) (peripheral): Secondary | ICD-10-CM

## 2013-07-19 DIAGNOSIS — C50211 Malignant neoplasm of upper-inner quadrant of right female breast: Secondary | ICD-10-CM

## 2013-07-19 DIAGNOSIS — C50219 Malignant neoplasm of upper-inner quadrant of unspecified female breast: Secondary | ICD-10-CM

## 2013-07-19 DIAGNOSIS — IMO0001 Reserved for inherently not codable concepts without codable children: Secondary | ICD-10-CM

## 2013-07-19 DIAGNOSIS — E785 Hyperlipidemia, unspecified: Secondary | ICD-10-CM

## 2013-07-19 DIAGNOSIS — I10 Essential (primary) hypertension: Secondary | ICD-10-CM

## 2013-07-19 NOTE — Assessment & Plan Note (Addendum)
BP stable on current meds.  Monitor. Advised her to use lasix on a PRN basis for LE edema.

## 2013-07-19 NOTE — Assessment & Plan Note (Addendum)
Clinically stable. Obtain A1C, urine microalbumin. Flu shot today.

## 2013-07-19 NOTE — Patient Instructions (Addendum)
Please return fasting to the lab this week. Follow up in 3 months, sooner if problems/concerns.

## 2013-07-19 NOTE — Assessment & Plan Note (Signed)
She is schedule to start radiotherapy with Dr. Kathrynn Running.

## 2013-07-19 NOTE — Progress Notes (Signed)
Subjective:    Patient ID: [name], female    DOB: Mar 07, 1941, 72 y.o.   MRN: [id number]  HPI  1) Breast cancer- saw Dr. Carolynne Edouard last 2 weeks ago.  Reports mild tenderness right breast.  She will be starting radiation this Friday.   2) HTN-  Reports + compliance with lisinopril. Denies CP/COB or cough. She has some bilateral LE swelling L>R. Reports that she stopped furosemide due to episodes or urinary incontinence.   3) DM2- she reports that she has been checking sugar 4 times a day. She is not fasting today. She has not had a flu shot today. Reports that her sugars were elevated earlier this month.  Reports that she has annual eye exam is up to date.    Review of Systems See HPI  Past Medical History  Diagnosis Date  . Hyperplastic colon polyp   . Diabetes mellitus type II   . Osteoarthritis   . GERD (gastroesophageal reflux disease)   . Osteopenia   . Lupus     of skin see dermatologist frequently  . OA (osteoarthritis of spine)     C-spine  . DVT (deep venous thrombosis)     right leg DVT 05-17-2008  . Leg swelling     left leg 2-10, u/s showed a old clot, has a hypercoag panel,  . History of cardiovascular stress test     10/09 had CP, stress test neg, likely GI (GERD)-09/2006-had CP: stress test (-)  . 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 DVT (deep vein thrombosis) in pregnancy   . DVT of leg (deep venous thrombosis) 07/28/2011  . 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 Allergy   . Clotting disorder     hx of PE and DVT  . C. difficile diarrhea     hx of,   . Skin cancer     skin CA  . Left leg pain 03/12/2013  . Otitis externa 03/19/2013    right  . PONV (postoperative nausea and vomiting)   . Hypertension     pcp  dr Lendell Caprice   h.p   lebaur  . 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 Marital Status: Widowed    Spouse  Name: N/A    Number of Children: 3  . Years of Education: N/A   Occupational History  . Retired    Social History Main Topics  . Smoking status: Never Smoker   . Smokeless tobacco: Never Used  . Alcohol Use: No  . Drug Use: No  . Sexual Activity: Not Currently   Other Topics Concern  . Not on file   Social History Narrative   Single-widow    3 children , 1 in [place] (son, daughter - [place])    [place] from [place], [place]   Alcohol Use - no      tobacco-- never     Illicit Drug Use - no    Pt is Jehovah's Witness-No blood products   Daughter - Tanisa Lagace           Past Surgical History  Procedure Laterality Date  . Cholecystectomy    . Breast biopsy      left breast and right breast  . Breast lumpectomy      left breast-ductal carcinoma  in situ  . Appendectomy    . Skin cancer excision      from back   . Breast lumpectomy with needle localization and axillary sentinel lymph node bx Right 06/17/2013    Procedure: BREAST LUMPECTOMY WITH NEEDLE LOCALIZATION AND AXILLARY SENTINEL LYMPH NODE BX;  Surgeon: [name], MD;  Location: [facility] OR;  Service: General;  Laterality: Right;    Family History  Problem Relation Age of Onset  . Diabetes Mother   . Diabetes    . Heart attack Neg Hx   . Colon cancer Neg Hx   . Breast cancer Neg Hx   . Esophageal cancer Neg Hx   . Rectal cancer Neg Hx   . Stomach cancer Neg Hx   . Cancer Father     melanoma    Allergies  Allergen Reactions  . Fluconazole Other (See Comments)    REACTION: severe fatigue and muscle weakness  . Metformin Diarrhea  . Pioglitazone Other (See Comments)    REACTION: wt gain  . Vancomycin     Erythematous rash, hands and toes became cyanotic.      Current Outpatient Prescriptions on File Prior to Visit  Medication Sig Dispense Refill  . B Complex-C (SUPER B COMPLEX PO) Take 1 tablet by mouth daily.      . Calcium Carbonate-Vitamin D (CALCIUM-VITAMIN D) 600-200 MG-UNIT CAPS Take  1 tablet by mouth daily.       . diclofenac sodium (VOLTAREN) 1 % GEL Apply 4 g topically 3 (three) times daily.      Marland Kitchen glucosamine-chondroitin 500-400 MG tablet Take 1 tablet by mouth daily.       . insulin glargine (LANTUS) 100 UNIT/ML injection Inject 40 Units into the skin at bedtime.      . insulin regular (NOVOLIN R,HUMULIN R) 100 units/mL injection Inject into the skin 3 (three) times daily before meals. Sliding scale 20-32 units      . lisinopril (PRINIVIL,ZESTRIL) 40 MG tablet Take 40 mg by mouth daily.      . miconazole (MICOTIN) 2 % powder Apply topically as needed for itching.      . niacin 500 MG tablet Take 500 mg by mouth daily.       Marland Kitchen warfarin (COUMADIN) 5 MG tablet Take 5-7.5 mg by mouth daily. Take 7.5mg  on Monday thru Friday, and 5mg t on Saturday and Sunday.      . Enoxaparin Sodium (LOVENOX IJ) Inject 130 Units as directed daily.       . furosemide (LASIX) 20 MG tablet Take 20 mg by mouth daily as needed for fluid.      . traMADol (ULTRAM) 50 MG tablet Take 1 tablet (50 mg total) by mouth every 8 (eight) hours as needed for pain.  30 tablet  1   No current facility-administered medications on file prior to visit.    BP 130/50  Pulse 73  Temp(Src) 98.2 F (36.8 C) (Oral)  Wt 204 lb (92.534 kg)  BMI 31.94 kg/m2  SpO2 99%       Objective:   Physical Exam  Constitutional: She is oriented to person, place, and time. She appears well-nourished. No distress.  HENT:  Head: Normocephalic and atraumatic.  Cardiovascular: Normal rate and regular rhythm.   No murmur heard. Pulmonary/Chest: Effort normal and breath sounds normal. No respiratory distress. She has no wheezes. She has no rales. She exhibits no tenderness.  Musculoskeletal: She exhibits no edema.  Neurological: She is alert and oriented to  person, place, and time.  Skin: Skin is warm and dry.  Psychiatric: She has a normal mood and affect. Her behavior is normal. Judgment and thought content normal.           Assessment & Plan:

## 2013-07-21 ENCOUNTER — Telehealth: Payer: Self-pay | Admitting: *Deleted

## 2013-07-21 NOTE — Telephone Encounter (Signed)
Pt returned my call and stated that she does not want a medical oncology appt.  She was scheduled w/ Dr. Welton Flakes and does not want to see her.  She states that she has already made Dr. Kathrynn Running aware of this, so I told her that I would inform Dr. Carolynne Edouard at  [facility] so he is aware also.  Emailed Music therapist and Dr. Carolynne Edouard at [facility] to make them aware.

## 2013-07-21 NOTE — Telephone Encounter (Signed)
Left message for pt to return my call so I can schedule a med onc appt. 

## 2013-07-22 ENCOUNTER — Telehealth: Payer: Self-pay | Admitting: Radiation Oncology

## 2013-07-22 ENCOUNTER — Ambulatory Visit
Admission: RE | Admit: 2013-07-22 | Discharge: 2013-07-22 | Disposition: A | Discharge status: Home / Self Care | Payer: Medicare Other | Source: Ambulatory Visit | Attending: Radiation Oncology | Admitting: Radiation Oncology

## 2013-07-22 ENCOUNTER — Other Ambulatory Visit: Payer: Self-pay | Admitting: Radiation Oncology

## 2013-07-22 DIAGNOSIS — C50919 Malignant neoplasm of unspecified site of unspecified female breast: Secondary | ICD-10-CM | POA: Insufficient documentation

## 2013-07-22 DIAGNOSIS — Z170 Estrogen receptor positive status [ER+]: Secondary | ICD-10-CM | POA: Insufficient documentation

## 2013-07-22 DIAGNOSIS — R229 Localized swelling, mass and lump, unspecified: Secondary | ICD-10-CM | POA: Insufficient documentation

## 2013-07-22 DIAGNOSIS — C50211 Malignant neoplasm of upper-inner quadrant of right female breast: Secondary | ICD-10-CM

## 2013-07-22 DIAGNOSIS — Z510 Encounter for antineoplastic radiation therapy: Secondary | ICD-10-CM | POA: Insufficient documentation

## 2013-07-22 DIAGNOSIS — Y842 Radiological procedure and radiotherapy as the cause of abnormal reaction of the patient, or of later complication, without mention of misadventure at the time of the procedure: Secondary | ICD-10-CM | POA: Insufficient documentation

## 2013-07-22 DIAGNOSIS — L589 Radiodermatitis, unspecified: Secondary | ICD-10-CM | POA: Insufficient documentation

## 2013-07-22 LAB — BASIC METABOLIC PANEL
CO2: 26 mEq/L (ref 19–32)
Calcium: 9.1 mg/dL (ref 8.4–10.5)
Chloride: 105 mEq/L (ref 96–112)
Glucose, Bld: 134 mg/dL — ABNORMAL HIGH (ref 70–99)
Potassium: 3.8 mEq/L (ref 3.5–5.3)
Sodium: 141 mEq/L (ref 135–145)

## 2013-07-22 LAB — HEPATIC FUNCTION PANEL
AST: 24 U/L (ref 0–37)
Albumin: 3.7 g/dL (ref 3.5–5.2)
Alkaline Phosphatase: 91 U/L (ref 39–117)
Indirect Bilirubin: 0.4 mg/dL (ref 0.0–0.9)
Total Protein: 6.4 g/dL (ref 6.0–8.3)

## 2013-07-22 LAB — LIPID PANEL
HDL: 31 mg/dL — ABNORMAL LOW (ref >39)
LDL Cholesterol: 96 mg/dL (ref 0–99)
Triglycerides: 189 mg/dL — ABNORMAL HIGH (ref <150)

## 2013-07-22 NOTE — Telephone Encounter (Signed)
Faxed note confirming [name] was present for her mother's appointment today. Fax confirmation of delivery obtained.

## 2013-07-22 NOTE — Progress Notes (Signed)
  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  Identity was confirmed.  All relevant records and images related to the planned course of therapy were reviewed.  The patient freely provided informed written consent to proceed with treatment after reviewing the details related to the planned course of therapy. The consent form was witnessed and verified by the simulation staff.  Then, the patient was set-up in a stable reproducible  supine position for radiation therapy.  CT images were obtained.  Surface markings were placed.  The CT images were loaded into the planning software.  Then the target and avoidance structures were contoured.  Treatment planning then occurred.  The radiation prescription was entered and confirmed.  Then, I designed and supervised the construction of a total of 3 medically necessary complex treatment devices, including a custom molded pillow for body positioning and 2 multileaf collimator apertures covering the whole breast, while shielding critical lungs and heart.  I have requested : Isodose Plan.    PLAN:  The patient will receive 50 Gy in 25 fractions to the whole breast followed by an electron boost to 60 Gy.  ________________________________  [name], M.D.

## 2013-07-23 ENCOUNTER — Telehealth: Payer: Self-pay | Admitting: Family

## 2013-07-23 LAB — MICROALBUMIN / CREATININE URINE RATIO
Creatinine, Urine: 139.5 mg/dL
Microalb Creat Ratio: 37.9 mg/g — ABNORMAL HIGH (ref 0.0–30.0)
Microalb, Ur: 5.29 mg/dL — ABNORMAL HIGH (ref 0.00–1.89)

## 2013-07-23 NOTE — Telephone Encounter (Signed)
Please call pt and let her know that her sugar is elevated. Increase lantus to 44 units and make sure she has follow up with endo. Triglycerides elevated, avoid concentrated sweets and white fluffy carbs.

## 2013-07-25 NOTE — Telephone Encounter (Signed)
Notified pt. Upcoming endo appt is 08/30/13. Notified pt. She requests that we fax recent lab results to Dr Allena Katz at [facility]. Results faxed.

## 2013-07-29 ENCOUNTER — Ambulatory Visit
Admission: RE | Admit: 2013-07-29 | Discharge: 2013-07-29 | Disposition: A | Discharge status: Home / Self Care | Payer: Medicare Other | Source: Ambulatory Visit | Attending: Radiation Oncology | Admitting: Radiation Oncology

## 2013-07-29 ENCOUNTER — Encounter: Payer: Self-pay | Admitting: Family

## 2013-07-29 DIAGNOSIS — D0592 Unspecified type of carcinoma in situ of left breast: Secondary | ICD-10-CM

## 2013-07-29 DIAGNOSIS — C50211 Malignant neoplasm of upper-inner quadrant of right female breast: Secondary | ICD-10-CM

## 2013-07-29 NOTE — Progress Notes (Signed)
  Radiation Oncology         (336) [phone] ________________________________  Name: ERINE PHENIX MRN: [id number]  Date: 07/29/2013  DOB: [birth year]  Simulation Verification Note  Status: outpatient  NARRATIVE: The patient was brought to the treatment unit and placed in the planned treatment position. The clinical setup was verified. Then port films were obtained and uploaded to the radiation oncology medical record software.  The treatment beams were carefully compared against the planned radiation fields. The position location and shape of the radiation fields was reviewed. They targeted volume of tissue appears to be appropriately covered by the radiation beams. Organs at risk appear to be excluded as planned.  Based on my personal review, I approved the simulation verification. The patient's treatment will proceed as planned.  ------------------------------------------------  Artist Pais Kathrynn Running, M.D.

## 2013-08-01 ENCOUNTER — Ambulatory Visit
Admission: RE | Admit: 2013-08-01 | Discharge: 2013-08-01 | Disposition: A | Discharge status: Home / Self Care | Payer: Medicare Other | Source: Ambulatory Visit | Attending: Radiation Oncology | Admitting: Radiation Oncology

## 2013-08-02 ENCOUNTER — Ambulatory Visit
Admission: RE | Admit: 2013-08-02 | Discharge: 2013-08-02 | Disposition: A | Discharge status: Home / Self Care | Payer: Medicare Other | Source: Ambulatory Visit | Attending: Radiation Oncology | Admitting: Radiation Oncology

## 2013-08-02 DIAGNOSIS — D0592 Unspecified type of carcinoma in situ of left breast: Secondary | ICD-10-CM

## 2013-08-02 DIAGNOSIS — C50211 Malignant neoplasm of upper-inner quadrant of right female breast: Secondary | ICD-10-CM

## 2013-08-02 DIAGNOSIS — B49 Unspecified mycosis: Secondary | ICD-10-CM

## 2013-08-02 DIAGNOSIS — D0359 Melanoma in situ of other part of trunk: Secondary | ICD-10-CM

## 2013-08-02 MED ORDER — MICONAZOLE NITRATE 2 % EX POWD: CUTANEOUS | Status: DC

## 2013-08-02 MED ORDER — RADIAPLEXRX EX GEL
Freq: Once | CUTANEOUS | Status: AC
  Administered 2013-08-02: 17:00:00 via TOPICAL

## 2013-08-02 MED ORDER — ALRA NON-METALLIC DEODORANT (RAD-ONC)
1.0000 "application " | Freq: Once | TOPICAL | Status: AC
  Administered 2013-08-02: 1 via TOPICAL

## 2013-08-02 NOTE — Progress Notes (Signed)
   Weekly Management Note:  outpatient Current Dose:  4 Gy  Projected Dose: 50 Gy initial  Narrative:  The patient presents for routine under treatment assessment.  CBCT/MVCT images/Port film x-rays were reviewed.  The chart was checked. Asked to be seen for rash under breast.  Has been there for weeks. Moist skin at IM folds.  Physical Findings:  vitals were not taken for this visit. Right breast - red rash, IM fold, not raised. Skin intact  Impression:  The patient is tolerating radiotherapy.  Plan:  Continue radiotherapy as planned. Miconazole powder Rx'd - suspect fungal infection.  Will see Dr Kathrynn Running in 3 days to reassess.  ________________________________   Lonie Peak, M.D.

## 2013-08-02 NOTE — Progress Notes (Signed)
[name] here for post sim education.  She was given the Radiation Therapy and You book and discussed the potential side effects of radiation including fatigue, hair loss and skin changes.  She was given the skin care handout.  She was also given Alra deoderant and Radiaplex gel.  She was advised to apply the radiaplex gel twice a day, after treatment and at bedtime.  She was oriented to the clinic and educated on PUT day with Dr. Kathrynn Running on Friday's.  She was advised to contact nursing with any questions or concerns.

## 2013-08-02 NOTE — Progress Notes (Signed)
[name] here for post sim ed.  She stated that she has redness and itching under her right breast.  On inspection, it was red.  Notified Dr. Basilio Cairo.

## 2013-08-03 ENCOUNTER — Ambulatory Visit: Payer: Medicare Other | Admitting: Radiation Oncology

## 2013-08-03 ENCOUNTER — Ambulatory Visit
Admission: RE | Admit: 2013-08-03 | Discharge: 2013-08-03 | Disposition: A | Discharge status: Home / Self Care | Payer: Medicare Other | Source: Ambulatory Visit | Attending: Radiation Oncology | Admitting: Radiation Oncology

## 2013-08-03 NOTE — Progress Notes (Signed)
[name] was seen in the dressing room area with sweating flushed face and shaking of her hands.  She informed Sheralyn Boatman that her blood sugar was down and she need a soft drink and crackers.   Arrived to dressing room and assessed her BS which was 44 at 4:45 pm given coke and peanut butter crackers.  BS was 64 checked at  5:00pm and she was less shaky, and had stopped sweating at this time.  BS 103 at 5:15pm.  She stated she was much better and is currently sitting in  dressing room waiting to be escorted out. Accompanied by Damita Dunnings and she expressed her thanks.

## 2013-08-04 ENCOUNTER — Encounter: Payer: Self-pay | Admitting: Radiation Oncology

## 2013-08-04 ENCOUNTER — Ambulatory Visit
Admission: RE | Admit: 2013-08-04 | Discharge: 2013-08-04 | Disposition: A | Discharge status: Home / Self Care | Payer: Medicare Other | Source: Ambulatory Visit | Attending: Radiation Oncology | Admitting: Radiation Oncology

## 2013-08-04 ENCOUNTER — Ambulatory Visit: Payer: Medicare Other | Admitting: Radiation Oncology

## 2013-08-04 VITALS — BP 181/86 | HR 81 | Temp 99.5°F

## 2013-08-04 DIAGNOSIS — C50211 Malignant neoplasm of upper-inner quadrant of right female breast: Secondary | ICD-10-CM

## 2013-08-04 NOTE — Progress Notes (Signed)
  Radiation Oncology         [phone] ________________________________  Name: [name] MRN: [id number]  Date: 08/04/2013  DOB: [birth year]  Weekly Radiation Therapy Management  Current Dose: 6 Gy     Planned Dose:  60 Gy  Narrative . . . . . . . . The patient presents for routine under treatment assessment.                                                      The patient has had a few episodes of afternoon hypoglycemia, and felt poorly at the time of her radiation treatment.  The last three days the glucose was 64, 44, and today 74.  She is on a sliding scale of Humulin R with Lantis.  She has even decreased the amount of Humulin R without success.  Today, her glucose after lunch was 281, he sliding scale suggested 28 un Humulin R and she decided to take 18 un with recent events.  Even with that reduction she felt diaphoretic, weak, and BS was 74.  She is set to see Dr. Allena Katz 10/14.                                 Set-up films were reviewed.                                 The chart was checked. Physical Findings. . .  temperature is 99.5 F (37.5 C). Her blood pressure is 181/86 and her pulse is 81. Her oxygen saturation is 99%. . Weight essentially stable.  No significant changes. Impression . . . . . . . The patient is tolerating radiation. Plan . . . . . . . . . . . . Continue treatment as planned.  ________________________________  Artist Pais. Kathrynn Running, M.D.

## 2013-08-04 NOTE — Progress Notes (Signed)
Patient brought to nursing prior to treatment as was shaky with slight headache to check blood sugar which is fine at 74.Patient had same episode yesterday with blood sugar of 44 and increased to 64.Blood pressure elevated at 181/86 p81.Denies pain or nausea.Patient is stable during write up.to see Dr.Manning and then have radiation treatment.Has Radiation therapy and You booklet.

## 2013-08-05 ENCOUNTER — Ambulatory Visit
Admission: RE | Admit: 2013-08-05 | Discharge: 2013-08-05 | Disposition: A | Discharge status: Home / Self Care | Payer: Medicare Other | Source: Ambulatory Visit | Attending: Radiation Oncology | Admitting: Radiation Oncology

## 2013-08-05 ENCOUNTER — Encounter: Payer: Self-pay | Admitting: Surgery

## 2013-08-08 ENCOUNTER — Ambulatory Visit (HOSPITAL_BASED_OUTPATIENT_CLINIC_OR_DEPARTMENT_OTHER)
Admission: RE | Admit: 2013-08-08 | Discharge: 2013-08-08 | Disposition: A | Discharge status: Home / Self Care | Payer: Medicare Other | Source: Ambulatory Visit | Attending: Family | Admitting: Family

## 2013-08-08 ENCOUNTER — Ambulatory Visit
Admission: RE | Admit: 2013-08-08 | Discharge: 2013-08-08 | Disposition: A | Discharge status: Home / Self Care | Payer: Medicare Other | Source: Ambulatory Visit | Attending: Radiation Oncology | Admitting: Radiation Oncology

## 2013-08-08 ENCOUNTER — Telehealth: Payer: Self-pay | Admitting: Family

## 2013-08-08 ENCOUNTER — Ambulatory Visit (INDEPENDENT_AMBULATORY_CARE_PROVIDER_SITE_OTHER): Payer: Medicare Other | Admitting: Family

## 2013-08-08 ENCOUNTER — Ambulatory Visit: Payer: Medicare Other | Admitting: Family

## 2013-08-08 ENCOUNTER — Encounter: Payer: Medicare Other | Admitting: Surgery

## 2013-08-08 VITALS — BP 162/80 | Temp 97.7°F

## 2013-08-08 DIAGNOSIS — M25579 Pain in unspecified ankle and joints of unspecified foot: Secondary | ICD-10-CM | POA: Insufficient documentation

## 2013-08-08 DIAGNOSIS — E119 Type 2 diabetes mellitus without complications: Secondary | ICD-10-CM | POA: Insufficient documentation

## 2013-08-08 DIAGNOSIS — M7989 Other specified soft tissue disorders: Secondary | ICD-10-CM

## 2013-08-08 DIAGNOSIS — Z86718 Personal history of other venous thrombosis and embolism: Secondary | ICD-10-CM | POA: Insufficient documentation

## 2013-08-08 DIAGNOSIS — Z86711 Personal history of pulmonary embolism: Secondary | ICD-10-CM | POA: Insufficient documentation

## 2013-08-08 DIAGNOSIS — L03116 Cellulitis of left lower limb: Secondary | ICD-10-CM

## 2013-08-08 DIAGNOSIS — L02419 Cutaneous abscess of limb, unspecified: Secondary | ICD-10-CM

## 2013-08-08 MED ORDER — DOXYCYCLINE HYCLATE 100 MG PO TABS: 100.0000 mg | ORAL_TABLET | Freq: Two times a day (BID) | ORAL | Status: DC

## 2013-08-08 MED ORDER — HYDROCODONE-ACETAMINOPHEN 5-300 MG PO TABS: 1.0000 | ORAL_TABLET | Freq: Four times a day (QID) | ORAL | Status: DC | PRN

## 2013-08-08 NOTE — Telephone Encounter (Signed)
Pt reports that she has appointment scheduled this morning at the coumadin clinic with Golda [phone].  Spoke with Kirt Boys- appointment scheduled at 1:40 PM

## 2013-08-08 NOTE — Assessment & Plan Note (Signed)
Will rx with doxycycline.  Pt instructed to follow up in our office this Friday and to call sooner if increased pain/swelling, redness, discharge.  Coumadin clinic was notified of pt being started on doxycycline. Due to presumed subtherapeutic INR per pt report, will also obtain LLE doppler to rule out recurrent DVT.

## 2013-08-08 NOTE — Progress Notes (Signed)
Subjective:    Patient ID: [name], female    DOB: 09/15/1941, 72 y.o.   MRN: [id number]  HPI  [name] is a 72 yr old female who presents today with chief complaint of left leg pain and swelling. Reports that she woke up last night at 1 AM with pain and swelling in the left leg.  Pain is associated with a pus-like drainage on the left shin. She reports pain is improved with walking and worsened by rest.  She reports that she is scheduled to follow up with the coumadin clinic today and that they have her on lovenox injections again because "my blood is too thick."  Review of Systems    see HPI  Past Medical History  Diagnosis Date  . Hyperplastic colon polyp   . Diabetes mellitus type II   . Osteoarthritis   . GERD (gastroesophageal reflux disease)   . Osteopenia   . Lupus     of skin see dermatologist frequently  . OA (osteoarthritis of spine)     C-spine  . DVT (deep venous thrombosis)     right leg DVT 05-17-2008  . Leg swelling     left leg 2-10, u/s showed a old clot, has a hypercoag panel,  . History of cardiovascular stress test     10/09 had CP, stress test neg, likely GI (GERD)-09/2006-had CP: stress test (-)  . 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 DVT (deep vein thrombosis) in pregnancy   . DVT of leg (deep venous thrombosis) 07/28/2011  . 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 Allergy   . Clotting disorder     hx of PE and DVT  . C. difficile diarrhea     hx of,   . Skin cancer     skin CA  . Left leg pain 03/12/2013  . Otitis externa 03/19/2013    right  . PONV (postoperative nausea and vomiting)   . Hypertension     pcp  dr Lendell Caprice   h.p   lebaur  . 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 Marital Status: Widowed    Spouse Name: N/A    Number of Children: 3  . Years of Education: N/A   Occupational  History  . Retired    Social History Main Topics  . Smoking status: Never Smoker   . Smokeless tobacco: Never Used  . Alcohol Use: No  . Drug Use: No  . Sexual Activity: Not Currently   Other Topics Concern  . Not on file   Social History Narrative   Single-widow    3 children , 1 in [place] (son, daughter - [place])    [place], [place]   Alcohol Use - no      tobacco-- never     Illicit Drug Use - no    Pt is Jehovah's Witness-No blood products   Daughter - [name]           Past Surgical History  Procedure Laterality Date  . Cholecystectomy    . Breast biopsy      left breast and right breast  . Breast lumpectomy      left breast-ductal carcinoma in situ  . Appendectomy    . Skin cancer excision  from back   . Breast lumpectomy with needle localization and axillary sentinel lymph node bx Right 06/17/2013    Procedure: BREAST LUMPECTOMY WITH NEEDLE LOCALIZATION AND AXILLARY SENTINEL LYMPH NODE BX;  Surgeon: [name], MD;  Location: [facility] OR;  Service: General;  Laterality: Right;    Family History  Problem Relation Age of Onset  . Diabetes Mother   . Diabetes    . Heart attack Neg Hx   . Colon cancer Neg Hx   . Breast cancer Neg Hx   . Esophageal cancer Neg Hx   . Rectal cancer Neg Hx   . Stomach cancer Neg Hx   . Cancer Father     melanoma    Allergies  Allergen Reactions  . Fluconazole Other (See Comments)    REACTION: severe fatigue and muscle weakness  . Metformin Diarrhea  . Pioglitazone Other (See Comments)    REACTION: wt gain  . Vancomycin     Erythematous rash, hands and toes became cyanotic.      Current Outpatient Prescriptions on File Prior to Visit  Medication Sig Dispense Refill  . B Complex-C (SUPER B COMPLEX PO) Take 1 tablet by mouth daily.      . Calcium Carbonate-Vitamin D (CALCIUM-VITAMIN D) 600-200 MG-UNIT CAPS Take 1 tablet by mouth daily.       . diclofenac sodium (VOLTAREN) 1 % GEL Apply 4 g  topically 3 (three) times daily.      Marland Kitchen doxycycline (VIBRA-TABS) 100 MG tablet Take 1 tablet (100 mg total) by mouth 2 (two) times daily.  20 tablet  0  . furosemide (LASIX) 20 MG tablet Take 20 mg by mouth daily as needed for fluid.      Marland Kitchen glucosamine-chondroitin 500-400 MG tablet Take 1 tablet by mouth daily.       . Hydrocodone-Acetaminophen 5-300 MG TABS Take 1 tablet by mouth every 6 (six) hours as needed.  15 each  0  . insulin glargine (LANTUS) 100 UNIT/ML injection Inject 44 Units into the skin at bedtime.       . insulin regular (NOVOLIN R,HUMULIN R) 100 units/mL injection Inject into the skin 3 (three) times daily before meals. Sliding scale 20-32 units      . lisinopril (PRINIVIL,ZESTRIL) 40 MG tablet Take 40 mg by mouth daily.      . miconazole (MICOTIN) 2 % powder Apply to rashes under breasts 2-3 times a day for 2 weeks  71 g  2  . niacin 500 MG tablet Take 500 mg by mouth daily.       Marland Kitchen warfarin (COUMADIN) 5 MG tablet Take 5-7.5 mg by mouth daily. Take 7.5mg  on Monday thru Friday, and 5mg t on Saturday and Sunday.       No current facility-administered medications on file prior to visit.    BP 162/80  Temp(Src) 97.7 F (36.5 C)    Objective:   Physical Exam  Constitutional: She is oriented to person, place, and time. She appears well-developed and well-nourished. No distress.  Cardiovascular: Normal rate and regular rhythm.   No murmur heard. Pulmonary/Chest: Effort normal and breath sounds normal. No respiratory distress. She has no wheezes. She has no rales. She exhibits no tenderness.  Musculoskeletal:  Trace RLE edema LLE swelling 2+ LLE hyperpigmentation of the left shin due to chronic venous stasis.  Small open area left shin with yellow discharge. No odor. Minimal redness, tenderness to palpation surrounding the ulcerated area. No induration   Neurological: She is  alert and oriented to person, place, and time.  Psychiatric: She has a normal mood and affect. Her  behavior is normal. Judgment and thought content normal.          Assessment & Plan:

## 2013-08-09 ENCOUNTER — Ambulatory Visit
Admission: RE | Admit: 2013-08-09 | Discharge: 2013-08-09 | Disposition: A | Discharge status: Home / Self Care | Payer: Medicare Other | Source: Ambulatory Visit | Attending: Radiation Oncology | Admitting: Radiation Oncology

## 2013-08-10 ENCOUNTER — Ambulatory Visit
Admission: RE | Admit: 2013-08-10 | Discharge: 2013-08-10 | Disposition: A | Discharge status: Home / Self Care | Payer: Medicare Other | Source: Ambulatory Visit | Attending: Radiation Oncology | Admitting: Radiation Oncology

## 2013-08-11 ENCOUNTER — Ambulatory Visit
Admission: RE | Admit: 2013-08-11 | Discharge: 2013-08-11 | Disposition: A | Discharge status: Home / Self Care | Payer: Medicare Other | Source: Ambulatory Visit | Attending: Radiation Oncology | Admitting: Radiation Oncology

## 2013-08-12 ENCOUNTER — Ambulatory Visit
Admission: RE | Admit: 2013-08-12 | Discharge: 2013-08-12 | Disposition: A | Discharge status: Home / Self Care | Payer: Medicare Other | Source: Ambulatory Visit | Attending: Radiation Oncology | Admitting: Radiation Oncology

## 2013-08-12 ENCOUNTER — Ambulatory Visit (INDEPENDENT_AMBULATORY_CARE_PROVIDER_SITE_OTHER): Payer: Medicare Other | Admitting: Family

## 2013-08-12 ENCOUNTER — Encounter: Payer: Self-pay | Admitting: Radiation Oncology

## 2013-08-12 ENCOUNTER — Encounter: Payer: Self-pay | Admitting: Family

## 2013-08-12 VITALS — BP 150/70 | HR 73 | Temp 98.3°F | Resp 16 | Ht 67.0 in | Wt 207.0 lb

## 2013-08-12 VITALS — BP 184/91 | HR 69 | Temp 98.3°F | Resp 20 | Wt 207.7 lb

## 2013-08-12 DIAGNOSIS — L02419 Cutaneous abscess of limb, unspecified: Secondary | ICD-10-CM

## 2013-08-12 DIAGNOSIS — L03116 Cellulitis of left lower limb: Secondary | ICD-10-CM

## 2013-08-12 DIAGNOSIS — C50211 Malignant neoplasm of upper-inner quadrant of right female breast: Secondary | ICD-10-CM

## 2013-08-12 NOTE — Assessment & Plan Note (Signed)
Resolved.  Complete doxy- keep today's coumadin clinic appointment for close monitoring due to abx. Advised pt to resume lasix daily as able.  She often skips due to concerns about urinary incontinence.

## 2013-08-12 NOTE — Progress Notes (Signed)
Subjective:    Patient ID: [name], female    DOB: 16-May-1941, 72 y.o.   MRN: [id number]  HPI  [name] is a 72 yr old female who presents today for follow up of her LLE cellulitis.  Last week she was started on doxycycline. She will see the coumadin clinic today.     Review of Systems See HPI  Past Medical History  Diagnosis Date  . Hyperplastic colon polyp   . Diabetes mellitus type II   . Osteoarthritis   . GERD (gastroesophageal reflux disease)   . Osteopenia   . Lupus     of skin see dermatologist frequently  . OA (osteoarthritis of spine)     C-spine  . DVT (deep venous thrombosis)     right leg DVT 05-17-2008  . Leg swelling     left leg 2-10, u/s showed a old clot, has a hypercoag panel,  . History of cardiovascular stress test     10/09 had CP, stress test neg, likely GI (GERD)-09/2006-had CP: stress test (-)  . 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 DVT (deep vein thrombosis) in pregnancy   . DVT of leg (deep venous thrombosis) 07/28/2011  . 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 Allergy   . Clotting disorder     hx of PE and DVT  . C. difficile diarrhea     hx of,   . Skin cancer     skin CA  . Left leg pain 03/12/2013  . Otitis externa 03/19/2013    right  . PONV (postoperative nausea and vomiting)   . Hypertension     pcp  dr Lendell Caprice   h.p   lebaur  . 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 Marital Status: Widowed    Spouse Name: N/A    Number of Children: 3  . Years of Education: N/A   Occupational History  . Retired    Social History Main Topics  . Smoking status: Never Smoker   . Smokeless tobacco: Never Used  . Alcohol Use: No  . Drug Use: No  . Sexual Activity: Not Currently   Other Topics Concern  . Not on file   Social History Narrative   Single-widow    3 children , 1 in [place] (son,  daughter - [place])    [place] from [place], [place]   Alcohol Use - no      tobacco-- never     Illicit Drug Use - no    Pt is Jehovah's Witness-No blood products   Daughter - [employer]           Past Surgical History  Procedure Laterality Date  . Cholecystectomy    . Breast biopsy      left breast and right breast  . Breast lumpectomy      left breast-ductal carcinoma in situ  . Appendectomy    . Skin cancer excision      from back   . Breast lumpectomy with needle localization and axillary sentinel lymph node bx Right 06/17/2013    Procedure: BREAST LUMPECTOMY WITH NEEDLE LOCALIZATION AND AXILLARY SENTINEL LYMPH NODE BX;  Surgeon: [name], MD;  Location: [facility] OR;  Service: General;  Laterality: Right;    Family History  Problem Relation Age of Onset  . Diabetes Mother   . Diabetes    . Heart attack Neg Hx   . Colon cancer Neg Hx   . Breast cancer Neg Hx   . Esophageal cancer Neg Hx   . Rectal cancer Neg Hx   . Stomach cancer Neg Hx   . Cancer Father     melanoma    Allergies  Allergen Reactions  . Fluconazole Other (See Comments)    REACTION: severe fatigue and muscle weakness  . Metformin Diarrhea  . Pioglitazone Other (See Comments)    REACTION: wt gain  . Vancomycin     Erythematous rash, hands and toes became cyanotic.      Current Outpatient Prescriptions on File Prior to Visit  Medication Sig Dispense Refill  . B Complex-C (SUPER B COMPLEX PO) Take 1 tablet by mouth daily.      . Calcium Carbonate-Vitamin D (CALCIUM-VITAMIN D) 600-200 MG-UNIT CAPS Take 1 tablet by mouth daily.       . diclofenac sodium (VOLTAREN) 1 % GEL Apply 4 g topically 3 (three) times daily.      Marland Kitchen doxycycline (VIBRA-TABS) 100 MG tablet Take 1 tablet (100 mg total) by mouth 2 (two) times daily.  20 tablet  0  . furosemide (LASIX) 20 MG tablet Take 20 mg by mouth daily as needed for fluid.      Marland Kitchen glucosamine-chondroitin 500-400 MG tablet Take 1 tablet by mouth  daily.       . Hydrocodone-Acetaminophen 5-300 MG TABS Take 1 tablet by mouth every 6 (six) hours as needed.  15 each  0  . insulin glargine (LANTUS) 100 UNIT/ML injection Inject 44 Units into the skin at bedtime.       . insulin regular (NOVOLIN R,HUMULIN R) 100 units/mL injection Inject into the skin 3 (three) times daily before meals. Sliding scale 20-32 units      . lisinopril (PRINIVIL,ZESTRIL) 40 MG tablet Take 40 mg by mouth daily.      . miconazole (MICOTIN) 2 % powder Apply to rashes under breasts 2-3 times a day for 2 weeks  71 g  2  . niacin 500 MG tablet Take 500 mg by mouth daily.       Marland Kitchen warfarin (COUMADIN) 5 MG tablet Take 5-7.5 mg by mouth daily. Take 7.5mg  on Monday thru Friday, and 5mg t on Saturday and Sunday.       No current facility-administered medications on file prior to visit.    BP 150/70  Pulse 73  Temp(Src) 98.3 F (36.8 C) (Oral)  Resp 16  Ht 5\' 7"  (1.702 m)  Wt 207 lb (93.895 kg)  BMI 32.41 kg/m2  SpO2 99%       Objective:   Physical Exam  Constitutional: She is oriented to person, place, and time. She appears well-developed and well-nourished. No distress.  HENT:  Head: Normocephalic and atraumatic.  Cardiovascular: Normal rate and regular rhythm.   No murmur heard. Pulmonary/Chest: Effort normal and breath sounds normal. No respiratory distress. She has no wheezes. She has no rales. She exhibits no tenderness.  Musculoskeletal:  2+ bilateral LE swelling  Neurological: She is alert and oriented to person, place, and time.  Skin:  Venous stasis hyperpigmentation bilateral shins  Previous wound left shin is healed. No significant erythema noted left shin.     Psychiatric: She has a normal mood and affect. Her behavior is normal. Judgment and thought content normal.  Assessment & Plan:

## 2013-08-12 NOTE — Progress Notes (Addendum)
Pt states her "BP is always high when she is here". Pt denies pain, fatigue at this time.  Reviewed correct time for lotion application. Pt does report pain in her upper chest and back when she sneezes. She denies HA, fever, cough, sore throat, sinus issues and states she sneezes twice a day. She states she feels it may be due to lying on the hard treatment table. Suggested she take Tylenol prior to treatment daily.  She states "Tylenol does not help her, and she cannot take Motrin due to taking Warfarin. "

## 2013-08-12 NOTE — Patient Instructions (Addendum)
Please keep your appointment with coumadin clinic.  Restart lasix for your swelling. Follow up in the end of October, sooner if problems/concerns.

## 2013-08-13 ENCOUNTER — Encounter: Payer: Self-pay | Admitting: Radiation Oncology

## 2013-08-13 NOTE — Progress Notes (Signed)
  Radiation Oncology         [phone] ________________________________  Name: [name] MRN: [id number]  Date: 08/12/2013  DOB: Jun 05, 1941  Weekly Radiation Therapy Management  Current Dose: 20 Gy     Planned Dose:  50 Gy  Narrative . . . . . . . . The patient presents for routine under treatment assessment.  Pt states her "BP is always high when she is here". Pt denies pain, fatigue at this time. Reviewed correct time for lotion application. Pt does report pain in her upper chest and back when she sneezes. She denies HA, fever, cough, sore throat, sinus issues and states she sneezes twice a day. She states she feels it may be due to lying on the hard treatment table. Suggested she take Tylenol prior to treatment daily. She states "Tylenol does not help her, and she cannot take Motrin due to taking Warfarin. "                                 Set-up films were reviewed.                                 The chart was checked. Physical Findings. . .  weight is 207 lb 11.2 oz (94.212 kg). Her oral temperature is 98.3 F (36.8 C). Her blood pressure is 184/91 and her pulse is 69. Her respiration is 20. . Weight essentially stable.  No significant changes. Impression . . . . . . . The patient is  tolerating radiation. Plan . . . . . . . . . . . . Continue treatment as planned.  ________________________________  Artist Pais. Kathrynn Running, M.D.

## 2013-08-15 ENCOUNTER — Ambulatory Visit
Admission: RE | Admit: 2013-08-15 | Discharge: 2013-08-15 | Disposition: A | Discharge status: Home / Self Care | Payer: Medicare Other | Source: Ambulatory Visit | Attending: Radiation Oncology | Admitting: Radiation Oncology

## 2013-08-15 VITALS — BP 159/64 | HR 71

## 2013-08-15 DIAGNOSIS — C50211 Malignant neoplasm of upper-inner quadrant of right female breast: Secondary | ICD-10-CM

## 2013-08-15 MED ORDER — RADIAPLEXRX EX GEL
Freq: Once | CUTANEOUS | Status: AC
  Administered 2013-08-15: 16:00:00 via TOPICAL

## 2013-08-15 NOTE — Progress Notes (Signed)
Patient does not recall receiving radiaplex despite records indicating she did on 08/01/2013. Provided patient with a tube of radiaplex and directed upon use. Glistening skin on cheeks noted. Reports her blood sugar was 400 this morning and she hasn't felt well since. Reports taking both her diabetic and blood pressure medication. Offered to take patient to emergency room for evaluation but, she refused. Requested that patient remain in clinic for nurse monitoring but, she refused. Encouraged patient not to drive and she verbalized understanding. Patient states,"I want to sit in the lobby until I feel better I promise I will not take any chances."

## 2013-08-16 ENCOUNTER — Ambulatory Visit
Admission: RE | Admit: 2013-08-16 | Discharge: 2013-08-16 | Disposition: A | Discharge status: Home / Self Care | Payer: Medicare Other | Source: Ambulatory Visit | Attending: Radiation Oncology | Admitting: Radiation Oncology

## 2013-08-17 ENCOUNTER — Ambulatory Visit
Admission: RE | Admit: 2013-08-17 | Discharge: 2013-08-17 | Disposition: A | Discharge status: Home / Self Care | Payer: Medicare Other | Source: Ambulatory Visit | Attending: Radiation Oncology | Admitting: Radiation Oncology

## 2013-08-18 ENCOUNTER — Ambulatory Visit
Admission: RE | Admit: 2013-08-18 | Discharge: 2013-08-18 | Disposition: A | Discharge status: Home / Self Care | Payer: Medicare Other | Source: Ambulatory Visit | Attending: Radiation Oncology | Admitting: Radiation Oncology

## 2013-08-19 ENCOUNTER — Ambulatory Visit
Admission: RE | Admit: 2013-08-19 | Discharge: 2013-08-19 | Disposition: A | Discharge status: Home / Self Care | Payer: Medicare Other | Source: Ambulatory Visit | Attending: Radiation Oncology | Admitting: Radiation Oncology

## 2013-08-19 ENCOUNTER — Encounter: Payer: Self-pay | Admitting: Radiation Oncology

## 2013-08-19 VITALS — BP 192/76 | HR 64 | Temp 98.3°F | Ht 67.0 in | Wt 208.7 lb

## 2013-08-19 DIAGNOSIS — B372 Candidiasis of skin and nail: Secondary | ICD-10-CM

## 2013-08-19 DIAGNOSIS — L304 Erythema intertrigo: Secondary | ICD-10-CM

## 2013-08-19 DIAGNOSIS — C50211 Malignant neoplasm of upper-inner quadrant of right female breast: Secondary | ICD-10-CM

## 2013-08-19 NOTE — Progress Notes (Signed)
[name] has received 15 fractions  To her right breast. Note erythema of breast, more so in the inframmary fold, but skin remains in intact.  She reports that her breast feels heavier and note swelling in the areolar region.  She also c/o fatigue.

## 2013-08-21 NOTE — Progress Notes (Signed)
  Radiation Oncology         [phone] ________________________________  Name: [name] MRN: [id number]  Date: 08/19/2013  DOB: 1941-04-22  Weekly Radiation Therapy Management  Current Dose: 30 Gy     Planned Dose:  60 Gy  Narrative . . . . . . . . The patient presents for routine under treatment assessment.                                                     The patient is without complaint.                                 Set-up films were reviewed.                                 The chart was checked. Physical Findings. . .  height is 5\' 7"  (1.702 m) and weight is 208 lb 11.2 oz (94.666 kg). Her temperature is 98.3 F (36.8 C). Her blood pressure is 192/76 and her pulse is 64. . Weight essentially stable.  No significant changes. Impression . . . . . . . The patient is tolerating radiation. Plan . . . . . . . . . . . . Continue treatment as planned.  ________________________________  Artist Pais. Kathrynn Running, M.D.

## 2013-08-22 ENCOUNTER — Ambulatory Visit
Admission: RE | Admit: 2013-08-22 | Discharge: 2013-08-22 | Disposition: A | Discharge status: Home / Self Care | Payer: Medicare Other | Source: Ambulatory Visit | Attending: Radiation Oncology | Admitting: Radiation Oncology

## 2013-08-23 ENCOUNTER — Ambulatory Visit
Admission: RE | Admit: 2013-08-23 | Discharge: 2013-08-23 | Disposition: A | Discharge status: Home / Self Care | Payer: Medicare Other | Source: Ambulatory Visit | Attending: Radiation Oncology | Admitting: Radiation Oncology

## 2013-08-24 ENCOUNTER — Ambulatory Visit
Admission: RE | Admit: 2013-08-24 | Discharge: 2013-08-24 | Disposition: A | Discharge status: Home / Self Care | Payer: Medicare Other | Source: Ambulatory Visit | Attending: Radiation Oncology | Admitting: Radiation Oncology

## 2013-08-25 ENCOUNTER — Ambulatory Visit
Admission: RE | Admit: 2013-08-25 | Discharge: 2013-08-25 | Disposition: A | Discharge status: Home / Self Care | Payer: Medicare Other | Source: Ambulatory Visit | Attending: Radiation Oncology | Admitting: Radiation Oncology

## 2013-08-26 ENCOUNTER — Encounter: Payer: Self-pay | Admitting: Radiation Oncology

## 2013-08-26 ENCOUNTER — Ambulatory Visit
Admission: RE | Admit: 2013-08-26 | Discharge: 2013-08-26 | Disposition: A | Discharge status: Home / Self Care | Payer: Medicare Other | Source: Ambulatory Visit | Attending: Radiation Oncology | Admitting: Radiation Oncology

## 2013-08-26 ENCOUNTER — Ambulatory Visit: Admission: RE | Admit: 2013-08-26 | Payer: Medicare Other | Source: Ambulatory Visit | Admitting: Radiation Oncology

## 2013-08-26 ENCOUNTER — Ambulatory Visit: Payer: Medicare Other

## 2013-08-26 VITALS — BP 183/75 | HR 70 | Resp 16 | Wt 206.5 lb

## 2013-08-26 DIAGNOSIS — D0592 Unspecified type of carcinoma in situ of left breast: Secondary | ICD-10-CM

## 2013-08-26 DIAGNOSIS — C50211 Malignant neoplasm of upper-inner quadrant of right female breast: Secondary | ICD-10-CM

## 2013-08-26 NOTE — Progress Notes (Signed)
  Radiation Oncology         (336) [phone] ________________________________  Name: [name] MRN: [id number]  Date: 08/26/2013  DOB: 03-28-1941  Weekly Radiation Therapy Management  Current Dose: 40 Gy     Planned Dose:  60 Gy  Narrative . . . . . . . . The patient presents for routine under treatment assessment.                                                      The patient is without complaint.                                 Set-up films were reviewed.                                 The chart was checked. Physical Findings. . .  weight is 206 lb 8 oz (93.668 kg). Her blood pressure is 183/75 and her pulse is 70. Her respiration is 16 and oxygen saturation is 100%. . Weight essentially stable.  She does have erythema of whole right breast and some desq/hyperpigmentation of right inframammary fold Impression . . . . . . . The patient is tolerating radiation. Plan . . . . . . . . . . . . Continue treatment as planned.  ________________________________  Artist Pais. Kathrynn Running, M.D.

## 2013-08-26 NOTE — Progress Notes (Signed)
Hyperpigmentation of right breast with desquamation under right breast. Reports that she uses radiaplex as bid. Encouraged her to continue radiaplex plus apply neosporin to area of desquamation. Mild fatigue. Reports that when she sneezes her chest hurts. Blood sugar 154.

## 2013-08-29 ENCOUNTER — Ambulatory Visit
Admission: RE | Admit: 2013-08-29 | Discharge: 2013-08-29 | Disposition: A | Discharge status: Home / Self Care | Payer: Medicare Other | Source: Ambulatory Visit | Attending: Radiation Oncology | Admitting: Radiation Oncology

## 2013-08-30 ENCOUNTER — Ambulatory Visit
Admission: RE | Admit: 2013-08-30 | Discharge: 2013-08-30 | Disposition: A | Discharge status: Home / Self Care | Payer: Medicare Other | Source: Ambulatory Visit | Attending: Radiation Oncology | Admitting: Radiation Oncology

## 2013-08-31 ENCOUNTER — Ambulatory Visit
Admission: RE | Admit: 2013-08-31 | Discharge: 2013-08-31 | Disposition: A | Discharge status: Home / Self Care | Payer: Medicare Other | Source: Ambulatory Visit | Attending: Radiation Oncology | Admitting: Radiation Oncology

## 2013-09-01 ENCOUNTER — Ambulatory Visit
Admission: RE | Admit: 2013-09-01 | Discharge: 2013-09-01 | Disposition: A | Discharge status: Home / Self Care | Payer: Medicare Other | Source: Ambulatory Visit | Attending: Radiation Oncology | Admitting: Radiation Oncology

## 2013-09-02 ENCOUNTER — Ambulatory Visit
Admission: RE | Admit: 2013-09-02 | Discharge: 2013-09-02 | Disposition: A | Discharge status: Home / Self Care | Payer: Medicare Other | Source: Ambulatory Visit | Attending: Radiation Oncology | Admitting: Radiation Oncology

## 2013-09-02 ENCOUNTER — Ambulatory Visit: Payer: Medicare Other | Admitting: Radiation Oncology

## 2013-09-02 ENCOUNTER — Encounter: Payer: Self-pay | Admitting: Radiation Oncology

## 2013-09-02 VITALS — BP 191/71 | HR 74 | Resp 20 | Wt 208.0 lb

## 2013-09-02 DIAGNOSIS — C50211 Malignant neoplasm of upper-inner quadrant of right female breast: Secondary | ICD-10-CM

## 2013-09-02 NOTE — Progress Notes (Signed)
Hyperpigmentation of right breast noted. Dermatitis noted right upper chest. Moist desquamation under right breast noted but, no better or worse since last week. Reports using radiaplex and neosporin on right breast. Reports fatigue. Red puff glossy face noted.

## 2013-09-02 NOTE — Progress Notes (Signed)
   Department of Radiation Oncology  Phone:  [phone] Fax:        [phone]  Weekly Treatment Note    Name: [name] Date: 09/02/2013 MRN: [id number] DOB: 12-16-1940   Current dose: 50 Gy  Current fraction: 25   MEDICATIONS: Current Outpatient Prescriptions  Medication Sig Dispense Refill  . B Complex-C (SUPER B COMPLEX PO) Take 1 tablet by mouth daily.      . Blood Glucose Monitoring Suppl (FREESTYLE LITE) DEVI       . Calcium Carbonate-Vitamin D (CALCIUM-VITAMIN D) 600-200 MG-UNIT CAPS Take 1 tablet by mouth daily.       . diclofenac sodium (VOLTAREN) 1 % GEL Apply 4 g topically 3 (three) times daily.      . furosemide (LASIX) 20 MG tablet Take 20 mg by mouth daily as needed for fluid.      Marland Kitchen glucosamine-chondroitin 500-400 MG tablet Take 1 tablet by mouth daily.       . Hydrocodone-Acetaminophen 5-300 MG TABS Take 1 tablet by mouth every 6 (six) hours as needed.  15 each  0  . insulin glargine (LANTUS) 100 UNIT/ML injection Inject 44 Units into the skin at bedtime.       . insulin regular (NOVOLIN R,HUMULIN R) 100 units/mL injection Inject into the skin 3 (three) times daily before meals. Sliding scale 20-32 units      . lisinopril (PRINIVIL,ZESTRIL) 40 MG tablet Take 40 mg by mouth daily.      . miconazole (MICOTIN) 2 % powder Apply to rashes under breasts 2-3 times a day for 2 weeks  71 g  2  . niacin 500 MG tablet Take 500 mg by mouth daily.       Marland Kitchen warfarin (COUMADIN) 5 MG tablet Take 5-7.5 mg by mouth daily. Take 7.5mg  on Monday thru Friday, and 5mg t on Saturday and Sunday.      . Wound Cleansers (RADIAPLEX EX) Apply topically.       No current facility-administered medications for this encounter.     ALLERGIES: Fluconazole; Metformin; Pioglitazone; and Vancomycin   LABORATORY DATA:  Lab Results  Component Value Date   WBC 8.2 06/09/2013   HGB 12.4 06/09/2013   HCT 37.0 06/09/2013   MCV 83.3 06/09/2013   PLT 201 06/09/2013   Lab Results    Component Value Date   NA 141 07/22/2013   K 3.8 07/22/2013   CL 105 07/22/2013   CO2 26 07/22/2013   Lab Results  Component Value Date   ALT 26 07/22/2013   AST 24 07/22/2013   ALKPHOS 91 07/22/2013   BILITOT 0.5 07/22/2013     NARRATIVE: [name] was seen today for weekly treatment management. The chart was checked and the patient's films were reviewed. The patient states that she is doing fairly well this week. Continued skin irritation in the treatment area which is stable. Greatest irritation underneath the right breast.  PHYSICAL EXAMINATION: weight is 208 lb (94.348 kg). Her blood pressure is 191/71 and her pulse is 74. Her respiration is 20.      the patient has diffuse radiation dermatitis present with a little bit of moist desquamation/bright erythema in the inframammary region.  ASSESSMENT: The patient is doing satisfactorily with treatment.  PLAN: We will continue with the patient's radiation treatment as planned. The patient will continue her current skin care regimen but we'll switch to Neosporin medication with pain.

## 2013-09-05 ENCOUNTER — Ambulatory Visit
Admission: RE | Admit: 2013-09-05 | Discharge: 2013-09-05 | Disposition: A | Discharge status: Home / Self Care | Payer: Medicare Other | Source: Ambulatory Visit | Attending: Radiation Oncology | Admitting: Radiation Oncology

## 2013-09-05 ENCOUNTER — Encounter: Payer: Self-pay | Admitting: Radiation Oncology

## 2013-09-05 DIAGNOSIS — C50211 Malignant neoplasm of upper-inner quadrant of right female breast: Secondary | ICD-10-CM

## 2013-09-05 DIAGNOSIS — C50911 Malignant neoplasm of unspecified site of right female breast: Secondary | ICD-10-CM

## 2013-09-05 NOTE — Progress Notes (Addendum)
Less redness and puffiness noted in patient's face. Patient saw PCP reference elevated blood sugars. She reports that her PCP told her that radiation therapy was making her blood sugar go up. However, patient reports her PCP reduced her insulin medication. Hyperpigmentation of right breast noted with moist desquamation under right breast. Provided non adherent dressings to patient to wick away moisture and use as a barrier. Patient reports using radiation on top of her breast and neosporin under her breast. Refused vital sign assessment.

## 2013-09-05 NOTE — Progress Notes (Signed)
  Radiation Oncology         [phone] ________________________________  Name: [name] MRN: [id number]  Date: 09/05/2013  DOB: March 05, 1941  Weekly Radiation Therapy Management  Current Dose: 50 Gy     Planned Dose:  60 Gy  Narrative . . . . . . . . The patient presents for routine under treatment assessment.              She is continuing to struggle with breast peeling and pain                                 Set-up films were reviewed.                                 The chart was checked. Physical Findings. . . She has moist desq in IM fold and follicular rash around it.  She also has a palpable rubbery non-tender round mass in the right axilla. Weight essentially stable.  No significant changes. Impression . . . . . . . The patient is tolerating radiation. Plan . . . . . . . . . . . . Continue treatment as planned.  She will use silvadene on moist desq around it, miconazole on surrounding follicular rash.  She will also see Dr. Carolynne Edouard in 2 weeks for his evaluation of her axillary mass. It could reflect seroma versus reactive adenopathy to her desquamation.  But, axillary metastasis cannot be completely ruled out, so if the axillary nodule persists, it may need ultrasound.  ________________________________  Artist Pais Kathrynn Running, M.D.

## 2013-09-06 ENCOUNTER — Ambulatory Visit
Admission: RE | Admit: 2013-09-06 | Discharge: 2013-09-06 | Disposition: A | Discharge status: Home / Self Care | Payer: Medicare Other | Source: Ambulatory Visit | Attending: Radiation Oncology | Admitting: Radiation Oncology

## 2013-09-06 MED ORDER — SILVER SULFADIAZINE 1 % EX CREA
TOPICAL_CREAM | Freq: Every day | CUTANEOUS | Status: AC
  Administered 2013-09-06: 10:00:00 via TOPICAL

## 2013-09-06 NOTE — Addendum Note (Signed)
Encounter addended by: Agnes Lawrence, RN on: 09/06/2013 10:27 AM<BR>     Documentation filed: Inpatient MAR

## 2013-09-06 NOTE — Progress Notes (Signed)
Late entry from 09/05/2013 at 1720. Provided patient with SSD cream, miconazole powder, and hydrogel pads as directed by Dr. Kathrynn Running. Educated patient on use. Had patient return demonstration of applying ssd cream to moist desquamation under right breast, miconazole powder on surrounding follicular rash under both breast, and radiaplex to top of right breast. Also, patient demonstrated proper use of hydrogel pads. All questions answered. Patient verbalized understanding of all reviewed. Will reassess skin on Wednesday of this week. Patient understand to contact staff with needs.

## 2013-09-06 NOTE — Addendum Note (Signed)
Encounter addended by: Agnes Lawrence, RN on: 09/06/2013 10:23 AM<BR>     Documentation filed: Notes Section, Visit Diagnoses, Orders

## 2013-09-07 ENCOUNTER — Ambulatory Visit
Admission: RE | Admit: 2013-09-07 | Discharge: 2013-09-07 | Disposition: A | Discharge status: Home / Self Care | Payer: Medicare Other | Source: Ambulatory Visit | Attending: Radiation Oncology | Admitting: Radiation Oncology

## 2013-09-07 ENCOUNTER — Encounter: Payer: Self-pay | Admitting: Radiation Oncology

## 2013-09-07 VITALS — BP 130/82 | HR 70 | Resp 16 | Wt 204.5 lb

## 2013-09-07 DIAGNOSIS — C50211 Malignant neoplasm of upper-inner quadrant of right female breast: Secondary | ICD-10-CM

## 2013-09-07 NOTE — Progress Notes (Signed)
  Radiation Oncology         [phone] ________________________________  Name: [name] MRN: [id number]  Date: 09/07/2013  DOB: Jul 21, 1941  Weekly Radiation Therapy Management  Current Dose: 56 Gy     Planned Dose:  60 Gy  Narrative . . . . . . . . The patient presents for routine under treatment assessment.                                   Moist desquamation under right breast worse. 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 Hyperpigmentation of right chest wall noted. Reports using hydrogel pads, radiaplex, miconazole power, and ssd as directed. Reports fatigue                                 Set-up films were reviewed.                                 The chart was checked. Physical Findings. . .  weight is 204 lb 8 oz (92.761 kg). Her blood pressure is 130/82 and her pulse is 70. Her respiration is 16. . Weight essentially stable.  No significant changes. Impression . . . . . . . The patient is tolerating radiation. Plan . . . . . . . . . . . . Continue treatment as planned.  ________________________________  Artist Pais. Kathrynn Running, M.D.

## 2013-09-07 NOTE — Progress Notes (Signed)
Reports she was reaching for a pot under her cabinet to make a pot of chicken noodle soup for her dog. She reports that she fell back on her back, bumped her head and the kitchen chair fell on him. Reports upper back pain since fall. Moist desquamation under right breast worse. 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 Hyperpigmentation of right chest wall noted. Reports using hydrogel pads, radiaplex, miconazole power, and ssd as directed. Reports fatigue.

## 2013-09-08 ENCOUNTER — Ambulatory Visit
Admission: RE | Admit: 2013-09-08 | Discharge: 2013-09-08 | Disposition: A | Discharge status: Home / Self Care | Payer: Medicare Other | Source: Ambulatory Visit | Attending: Radiation Oncology | Admitting: Radiation Oncology

## 2013-09-09 ENCOUNTER — Encounter: Payer: Self-pay | Admitting: Radiation Oncology

## 2013-09-09 ENCOUNTER — Ambulatory Visit
Admission: RE | Admit: 2013-09-09 | Discharge: 2013-09-09 | Disposition: A | Discharge status: Home / Self Care | Payer: Medicare Other | Source: Ambulatory Visit | Attending: Radiation Oncology | Admitting: Radiation Oncology

## 2013-09-09 VITALS — BP 138/78 | HR 72 | Resp 18 | Wt 207.3 lb

## 2013-09-09 DIAGNOSIS — C50911 Malignant neoplasm of unspecified site of right female breast: Secondary | ICD-10-CM

## 2013-09-09 NOTE — Progress Notes (Signed)
Patient completed treatment today. Hyperpigmentation of right chest wall noted. Moist desquamation and hyperpigmentation of the underside of her right breast noted. Follicular rash beneath both breast improved. Using hydrogels pads, radiaplex, miconazole powder and ssd as directed. Reports fatigue.

## 2013-09-09 NOTE — Progress Notes (Signed)
Weekly Management Note Current Dose: 60  Gy  Projected Dose: 60 Gy   Narrative:  The patient presents for routine under treatment assessment.  CBCT/MVCT images/Port film x-rays were reviewed.  The chart was checked. Moist desquamation continues. Using silvadene, myconazole powder and hydrogel. Using cool hairdryer at home to keep dry.   Physical Findings: Weight: 207 lb 4.8 oz (94.031 kg). Moist desquamation in inframammary folds. Some new skin islands. Rest of breast is red but intact.   Impression:  The patient finishes RT today with significant moist desquamation.  Plan:  Needs time to heal. D/C powder. Continue silvadene. F/u with nursing in 1 week. RN will call early next week for appt. Keep dry.

## 2013-09-11 NOTE — Progress Notes (Signed)
  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  The patient's whole right breast was treated to 50 Gy in 25 fractions of 2 Gy 2.  the patient's lumpectomy site was boosted with an additional 10 Gy in 5 fractions of 2 Gy producing a total cumulative dose of 60 Gy to the lumpectomy site  Beams/energy:    1.  The patient's whole right breast was treated using tangential photon fields with 6 megavolt x-rays delivered according to complex isodose planning with field and field compensation to reduce hot spots. 2.  the patient's lumpectomy site was boosted using 15 megavolt electron volt electrons covering the lumpectomy site, surgical incision and a small margin. The special port plan was used in order to characterize the depth dose distribution of the electron treatment.   Narrative: The patient tolerated radiation treatment relatively well.   Her course was notable for significant skin issues. The patient had inframammary irritation and redness with some dry desquamation upon completion. This was felt to potentially represent radiation dermatitis with some superimposed fungal superinfection.  The patient also struggled to maintain blood sugars within the right range in the treatment of her diabetes.  Plan: The patient has completed radiation treatment. The patient will return to radiation oncology clinic for routine followup in one month. I advised them to call or return sooner if they have any questions or concerns related to their recovery or treatment. ________________________________  Artist Pais. Kathrynn Running, M.D.

## 2013-09-12 ENCOUNTER — Telehealth: Payer: Self-pay | Admitting: Radiation Oncology

## 2013-09-12 NOTE — Telephone Encounter (Signed)
Phoned to check status of patient's skin following the weekend. Tried patient's home and cell number. No answer. Left message requesting return call.

## 2013-09-15 ENCOUNTER — Telehealth (INDEPENDENT_AMBULATORY_CARE_PROVIDER_SITE_OTHER): Payer: Self-pay

## 2013-09-15 ENCOUNTER — Telehealth: Payer: Self-pay | Admitting: *Deleted

## 2013-09-15 ENCOUNTER — Telehealth: Payer: Self-pay | Admitting: Vascular Surgery

## 2013-09-15 ENCOUNTER — Telehealth: Payer: Self-pay | Admitting: Radiation Oncology

## 2013-09-15 NOTE — Telephone Encounter (Signed)
[name] at Dr Estanislado Spire office is calling re: referral they received in August. Pt has been contacted to make appt and was advised Dr Estanislado Spire first avail appt will not be until first of December. Pt was offered appt with Dr Darrick Penna who could see her much sooner. Pt wanted Dr Alfon[facility] Patten office to call and ask Dr Carolynne Edouard if change of MD will be ok. I advised [name] this msg will be sent to Dr Carolynne Edouard and his assistant to review. [name] can be reached by phone at [phone] or in epic under [name].

## 2013-09-15 NOTE — Telephone Encounter (Signed)
Msg sent to [name] in epic that Dr Carolynne Edouard is ok with pt seeing Dr Darrick Penna

## 2013-09-15 NOTE — Telephone Encounter (Signed)
It is ok

## 2013-09-15 NOTE — Telephone Encounter (Addendum)
Message copied by [name] on Thu Sep 15, 2013  2:50 PM ------      Message from: [name]      Created: Thu Sep 15, 2013  2:45 PM       Dr Carolynne Edouard is okay with patient seeing Dr Darrick Penna.             Thanks,      [name] ------   09/15/2013: Spoke with [name], she is aware that her appt with Dr Darrick Penna is on 11/06. She is also aware that Dr Carolynne Edouard is okay with seeing [facility], dpm

## 2013-09-15 NOTE — Telephone Encounter (Signed)
Second attempt made to reach patient and scheduled follow up. Patient needs a skin check appointment asap and a one month follow up appointment. Patient did not answer her cell or home number. Left message on both requesting a return call.

## 2013-09-15 NOTE — Telephone Encounter (Signed)
Patient returned call. Patient states, "underneath my breast looks so much better." Encouraged her to continue to use skin care products as directed. Transferred her to [facility] to schedule 15 minutes appointment for a skin check next week.

## 2013-09-15 NOTE — Telephone Encounter (Signed)
Spoke with patient.  She will see Dr. Kathrynn Running 09/21/13 at 1:00pm

## 2013-09-16 ENCOUNTER — Ambulatory Visit: Payer: Medicare Other | Admitting: Family

## 2013-09-19 ENCOUNTER — Ambulatory Visit (INDEPENDENT_AMBULATORY_CARE_PROVIDER_SITE_OTHER): Payer: Medicare Other | Admitting: Family

## 2013-09-19 ENCOUNTER — Encounter: Payer: Self-pay | Admitting: Family

## 2013-09-19 VITALS — BP 140/70 | HR 70 | Temp 98.6°F | Resp 16 | Ht 67.0 in | Wt 204.1 lb

## 2013-09-19 DIAGNOSIS — L02419 Cutaneous abscess of limb, unspecified: Secondary | ICD-10-CM

## 2013-09-19 DIAGNOSIS — C50911 Malignant neoplasm of unspecified site of right female breast: Secondary | ICD-10-CM

## 2013-09-19 DIAGNOSIS — I10 Essential (primary) hypertension: Secondary | ICD-10-CM

## 2013-09-19 DIAGNOSIS — C50919 Malignant neoplasm of unspecified site of unspecified female breast: Secondary | ICD-10-CM

## 2013-09-19 DIAGNOSIS — L03116 Cellulitis of left lower limb: Secondary | ICD-10-CM

## 2013-09-19 NOTE — Assessment & Plan Note (Signed)
Recently completed radiation.  Management per oncology.

## 2013-09-19 NOTE — Progress Notes (Signed)
Subjective:    Patient ID: [name], female    DOB: Aug 07, 1941, 72 y.o.   MRN: [id number]  HPI [name] is a 72 year old female who presents today for follow up.  Breast CA) Completed radiation at [facility], Follows with Dr. Kathrynn Running.  HTN) 140/80 today, patient on Lisinopril, denies headaches, chest pain.  Cellulitis) No rash, open wounds. Skin mildly red, edema present to left ankle, patient reports she is seeing a vein specialist later this week.    Review of Systems  Constitutional: Negative for activity change and fatigue.  HENT: Negative for rhinorrhea and sore throat.   Respiratory: Negative for cough and shortness of breath.   Cardiovascular: Negative for chest pain.  Neurological: Negative for dizziness and headaches.   Past Medical History  Diagnosis Date  . Hyperplastic colon polyp   . Diabetes mellitus type II   . Osteoarthritis   . GERD (gastroesophageal reflux disease)   . Osteopenia   . Lupus     of skin see dermatologist frequently  . OA (osteoarthritis of spine)     C-spine  . DVT (deep venous thrombosis)     right leg DVT 05-17-2008  . Leg swelling     left leg 2-10, u/s showed a old clot, has a hypercoag panel,  . History of cardiovascular stress test     10/09 had CP, stress test neg, likely GI (GERD)-09/2006-had CP: stress test (-)  . 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 DVT (deep vein thrombosis) in pregnancy   . DVT of leg (deep venous thrombosis) 07/28/2011  . 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 Allergy   . Clotting disorder     hx of PE and DVT  . C. difficile diarrhea     hx of,   . Skin cancer     skin CA  . Left leg pain 03/12/2013  . Otitis externa 03/19/2013    right  . PONV (postoperative nausea and vomiting)   . Hypertension     pcp  dr Lendell Caprice   h.p   [facility]baur  . Breast cancer     left ductal breast ca dx 2003 approx, s/p XRt x 6 weeks, released from oncology (per  patient); dx right breast ca 2014     History   Social History  . Marital Status: Widowed    Spouse Name: N/A    Number of Children: 3  . Years of Education: N/A   Occupational History  . Retired    Social History Main Topics  . Smoking status: Never Smoker   . Smokeless tobacco: Never Used  . Alcohol Use: No  . Drug Use: No  . Sexual Activity: Not Currently   Other Topics Concern  . Not on file   Social History Narrative   Single-widow    3 children , 1 in [place] (son, daughter - [place])    [place] to [place] from [place], [place]   Alcohol Use - no      tobacco-- never     Illicit Drug Use - no    Pt is Jehovah's Witness-No blood products   Daughter - [name]           Past Surgical History  Procedure Laterality Date  . Cholecystectomy    . Breast biopsy      left breast and right breast  . Breast lumpectomy      left breast-ductal carcinoma in situ  . Appendectomy    .  Skin cancer excision      from back   . Breast lumpectomy with needle localization and axillary sentinel lymph node bx Right 06/17/2013    Procedure: BREAST LUMPECTOMY WITH NEEDLE LOCALIZATION AND AXILLARY SENTINEL LYMPH NODE BX;  Surgeon: [name], MD;  Location: [facility] OR;  Service: General;  Laterality: Right;    Family History  Problem Relation Age of Onset  . Diabetes Mother   . Diabetes    . Heart attack Neg Hx   . Colon cancer Neg Hx   . Breast cancer Neg Hx   . Esophageal cancer Neg Hx   . Rectal cancer Neg Hx   . Stomach cancer Neg Hx   . Cancer Father     melanoma    Allergies  Allergen Reactions  . Fluconazole Other (See Comments)    REACTION: severe fatigue and muscle weakness  . Metformin Diarrhea  . Pioglitazone Other (See Comments)    REACTION: wt gain  . Vancomycin     Erythematous rash, hands and toes became cyanotic.      Current Outpatient Prescriptions on File Prior to Visit  Medication Sig Dispense Refill  . B Complex-C (SUPER B COMPLEX PO) Take 1 tablet by mouth daily.      .  Blood Glucose Monitoring Suppl (FREESTYLE LITE) DEVI       . Calcium Carbonate-Vitamin D (CALCIUM-VITAMIN D) 600-200 MG-UNIT CAPS Take 1 tablet by mouth daily.       Marland Kitchen glucosamine-chondroitin 500-400 MG tablet Take 1 tablet by mouth daily.       . insulin glargine (LANTUS) 100 UNIT/ML injection Inject 48 Units into the skin at bedtime.       Marland Kitchen lisinopril (PRINIVIL,ZESTRIL) 40 MG tablet Take 40 mg by mouth daily.      . niacin 500 MG tablet Take 500 mg by mouth daily.       Marland Kitchen warfarin (COUMADIN) 5 MG tablet Take 5-7.5 mg by mouth daily. Take 7.5mg  on Monday thru Friday, and 5mg t on Saturday and Sunday.      . furosemide (LASIX) 20 MG tablet Take 20 mg by mouth daily as needed for fluid.      Marland Kitchen insulin regular (NOVOLIN R,HUMULIN R) 100 units/mL injection Inject into the skin 3 (three) times daily before meals. Sliding scale 20-32 units      . Wound Cleansers (RADIAPLEX EX) Apply topically.       Current Facility-Administered Medications on File Prior to Visit  Medication Dose Route Frequency Provider Last Rate Last Dose  . silver sulfADIAZINE (SILVADENE) 1 % cream   Topical Daily Oneita Hurt, MD        BP 140/70  Pulse 70  Temp(Src) 98.6 F (37 C) (Oral)  Resp 16  Ht 5\' 7"  (1.702 m)  Wt 204 lb 1.3 oz (92.57 kg)  BMI 31.96 kg/m2  SpO2 99%       Objective:   Physical Exam  Constitutional: She is oriented to person, place, and time. She appears well-nourished.  HENT:  Head: Normocephalic.  Neck: Neck supple.  Cardiovascular: Normal rate and regular rhythm.   Pulses:      Radial pulses are 2+ on the right side.       Dorsalis pedis pulses are 2+ on the right side.       Posterior tibial pulses are 2+ on the right side.  Pulmonary/Chest: Effort normal and breath sounds normal.  Neurological: She is alert and oriented to person, place, and  time.  Skin: Skin is warm, dry and intact. No rash noted.  Psychiatric: She has a normal mood and affect.          Assessment &  Plan:

## 2013-09-19 NOTE — Assessment & Plan Note (Addendum)
BP 140/80 today.  Continue taking Lisinopril. Follow up in 3 months.

## 2013-09-19 NOTE — Assessment & Plan Note (Signed)
Resolved.  Keep appointment with vein specialist.  Follow up as needed.

## 2013-09-19 NOTE — Patient Instructions (Signed)
Please follow up in 3 months.  

## 2013-09-21 ENCOUNTER — Ambulatory Visit
Admission: RE | Admit: 2013-09-21 | Discharge: 2013-09-21 | Disposition: A | Discharge status: Home / Self Care | Payer: Medicare Other | Source: Ambulatory Visit | Attending: Radiation Oncology | Admitting: Radiation Oncology

## 2013-09-21 ENCOUNTER — Encounter: Payer: Self-pay | Admitting: Radiation Oncology

## 2013-09-21 ENCOUNTER — Encounter: Payer: Self-pay | Admitting: Vascular Surgery

## 2013-09-21 VITALS — BP 164/82 | HR 71 | Temp 97.7°F | Resp 18 | Wt 206.1 lb

## 2013-09-21 DIAGNOSIS — C50211 Malignant neoplasm of upper-inner quadrant of right female breast: Secondary | ICD-10-CM

## 2013-09-21 NOTE — Progress Notes (Signed)
  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  Her skin reaction has improved.                              ALLERGIES:  is allergic to fluconazole; metformin; pioglitazone; and vancomycin.  Meds: Current Outpatient Prescriptions  Medication Sig Dispense Refill  . B Complex-C (SUPER B COMPLEX PO) Take 1 tablet by mouth daily.      . Blood Glucose Monitoring Suppl (FREESTYLE LITE) DEVI       . Calcium Carbonate-Vitamin D (CALCIUM-VITAMIN D) 600-200 MG-UNIT CAPS Take 1 tablet by mouth daily.       . furosemide (LASIX) 20 MG tablet Take 20 mg by mouth daily as needed for fluid.      Marland Kitchen glucosamine-chondroitin 500-400 MG tablet Take 1 tablet by mouth daily.       . insulin glargine (LANTUS) 100 UNIT/ML injection Inject 48 Units into the skin at bedtime.       . insulin regular (NOVOLIN R,HUMULIN R) 100 units/mL injection Inject into the skin 3 (three) times daily before meals. Sliding scale 20-32 units      . lisinopril (PRINIVIL,ZESTRIL) 40 MG tablet Take 40 mg by mouth daily.      . niacin 500 MG tablet Take 500 mg by mouth daily.       . silver sulfADIAZINE (SILVADENE) 1 % cream Apply 1 application topically daily.      Marland Kitchen warfarin (COUMADIN) 5 MG tablet Take 5-7.5 mg by mouth daily. Take 7.5mg  on Monday thru Friday, and 5mg t on Saturday and Sunday.      . Wound Cleansers (RADIAPLEX EX) Apply topically.       No current facility-administered medications for this encounter.   Facility-Administered Medications Ordered in Other Encounters  Medication Dose Route Frequency Provider Last Rate  Last Dose  . silver sulfADIAZINE (SILVADENE) 1 % cream   Topical Daily Oneita Hurt, MD        Physical Findings: The patient is in no acute distress. Patient is alert and oriented.  weight is 206 lb 1.6 oz (93.486 kg). Her oral temperature is 97.7 F (36.5 C). Her blood pressure is 164/82 and her pulse is 71. Her respiration is 18 and oxygen saturation is 100%. .  She does still have a right axillary nodule which is mobile and firm and non-tender.  It seems to correlate with a post-op axillary seroma previously measuring 5 cm at the time of radiation planning CT.      No significant changes.  Impression:  The patient is recovering from the effects of radiation.  She has a resolving axillary seroma which requires ongoing attention.  Plan:  She'll see Dr. Carolynne Edouard in December and back here in 6 months.  She is planning to see Welton Flakes to discuss anti-estrogen therapy, and has a history of thromboembolic events, which may guide those choices.  _____________________________________  Artist Pais Kathrynn Running, M.D.

## 2013-09-21 NOTE — Progress Notes (Signed)
Patient reports that she continues to use SSD and radiaplex as directed. Denies breast pain at this time. Denies nipple discharge. Mild edema of right breast noted evident in orange peel appearance of upper portion above breast. Hyperpigmentation of breast noted but, no breaks in the skin or desquamation. Reports mild fatigue continues.

## 2013-09-22 ENCOUNTER — Ambulatory Visit (INDEPENDENT_AMBULATORY_CARE_PROVIDER_SITE_OTHER): Payer: Medicare Other | Admitting: Vascular Surgery

## 2013-09-22 ENCOUNTER — Other Ambulatory Visit (INDEPENDENT_AMBULATORY_CARE_PROVIDER_SITE_OTHER): Payer: Self-pay

## 2013-09-22 ENCOUNTER — Encounter: Payer: Self-pay | Admitting: Vascular Surgery

## 2013-09-22 ENCOUNTER — Ambulatory Visit (HOSPITAL_COMMUNITY)
Admission: RE | Admit: 2013-09-22 | Discharge: 2013-09-22 | Disposition: A | Discharge status: Home / Self Care | Payer: Medicare Other | Source: Ambulatory Visit | Attending: Vascular Surgery | Admitting: Vascular Surgery

## 2013-09-22 ENCOUNTER — Other Ambulatory Visit: Payer: Self-pay | Admitting: Surgery

## 2013-09-22 ENCOUNTER — Telehealth: Payer: Self-pay | Admitting: Family

## 2013-09-22 VITALS — BP 192/78 | HR 72 | Resp 18 | Ht 67.0 in | Wt 205.6 lb

## 2013-09-22 DIAGNOSIS — C50911 Malignant neoplasm of unspecified site of right female breast: Secondary | ICD-10-CM

## 2013-09-22 DIAGNOSIS — I872 Venous insufficiency (chronic) (peripheral): Secondary | ICD-10-CM

## 2013-09-22 MED ORDER — METOPROLOL SUCCINATE ER 50 MG PO TB24: 50.0000 mg | ORAL_TABLET | Freq: Every day | ORAL | Status: DC

## 2013-09-22 NOTE — Progress Notes (Signed)
[facility] [place] HISTORY AND PHYSICAL   History of Present Illness:  Patient is a 72 y.o. year old female who presents for evaluation of bilateral leg swelling with skin staining bilaterally.  The patient has a history of bilateral DVTs and pulmonary embolus 2 years ago. She does not recall any other episodes of DVT. She has no family history of hypercoagulable state. She has been on Lasix in the past to control leg swelling and currently takes [place] on as-needed basis. She has intermittently worn compression stockings in the past but had difficulty putting these on. She complains primarily of brownish discoloration of the feet and ankle region bilaterally. She did have ulceration of her lower extremity one time previously which healed up spontaneously. She is on chronic Coumadin. She has had no prior lower extremity procedures.  Other medical problems include diabetes, hypertension, breast and skin cancer. All of these are currently stable.  Past Medical History  Diagnosis Date  . Hyperplastic colon polyp   . Diabetes mellitus type II   . Osteoarthritis   . GERD (gastroesophageal reflux disease)   . Osteopenia   . Lupus     of skin see dermatologist frequently  . OA (osteoarthritis of spine)     C-spine  . DVT (deep venous thrombosis)     right leg DVT 05-17-2008  . Leg swelling     left leg 2-10, u/s showed a old clot, has a hypercoag panel,  . History of cardiovascular stress test     10/09 had CP, stress test neg, likely GI (GERD)-09/2006-had CP: stress test (-)  . 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 DVT (deep vein thrombosis) in pregnancy   . DVT of leg (deep venous thrombosis) 07/28/2011  . 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 Allergy   . Clotting disorder     hx of PE and DVT  . C. difficile diarrhea     hx of,   . Skin cancer     skin CA  . Left leg pain 03/12/2013  . Otitis externa 03/19/2013    right  . PONV (postoperative nausea and vomiting)    . Hypertension     pcp  dr Lendell Caprice   h.p   lebaur  . 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 Cholecystectomy    . Breast biopsy      left breast and right breast  . Breast lumpectomy      left breast-ductal carcinoma in situ  . Appendectomy    . Skin cancer excision      from back   . Breast lumpectomy with needle localization and axillary sentinel lymph node bx Right 06/17/2013    Procedure: BREAST LUMPECTOMY WITH NEEDLE LOCALIZATION AND AXILLARY SENTINEL LYMPH NODE BX;  Surgeon: [name], MD;  Location: [facility] OR;  Service: General;  Laterality: Right;    Social History History  Substance Use Topics  . Smoking status: Never Smoker   . Smokeless tobacco: Never Used  . Alcohol Use: No    Family History Family History  Problem Relation Age of Onset  . Diabetes Mother   . Diabetes    . Heart attack Neg Hx   . Colon cancer Neg Hx   . Breast cancer Neg Hx   .  Esophageal cancer Neg Hx   . Rectal cancer Neg Hx   . Stomach cancer Neg Hx   . Cancer Father     melanoma  . Diabetes Brother   . Other Brother     amputation    Allergies  Allergies  Allergen Reactions  . Fluconazole Other (See Comments)    REACTION: severe fatigue and muscle weakness  . Metformin Diarrhea  . Pioglitazone Other (See Comments)    REACTION: wt gain  . Vancomycin     Erythematous rash, hands and toes became cyanotic.       Current Outpatient Prescriptions  Medication Sig Dispense Refill  . B Complex-C (SUPER B COMPLEX PO) Take 1 tablet by mouth daily.      . Blood Glucose Monitoring Suppl (FREESTYLE LITE) DEVI       . Calcium Carbonate-Vitamin D (CALCIUM-VITAMIN D) 600-200 MG-UNIT CAPS Take 1 tablet by mouth daily.       . furosemide (LASIX) 20 MG tablet Take 20 mg by mouth daily as needed for fluid.      Marland Kitchen glucosamine-chondroitin  500-400 MG tablet Take 1 tablet by mouth daily.       . insulin glargine (LANTUS) 100 UNIT/ML injection Inject 40 Units into the skin at bedtime.       . insulin regular (NOVOLIN R,HUMULIN R) 100 units/mL injection Inject into the skin 3 (three) times daily before meals. Sliding scale 20-32 units      . lisinopril (PRINIVIL,ZESTRIL) 40 MG tablet Take 40 mg by mouth daily.      . niacin 500 MG tablet Take 500 mg by mouth daily.       . silver sulfADIAZINE (SILVADENE) 1 % cream Apply 1 application topically daily.      Marland Kitchen warfarin (COUMADIN) 5 MG tablet Take 5-7.5 mg by mouth daily. Take 7.5mg  on Monday thru Friday, and 5mg t on Saturday and Sunday.      . Wound Cleansers (RADIAPLEX EX) Apply topically.       No current facility-administered medications for this visit.   Facility-Administered Medications Ordered in Other Visits  Medication Dose Route Frequency Provider Last Rate Last Dose  . silver sulfADIAZINE (SILVADENE) 1 % cream   Topical Daily Oneita Hurt, MD        ROS:   General:  No weight loss, Fever, chills  HEENT: No recent headaches, no nasal bleeding, no visual changes, no sore throat  Neurologic: No dizziness, blackouts, seizures. No recent symptoms of stroke or mini- stroke. No recent episodes of slurred speech, or temporary blindness.  Cardiac: No recent episodes of chest pain/pressure, no shortness of breath at rest.  No shortness of breath with exertion.  Denies history of atrial fibrillation or irregular heartbeat  Vascular: No history of rest pain in feet.  No history of claudication.  No history of non-healing ulcer, + history of DVT   Pulmonary: No home oxygen, no productive cough, no hemoptysis,  No asthma or wheezing  Musculoskeletal:  [ x] Arthritis, [ ]  Low back pain,  [ ]  Joint pain  Hematologic:No history of hypercoagulable state.  No history of easy bleeding.  No history of anemia  Gastrointestinal: No hematochezia or melena,  No gastroesophageal  reflux, no trouble swallowing  Urinary: [ ]  chronic Kidney disease, [ ]  on HD - [ ]  MWF or [ ]  TTHS, [ ]  Burning with urination, [ ]  Frequent urination, [ ]  Difficulty urinating;   Skin: No rashes  Psychological: No history of anxiety,  No history of depression   Physical Examination  Filed Vitals:   09/22/13 1118  BP: 192/78  Pulse: 72  Resp: 18  Height: 5\' 7"  (1.702 m)  Weight: 205 lb 9.6 oz (93.26 kg)  SpO2: 98%    Body mass index is 32.19 kg/(m^2).  General:  Alert and oriented, no acute distress HEENT: Normal Neck: No bruit or JVD Pulmonary: Clear to auscultation bilaterally Cardiac: Regular Rate and Rhythm without murmur Abdomen: Soft, non-tender, non-distended, no mass, obese Skin: No rash, brawny hemosiderin staining circumferentially mid calf the foot bilaterally, no ulcers Extremity Pulses:  2+ radial, brachial, femoral, dorsalis pedis, posterior tibial pulses bilaterally Musculoskeletal: No deformity, trace edema  Neurologic: Upper and lower extremity motor 5/5 and symmetric  DATA:  The patient had a left lower extremity venous reflux exam today. 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 She did have deep vein reflux as well as superficial vein reflux and a left knee Bakers cyst. Saphenous vein diameter was 5-7 mm. I reviewed and interpreted this study.  ASSESSMENT:  Post phlebitic syndrome bilaterally. She has evidence of deep and superficial venous reflux.   PLAN:  I discussed with the patient today the possibility of laser ablation of her left greater saphenous vein for some improvement in symptoms. However I also did discuss with her that she does have deep venous reflux and also with her prior history of pulmonary embolus there may be some increased risk of DVT if she underwent a procedure. I also discussed with her wearing compression stockings or Ace wraps to both lower extremities to control her swelling symptoms. I also discussed with  her that most of the staining of her skin is most likely permanent and probably not reversible with any of the aforementioned measures. However, did discuss with her that prevention of ulceration and worsening of skin changes would be important. The mainstay of therapy is one to be compression. She has opted for compression alone at this point. She will followup with [place] in the future if she wishes to consider laser ablation. Most likely she has similar findings in the right leg although this was not scanned today. I discussed this with the patient and her daughter. She will followup on as-needed basis. Patient was given a prescription today for bilateral lower extremity compression stockings 20-30 mm mercury.  Fabienne Bruns, MD [facility] [place] Office: [phone] Pager: [phone]

## 2013-09-22 NOTE — Telephone Encounter (Signed)
Please call pt and let her know that I see her blood pressure had been very elevated at her apt with Dr. Darrick Penna and Dr. Kathrynn Running. I would like to add a generic blood pressure medication toprol xl 50mg  once daily. Keep apt on 12/8.

## 2013-09-23 NOTE — Telephone Encounter (Signed)
No hold off on rx, resume previous meds as ordered.

## 2013-09-23 NOTE — Telephone Encounter (Signed)
Notified pt of instructions below. She states that she keeps forgetting to take her blood pressure medication and had not taken for 3-4 days prior to these appts. Do we still need to send Rx below?

## 2013-09-23 NOTE — Telephone Encounter (Signed)
Left detailed message on home# and to call if any questions. 

## 2013-09-23 NOTE — Telephone Encounter (Signed)
Pt also states we can leave detailed message on home voicemail. Please advise.

## 2013-09-23 NOTE — Telephone Encounter (Signed)
Left message to return my call.  

## 2013-09-30 ENCOUNTER — Telehealth: Payer: Self-pay | Admitting: *Deleted

## 2013-09-30 ENCOUNTER — Encounter: Payer: Self-pay | Admitting: *Deleted

## 2013-09-30 NOTE — Progress Notes (Signed)
Emailed Dr. Welton Flakes for a date and time.

## 2013-09-30 NOTE — Telephone Encounter (Signed)
Left message for pt to return my call so I can schedule her w/ a Med Onc.

## 2013-09-30 NOTE — Telephone Encounter (Signed)
Pt returned my call and I offered her the appt Dr. Welton Flakes provided me with and she already has an appt w/ Dr. Carolynne Edouard that day and does not want to do two visits in the same day.  Emailed Dr. Welton Flakes for a different date and time.

## 2013-10-03 ENCOUNTER — Telehealth: Payer: Self-pay | Admitting: *Deleted

## 2013-10-03 NOTE — Telephone Encounter (Signed)
Obtained a new appt from Dr. Welton Flakes and called pt and confirmed 10/25/13 appt.  Mailed before appt letter, welcome packet & intact form to pt.  Emailed Music therapist at [employer] Dr. Welton Flakes to make them aware.  Took paperwork to Med Rec for chart.

## 2013-10-07 ENCOUNTER — Telehealth: Payer: Self-pay | Admitting: *Deleted

## 2013-10-07 NOTE — Telephone Encounter (Signed)
Pt called [facility] to cancel appt dec 9. Pt request to r/s for after the new year. pof sent

## 2013-10-09 NOTE — Progress Notes (Signed)
  Radiation Oncology         [phone] ________________________________  Name: [name] MRN: [id number]  Date: 08/26/2013  DOB: [birth year]  VIRTUAL SIMULATION NOTE  NARRATIVE:  The patient underwent simulation today for ongoing radiation therapy.  The existing CT study set was employed for the purpose of virtual treatment planning.  The target and avoidance structures were reviewed and in some cases modified.  Treatment planning then occurred.  The radiation boost prescription was entered and confirmed.  A total of one complex treatment devices were fabricated in the form of a lead block to shape radiation around the target while maximally excluding nearby critical lung and uninvolved breast.  I have requested : special port plan.   PLAN:  This modified radiation beam arrangement is intended to continue the current radiation dose to an additional 10 Gy in 5 fractions for a total cumulative dose of 60 Gy.  ------------------------------------------------  Artist Pais. Kathrynn Running, M.D.

## 2013-10-10 ENCOUNTER — Telehealth: Payer: Self-pay | Admitting: *Deleted

## 2013-10-10 ENCOUNTER — Encounter: Payer: Self-pay | Admitting: *Deleted

## 2013-10-10 ENCOUNTER — Telehealth: Payer: Self-pay | Admitting: Oncology

## 2013-10-10 ENCOUNTER — Other Ambulatory Visit: Payer: Self-pay | Admitting: *Deleted

## 2013-10-10 DIAGNOSIS — C50211 Malignant neoplasm of upper-inner quadrant of right female breast: Secondary | ICD-10-CM | POA: Insufficient documentation

## 2013-10-10 NOTE — Progress Notes (Signed)
Completed chart and gave to Dawn to enter labs and give back to me so I can give to MD. 

## 2013-10-10 NOTE — Progress Notes (Signed)
Received chart back from [facility] and put in Dr. Milta Deiters box.

## 2013-10-10 NOTE — Telephone Encounter (Signed)
Received information from [facility] stating that the pt wants to wait until the new year to be seen.  I called and confirmed 12/02/13 appt w/ pt.  There was only 10pts scheduled on Dr. Milta Deiters schedule for that day.  I mailed out a new before appt letter & intake form for the pt.  Got chart from Dr. Milta Deiters office to update.

## 2013-10-10 NOTE — Telephone Encounter (Signed)
pof for 11/21 requesting 12/9 appts be moved to after new year forwarded to [facility] in the [facility] as this is a new pt appt scheduled by Misty Stanley. Tiffany aware

## 2013-10-18 ENCOUNTER — Telehealth (INDEPENDENT_AMBULATORY_CARE_PROVIDER_SITE_OTHER): Payer: Self-pay | Admitting: *Deleted

## 2013-10-18 ENCOUNTER — Ambulatory Visit (INDEPENDENT_AMBULATORY_CARE_PROVIDER_SITE_OTHER): Payer: Medicare Other | Admitting: General Surgery

## 2013-10-18 ENCOUNTER — Encounter (INDEPENDENT_AMBULATORY_CARE_PROVIDER_SITE_OTHER): Payer: Self-pay | Admitting: General Surgery

## 2013-10-18 ENCOUNTER — Other Ambulatory Visit (INDEPENDENT_AMBULATORY_CARE_PROVIDER_SITE_OTHER): Payer: Self-pay | Admitting: General Surgery

## 2013-10-18 VITALS — BP 170/86 | HR 71 | Temp 97.1°F | Ht 66.5 in | Wt 207.8 lb

## 2013-10-18 DIAGNOSIS — Z853 Personal history of malignant neoplasm of breast: Secondary | ICD-10-CM

## 2013-10-18 DIAGNOSIS — Z9889 Other specified postprocedural states: Secondary | ICD-10-CM

## 2013-10-18 DIAGNOSIS — N644 Mastodynia: Secondary | ICD-10-CM

## 2013-10-18 NOTE — Telephone Encounter (Signed)
I spoke with pt and informed her of the appt for her MM and [place] at the breast center on 10/20/13 at 2:00pm.  Pt is aware of their location and is agreeable with this appt.

## 2013-10-20 ENCOUNTER — Other Ambulatory Visit: Payer: Medicare Other

## 2013-10-24 ENCOUNTER — Encounter: Payer: Self-pay | Admitting: Family

## 2013-10-24 ENCOUNTER — Ambulatory Visit (INDEPENDENT_AMBULATORY_CARE_PROVIDER_SITE_OTHER): Payer: Medicare Other | Admitting: Family

## 2013-10-24 VITALS — BP 172/79 | HR 71 | Temp 98.0°F | Resp 16 | Ht 66.5 in | Wt 203.1 lb

## 2013-10-24 DIAGNOSIS — C50219 Malignant neoplasm of upper-inner quadrant of unspecified female breast: Secondary | ICD-10-CM

## 2013-10-24 DIAGNOSIS — H00013 Hordeolum externum right eye, unspecified eyelid: Secondary | ICD-10-CM

## 2013-10-24 DIAGNOSIS — R51 Headache: Secondary | ICD-10-CM

## 2013-10-24 DIAGNOSIS — I10 Essential (primary) hypertension: Secondary | ICD-10-CM

## 2013-10-24 DIAGNOSIS — Z7901 Long term (current) use of anticoagulants: Secondary | ICD-10-CM

## 2013-10-24 DIAGNOSIS — H00019 Hordeolum externum unspecified eye, unspecified eyelid: Secondary | ICD-10-CM

## 2013-10-24 DIAGNOSIS — K219 Gastro-esophageal reflux disease without esophagitis: Secondary | ICD-10-CM

## 2013-10-24 DIAGNOSIS — C50211 Malignant neoplasm of upper-inner quadrant of right female breast: Secondary | ICD-10-CM

## 2013-10-24 MED ORDER — METOPROLOL SUCCINATE ER 50 MG PO TB24: 50.0000 mg | ORAL_TABLET | Freq: Every day | ORAL | Status: DC

## 2013-10-24 MED ORDER — TRAMADOL HCL 50 MG PO TABS: 50.0000 mg | ORAL_TABLET | Freq: Three times a day (TID) | ORAL | Status: DC | PRN

## 2013-10-24 NOTE — Patient Instructions (Addendum)
Start Toprol xl.  Keep upcoming appointment with Dr. Welton Flakes. Follow up with [place] in 1 month.

## 2013-10-24 NOTE — Progress Notes (Signed)
Subjective:    Patient ID: [name], female    DOB: May 19, 1941, 72 y.o.   MRN: [id number]  HPI  [name] is a 72 yr old female who presents today for follow up.  Breast CA- completed lumpectomy and radiation treatment. She reports that she has a fluid collection and the surgeon has arranged a mammogram/ultrasound.    HTN-  Follow up BP 158/78  GERD- stable with diet control only. Not on meds.    Clotting disorder- hx PE/DVT- maintained on coumadin.  Stye- Has been present x 1 week, slowly improving.  Applying warm compresses.    Urinary incontinence- reports trouble holding her urine. Has to wear depends.  Declines referral to urology.  DM2- Reports a1c 8.2 post radiation- follows with Dr. Allena Katz.    HA's- reports HA's daily.  Reports that she cannot take motrin. Tylenol does not help her headache.  Review of Systems See HPI  Past Medical History  Diagnosis Date  . Hyperplastic colon polyp   . Diabetes mellitus type II   . Osteoarthritis   . GERD (gastroesophageal reflux disease)   . Osteopenia   . Lupus     of skin see dermatologist frequently  . OA (osteoarthritis of spine)     C-spine  . DVT (deep venous thrombosis)     right leg DVT 05-17-2008  . Leg swelling     left leg 2-10, u/s showed a old clot, has a hypercoag panel,  . History of cardiovascular stress test     10/09 had CP, stress test neg, likely GI (GERD)-09/2006-had CP: stress test (-)  . 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 DVT (deep vein thrombosis) in pregnancy   . DVT of leg (deep venous thrombosis) 07/28/2011  . 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 Allergy   . Clotting disorder     hx of PE and DVT  . C. difficile diarrhea     hx of,   . Skin cancer     skin CA  . Left leg pain 03/12/2013  . Otitis externa 03/19/2013    right  . PONV (postoperative nausea and vomiting)   . Hypertension     pcp  dr Lendell Caprice   h.p   lebaur  . Breast cancer     left ductal breast ca dx  2003 approx, s/p XRt x 6 weeks, released from oncology (per  patient); dx right breast ca 2014    History   Social History  . Marital Status: Widowed    Spouse Name: N/A    Number of Children: 3  . Years of Education: N/A   Occupational History  . Retired    Social History Main Topics  . Smoking status: Never Smoker   . Smokeless tobacco: Never Used  . Alcohol Use: No  . Drug Use: No  . Sexual Activity: Not Currently   Other Topics Concern  . Not on file   Social History Narrative   Single-widow    3 children , 1 in [place] (son, daughter - [place])    [place] to [place] from [place], [place]   Alcohol Use - no      tobacco-- never     Illicit Drug Use - no    Pt is Jehovah's Witness-No blood products   Daughter - Naba Sneed           Past Surgical History  Procedure Laterality Date  . Cholecystectomy    . Breast  biopsy      left breast and right breast  . Breast lumpectomy      left breast-ductal carcinoma in situ  . Appendectomy    . Skin cancer excision      from back   . Breast lumpectomy with needle localization and axillary sentinel lymph node bx Right 06/17/2013    Procedure: BREAST LUMPECTOMY WITH NEEDLE LOCALIZATION AND AXILLARY SENTINEL LYMPH NODE BX;  Surgeon: [name], MD;  Location: [facility] OR;  Service: General;  Laterality: Right;    Family History  Problem Relation Age of Onset  . Diabetes Mother   . Diabetes    . Heart attack Neg Hx   . Colon cancer Neg Hx   . Breast cancer Neg Hx   . Esophageal cancer Neg Hx   . Rectal cancer Neg Hx   . Stomach cancer Neg Hx   . Cancer Father     melanoma  . Diabetes Brother   . Other Brother     amputation    Allergies  Allergen Reactions  . Fluconazole Other (See Comments)    REACTION: severe fatigue and muscle weakness  . Metformin Diarrhea  . Pioglitazone Other (See Comments)    REACTION: wt gain  . Vancomycin     Erythematous rash, hands and toes became cyanotic.      Current  Outpatient Prescriptions on File Prior to Visit  Medication Sig Dispense Refill  . B Complex-C (SUPER B COMPLEX PO) Take 1 tablet by mouth daily.      . Blood Glucose Monitoring Suppl (FREESTYLE LITE) DEVI       . Calcium Carbonate-Vitamin D (CALCIUM-VITAMIN D) 600-200 MG-UNIT CAPS Take 1 tablet by mouth daily.       . furosemide (LASIX) 20 MG tablet Take 20 mg by mouth daily as needed for fluid.      Marland Kitchen glucosamine-chondroitin 500-400 MG tablet Take 1 tablet by mouth daily.       . Glucose Blood (FREESTYLE TEST VI)       . insulin glargine (LANTUS) 100 UNIT/ML injection Inject 48 Units into the skin at bedtime.       . insulin regular (NOVOLIN R,HUMULIN R) 100 units/mL injection Inject into the skin 3 (three) times daily before meals. Sliding scale 20-32 units      . lisinopril (PRINIVIL,ZESTRIL) 40 MG tablet Take 40 mg by mouth daily.      . niacin 500 MG tablet Take 500 mg by mouth daily.       . silver sulfADIAZINE (SILVADENE) 1 % cream Apply 1 application topically daily.      Marland Kitchen warfarin (COUMADIN) 5 MG tablet Take 5-7.5 mg by mouth daily. Take 7.5mg  on Monday thru Friday, and 5mg t on Saturday and Sunday.      . Wound Cleansers (RADIAPLEX EX) Apply topically.       Current Facility-Administered Medications on File Prior to Visit  Medication Dose Route Frequency Provider Last Rate Last Dose  . silver sulfADIAZINE (SILVADENE) 1 % cream   Topical Daily Oneita Hurt, MD        BP 172/79  Pulse 71  Temp(Src) 98 F (36.7 C) (Oral)  Resp 16  Ht 5' 6.5" (1.689 m)  Wt 203 lb 1.9 oz (92.135 kg)  BMI 32.30 kg/m2       Objective:   Physical Exam  Constitutional: She is oriented to person, place, and time. She appears well-developed and well-nourished. No distress.  HENT:  Head: Normocephalic  and atraumatic.    Small stye right upper inner eyelid  Cardiovascular: Normal rate and regular rhythm.   No murmur heard. Pulmonary/Chest: Effort normal and breath sounds normal. No  respiratory distress. She has no wheezes. She has no rales. She exhibits no tenderness.  Neurological: She is alert and oriented to person, place, and time.  Psychiatric: She has a normal mood and affect. Her behavior is normal. Judgment and thought content normal.          Assessment & Plan:

## 2013-10-25 ENCOUNTER — Ambulatory Visit: Payer: Medicare Other

## 2013-10-25 ENCOUNTER — Ambulatory Visit: Payer: Medicare Other | Admitting: Oncology

## 2013-10-25 ENCOUNTER — Other Ambulatory Visit: Payer: Medicare Other | Admitting: Lab

## 2013-10-26 ENCOUNTER — Other Ambulatory Visit: Payer: Medicare Other

## 2013-10-27 ENCOUNTER — Other Ambulatory Visit: Payer: Medicare Other

## 2013-10-27 DIAGNOSIS — R51 Headache: Secondary | ICD-10-CM | POA: Insufficient documentation

## 2013-10-27 DIAGNOSIS — H00019 Hordeolum externum unspecified eye, unspecified eyelid: Secondary | ICD-10-CM | POA: Insufficient documentation

## 2013-10-27 DIAGNOSIS — R519 Headache, unspecified: Secondary | ICD-10-CM | POA: Insufficient documentation

## 2013-10-27 NOTE — Assessment & Plan Note (Signed)
Sparing use of tramadol recommended.

## 2013-10-27 NOTE — Assessment & Plan Note (Signed)
BP remains uncontrolled. Add toprol xl. Follow up in 1 month.

## 2013-10-27 NOTE — Assessment & Plan Note (Signed)
Improving. Continue warm compresses.

## 2013-10-27 NOTE — Assessment & Plan Note (Signed)
Last A1C elevated.  Management per endo.

## 2013-10-27 NOTE — Assessment & Plan Note (Signed)
Continues coumadin.  Management per coumadin clinic.

## 2013-10-27 NOTE — Assessment & Plan Note (Signed)
Stable with diet control

## 2013-10-28 ENCOUNTER — Encounter: Payer: Self-pay | Admitting: Family

## 2013-11-28 ENCOUNTER — Encounter: Payer: Self-pay | Admitting: Family

## 2013-11-28 ENCOUNTER — Ambulatory Visit (INDEPENDENT_AMBULATORY_CARE_PROVIDER_SITE_OTHER): Payer: Medicare Other | Admitting: Family

## 2013-11-28 VITALS — BP 138/74 | HR 78 | Temp 98.2°F | Resp 16 | Ht 66.5 in | Wt 206.0 lb

## 2013-11-28 DIAGNOSIS — I10 Essential (primary) hypertension: Secondary | ICD-10-CM

## 2013-11-28 DIAGNOSIS — H00019 Hordeolum externum unspecified eye, unspecified eyelid: Secondary | ICD-10-CM

## 2013-11-28 DIAGNOSIS — M549 Dorsalgia, unspecified: Secondary | ICD-10-CM

## 2013-11-28 DIAGNOSIS — N3281 Overactive bladder: Secondary | ICD-10-CM

## 2013-11-28 DIAGNOSIS — IMO0001 Reserved for inherently not codable concepts without codable children: Secondary | ICD-10-CM

## 2013-11-28 DIAGNOSIS — L304 Erythema intertrigo: Secondary | ICD-10-CM

## 2013-11-28 DIAGNOSIS — H00013 Hordeolum externum right eye, unspecified eyelid: Secondary | ICD-10-CM

## 2013-11-28 DIAGNOSIS — E1165 Type 2 diabetes mellitus with hyperglycemia: Secondary | ICD-10-CM

## 2013-11-28 DIAGNOSIS — N318 Other neuromuscular dysfunction of bladder: Secondary | ICD-10-CM

## 2013-11-28 DIAGNOSIS — L538 Other specified erythematous conditions: Secondary | ICD-10-CM

## 2013-11-28 DIAGNOSIS — R51 Headache: Secondary | ICD-10-CM

## 2013-11-28 MED ORDER — NYSTATIN 100000 UNIT/GM EX POWD: CUTANEOUS | Status: DC

## 2013-11-28 MED ORDER — INSULIN GLARGINE 100 UNIT/ML ~~LOC~~ SOLN: 40.0000 [IU] | Freq: Every day | SUBCUTANEOUS | Status: DC

## 2013-11-28 NOTE — Progress Notes (Signed)
Pre visit review using our clinic review tool, if applicable. No additional management support is needed unless otherwise documented below in the visit note. 

## 2013-11-28 NOTE — Assessment & Plan Note (Signed)
BP improved today, but had elevated readings with Dr. Marlou Starks and Dr. Posey Pronto.  Patient was to start Toprol from last visit, patient reports she has not started.  Start Toprol XL 50mg  1/2 tablet daily. Continue Lisinopril. Recommended patient to purchase BP machine to help monitor readings at home. Follow up in one month.

## 2013-11-28 NOTE — Progress Notes (Signed)
Subjective:    Patient ID: [name], female    DOB: 02-20-1941, 73 y.o.   MRN: [id number]  HPI [name] is a 73 year old female who presents today for follow up.  HTN- Patient reports compliance to lisinopril. Patient reports she does not take metoprolol that was prescribed during her last visit. BP Readings from Last 3 Encounters:  11/28/13 138/74  10/24/13 172/79  10/18/13 170/86    Stye- Improved, patient reports stye is nearly gone. Denies itching and pain.  Urinary incontinence- Patient reports no change in urinary incontinence since last visit. Patient continues to wear depends and reports rash to bikini line. Is interested in starting a medication for incontinence.  Diabetes- Patient follows with [name] and reports HgA1c is decreasing since radiation therapy.  Patient checking blood sugar four times daily before meals.  Headaches- Patient reports Tramadol is helping to decrease headaches and takes Tramadol once weekly.  Denies dizziness, nausea, photosensitivity.  Back pain- Patient reports chronic back pain with worsening pain gradually over the past year. Patient reports taking Tylenol with temporary relief. Patient reports pain has worsened to the extent that she cannot bend over or down. Patient requesting back support brace.   Review of Systems  Constitutional: Positive for fatigue.       Patient has been fatigued since completion of radiation.  HENT: Negative for rhinorrhea.   Eyes: Negative for photophobia.  Respiratory: Negative for cough.   Cardiovascular: Negative for chest pain.  Gastrointestinal: Negative for nausea.  Genitourinary: Positive for dysuria. Negative for flank pain.       Urinary incontinence, wears depends.  Dysuria x 2 days.  Musculoskeletal: Positive for back pain.       Chronic back pain that has worsened.  Neurological: Positive for headaches. Negative for dizziness.  Psychiatric/Behavioral: Negative for sleep  disturbance.   Past Medical History  Diagnosis Date  . Hyperplastic colon polyp   . Diabetes mellitus type II   . Osteoarthritis   . GERD (gastroesophageal reflux disease)   . Osteopenia   . Lupus     of skin see dermatologist frequently  . OA (osteoarthritis of spine)     C-spine  . DVT (deep venous thrombosis)     right leg DVT 05-17-2008  . Leg swelling     left leg 2-10, u/s showed a old clot, has a hypercoag panel,  . History of cardiovascular stress test     10/09 had CP, stress test neg, likely GI (GERD)-09/2006-had CP: stress test (-)  . 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 DVT (deep vein thrombosis) in pregnancy   . DVT of leg (deep venous thrombosis) 07/28/2011  . 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 Allergy   . Clotting disorder     hx of PE and DVT  . C. difficile diarrhea     hx of,   . Skin cancer     skin CA  . Left leg pain 03/12/2013  . Otitis externa 03/19/2013    right  . PONV (postoperative nausea and vomiting)   . Hypertension     pcp  dr [name]   h.p   lebaur  . 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 Marital Status: Widowed    Spouse Name: N/A  Number of Children: 3  . Years of Education: N/A   Occupational History  . Retired    Social History Main Topics  . Smoking status: Never Smoker   . Smokeless tobacco: Never Used  . Alcohol Use: No  . Drug Use: No  . Sexual Activity: Not Currently   Other Topics Concern  . Not on file   Social History Narrative   Single-widow    3 children , 1 in [place] (son, Kelsey - [place])    [place] to [place] from [place], [place]   Alcohol Use - no      tobacco-- never     Illicit Drug Use - no    Pt is Jehovah's Witness-No blood products   [name]           Past Surgical History  Procedure Laterality Date  . Cholecystectomy    . Breast  biopsy      left breast and right breast  . Breast lumpectomy      left breast-ductal carcinoma in situ  . Appendectomy    . Skin cancer excision      from back   . Breast lumpectomy with needle localization and axillary sentinel lymph node bx Right 06/17/2013    Procedure: BREAST LUMPECTOMY WITH NEEDLE LOCALIZATION AND AXILLARY SENTINEL LYMPH NODE BX;  Surgeon: Kelsey Roof, MD;  Location: [facility];  Service: General;  Laterality: Right;    Family History  Problem Relation Age of Onset  . Diabetes Mother   . Diabetes    . Heart attack Neg Hx   . Colon cancer Neg Hx   . Breast cancer Neg Hx   . Esophageal cancer Neg Hx   . Rectal cancer Neg Hx   . Stomach cancer Neg Hx   . Cancer Father     melanoma  . Diabetes Brother   . Other Brother     amputation    Allergies  Allergen Reactions  . Fluconazole Other (See Comments)    REACTION: severe fatigue and muscle weakness  . Metformin Diarrhea  . Pioglitazone Other (See Comments)    REACTION: wt gain  . Vancomycin     Erythematous rash, hands and toes became cyanotic.      Current Outpatient Prescriptions on File Prior to Visit  Medication Sig Dispense Refill  . B Complex-C (SUPER B COMPLEX PO) Take 1 tablet by mouth daily.      . Blood Glucose Monitoring Suppl (FREESTYLE LITE) DEVI       . Calcium Carbonate-Vitamin D (CALCIUM-VITAMIN D) 600-200 MG-UNIT CAPS Take 1 tablet by mouth daily.       . furosemide (LASIX) 20 MG tablet Take 20 mg by mouth daily as needed for fluid.      Marland Kitchen glucosamine-chondroitin 500-400 MG tablet Take 1 tablet by mouth daily.       . insulin regular (NOVOLIN R,HUMULIN R) 100 units/mL injection Inject into the skin 3 (three) times daily before meals. Sliding scale 20-32 units      . lisinopril (PRINIVIL,ZESTRIL) 40 MG tablet Take 40 mg by mouth daily.      . niacin 500 MG tablet Take 500 mg by mouth daily.       . silver sulfADIAZINE (SILVADENE) 1 % cream Apply 1 application topically daily.      .  traMADol (ULTRAM) 50 MG tablet Take 1 tablet (50 mg total) by mouth every 8 (eight) hours as needed.  30 tablet  0  . warfarin (COUMADIN) 5 MG tablet Take 5-7.5 mg by mouth daily. Take 7.5mg  on Monday thru Friday, and 5mg t on Saturday and Sunday.      . Wound Cleansers (RADIAPLEX EX) Apply topically.       Current Facility-Administered Medications on File Prior to Visit  Medication Dose Route Frequency Provider Last Rate Last Dose  . silver sulfADIAZINE (SILVADENE) 1 % cream   Topical Daily Lora Paula, MD        BP 138/74  Pulse 78  Temp(Src) 98.2 F (36.8 C) (Oral)  Resp 16  Ht 5' 6.5" (1.689 m)  Wt 206 lb (93.441 kg)  BMI 32.76 kg/m2  SpO2 99%       Objective:   Physical Exam  Constitutional: She is oriented to person, place, and time. She appears well-nourished.  HENT:  Head: Normocephalic and atraumatic.  Eyes: EOM are normal. Pupils are equal, round, and reactive to light.  Cardiovascular: Normal rate and normal heart sounds.   Pulmonary/Chest: Effort normal and breath sounds normal.  Musculoskeletal: She exhibits edema.  1+ pitting present to left ankle.   Neurological: She is alert and oriented to person, place, and time. She has normal strength. Coordination and gait normal. GCS eye subscore is 4. GCS verbal subscore is 5. GCS motor subscore is 6.  Skin: Skin is warm and dry.  Erythematous rash in groin noted.   Psychiatric: She has a normal mood and affect.          Assessment & Plan:

## 2013-11-28 NOTE — Assessment & Plan Note (Signed)
Resolved

## 2013-11-28 NOTE — Assessment & Plan Note (Signed)
Yeast present to bilateral groin folds. Prescribed Nystatin Powder. Follow up as needed.

## 2013-11-28 NOTE — Assessment & Plan Note (Signed)
Unchanged. Patient declined medication due to cost and potential side effects. Patient declined referral to urology. Follow up as needed.

## 2013-11-28 NOTE — Assessment & Plan Note (Signed)
Last A1C elevated. Continue to manage with Dr. Posey Pronto.

## 2013-11-28 NOTE — Patient Instructions (Addendum)
Start Toprol XL 50mg . Take 1/2 tablet daily. Apply Nystatin Powder to groin area twice daily. Follow up in one month.

## 2013-11-28 NOTE — Assessment & Plan Note (Addendum)
Stable. Patient reports occasional use of Tramadol as needed. Follow up as needed.

## 2013-11-28 NOTE — Assessment & Plan Note (Addendum)
Continue Tramadol. Advised pt I do not think she will get a lot of benefit from a back brace.

## 2013-11-29 ENCOUNTER — Telehealth: Payer: Self-pay | Admitting: *Deleted

## 2013-11-29 ENCOUNTER — Telehealth: Payer: Self-pay

## 2013-11-29 MED ORDER — METOPROLOL SUCCINATE ER 50 MG PO TB24: ORAL_TABLET | ORAL | Status: DC

## 2013-11-29 NOTE — Telephone Encounter (Signed)
Received a voicemail from pts daughter Jewel) stating that they are in need of rescheduling her appt w/ Dr. Humphrey Rolls on Friday, 1/16 due to not being able to get off work.  Called and spoke with pts daughter and pt was beside her and informed them both that I will need to get with Dr. Humphrey Rolls for a new appt date and time and then I would be able to contact them back.  They was fine with that.  Took paperwork to Dr. Humphrey Rolls for a new appt date and time.

## 2013-11-29 NOTE — Telephone Encounter (Signed)
Received message from [name] and pt requesting clarification of metoprolol directions. Pharmacy has rx for 50mg  1 tablet daily. Per 11/28/13 office note pt was instructed to take 1/2 tablet daily. Notified pt and left detailed message on pharmacy voicemail for 50mg  1/2 tablet daily, #15 x 2 refills.

## 2013-11-29 NOTE — Telephone Encounter (Signed)
Received appt date and time back from Dr. Humphrey Rolls.  Called and left a message for pts daughter Riesen) to call me back so we can get her mom rescheduled.

## 2013-11-29 NOTE — Telephone Encounter (Signed)
Relevant patient education mailed to patient.  

## 2013-12-01 ENCOUNTER — Telehealth: Payer: Self-pay | Admitting: *Deleted

## 2013-12-01 NOTE — Telephone Encounter (Signed)
Called and spoke with pt and confirmed rescheduled appt for 12/12/13.  Unable to reschedule Dr. Laurelyn Sickle appt, so I cancelled the original and emailed Audie Clear to enter it for me.  Emailed Arts development officer at [employer] to Make aware.  Updated chart and placed back in Dr. Laurelyn Sickle box.

## 2013-12-02 ENCOUNTER — Ambulatory Visit: Payer: Medicare Other

## 2013-12-02 ENCOUNTER — Other Ambulatory Visit: Payer: Medicare Other

## 2013-12-02 ENCOUNTER — Ambulatory Visit: Payer: Medicare Other | Admitting: Oncology

## 2013-12-12 ENCOUNTER — Encounter: Payer: Self-pay | Admitting: Oncology

## 2013-12-12 ENCOUNTER — Ambulatory Visit: Payer: Medicare Other

## 2013-12-12 ENCOUNTER — Other Ambulatory Visit (HOSPITAL_BASED_OUTPATIENT_CLINIC_OR_DEPARTMENT_OTHER): Payer: Medicare Other

## 2013-12-12 ENCOUNTER — Ambulatory Visit (HOSPITAL_BASED_OUTPATIENT_CLINIC_OR_DEPARTMENT_OTHER): Payer: Medicare Other | Admitting: Oncology

## 2013-12-12 VITALS — BP 205/66 | HR 64 | Temp 98.3°F | Resp 19 | Ht 66.0 in | Wt 208.2 lb

## 2013-12-12 DIAGNOSIS — C50219 Malignant neoplasm of upper-inner quadrant of unspecified female breast: Secondary | ICD-10-CM

## 2013-12-12 DIAGNOSIS — E119 Type 2 diabetes mellitus without complications: Secondary | ICD-10-CM

## 2013-12-12 DIAGNOSIS — C50211 Malignant neoplasm of upper-inner quadrant of right female breast: Secondary | ICD-10-CM

## 2013-12-12 DIAGNOSIS — D0590 Unspecified type of carcinoma in situ of unspecified breast: Secondary | ICD-10-CM

## 2013-12-12 DIAGNOSIS — M899 Disorder of bone, unspecified: Secondary | ICD-10-CM

## 2013-12-12 DIAGNOSIS — M949 Disorder of cartilage, unspecified: Secondary | ICD-10-CM

## 2013-12-12 DIAGNOSIS — Z7901 Long term (current) use of anticoagulants: Secondary | ICD-10-CM

## 2013-12-12 LAB — COMPREHENSIVE METABOLIC PANEL (CC13)
ALT: 31 U/L (ref 0–55)
AST: 28 U/L (ref 5–34)
Albumin: 3.4 g/dL — ABNORMAL LOW (ref 3.5–5.0)
Alkaline Phosphatase: 92 U/L (ref 40–150)
Anion Gap: 9 mEq/L (ref 3–11)
BILIRUBIN TOTAL: 0.6 mg/dL (ref 0.20–1.20)
BUN: 11 mg/dL (ref 7.0–26.0)
CO2: 27 mEq/L (ref 22–29)
Calcium: 9.3 mg/dL (ref 8.4–10.4)
Chloride: 105 mEq/L (ref 98–109)
Creatinine: 0.9 mg/dL (ref 0.6–1.1)
Glucose: 228 mg/dl — ABNORMAL HIGH (ref 70–140)
Potassium: 4.2 mEq/L (ref 3.5–5.1)
SODIUM: 141 meq/L (ref 136–145)
TOTAL PROTEIN: 6.8 g/dL (ref 6.4–8.3)

## 2013-12-12 LAB — CBC WITH DIFFERENTIAL/PLATELET
BASO%: 0.7 % (ref 0.0–2.0)
Basophils Absolute: 0 10*3/uL (ref 0.0–0.1)
EOS%: 1.8 % (ref 0.0–7.0)
Eosinophils Absolute: 0.1 10*3/uL (ref 0.0–0.5)
HCT: 37.5 % (ref 34.8–46.6)
HGB: 12.2 g/dL (ref 11.6–15.9)
LYMPH%: 18 % (ref 14.0–49.7)
MCH: 27.2 pg (ref 25.1–34.0)
MCHC: 32.5 g/dL (ref 31.5–36.0)
MCV: 83.8 fL (ref 79.5–101.0)
MONO#: 0.5 10*3/uL (ref 0.1–0.9)
MONO%: 8.7 % (ref 0.0–14.0)
NEUT#: 4.1 10*3/uL (ref 1.5–6.5)
NEUT%: 70.8 % (ref 38.4–76.8)
PLATELETS: 163 10*3/uL (ref 145–400)
RBC: 4.48 10*6/uL (ref 3.70–5.45)
RDW: 13.7 % (ref 11.2–14.5)
WBC: 5.8 10*3/uL (ref 3.9–10.3)
lymph#: 1 10*3/uL (ref 0.9–3.3)

## 2013-12-12 MED ORDER — ANASTROZOLE 1 MG PO TABS: 1.0000 mg | ORAL_TABLET | Freq: Every day | ORAL | Status: AC

## 2013-12-12 NOTE — Progress Notes (Signed)
[name] [id number] 10/19/1941 73 y.o. 12/12/2013 4:16 PM  CC  Nance Pear., NP [address] [place] [address]  REASON FOR CONSULTATION:  73 year old female with prior history of DCIS of the left breast and new right breast cancer. Patient is status post lumpectomy and radiation now seen in medical oncology for discussion of adjuvant treatment options.  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 Autumn Messing  HISTORY OF PRESENT ILLNESS:  [name] is a 73 y.o. female.  With a prior history of left breast DCIS originally diagnosed in 2004. Most recently she had a screening mammogram done in October 2013 she was noted to have calcifications in the right breast. Magnification views showed new cluster of linear calcifications in the medial aspect of the right breast. Also noted were small clustered punctate calcifications in the right upper inner quadrant posteriorly. She was suggested to have stereotactic biopsy performed. This was done on 09/29/2012. The final pathology revealed vascular calcifications with no malignancy. The second cluster of round calcifications were not biopsied at that time. She had a subsequent followup diagnostic mammogram months later. She had ultrasound done on 04/22/2013. This revealed increase in the calcifications measuring 6 mm. She was also noted to have a small hypoechoic mass on ultrasound  measuring 1 cm. Patient at that time refused stereotactic guided biopsy. On 05/05/2013 patient had ultrasound-guided core needle biopsy. The pathology revealed invasive ductal carcinoma that was ER positive PR positive. On 05/10/2013 she had bilateral MRIs performed of the breasts. The MRI showed a new area of concern within the right breast and needle core biopsy was performed on July 3 that was benign. 06/17/2013 patient had a lumpectomy with sentinel lymph node biopsy. Under lumpectomy there were noted to be 2 areas. One measuring 1.6 cm foci of intermediate grade DCIS with a 2 mm lateral margin. The medial lesion contains 4 mm focus of invasive ductal carcinoma 2 sentinel nodes were negative for metastatic disease. Patient was seen by Dr. Tyler Pita. She underwent radiation therapy adjuvantly which he completed on 09/09/2013. She was also referred to medical oncology but patient initially declined. She is since then changed her mind and she is seen today for initial consultation. Overall patient is without any complaints. She is accompanied by her daughter. I have reviewed her pathology and radiology.  Medical History: Past Medical History  Diagnosis Date  . Hyperplastic colon polyp   . Diabetes mellitus type II   . Osteoarthritis   . GERD (gastroesophageal reflux disease)   . Osteopenia   . Lupus     of skin see dermatologist frequently  . OA (osteoarthritis of spine)     C-spine  . DVT (deep venous thrombosis)     right leg DVT 05-17-2008  . Leg swelling     left leg 2-10, u/s showed a  old clot, has a hypercoag panel,  . History of cardiovascular stress test     10/09 had CP, stress test neg, likely GI (GERD)-09/2006-had CP: stress test (-)  . 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 DVT (deep vein thrombosis) in pregnancy   . DVT of leg (deep venous thrombosis) 07/28/2011  . 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 Allergy   . Clotting disorder     hx of PE and DVT  . C.  difficile diarrhea     hx of,   . Skin cancer     skin CA  . Left leg pain 03/12/2013  . Otitis externa 03/19/2013    right  . PONV (postoperative nausea and vomiting)   . Hypertension     pcp  dr Conley Canal   h.p   lebaur  . 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 Cholecystectomy    . Breast biopsy      left breast and right breast  . Breast lumpectomy      left breast-ductal carcinoma in situ  . Appendectomy    . Skin cancer excision      from back   . Breast lumpectomy with needle localization and axillary sentinel lymph node bx Right 06/17/2013    Procedure: BREAST LUMPECTOMY WITH NEEDLE LOCALIZATION AND AXILLARY SENTINEL LYMPH NODE BX;  Surgeon: Merrie Roof, MD;  Location: [facility];  Service: General;  Laterality: Right;    Family History: Family History  Problem Relation Age of Onset  . Diabetes Mother   . Diabetes    . Heart attack Neg Hx   . Colon cancer Neg Hx   . Breast cancer Neg Hx   . Esophageal cancer Neg Hx   . Rectal cancer Neg Hx   . Stomach cancer Neg Hx   . Cancer Father     melanoma  . Diabetes Brother   . Other Brother     amputation    Social History History  Substance Use Topics  . Smoking status: Never Smoker   . Smokeless tobacco: Never Used  . Alcohol Use: No    Allergies: Allergies  Allergen Reactions  . Fluconazole Other (See Comments)    REACTION: severe fatigue and muscle weakness  . Metformin Diarrhea  . Pioglitazone Other (See Comments)    REACTION: wt gain  . Vancomycin     Erythematous rash, hands and toes became cyanotic.      Current Medications: Current Outpatient Prescriptions  Medication Sig Dispense Refill  . B Complex-C (SUPER B COMPLEX PO) Take 1 tablet by mouth daily.      . Blood Glucose Monitoring Suppl (FREESTYLE LITE) DEVI       .  Calcium Carbonate-Vitamin D (CALCIUM-VITAMIN D) 600-200 MG-UNIT CAPS Take 1 tablet by mouth daily.       Marland Kitchen FREESTYLE LITE test strip Use to check blood sugar 4 times a day.      . furosemide (LASIX) 20 MG tablet Take 20 mg by mouth daily as needed for fluid.      Marland Kitchen glucosamine-chondroitin 500-400 MG tablet Take 1 tablet by mouth daily.       . insulin glargine (LANTUS) 100 UNIT/ML injection Inject 0.4 mLs (40 Units total) into the skin at bedtime.  3 mL  0  . insulin regular (NOVOLIN R,HUMULIN R) 100 units/mL injection Inject into the skin 3 (three) times daily before meals. Sliding scale 20-32 units      . lisinopril (PRINIVIL,ZESTRIL) 40 MG tablet Take 40 mg by mouth daily.      . metoprolol succinate (TOPROL-XL) 50 MG 24 hr tablet Take 1/2 tablet daily with or immediately following a meal.  15 tablet  2  . niacin 500 MG tablet Take 500 mg by mouth daily.       Marland Kitchen nystatin (MYCOSTATIN/NYSTOP) 100000 UNIT/GM POWD Apply powder to rash in groin twice daily as needed.  30 g  2  . silver sulfADIAZINE (SILVADENE) 1 % cream Apply 1 application topically daily.      . traMADol (ULTRAM) 50 MG tablet Take 1 tablet (50 mg total) by mouth every 8 (eight) hours as needed.  30 tablet  0  . warfarin (COUMADIN) 5 MG tablet Take 5-7.5 mg by mouth daily. Take 7.41m on Monday thru Friday, and 547m on Saturday and Sunday.      . Wound Cleansers (RADIAPLEX EX) Apply topically.       No current facility-administered medications for this visit.   Facility-Administered Medications Ordered in Other Visits  Medication Dose Route Frequency Provider Last Rate Last Dose  . silver sulfADIAZINE (SILVADENE) 1 % cream   Topical Daily MaLora PaulaMD        OB/GYN History: menarche at [age] at first child at [age], No HRT  Fertility Discussion: n/a Prior History of Cancer: yes 2004 DCIS left breast  Health Maintenance:  Colonoscopy yes Bone Density yes date unknown Last PAP smear no  ECOG  PERFORMANCE STATUS: 1 - Symptomatic but completely ambulatory  Genetic Counseling/testing: NO  REVIEW OF SYSTEMS:  Comprehensive 14 point review of systems was obtained and it is down separately into the electronic medical record  PHYSICAL EXAMINATION: Blood pressure 205/66, pulse 64, temperature 98.3 F (36.8 C), temperature source Oral, resp. rate 19, height 5' 6" (1.676 m), weight 208 lb 3.2 oz (94.439 kg).  RAWLS:LHTDShealthy, no distress, well nourished and well developed SKIN: skin color, texture, turgor are normal HEAD: Normocephalic EYES: PERRLA, EOMI EARS: External ears normal OROPHARYNX:no exudate and no erythema  NECK: no adenopathy LYMPH:  no palpable lymphadenopathy BREAST:right breast normal without mass, skin or nipple changes or axillary nodes, surgical scars noted in the right breast well-healed, left breast without masses nipple or skin changes or axillary nodes.  LUNGS: clear to auscultation and percussion HEART: regular rate & rhythm ABDOMEN:abdomen soft, non-tender, obese, normal bowel sounds and no masses or organomegaly BACK: Back symmetric, no curvature., No CVA tenderness EXTREMITIES:no edema, no clubbing, no cyanosis  NEURO: alert & oriented x 3 with fluent speech, no focal motor/sensory deficits, gait normal     STUDIES/RESULTS: No results found.   LABS:    Chemistry      Component Value Date/Time   NA 141 12/12/2013 1537   NA 141 07/22/2013 0821   K 4.2 12/12/2013 1537   K 3.8 07/22/2013 0821   CL 105 07/22/2013 0821   CO2 27 12/12/2013 1537   CO2 26 07/22/2013 0821   BUN 11.0 12/12/2013 1537   BUN 12 07/22/2013 0821   CREATININE 0.9 12/12/2013 1537   CREATININE 0.77 07/22/2013 0821   CREATININE 0.71 06/09/2013 1117      Component Value Date/Time   CALCIUM 9.3 12/12/2013 1537   CALCIUM 9.1 07/22/2013  7322   ALKPHOS 92 12/12/2013 1537   ALKPHOS 91 07/22/2013 0821   AST 28 12/12/2013 1537   AST 24 07/22/2013 0821   ALT 31 12/12/2013 1537   ALT 26 07/22/2013  0821   BILITOT 0.60 12/12/2013 1537   BILITOT 0.5 07/22/2013 0821      Lab Results  Component Value Date   WBC 5.8 12/12/2013   HGB 12.2 12/12/2013   HCT 37.5 12/12/2013   MCV 83.8 12/12/2013   PLT 163 12/12/2013     ASSESSMENT/PLAN: 73 year old female with  #1 new diagnosis of stage I (T1 A. N0 M0) ER-positive invasive ductal carcinoma of the right breast originally diagnosed in June 2014. Patient is status post lumpectomy with sentinel lymph node biopsy on 06/17/2013 for a 4 mm focus of invasive ductal carcinoma. 2 sentinel nodes were negative for metastatic disease. Tumor was ER positive PR positive HER-2/neu negative. Postoperatively patient has done well. She has had radiation therapy administered by Dr. Tyler Pita to the right breast. He completed this in October 2014. She tolerated it well.  #2 patient with prior history of DCIS of the left breast status post lumpectomy followed by adjuvant radiation therapy. She tolerated that well as well.  #3 patient and I discussed role of adjuvant antiestrogen therapy. I do not think that she needs chemotherapy. She was very relieved to hear that. We discussed different treatment options including aromatase inhibitors as well as tamoxifen. I would not use tamoxifen in this individual do to her history of having deep venous thromboses. But certainly we could use a aromatase inhibitor such as Arimidex. We discussed rationale risks benefits and side effects of Arimidex. At the end of my extensive discussion she was very agreeable to trying the medication. A prescription was sent to her to her pharmacy.  #4 history of melanoma in situ of the back 06/23/2011.  #5 history of DVT in pregnancy and 07/28/2011 as well as pulmonary embolism and infarction in September 2012. Continue to monitor.  #6 patient will be seen back in 3 months time for followup. She knows to call sooner if need arises.     Thank you so much for allowing me to participate in the  care of [name]. I will continue to follow up the patient with you and assist in her care.  All questions were answered. The patient knows to call the clinic with any problems, questions or concerns. We can certainly see the patient much sooner if necessary.  I spent 55 minutes counseling the patient face to face. The total time spent in the appointment was 60 minutes.  Marcy Panning, MD Medical/Oncology [facility] [phone] (beeper) [phone] (Office)  12/12/2013, 4:16 PM

## 2013-12-12 NOTE — Patient Instructions (Signed)

## 2013-12-13 ENCOUNTER — Telehealth: Payer: Self-pay | Admitting: *Deleted

## 2013-12-13 NOTE — Telephone Encounter (Signed)
Lm gv appt for 03/17/14 w/ labs@ 2:30p and ov@ 3pm. Made pt aware that i will mail a letter/avs...td

## 2013-12-19 ENCOUNTER — Ambulatory Visit: Payer: Medicare Other | Admitting: Family

## 2013-12-20 ENCOUNTER — Telehealth: Payer: Self-pay | Admitting: Family

## 2013-12-20 NOTE — Progress Notes (Signed)
Subjective:     Patient ID: [name], female   DOB: 03-15-1941, 73 y.o.   MRN: [id number]  HPI The patient is a 73 year old female who is 4 months status post right lumpectomy and negative sentinel node biopsy for a T1 A. N0 right breast cancer. She was ER and PR positive and HER-2/neu negative. She tolerated the surgery well. She has finished radiation therapy. Since her last visit she fell about a month ago and landed on her right side. She continues to have some soreness in that area.  Review of Systems  Constitutional: Negative.   HENT: Negative.   Eyes: Negative.   Respiratory: Negative.   Cardiovascular: Positive for chest pain.  Gastrointestinal: Negative.   Endocrine: Negative.   Genitourinary: Negative.   Musculoskeletal: Negative.   Skin: Negative.   Allergic/Immunologic: Negative.   Neurological: Negative.   Hematological: Negative.   Psychiatric/Behavioral: Negative.        Objective:   Physical Exam  Constitutional: She is oriented to person, place, and time. She appears well-developed and well-nourished.  HENT:  Head: Normocephalic and atraumatic.  Eyes: Conjunctivae and EOM are normal. Pupils are equal, round, and reactive to light.  Neck: Normal range of motion. Neck supple.  Cardiovascular: Normal rate, regular rhythm and normal heart sounds.   Pulmonary/Chest: Effort normal and breath sounds normal.  There is some fullness to her scar on the right breast secondary to radiation. Otherwise there is no palpable mass in either breast. There is no palpable axillary, subclavicular, or cervical lymphadenopathy  Abdominal: Soft. Bowel sounds are normal.  Musculoskeletal: Normal range of motion.  Lymphadenopathy:    She has no cervical adenopathy.  Neurological: She is alert and oriented to person, place, and time.  Skin: Skin is warm and dry.  Psychiatric: She has a normal mood and affect. Her behavior is normal.       Assessment:     The patient is 4  months status post right lumpectomy for breast cancer     Plan:     At this point she will continue to do regular self exams. She will follow up with medical oncology to consider adjuvant therapy. We will plan to see her back in about 3 months.

## 2013-12-20 NOTE — Telephone Encounter (Signed)
Relevant patient education mailed to patient.  

## 2014-01-06 ENCOUNTER — Ambulatory Visit: Payer: Medicare Other | Admitting: Family

## 2014-01-06 ENCOUNTER — Encounter: Payer: Self-pay | Admitting: Family

## 2014-01-06 ENCOUNTER — Ambulatory Visit (INDEPENDENT_AMBULATORY_CARE_PROVIDER_SITE_OTHER): Payer: Medicare Other | Admitting: Family

## 2014-01-06 VITALS — BP 148/70 | HR 67 | Temp 98.2°F | Resp 18 | Ht 66.5 in | Wt 211.1 lb

## 2014-01-06 DIAGNOSIS — L304 Erythema intertrigo: Secondary | ICD-10-CM

## 2014-01-06 DIAGNOSIS — L538 Other specified erythematous conditions: Secondary | ICD-10-CM

## 2014-01-06 DIAGNOSIS — I10 Essential (primary) hypertension: Secondary | ICD-10-CM

## 2014-01-06 MED ORDER — NYSTATIN 100000 UNIT/GM EX OINT: 1.0000 "application " | TOPICAL_OINTMENT | Freq: Two times a day (BID) | CUTANEOUS | Status: DC

## 2014-01-06 MED ORDER — METOPROLOL SUCCINATE ER 50 MG PO TB24: ORAL_TABLET | ORAL | Status: DC

## 2014-01-06 NOTE — Patient Instructions (Signed)
Increase toprol xl from 1/2 tablet to a full tablet once daily. Apply nystatin cream twice daily to affected area.   Follow up in 1 month.

## 2014-01-06 NOTE — Assessment & Plan Note (Addendum)
Uncontrolled but not at goal. Follow up BP 154/65. Increase toprol xl from 25mg  to 50mg .  Follow up in 6 months.

## 2014-01-06 NOTE — Assessment & Plan Note (Signed)
Pt advised to apply warm hair dryer to affected area after shower.  Apply nystatin ointment bid to affected area.

## 2014-01-06 NOTE — Progress Notes (Signed)
Subjective:    Patient ID: [name], female    DOB: 1941-08-12, 72 y.o.   MRN: [id number]  HPI  [name] is a 73 yr old female who presents today for follow up of hypertension. Current antihypertensive meds include metoprolol, lisinopril, furosemide. Last visit she was started on toprol xl 50mg  1/2 tab once daily.  Reports that she is tolerating without difficulty.  She denies CP or sob.  Reports some mild chronic LE edema which is at baseline.    BP Readings from Last 3 Encounters:  01/06/14 148/70  12/12/13 205/66  11/28/13 138/74   S Of note, she saw Dr. Humphrey Rolls on 1/26 and BP in her office that day was 205/66.   Review of Systems See HPI  Past Medical History  Diagnosis Date  . Hyperplastic colon polyp   . Diabetes mellitus type II   . Osteoarthritis   . GERD (gastroesophageal reflux disease)   . Osteopenia   . Lupus     of skin see dermatologist frequently  . OA (osteoarthritis of spine)     C-spine  . DVT (deep venous thrombosis)     right leg DVT 05-17-2008  . Leg swelling     left leg 2-10, u/s showed a old clot, has a hypercoag panel,  . History of cardiovascular stress test     10/09 had CP, stress test neg, likely GI (GERD)-09/2006-had CP: stress test (-)  . 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 DVT (deep vein thrombosis) in pregnancy   . DVT of leg (deep venous thrombosis) 07/28/2011  . 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 Allergy   . Clotting disorder     hx of PE and DVT  . C. difficile diarrhea     hx of,   . Skin cancer     skin CA  . Left leg pain 03/12/2013  . Otitis externa 03/19/2013    right  . PONV (postoperative nausea and vomiting)   . Hypertension     pcp  dr Conley Canal   h.p   lebaur  . 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 Marital Status: Widowed    Spouse Name: N/A    Number of  Children: 3  . Years of Education: N/A   Occupational History  . Retired    Social History Main Topics  . Smoking status: Never Smoker   . Smokeless tobacco: Never Used  . Alcohol Use: No  . Drug Use: No  . Sexual Activity: Not Currently   Other Topics Concern  . Not on file   Social History Narrative   Single-widow    3 children , 1 in [place] (son, Kelsey - [place])    [place] to [place] from [place], [place]   Alcohol Use - no      tobacco-- never     Illicit Drug Use - no    Pt is Jehovah's Witness-No blood products   [name]           Past Surgical History  Procedure Laterality Date  . Cholecystectomy    . Breast biopsy      left breast and right breast  . Breast lumpectomy      left breast-ductal carcinoma in situ  . Appendectomy    .  Skin cancer excision      from back   . Breast lumpectomy with needle localization and axillary sentinel lymph node bx Right 06/17/2013    Procedure: BREAST LUMPECTOMY WITH NEEDLE LOCALIZATION AND AXILLARY SENTINEL LYMPH NODE BX;  Surgeon: Merrie Roof, MD;  Location: [facility];  Service: General;  Laterality: Right;    Family History  Problem Relation Age of Onset  . Diabetes Mother   . Diabetes    . Heart attack Neg Hx   . Colon cancer Neg Hx   . Breast cancer Neg Hx   . Esophageal cancer Neg Hx   . Rectal cancer Neg Hx   . Stomach cancer Neg Hx   . Cancer Father     melanoma  . Diabetes Brother   . Other Brother     amputation    Allergies  Allergen Reactions  . Fluconazole Other (See Comments)    REACTION: severe fatigue and muscle weakness  . Metformin Diarrhea  . Pioglitazone Other (See Comments)    REACTION: wt gain  . Vancomycin     Erythematous rash, hands and toes became cyanotic.      Current Outpatient Prescriptions on File Prior to Visit  Medication Sig Dispense Refill  . anastrozole (ARIMIDEX) 1 MG tablet Take 1 tablet (1 mg total) by mouth daily.  90 tablet  12  . B Complex-C  (SUPER B COMPLEX PO) Take 1 tablet by mouth daily.      . Blood Glucose Monitoring Suppl (FREESTYLE LITE) DEVI       . Calcium Carbonate-Vitamin D (CALCIUM-VITAMIN D) 600-200 MG-UNIT CAPS Take 1 tablet by mouth daily.       Marland Kitchen FREESTYLE LITE test strip Use to check blood sugar 4 times a day.      . furosemide (LASIX) 20 MG tablet Take 20 mg by mouth daily as needed for fluid.      Marland Kitchen glucosamine-chondroitin 500-400 MG tablet Take 1 tablet by mouth daily.       . insulin glargine (LANTUS) 100 UNIT/ML injection Inject 0.4 mLs (40 Units total) into the skin at bedtime.  3 mL  0  . insulin regular (NOVOLIN R,HUMULIN R) 100 units/mL injection Inject into the skin 3 (three) times daily before meals. Sliding scale 20-32 units      . lisinopril (PRINIVIL,ZESTRIL) 40 MG tablet Take 40 mg by mouth daily.      . metoprolol succinate (TOPROL-XL) 50 MG 24 hr tablet Take 1/2 tablet daily with or immediately following a meal.  15 tablet  2  . niacin 500 MG tablet Take 500 mg by mouth daily.       Marland Kitchen nystatin (MYCOSTATIN/NYSTOP) 100000 UNIT/GM POWD Apply powder to rash in groin twice daily as needed.  30 g  2  . silver sulfADIAZINE (SILVADENE) 1 % cream Apply 1 application topically daily.      . traMADol (ULTRAM) 50 MG tablet Take 1 tablet (50 mg total) by mouth every 8 (eight) hours as needed.  30 tablet  0  . warfarin (COUMADIN) 5 MG tablet Take 5-7.5 mg by mouth daily. Take 7.5mg  on Monday thru Friday, and 5mg t on Saturday and Sunday.      . Wound Cleansers (RADIAPLEX EX) Apply topically.       Current Facility-Administered Medications on File Prior to Visit  Medication Dose Route Frequency Provider Last Rate Last Dose  . silver sulfADIAZINE (SILVADENE) 1 % cream   Topical Daily Lora Paula, MD  BP 148/70  Pulse 67  Temp(Src) 98.2 F (36.8 C) (Oral)  Resp 18  Ht 5' 6.5" (1.689 m)  Wt 211 lb 1.9 oz (95.763 kg)  BMI 33.57 kg/m2  SpO2 99%       Objective:   Physical Exam    Constitutional: She is oriented to person, place, and time. She appears well-developed and well-nourished. No distress.  Neurological: She is alert and oriented to person, place, and time.  Skin: Skin is warm and dry.  Erythematous intertriginous rash beneath left skin fold.   Psychiatric: She has a normal mood and affect. Her behavior is normal. Judgment and thought content normal.          Assessment & Plan:

## 2014-01-06 NOTE — Progress Notes (Signed)
Pre visit review using our clinic review tool, if applicable. No additional management support is needed unless otherwise documented below in the visit note. 

## 2014-01-09 ENCOUNTER — Telehealth: Payer: Self-pay | Admitting: Family

## 2014-01-09 MED ORDER — METOPROLOL SUCCINATE ER 50 MG PO TB24: ORAL_TABLET | ORAL | Status: DC

## 2014-01-09 NOTE — Telephone Encounter (Signed)
Relevant patient education mailed to patient.  

## 2014-01-09 NOTE — Telephone Encounter (Signed)
Patient states that the pharmacy told her that they never received the metoprolol refill. Please resend.

## 2014-01-09 NOTE — Telephone Encounter (Signed)
Refill re-sent.  Left detailed message on home # and to call if any questions.

## 2014-01-12 ENCOUNTER — Other Ambulatory Visit: Payer: Self-pay | Admitting: Family Medicine

## 2014-01-17 ENCOUNTER — Telehealth: Payer: Self-pay | Admitting: Family

## 2014-01-17 DIAGNOSIS — C50919 Malignant neoplasm of unspecified site of unspecified female breast: Secondary | ICD-10-CM

## 2014-01-17 NOTE — Telephone Encounter (Signed)
Pt has appt with Dr Marlou Starks on 4.6.15, requesting referral Fax [phone]

## 2014-01-24 ENCOUNTER — Telehealth: Payer: Self-pay | Admitting: Family

## 2014-01-24 MED ORDER — AMLODIPINE BESYLATE 5 MG PO TABS: 5.0000 mg | ORAL_TABLET | Freq: Every day | ORAL | Status: DC

## 2014-01-24 NOTE — Telephone Encounter (Signed)
Cut toprol xl 50mg  tab in half and take 1/2 tab by mouth daily.  Add amlodipine 5 mg once daily, follow up on 3/23 as scheduled.  Call if stomach cramping worsens or if it does not improve.

## 2014-01-24 NOTE — Telephone Encounter (Signed)
toprol is giving her muscle weakness fatigue dry mouth stomach pain diarrhea.  What should she do

## 2014-01-24 NOTE — Telephone Encounter (Signed)
Pt reports increased muscle weakness in lower extremities since February. Has also noted stomach cramping on a daily basis and feels very fatigued since increasing metoprolol to 1 tablet daily at last visit on 01/09/14. Pt has follow up on 02/06/14.  Please advise.

## 2014-01-24 NOTE — Telephone Encounter (Signed)
Notified pt and she voices understanding. 

## 2014-01-27 ENCOUNTER — Emergency Department (HOSPITAL_BASED_OUTPATIENT_CLINIC_OR_DEPARTMENT_OTHER)
Admission: EM | Admit: 2014-01-27 | Discharge: 2014-01-27 | Disposition: A | Discharge status: Home / Self Care | Payer: Medicare Other | Attending: Emergency Medicine | Admitting: Emergency Medicine

## 2014-01-27 ENCOUNTER — Telehealth: Payer: Self-pay | Admitting: Family

## 2014-01-27 ENCOUNTER — Encounter (HOSPITAL_BASED_OUTPATIENT_CLINIC_OR_DEPARTMENT_OTHER): Payer: Self-pay | Admitting: Emergency Medicine

## 2014-01-27 ENCOUNTER — Telehealth (INDEPENDENT_AMBULATORY_CARE_PROVIDER_SITE_OTHER): Payer: Self-pay

## 2014-01-27 DIAGNOSIS — Z860100 Personal history of colon polyps, unspecified: Secondary | ICD-10-CM | POA: Insufficient documentation

## 2014-01-27 DIAGNOSIS — Z79899 Other long term (current) drug therapy: Secondary | ICD-10-CM | POA: Insufficient documentation

## 2014-01-27 DIAGNOSIS — M47812 Spondylosis without myelopathy or radiculopathy, cervical region: Secondary | ICD-10-CM | POA: Insufficient documentation

## 2014-01-27 DIAGNOSIS — Z794 Long term (current) use of insulin: Secondary | ICD-10-CM | POA: Insufficient documentation

## 2014-01-27 DIAGNOSIS — Z85828 Personal history of other malignant neoplasm of skin: Secondary | ICD-10-CM | POA: Insufficient documentation

## 2014-01-27 DIAGNOSIS — Z85820 Personal history of malignant melanoma of skin: Secondary | ICD-10-CM | POA: Insufficient documentation

## 2014-01-27 DIAGNOSIS — Z8619 Personal history of other infectious and parasitic diseases: Secondary | ICD-10-CM | POA: Insufficient documentation

## 2014-01-27 DIAGNOSIS — I10 Essential (primary) hypertension: Secondary | ICD-10-CM | POA: Insufficient documentation

## 2014-01-27 DIAGNOSIS — N61 Mastitis without abscess: Secondary | ICD-10-CM | POA: Insufficient documentation

## 2014-01-27 DIAGNOSIS — Z853 Personal history of malignant neoplasm of breast: Secondary | ICD-10-CM | POA: Insufficient documentation

## 2014-01-27 DIAGNOSIS — K219 Gastro-esophageal reflux disease without esophagitis: Secondary | ICD-10-CM | POA: Insufficient documentation

## 2014-01-27 DIAGNOSIS — Z8669 Personal history of other diseases of the nervous system and sense organs: Secondary | ICD-10-CM | POA: Insufficient documentation

## 2014-01-27 DIAGNOSIS — E119 Type 2 diabetes mellitus without complications: Secondary | ICD-10-CM | POA: Insufficient documentation

## 2014-01-27 DIAGNOSIS — Y849 Medical procedure, unspecified as the cause of abnormal reaction of the patient, or of later complication, without mention of misadventure at the time of the procedure: Secondary | ICD-10-CM | POA: Insufficient documentation

## 2014-01-27 DIAGNOSIS — Z9089 Acquired absence of other organs: Secondary | ICD-10-CM | POA: Insufficient documentation

## 2014-01-27 DIAGNOSIS — L0390 Cellulitis, unspecified: Secondary | ICD-10-CM

## 2014-01-27 DIAGNOSIS — Z86718 Personal history of other venous thrombosis and embolism: Secondary | ICD-10-CM | POA: Insufficient documentation

## 2014-01-27 DIAGNOSIS — IMO0002 Reserved for concepts with insufficient information to code with codable children: Secondary | ICD-10-CM

## 2014-01-27 DIAGNOSIS — Z862 Personal history of diseases of the blood and blood-forming organs and certain disorders involving the immune mechanism: Secondary | ICD-10-CM | POA: Insufficient documentation

## 2014-01-27 DIAGNOSIS — Z7901 Long term (current) use of anticoagulants: Secondary | ICD-10-CM | POA: Insufficient documentation

## 2014-01-27 DIAGNOSIS — Z86711 Personal history of pulmonary embolism: Secondary | ICD-10-CM | POA: Insufficient documentation

## 2014-01-27 MED ORDER — DOXYCYCLINE HYCLATE 100 MG PO CAPS: 100.0000 mg | ORAL_CAPSULE | Freq: Two times a day (BID) | ORAL | Status: DC

## 2014-01-27 MED ORDER — HYDROCODONE-ACETAMINOPHEN 5-325 MG PO TABS: 1.0000 | ORAL_TABLET | Freq: Four times a day (QID) | ORAL | Status: DC | PRN

## 2014-01-27 MED ORDER — TRAMADOL HCL 50 MG PO TABS: 50.0000 mg | ORAL_TABLET | Freq: Four times a day (QID) | ORAL | Status: DC | PRN

## 2014-01-27 MED ORDER — LIDOCAINE-EPINEPHRINE 2 %-1:100000 IJ SOLN
INTRAMUSCULAR | Status: AC
  Administered 2014-01-27: 1 mL
  Filled 2014-01-27: qty 1

## 2014-01-27 NOTE — ED Provider Notes (Signed)
CSN: [id number]     Arrival date & time 01/27/14  1649 History   First MD Initiated Contact with Patient 01/27/14 1812  This chart was scribed for Victorious Cosio B. Karle Starch, MD by Anastasia Pall, ED Scribe. This patient was seen in room MH11/MH11 and the patient's care was started at 6:58 PM.   Chief Complaint  Patient presents with  . Skin Ulcer   (Consider location/radiation/quality/duration/timing/severity/associated sxs/prior Treatment) The history is provided by the patient and a relative. No language interpreter was used.   HPI Comments: [name] is a 73 y.o. female with h/o right breast cancer and surgery 7 months ago, who presents to the Emergency Department complaining of gradually worsening, constant, right breast pain, onset 1 week ago, with associated hardness and redness. Relative reports pt has also had associated fever. She denies any other associated symptoms. Pt reports h/o DM, states her levels have been okay. She reports level of 143 earlier this morning. She denies having checked this afternoon.   PCP - Nance Pear., NP  Past Medical History  Diagnosis Date  . Hyperplastic colon polyp   . Diabetes mellitus type II   . Osteoarthritis   . GERD (gastroesophageal reflux disease)   . Osteopenia   . Lupus     of skin see dermatologist frequently  . OA (osteoarthritis of spine)     C-spine  . DVT (deep venous thrombosis)     right leg DVT 05-17-2008  . Leg swelling     left leg 2-10, u/s showed a old clot, has a hypercoag panel,  . History of cardiovascular stress test     10/09 had CP, stress test neg, likely GI (GERD)-09/2006-had CP: stress test (-)  . 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 DVT (deep vein thrombosis) in pregnancy   . DVT of leg (deep venous thrombosis) 07/28/2011  . 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 Allergy   . Clotting disorder     hx of PE and DVT  . C. difficile diarrhea     hx of,   . Skin cancer     skin CA  . Left leg  pain 03/12/2013  . Otitis externa 03/19/2013    right  . PONV (postoperative nausea and vomiting)   . Hypertension     pcp  dr Conley Canal   h.p   lebaur  . Breast cancer     left ductal breast ca dx 2003 approx, s/p XRt x 6 weeks, released from oncology (per  patient); dx right breast ca 2014   Past Surgical History  Procedure Laterality Date  . Cholecystectomy    . Breast biopsy      left breast and right breast  . Breast lumpectomy      left breast-ductal carcinoma in situ  . Appendectomy    . Skin cancer excision      from back   . Breast lumpectomy with needle localization and axillary sentinel lymph node bx Right 06/17/2013    Procedure: BREAST LUMPECTOMY WITH NEEDLE LOCALIZATION AND AXILLARY SENTINEL LYMPH NODE BX;  Surgeon: Merrie Roof, MD;  Location: [facility];  Service: General;  Laterality: Right;   Family History  Problem Relation Age of Onset  . Diabetes Mother   . Diabetes    . Heart attack Neg Hx   . Colon cancer Neg Hx   . Breast cancer Neg Hx   . Esophageal cancer Neg Hx   . Rectal cancer Neg Hx   .  Stomach cancer Neg Hx   . Cancer Father     melanoma  . Diabetes Brother   . Other Brother     amputation   History  Substance Use Topics  . Smoking status: Never Smoker   . Smokeless tobacco: Never Used  . Alcohol Use: No   OB History   Grav Para Term Preterm Abortions TAB SAB Ect Mult Living                 Review of Systems A complete 10 system review of systems was obtained and all systems are negative except as noted in the HPI and PMH.   Allergies  Fluconazole; Metformin; Pioglitazone; and Vancomycin  Home Medications   Current Outpatient Rx  Name  Route  Sig  Dispense  Refill  . amLODipine (NORVASC) 5 MG tablet   Oral   Take 1 tablet (5 mg total) by mouth daily.   30 tablet   1   . B Complex-C (SUPER B COMPLEX PO)   Oral   Take 1 tablet by mouth daily.         . Blood Glucose Monitoring Suppl (FREESTYLE LITE) DEVI               .  Calcium Carbonate-Vitamin D (CALCIUM-VITAMIN D) 600-200 MG-UNIT CAPS   Oral   Take 1 tablet by mouth daily.          Marland Kitchen FREESTYLE LITE test strip      Use to check blood sugar 4 times a day.         . furosemide (LASIX) 20 MG tablet   Oral   Take 20 mg by mouth daily as needed for fluid.         Marland Kitchen glucosamine-chondroitin 500-400 MG tablet   Oral   Take 1 tablet by mouth daily.          . insulin glargine (LANTUS) 100 UNIT/ML injection   Subcutaneous   Inject 0.4 mLs (40 Units total) into the skin at bedtime.   3 mL   0     LOT [id number]  EXP 02/2016   . insulin regular (NOVOLIN R,HUMULIN R) 100 units/mL injection   Subcutaneous   Inject into the skin 3 (three) times daily before meals. Sliding scale 20-32 units         . lisinopril (PRINIVIL,ZESTRIL) 40 MG tablet      TAKE 1 TABLET (40 MG TOTAL) BY MOUTH DAILY.   90 tablet   1     CYCLE FILL MEDICATION. Authorization is required f ...   . metoprolol succinate (TOPROL-XL) 50 MG 24 hr tablet      25 mg. Take 1 tablet by mouth daily         . niacin 500 MG tablet   Oral   Take 500 mg by mouth daily.          Marland Kitchen nystatin (MYCOSTATIN/NYSTOP) 100000 UNIT/GM POWD      Apply powder to rash in groin twice daily as needed.   30 g   2   . nystatin ointment (MYCOSTATIN)   Topical   Apply 1 application topically 2 (two) times daily.   30 g   2   . ONETOUCH DELICA LANCETS FINE MISC      Use to check blood sugar 4 times daily.         . traMADol (ULTRAM) 50 MG tablet   Oral   Take 1 tablet (50 mg total) by  mouth every 8 (eight) hours as needed.   30 tablet   0   . warfarin (COUMADIN) 5 MG tablet   Oral   Take 5-7.5 mg by mouth daily. Take 7.5mg  on Monday thru Friday, and 5mg t on Saturday and Sunday.         . Wound Cleansers (RADIAPLEX EX)   Apply externally   Apply topically.          BP 158/56  Pulse 78  Temp(Src) 99.2 F (37.3 C) (Oral)  Resp 20  Ht 5' 6.5" (1.689 m)  Wt 211 lb  (95.709 kg)  BMI 33.55 kg/m2  SpO2 100%  Physical Exam  Nursing note and vitals reviewed. Constitutional: She is oriented to person, place, and time. She appears well-developed and well-nourished.  HENT:  Head: Normocephalic and atraumatic.  Eyes: EOM are normal. Pupils are equal, round, and reactive to light.  Neck: Normal range of motion. Neck supple.  Cardiovascular: Normal rate, normal heart sounds and intact distal pulses.   Pulmonary/Chest: Effort normal and breath sounds normal. She has no wheezes. She has no rales.  Erythema, induration and warmth to R lateral breast and extending into axilla, tender to palpation with a subcutaneous mass approx 3cm x 2cm deep to axillary surgical scar  Abdominal: Bowel sounds are normal. She exhibits no distension. There is no tenderness.  Musculoskeletal: Normal range of motion. She exhibits no edema and no tenderness.  Neurological: She is alert and oriented to person, place, and time. She has normal strength. No cranial nerve deficit or sensory deficit.  Skin: Skin is warm and dry. No rash noted.  Psychiatric: She has a normal mood and affect.    ED Course  Procedures (including critical care time)  DIAGNOSTIC STUDIES: Oxygen Saturation is 100% on room air, normal by my interpretation.    COORDINATION OF CARE: 7:02 PM-Discussed treatment plan which includes [place], antibiotic, and aspiration with pt at bedside and pt agreed to plan.   7:06 PM - Limited bedside US performed. Shows a small fluid collection just deep to the axillary surgical scar, no visualized debris  Aspiration Performed by: Truddie Hidden. Consent: Verbal consent obtained. Risks and benefits: risks, benefits and alternatives were discussed Time out performed prior to procedure Type: undefined fluid collection Body area: R axilla Anesthesia: local infiltration Aspiration with 18ga needle Local anesthetic: lidocaine 2% with epinephrine Anesthetic total: 2  ml Aspiration of 10cc serous fluid, no signs of infection Patient tolerance: Patient tolerated the procedure well with no immediate complications.    Labs Review Labs Reviewed - No data to display Imaging Review No results found.   EKG Interpretation None     Medications - No data to display  MDM   Final diagnoses:  Cellulitis  Seroma    Pt with superficial cellulitis overlying what is likely a post-operative seroma. Doubt this is abscess given serous fluid on aspiration. Will start Doxycycline, advised PCP followup for recheck and return to the ED in the meantime if not improving or if she develops fever, worsening pain or erythema beyond currently marked margins.     I personally performed the services described in this documentation, which was scribed in my presence. The recorded information has been reviewed and is accurate.      Yvonnia Tango B. Karle Starch, MD 01/27/14 1950

## 2014-01-27 NOTE — Telephone Encounter (Signed)
Pt called back stating the surgeon advised her to proceed to the ER and she will follow their recommendation.

## 2014-01-27 NOTE — Telephone Encounter (Signed)
Patient wants to know if she can take ultram with all the other medications that she is taking?

## 2014-01-27 NOTE — ED Notes (Signed)
States she has surgery for breast cancerin her right breast 7 months ago. Has pain by her right breast. She was told to come here.

## 2014-01-27 NOTE — Discharge Instructions (Signed)
Please take medications as prescribed. Be aware that antibiotics may cause your blood to become more thin than desired. Please follow up with your PCP for recheck and to ensure your INR does not get too high.  Cellulitis Cellulitis is an infection of the skin and the tissue beneath it. The infected area is usually red and tender. Cellulitis occurs most often in the arms and lower legs.  CAUSES  Cellulitis is caused by bacteria that enter the skin through cracks or cuts in the skin. The most common types of bacteria that cause cellulitis are Staphylococcus and Streptococcus. SYMPTOMS   Redness and warmth.  Swelling.  Tenderness or pain.  Fever. DIAGNOSIS  Your caregiver can usually determine what is wrong based on a physical exam. Blood tests may also be done. TREATMENT  Treatment usually involves taking an antibiotic medicine. HOME CARE INSTRUCTIONS   Take your antibiotics as directed. Finish them even if you start to feel better.  Keep the infected arm or leg elevated to reduce swelling.  Apply a warm cloth to the affected area up to 4 times per day to relieve pain.  Only take over-the-counter or prescription medicines for pain, discomfort, or fever as directed by your caregiver.  Keep all follow-up appointments as directed by your caregiver. SEEK MEDICAL CARE IF:   You notice red streaks coming from the infected area.  Your red area gets larger or turns dark in color.  Your bone or joint underneath the infected area becomes painful after the skin has healed.  Your infection returns in the same area or another area.  You notice a swollen bump in the infected area.  You develop new symptoms. SEEK IMMEDIATE MEDICAL CARE IF:   You have a fever.  You feel very sleepy.  You develop vomiting or diarrhea.  You have a general ill feeling (malaise) with muscle aches and pains. MAKE SURE YOU:   Understand these instructions.  Will watch your condition.  Will get help  right away if you are not doing well or get worse. Document Released: 08/13/2005 Document Revised: 05/04/2012 Document Reviewed: 01/19/2012 [facility] Patient Information 2014 [place].  Seroma A seroma is a collection of fluid that looks like swelling or a mass on the body. Seromas form on the body where tissue has been injured or cut. They are most common after surgeries. Seromas vary in size. Some are small and painless. Others may become large and cause pain or discomfort. Many seromas go away on their own; the fluid is naturally absorbed by the body. Some may require the fluid to be drained through medical procedures.  CAUSES  Seromas form as the result of damage to tissue or the removal of tissue. This tissue damage may occur during surgery or because of an injury or trauma. When tissue is disrupted or removed, empty space is created. The body's natural defense system causes fluid to enter the empty space and form a seroma. SYMPTOMS   Swelling at the site of a surgical cut (incision) or an injury.  Drainage of clear fluid at the surgery or injury site.  Possible discomfort or pain. DIAGNOSIS  Your caregiver will perform a physical exam. During the exam, the caregiver will press on the seroma using a hand or fingers (palpation). Various tests may be ordered to help confirm the diagnosis. These tests may include:  Blood tests.  Imaging tests such as ultrasonography or computed tomography (CT). TREATMENT  Sometimes seromas resolve on their own and drain naturally in  the body. Your caregiver may monitor you to make sure the seroma does not cause any complications. If your seroma does not resolve on its own, treatment may include:  Using a needle to drain the fluid from the seroma (needle aspiration).  Inserting a flexible tube (catheter) to drain the fluid.  Applying a dressing, such as an elastic bandage or binder.  Use of antibiotic medicines if the seroma becomes  infected.  In rare cases, surgery may be done to remove the seroma and repair the area. HOME CARE INSTRUCTIONS  Follow your caregiver's instructions regarding activity levels and any limitations on movements.  Only take over-the-counter or prescription medicines as directed by your caregiver.  If your caregiver prescribes antibiotics, take them as directed. Finish them even if you start to feel better.  Check your seroma every day for redness, warmth, or yellow drainage.  Follow up with your caregiver as directed. SEEK MEDICAL CARE IF:  You develop a fever.  You have pain, tenderness, redness, or warmth at the site of the seroma.  You notice yellow drainage coming from the site of the seroma.  Your seroma is getting bigger. Document Released: 02/28/2013 Document Reviewed: 02/28/2013 [facility] Patient Information 2014 [place], [place].

## 2014-01-27 NOTE — Telephone Encounter (Signed)
Patient states right axillary fluid pocket  has gotten larger and painful Advised to go to the Ed. She ask if she could go to the Ed [place] I replied yes and I would inform Dr. Marlou Starks .

## 2014-01-27 NOTE — Telephone Encounter (Signed)
Spoke with pt. She states we prescribed this for her last year and she wanted to make sure it would not react with anything on her current med list.  Pt states she is needing it for swollen, painful lump under her arm from breast surgery. States her surgeon thinks it is a fluid pocket but she thinks it is enlarging and is very painful. She denies redness or warmth of area. Advised pt she should contact her surgeon for further recommendations but I checked with the pharmacist and he did not see any potential reactions between ultram and current medications. Pt voices understanding.

## 2014-01-30 ENCOUNTER — Ambulatory Visit (INDEPENDENT_AMBULATORY_CARE_PROVIDER_SITE_OTHER): Payer: Medicare Other | Admitting: Family

## 2014-01-30 ENCOUNTER — Encounter: Payer: Self-pay | Admitting: Family

## 2014-01-30 VITALS — BP 130/50 | HR 80 | Temp 98.5°F | Resp 16 | Ht 66.5 in | Wt 208.0 lb

## 2014-01-30 DIAGNOSIS — N61 Mastitis without abscess: Secondary | ICD-10-CM

## 2014-01-30 DIAGNOSIS — R32 Unspecified urinary incontinence: Secondary | ICD-10-CM

## 2014-01-30 NOTE — Progress Notes (Signed)
Subjective:    Patient ID: [name], female    DOB: 1941-05-20, 73 y.o.   MRN: [id number]  HPI  [name] is a 73 yr old female who presents today for ED follow up.  She is 7 months out from her breast cancer lumpectomy.  Was seen in the ED on 3/13 due to breast pain.  Reports pain started last week and then  worsened. Describes an associated burning sensation.  Seroma was drained in the ED and she was placed on doxycyline. Per patient pain and redness is "about the same" today as it was on 3/13.   She has apt with coumadin clinic on Thursday.   She complains about chronic urinary incontinence and wonders what she can do.   Review of Systems See HPI  Past Medical History  Diagnosis Date  . Hyperplastic colon polyp   . Diabetes mellitus type II   . Osteoarthritis   . GERD (gastroesophageal reflux disease)   . Osteopenia   . Lupus     of skin see dermatologist frequently  . OA (osteoarthritis of spine)     C-spine  . DVT (deep venous thrombosis)     right leg DVT 05-17-2008  . Leg swelling     left leg 2-10, u/s showed a old clot, has a hypercoag panel,  . History of cardiovascular stress test     10/09 had CP, stress test neg, likely GI (GERD)-09/2006-had CP: stress test (-)  . 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 DVT (deep vein thrombosis) in pregnancy   . DVT of leg (deep venous thrombosis) 07/28/2011  . 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 Allergy   . Clotting disorder     hx of PE and DVT  . C. difficile diarrhea     hx of,   . Skin cancer     skin CA  . Left leg pain 03/12/2013  . Otitis externa 03/19/2013    right  . PONV (postoperative nausea and vomiting)   . Hypertension     pcp  dr Conley Canal   h.p   lebaur  . 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 Marital Status: Widowed    Spouse Name: N/A    Number of  Children: 3  . Years of Education: N/A   Occupational History  . Retired    Social History Main Topics  . Smoking status: Never Smoker   . Smokeless tobacco: Never Used  . Alcohol Use: No  . Drug Use: No  . Sexual Activity: Not Currently   Other Topics Concern  . Not on file   Social History Narrative   Single-widow    3 children , 1 in [place] (son, daughter - [place])    [place] to [place] 2002 from [place], [place]   Alcohol Use - no      tobacco-- never     Illicit Drug Use - no    Pt is Jehovah's Witness-No blood products   Daughter - Deandria Klute           Past Surgical History  Procedure Laterality Date  . Cholecystectomy    . Breast biopsy      left breast and right breast  . Breast lumpectomy      left breast-ductal carcinoma in  situ  . Appendectomy    . Skin cancer excision      from back   . Breast lumpectomy with needle localization and axillary sentinel lymph node bx Right 06/17/2013    Procedure: BREAST LUMPECTOMY WITH NEEDLE LOCALIZATION AND AXILLARY SENTINEL LYMPH NODE BX;  Surgeon: Merrie Roof, MD;  Location: [facility];  Service: General;  Laterality: Right;    Family History  Problem Relation Age of Onset  . Diabetes Mother   . Diabetes    . Heart attack Neg Hx   . Colon cancer Neg Hx   . Breast cancer Neg Hx   . Esophageal cancer Neg Hx   . Rectal cancer Neg Hx   . Stomach cancer Neg Hx   . Cancer Father     melanoma  . Diabetes Brother   . Other Brother     amputation    Allergies  Allergen Reactions  . Fluconazole Other (See Comments)    REACTION: severe fatigue and muscle weakness  . Metformin Diarrhea  . Pioglitazone Other (See Comments)    REACTION: wt gain  . Vancomycin     Erythematous rash, hands and toes became cyanotic.      Current Outpatient Prescriptions on File Prior to Visit  Medication Sig Dispense Refill  . amLODipine (NORVASC) 5 MG tablet Take 1 tablet (5 mg total) by mouth daily.  30 tablet  1  . B Complex-C (SUPER  B COMPLEX PO) Take 1 tablet by mouth daily.      . Blood Glucose Monitoring Suppl (FREESTYLE LITE) DEVI       . Calcium Carbonate-Vitamin D (CALCIUM-VITAMIN D) 600-200 MG-UNIT CAPS Take 1 tablet by mouth daily.       Marland Kitchen doxycycline (VIBRAMYCIN) 100 MG capsule Take 1 capsule (100 mg total) by mouth 2 (two) times daily.  20 capsule  0  . FREESTYLE LITE test strip Use to check blood sugar 4 times a day.      . furosemide (LASIX) 20 MG tablet Take 20 mg by mouth daily as needed for fluid.      Marland Kitchen glucosamine-chondroitin 500-400 MG tablet Take 1 tablet by mouth daily.       . insulin glargine (LANTUS) 100 UNIT/ML injection Inject 0.4 mLs (40 Units total) into the skin at bedtime.  3 mL  0  . insulin regular (NOVOLIN R,HUMULIN R) 100 units/mL injection Inject into the skin 3 (three) times daily before meals. Sliding scale 20-32 units      . lisinopril (PRINIVIL,ZESTRIL) 40 MG tablet TAKE 1 TABLET (40 MG TOTAL) BY MOUTH DAILY.  90 tablet  1  . metoprolol succinate (TOPROL-XL) 50 MG 24 hr tablet 25 mg. Take 1 tablet by mouth daily      . niacin 500 MG tablet Take 500 mg by mouth daily.       Marland Kitchen nystatin (MYCOSTATIN/NYSTOP) 100000 UNIT/GM POWD Apply powder to rash in groin twice daily as needed.  30 g  2  . nystatin ointment (MYCOSTATIN) Apply 1 application topically 2 (two) times daily.  30 g  2  . ONETOUCH DELICA LANCETS FINE MISC Use to check blood sugar 4 times daily.      . traMADol (ULTRAM) 50 MG tablet Take 1 tablet (50 mg total) by mouth every 6 (six) hours as needed.  15 tablet  0  . warfarin (COUMADIN) 5 MG tablet Take 5-7.5 mg by mouth daily. Take 7.5mg  on Monday thru Friday, and 5mg t on Saturday and Sunday.      Marland Kitchen  Wound Cleansers (RADIAPLEX EX) Apply topically.       Current Facility-Administered Medications on File Prior to Visit  Medication Dose Route Frequency Provider Last Rate Last Dose  . silver sulfADIAZINE (SILVADENE) 1 % cream   Topical Daily Lora Paula, MD        BP 130/50   Pulse 80  Temp(Src) 98.5 F (36.9 C) (Oral)  Resp 16  Ht 5' 6.5" (1.689 m)  Wt 208 lb (94.348 kg)  BMI 33.07 kg/m2  SpO2 99%       Objective:   Physical Exam  Constitutional: She appears well-developed and well-nourished. No distress.  Cardiovascular: Normal rate and regular rhythm.   No murmur heard. Pulmonary/Chest: Effort normal and breath sounds normal. No respiratory distress. She has no wheezes. She has no rales. She exhibits no tenderness.    R breast is swollen and erythematous. No discrete fluid collection/abscess is palpated at site of drainage in ED  Abdominal: Soft. Bowel sounds are normal.          Assessment & Plan:

## 2014-01-30 NOTE — Patient Instructions (Signed)
Please continue doxycycline. Contact coumadin clinic to notify them that you are on antibiotics. Follow up Wednesday. Call us sooner if you develop increased pain, swelling, redness or drainage from the right breast.

## 2014-01-30 NOTE — Progress Notes (Signed)
Pre visit review using our clinic review tool, if applicable. No additional management support is needed unless otherwise documented below in the visit note. 

## 2014-01-31 DIAGNOSIS — N61 Mastitis without abscess: Secondary | ICD-10-CM | POA: Insufficient documentation

## 2014-01-31 DIAGNOSIS — R32 Unspecified urinary incontinence: Secondary | ICD-10-CM | POA: Insufficient documentation

## 2014-01-31 NOTE — Assessment & Plan Note (Signed)
Refer to urology for further evaluation. 

## 2014-01-31 NOTE — Assessment & Plan Note (Signed)
Pt is afebrile. Has been on doxy 48 hrs.  Erythema seems slightly regressed compared to the demarcation line which was drawn in the ED.  I recommended to the pt that she continue doxycycline with close monitoring and contact if redness/pain worsens, or if she develops fever. Follow up in our office in 48 hours.

## 2014-02-01 ENCOUNTER — Ambulatory Visit (INDEPENDENT_AMBULATORY_CARE_PROVIDER_SITE_OTHER): Payer: Medicare Other | Admitting: Family

## 2014-02-01 ENCOUNTER — Telehealth: Payer: Self-pay | Admitting: Family

## 2014-02-01 ENCOUNTER — Encounter: Payer: Self-pay | Admitting: Family

## 2014-02-01 VITALS — BP 128/60 | HR 70 | Temp 98.0°F | Resp 18 | Ht 66.5 in | Wt 210.0 lb

## 2014-02-01 DIAGNOSIS — L304 Erythema intertrigo: Secondary | ICD-10-CM

## 2014-02-01 DIAGNOSIS — L0291 Cutaneous abscess, unspecified: Secondary | ICD-10-CM

## 2014-02-01 DIAGNOSIS — L0390 Cellulitis, unspecified: Principal | ICD-10-CM

## 2014-02-01 DIAGNOSIS — N61 Mastitis without abscess: Secondary | ICD-10-CM

## 2014-02-01 DIAGNOSIS — L538 Other specified erythematous conditions: Secondary | ICD-10-CM

## 2014-02-01 MED ORDER — FLUCONAZOLE 150 MG PO TABS: ORAL_TABLET | ORAL | Status: DC

## 2014-02-01 MED ORDER — CLINDAMYCIN HCL 300 MG PO CAPS: 300.0000 mg | ORAL_CAPSULE | Freq: Four times a day (QID) | ORAL | Status: DC

## 2014-02-01 NOTE — Progress Notes (Signed)
Pre visit review using our clinic review tool, if applicable. No additional management support is needed unless otherwise documented below in the visit note. 

## 2014-02-01 NOTE — Telephone Encounter (Signed)
Unable to leave message at home #, left message on cell to return my call.

## 2014-02-01 NOTE — Assessment & Plan Note (Signed)
The pt is feeling slightly better and erythema is slightly improved. I am concerned that she has a deep abscess of the right breast which may require incision and drainage.  I recommended that she continue doxycycline. Will add clindamycin for additional coverage as well as a probiotic for colon health.  A referral has been made for her to see a surgeon tomorrow afternoon for further evaluation.

## 2014-02-01 NOTE — Telephone Encounter (Signed)
Spoke with pt and advised her that clindamycin rx is correct at four times a day and should be taken in addition to doxycycline. Pt states she did not get Florastor probiotic but picked up one that states colon health. Advised her that should be ok to take. Please advise if further recommendation.

## 2014-02-01 NOTE — Assessment & Plan Note (Signed)
Will rx with diflucan and continue nystatin powder. She has apt at coumadin clinic this Friday and I have advised her to keep this appointment and bring her current med list to show them as she is now on multiple antibiotics.

## 2014-02-01 NOTE — Telephone Encounter (Signed)
Patient has questions regarding the antibiotics. She wants to know if she should take both doxycicline(sp?) and clindamyacin(sp?) together. Also, she had additional questions regarding the probiotic that she is on.

## 2014-02-01 NOTE — Progress Notes (Signed)
Subjective:    Patient ID: [name], female    DOB: 11-Dec-1940, 73 y.o.   MRN: [id number]  HPI  [name] is a 73 yr old female who presents today for follow up of her right breast cellulitis.  She is on doxycycline and notes "slight" improvement in her right breast pain.     She reports no improvement in skin rash with nystatin cream or powder.  Review of Systems    see HPI  Past Medical History  Diagnosis Date  . Hyperplastic colon polyp   . Diabetes mellitus type II   . Osteoarthritis   . GERD (gastroesophageal reflux disease)   . Osteopenia   . Lupus     of skin see dermatologist frequently  . OA (osteoarthritis of spine)     C-spine  . DVT (deep venous thrombosis)     right leg DVT 05-17-2008  . Leg swelling     left leg 2-10, u/s showed a old clot, has a hypercoag panel,  . History of cardiovascular stress test     10/09 had CP, stress test neg, likely GI (GERD)-09/2006-had CP: stress test (-)  . 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 DVT (deep vein thrombosis) in pregnancy   . DVT of leg (deep venous thrombosis) 07/28/2011  . 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 Allergy   . Clotting disorder     hx of PE and DVT  . C. difficile diarrhea     hx of,   . Skin cancer     skin CA  . Left leg pain 03/12/2013  . Otitis externa 03/19/2013    right  . PONV (postoperative nausea and vomiting)   . Hypertension     pcp  dr Lendell Caprice   h.p   lebaur  . 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 Marital Status: Widowed    Spouse Name: N/A    Number of Children: 3  . Years of Education: N/A   Occupational History  . Retired    Social History Main Topics  . Smoking status: Never Smoker   . Smokeless tobacco: Never Used  . Alcohol Use: No  . Drug Use: No  . Sexual Activity: Not Currently   Other Topics Concern  . Not  on file   Social History Narrative   Single-widow    3 children , 1 in [place] (son, daughter - [place])    [place] to [place] 2002 from [place], [place]   Alcohol Use - no      tobacco-- never     Illicit Drug Use - no    Pt is Jehovah's Witness-No blood products   Daughter - [name]           Past Surgical History  Procedure Laterality Date  . Cholecystectomy    . Breast biopsy      left breast and right breast  . Breast lumpectomy      left breast-ductal carcinoma in situ  . Appendectomy    . Skin cancer excision      from back   . Breast lumpectomy with needle localization and axillary sentinel lymph node bx Right 06/17/2013    Procedure: BREAST LUMPECTOMY WITH NEEDLE LOCALIZATION AND AXILLARY SENTINEL LYMPH NODE BX;  Surgeon: Caleen Essex  III, MD;  Location: [facility];  Service: General;  Laterality: Right;    Family History  Problem Relation Age of Onset  . Diabetes Mother   . Diabetes    . Heart attack Neg Hx   . Colon cancer Neg Hx   . Breast cancer Neg Hx   . Esophageal cancer Neg Hx   . Rectal cancer Neg Hx   . Stomach cancer Neg Hx   . Cancer Father     melanoma  . Diabetes Brother   . Other Brother     amputation    Allergies  Allergen Reactions  . Metformin Diarrhea  . Pioglitazone Other (See Comments)    REACTION: wt gain  . Vancomycin     Erythematous rash, hands and toes became cyanotic.      Current Outpatient Prescriptions on File Prior to Visit  Medication Sig Dispense Refill  . amLODipine (NORVASC) 5 MG tablet Take 1 tablet (5 mg total) by mouth daily.  30 tablet  1  . B Complex-C (SUPER B COMPLEX PO) Take 1 tablet by mouth daily.      . Blood Glucose Monitoring Suppl (FREESTYLE LITE) DEVI       . Calcium Carbonate-Vitamin D (CALCIUM-VITAMIN D) 600-200 MG-UNIT CAPS Take 1 tablet by mouth daily.       Marland Kitchen doxycycline (VIBRAMYCIN) 100 MG capsule Take 1 capsule (100 mg total) by mouth 2 (two) times daily.  20 capsule  0  . FREESTYLE LITE test strip  Use to check blood sugar 4 times a day.      . furosemide (LASIX) 20 MG tablet Take 20 mg by mouth daily as needed for fluid.      Marland Kitchen glucosamine-chondroitin 500-400 MG tablet Take 1 tablet by mouth daily.       . insulin glargine (LANTUS) 100 UNIT/ML injection Inject 0.4 mLs (40 Units total) into the skin at bedtime.  3 mL  0  . insulin regular (NOVOLIN R,HUMULIN R) 100 units/mL injection Inject into the skin 3 (three) times daily before meals. Sliding scale 20-32 units      . lisinopril (PRINIVIL,ZESTRIL) 40 MG tablet TAKE 1 TABLET (40 MG TOTAL) BY MOUTH DAILY.  90 tablet  1  . metoprolol succinate (TOPROL-XL) 50 MG 24 hr tablet 25 mg. Take 1 tablet by mouth daily      . niacin 500 MG tablet Take 500 mg by mouth daily.       Marland Kitchen nystatin (MYCOSTATIN/NYSTOP) 100000 UNIT/GM POWD Apply powder to rash in groin twice daily as needed.  30 g  2  . nystatin ointment (MYCOSTATIN) Apply 1 application topically 2 (two) times daily.  30 g  2  . ONETOUCH DELICA LANCETS FINE MISC Use to check blood sugar 4 times daily.      . traMADol (ULTRAM) 50 MG tablet Take 1 tablet (50 mg total) by mouth every 6 (six) hours as needed.  15 tablet  0  . warfarin (COUMADIN) 5 MG tablet Take 5-7.5 mg by mouth daily. Take 7.5mg  on Monday thru Friday, and 5mg t on Saturday and Sunday.      . Wound Cleansers (RADIAPLEX EX) Apply topically.       Current Facility-Administered Medications on File Prior to Visit  Medication Dose Route Frequency Provider Last Rate Last Dose  . silver sulfADIAZINE (SILVADENE) 1 % cream   Topical Daily Lora Paula, MD        BP 128/60  Pulse 70  Temp(Src) 98 F (36.7  C) (Oral)  Resp 18  Ht 5' 6.5" (1.689 m)  Wt 210 lb (95.255 kg)  BMI 33.39 kg/m2  SpO2 97%    Objective:   Physical Exam  Constitutional: She is oriented to person, place, and time. She appears well-developed and well-nourished. No distress.  HENT:  Head: Normocephalic and atraumatic.  Pulmonary/Chest:  R breast  tissue is noted to be slightly softer in affected area.  Erythema is slightly improved.  Seems to have more induration (no palpable fluctuance) of right lateral breast.    Neurological: She is alert and oriented to person, place, and time.  Psychiatric: She has a normal mood and affect. Her behavior is normal. Judgment and thought content normal.  Skin:  + erythema noted beneath left abdominal skin fold       Assessment & Plan:

## 2014-02-01 NOTE — Patient Instructions (Signed)
Continue Doxycycline, add clindamycin. See the surgeon as scheduled tomorrow. Start diflucan today, repeat dose in 3 days- this is for you rash. Keep coumadin clinic appointment on Friday and bring your med list with you.  Make sure you are taking a probiotic- such as florastor one cap twice daily to protect your bowels. Call if you develop fever, swelling, increasing pain, redness. Follow up with [place] on Monday.

## 2014-02-02 ENCOUNTER — Telehealth: Payer: Self-pay | Admitting: *Deleted

## 2014-02-02 ENCOUNTER — Other Ambulatory Visit (INDEPENDENT_AMBULATORY_CARE_PROVIDER_SITE_OTHER): Payer: Self-pay

## 2014-02-02 ENCOUNTER — Ambulatory Visit (INDEPENDENT_AMBULATORY_CARE_PROVIDER_SITE_OTHER): Payer: Medicare Other | Admitting: Surgery

## 2014-02-02 VITALS — BP 142/76 | HR 68 | Temp 99.1°F | Resp 14 | Ht 66.0 in | Wt 207.0 lb

## 2014-02-02 DIAGNOSIS — N61 Mastitis without abscess: Secondary | ICD-10-CM

## 2014-02-02 DIAGNOSIS — C50219 Malignant neoplasm of upper-inner quadrant of unspecified female breast: Secondary | ICD-10-CM

## 2014-02-02 NOTE — Telephone Encounter (Signed)
Noted  

## 2014-02-02 NOTE — Progress Notes (Signed)
[place], MD,  [address]  [place], [place]    [place] Phone:  [phone] FAX:  [phone]   Re:   [name] DOB:   Mar 30, 1941 MRN:   [id number]  Urgent Office  ASSESSMENT AND PLAN: 1.  Right breast redness  (photo at end of chart)  Infection vs post radiation changes vs a combination of these  She is already on Clindamycin/doxycycline.  She is not febrile or tachycardic, so I'm not sure that there is much more to do right now.  Would continued current antibiotics and I will get her to see Drs. Marlou Starks (Dr. Marlou Starks is out for next week, so I will see her back next week) and Dr. Tammi Klippel next week for follow up.  I wrote her an additional prescription for doxycycline 100 mg, #14, to get her through being seen next week.  2.  Right breast cancer (T1a, N0)  ER and PR positive and HER-2/neu negative  Dr. Marlou Starks did a lumpectomy/SLNBx on 06/17/2013  She completed radiation to her right breast in October 2014.  She sees Dr. Humphrey Rolls - who has her on anastrozole.  To see Dr. Marlou Starks - her next appt with him is 02/20/2014 3.  History of left breast cancer - 2003  Has seen Dr. Pearletha Alfred in the past. 3.  Diabetes mellitus, insulin dependent  She has not been able to check her blood sugars recently because she is out of strips. 4.  Anticoagulated, on coumadin - history of DVT and PE.  HISTORY OF PRESENT ILLNESS: Chief Complaint  Patient presents with  . left  breast    [name] is a 73 y.o. (DOB: 01-26-1941)  white  female who is a patient of [name],[name], NP and comes to the urgent office today for right breast pain/infection. The patient comes by herself. She has had continued redness and pain in her right breast.  She says that this is worse over the last month.  She felt a lump in her right axilla, like an "egg".  She complains of general burning sensation in the breast.  She went to the [facility] on 01/27/2014. She saw Dr. Karle Starch, who aspirated fluid from her right axilla (10cc) which was serous (he did not think that it was infected).  He started her on doxycycline and cleocin.  She thinks it is about the same as last Friday - no better and no worse. She completed radiation tx, supervised by Dr. Gari Crown, to her right breast in October 2014.  As best I can tell, she did not have this redness when she last saw Dr. Marlou Starks in 10/18/2013.  Past Medical History  Diagnosis Date  . Hyperplastic colon polyp   . Diabetes mellitus type II   . Osteoarthritis   . GERD (gastroesophageal reflux disease)   . Osteopenia   . Lupus     of skin see dermatologist frequently  . OA (osteoarthritis of spine)     C-spine  . DVT (deep venous thrombosis)     right leg DVT 05-17-2008  . Leg swelling     left leg 2-10, u/s showed a old clot, has a hypercoag panel,  . History of cardiovascular stress test     10/09 had CP, stress test neg, likely GI (GERD)-09/2006-had CP: stress test (-)  . 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 DVT (deep vein thrombosis) in pregnancy   .  DVT of leg (deep venous thrombosis) 07/28/2011  . 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 Allergy   . Clotting disorder     hx of PE and DVT  . C. difficile diarrhea     hx of,   . Skin cancer     skin CA  . Left leg pain 03/12/2013  . Otitis externa 03/19/2013    right  . PONV (postoperative nausea and vomiting)   . Hypertension     pcp  dr Conley Canal   h.p   lebaur  . Breast cancer     left ductal breast ca dx 2003 approx, s/p XRt x 6 weeks, released from oncology (per  patient); dx right breast ca 2014    SOCIAL HISTORY: Patient is from [place]. She is not married and has no children.  PHYSICAL EXAM: BP 142/76  Pulse 68  Temp(Src) 99.1 F (37.3 C) (Oral)  Resp 14  Ht _0  (1.676 m)  Wt 207 lb (93.895 kg)  BMI 33.43 kg/m2  General:  Well nourished older woman who is alert and not febrile. HEENT:  Pupils  equal.  Dentition good. NECK:  Supple.  No thyroid mass. LYMPH NODES:  No cervical, supraclavicular, or axillary adenopathy. BREASTS -  RIGHT:  Red, warm, tender.  She is more tender in the right axilla.  I did an [place], but saw no fluid that needed drainage.  The breast is more indurate.  There is no localized area to drain.   LEFT:  No palpable mass or nodule.  No nipple discharge. UPPER EXTREMITIES:  No evidence of lymphedema.  I tried to [place] the right axilla, but I did not see any substantial fluid to drain.     DATA REVIEWED: Epic notes.  Alphonsa Overall, MD,  [facility], [address]  [place], [place]    [place] Phone:  [phone] FAX:  [phone]

## 2014-02-02 NOTE — Telephone Encounter (Signed)
[facility] called stating that Dr. Lucia Gaskins just saw the pt and requested for her to have an urgent f/u app w/ Dr. Tammi Klippel next week.  I changed referral in workque from Med Onc to [facility] and emailed Santiago Glad to make her aware.

## 2014-02-04 ENCOUNTER — Other Ambulatory Visit: Payer: Self-pay | Admitting: Family

## 2014-02-06 ENCOUNTER — Ambulatory Visit (INDEPENDENT_AMBULATORY_CARE_PROVIDER_SITE_OTHER): Payer: Medicare Other | Admitting: Family

## 2014-02-06 ENCOUNTER — Encounter: Payer: Self-pay | Admitting: Family

## 2014-02-06 VITALS — BP 132/62 | HR 67 | Temp 98.2°F | Ht 66.5 in | Wt 208.1 lb

## 2014-02-06 DIAGNOSIS — L538 Other specified erythematous conditions: Secondary | ICD-10-CM

## 2014-02-06 DIAGNOSIS — L304 Erythema intertrigo: Secondary | ICD-10-CM

## 2014-02-06 DIAGNOSIS — N61 Mastitis without abscess: Secondary | ICD-10-CM

## 2014-02-06 NOTE — Progress Notes (Signed)
Subjective:    Patient ID: [name], female    DOB: November 14, 1941, 73 y.o.   MRN: [id number]  HPI  [name] is a 73 yr old female who presents today for follow up of her right breast cellulitis. She was evaluated by Dr. Hassell Done last week who did not identify an obvious abscess in need of draining on ultrasound. She is continued on doxy and clindamycin.    Review of Systems See HPI  Past Medical History  Diagnosis Date  . Hyperplastic colon polyp   . Diabetes mellitus type II   . Osteoarthritis   . GERD (gastroesophageal reflux disease)   . Osteopenia   . Lupus     of skin see dermatologist frequently  . OA (osteoarthritis of spine)     C-spine  . DVT (deep venous thrombosis)     right leg DVT 05-17-2008  . Leg swelling     left leg 2-10, u/s showed a old clot, has a hypercoag panel,  . History of cardiovascular stress test     10/09 had CP, stress test neg, likely GI (GERD)-09/2006-had CP: stress test (-)  . 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 DVT (deep vein thrombosis) in pregnancy   . DVT of leg (deep venous thrombosis) 07/28/2011  . 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 Allergy   . Clotting disorder     hx of PE and DVT  . C. difficile diarrhea     hx of,   . Skin cancer     skin CA  . Left leg pain 03/12/2013  . Otitis externa 03/19/2013    right  . PONV (postoperative nausea and vomiting)   . Hypertension     pcp  dr Conley Canal   h.p   lebaur  . 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 Marital Status: Widowed    Spouse Name: N/A    Number of Children: 3  . Years of Education: N/A   Occupational History  . Retired    Social History Main Topics  . Smoking status: Never Smoker   . Smokeless tobacco: Never Used  . Alcohol Use: No  . Drug Use: No  . Sexual Activity: Not Currently   Other Topics Concern  . Not  on file   Social History Narrative   Single-widow    3 children , 1 in [place] (son, daughter - [place])    [place] to [place] from [place], [place]   Alcohol Use - no      tobacco-- never     Illicit Drug Use - no    Pt is Jehovah's Witness-No blood products   Daughter - Amity Roes           Past Surgical History  Procedure Laterality Date  . Cholecystectomy    . Breast biopsy      left breast and right breast  . Breast lumpectomy      left breast-ductal carcinoma in situ  . Appendectomy    . Skin cancer excision      from back   . Breast lumpectomy with needle localization and axillary sentinel lymph node bx Right 06/17/2013    Procedure: BREAST LUMPECTOMY WITH NEEDLE LOCALIZATION AND AXILLARY SENTINEL LYMPH NODE BX;  Surgeon: Merrie Roof, MD;  Location: [facility];  Service: General;  Laterality: Right;    Family History  Problem Relation Age of Onset  . Diabetes Mother   . Diabetes    . Heart attack Neg Hx   . Colon cancer Neg Hx   . Breast cancer Neg Hx   . Esophageal cancer Neg Hx   . Rectal cancer Neg Hx   . Stomach cancer Neg Hx   . Cancer Father     melanoma  . Diabetes Brother   . Other Brother     amputation    Allergies  Allergen Reactions  . Metformin Diarrhea  . Pioglitazone Other (See Comments)    REACTION: wt gain  . Vancomycin     Erythematous rash, hands and toes became cyanotic.      Current Outpatient Prescriptions on File Prior to Visit  Medication Sig Dispense Refill  . amLODipine (NORVASC) 5 MG tablet Take 1 tablet (5 mg total) by mouth daily.  30 tablet  1  . B Complex-C (SUPER B COMPLEX PO) Take 1 tablet by mouth daily.      . Blood Glucose Monitoring Suppl (FREESTYLE LITE) DEVI       . Calcium Carbonate-Vitamin D (CALCIUM-VITAMIN D) 600-200 MG-UNIT CAPS Take 1 tablet by mouth daily.       . clindamycin (CLEOCIN) 300 MG capsule Take 1 capsule (300 mg total) by mouth 4 (four) times daily.  40 capsule  0  . doxycycline (VIBRAMYCIN) 100  MG capsule Take 1 capsule (100 mg total) by mouth 2 (two) times daily.  20 capsule  0  . fluconazole (DIFLUCAN) 150 MG tablet One tablet by mouth today, then repeat in 3 days.  2 tablet  0  . FREESTYLE LITE test strip Use to check blood sugar 4 times a day.      . furosemide (LASIX) 20 MG tablet Take 20 mg by mouth daily as needed for fluid.      Marland Kitchen glucosamine-chondroitin 500-400 MG tablet Take 1 tablet by mouth daily.       . insulin glargine (LANTUS) 100 UNIT/ML injection Inject 0.4 mLs (40 Units total) into the skin at bedtime.  3 mL  0  . insulin regular (NOVOLIN R,HUMULIN R) 100 units/mL injection Inject into the skin 3 (three) times daily before meals. Sliding scale 20-32 units      . lisinopril (PRINIVIL,ZESTRIL) 40 MG tablet TAKE 1 TABLET (40 MG TOTAL) BY MOUTH DAILY.  90 tablet  1  . metoprolol succinate (TOPROL-XL) 50 MG 24 hr tablet 25 mg. Take 1 tablet by mouth daily      . niacin 500 MG tablet Take 500 mg by mouth daily.       Marland Kitchen nystatin (MYCOSTATIN/NYSTOP) 100000 UNIT/GM POWD Apply powder to rash in groin twice daily as needed.  30 g  2  . nystatin ointment (MYCOSTATIN) Apply 1 application topically 2 (two) times daily.  30 g  2  . ONETOUCH DELICA LANCETS FINE MISC Use to check blood sugar 4 times daily.      . traMADol (ULTRAM) 50 MG tablet Take 1 tablet (50 mg total) by mouth every 6 (six) hours as needed.  15 tablet  0  . warfarin (COUMADIN) 5 MG tablet Take 5-7.5 mg by mouth daily. Take 7.5mg  on Monday thru Friday, and 5mg t on Saturday and Sunday.      . Wound Cleansers (RADIAPLEX EX) Apply topically.       Current Facility-Administered Medications on File Prior to Visit  Medication Dose Route Frequency Provider Last Rate Last Dose  . silver sulfADIAZINE (SILVADENE) 1 % cream   Topical Daily Lora Paula, MD        BP 132/62  Pulse 67  Temp(Src) 98.2 F (36.8 C) (Oral)  Ht 5' 6.5" (1.689 m)  Wt 208 lb 1.3 oz (94.384 kg)  BMI 33.09 kg/m2  SpO2 98%         Objective:   Physical Exam  Constitutional: She is oriented to person, place, and time. She appears well-developed and well-nourished. No distress.  Cardiovascular: Regular rhythm.   Pulmonary/Chest: Breath sounds normal.  Erythema right breast is slightly lighter in color and has regressed slightly from the outline made last week. She continues to have firm induration under the right axilla/right outer breast which is tender without fluctuance.   Neurological: She is alert and oriented to person, place, and time.  Psychiatric: She has a normal mood and affect. Her behavior is normal. Thought content normal.  skin: erythema beneath left abdominal skin fold is improved but not yet resolved.        Assessment & Plan:

## 2014-02-06 NOTE — Assessment & Plan Note (Signed)
Improving following diflucan. Continue topical nystatin.

## 2014-02-06 NOTE — Patient Instructions (Signed)
Continue doxycycline (obtain refill from rx written by Dr. Lucia Gaskins), continue clindamycin. Follow up here on Friday. Keep your appointment with Dr. Tammi Klippel on Thursday. Call us if increased pain/swelling/redness of the right breast.

## 2014-02-06 NOTE — Assessment & Plan Note (Signed)
Improving- though slowly.  I recommended that she keep her coumadin clinic appointment tomorrow due to multiple abx and that she continue doxy and clinda. Follow up in our office on Friday and keep apt on 3/26 with Dr. Tammi Klippel.  Continue probiotic.

## 2014-02-06 NOTE — Progress Notes (Signed)
Pre visit review using our clinic review tool, if applicable. No additional management support is needed unless otherwise documented below in the visit note. 

## 2014-02-07 ENCOUNTER — Telehealth: Payer: Self-pay | Admitting: Radiation Oncology

## 2014-02-07 NOTE — Telephone Encounter (Addendum)
Phoned patient to remind her of appointment on Thursday, March 26. Patient confirms she will be present for appointment. Explained Dr. Lucia Gaskins really wants Dr. Tammi Klippel to evaluate the redness and irritation in her treated breast. She reports there is an are under her arm "that feels like a hard boiled egg, my nipple is so hard, and my breast feels like it is burning..it feels like the worst sunburn ever." Reports that she is taking her antibiotics as prescribed. Explains she saw her PCP yesterday who see improvement but, the patient says "I am just no sure."

## 2014-02-09 ENCOUNTER — Ambulatory Visit
Admission: RE | Admit: 2014-02-09 | Discharge: 2014-02-09 | Disposition: A | Discharge status: Home / Self Care | Payer: Medicare Other | Source: Ambulatory Visit | Attending: Radiation Oncology | Admitting: Radiation Oncology

## 2014-02-09 ENCOUNTER — Encounter: Payer: Self-pay | Admitting: Radiation Oncology

## 2014-02-09 ENCOUNTER — Encounter (INDEPENDENT_AMBULATORY_CARE_PROVIDER_SITE_OTHER): Payer: Medicare Other | Admitting: Surgery

## 2014-02-09 VITALS — BP 173/60 | HR 68 | Temp 98.4°F | Resp 16 | Wt 205.0 lb

## 2014-02-09 DIAGNOSIS — D0590 Unspecified type of carcinoma in situ of unspecified breast: Secondary | ICD-10-CM

## 2014-02-09 DIAGNOSIS — C50219 Malignant neoplasm of upper-inner quadrant of unspecified female breast: Secondary | ICD-10-CM

## 2014-02-09 DIAGNOSIS — N61 Mastitis without abscess: Secondary | ICD-10-CM

## 2014-02-09 MED ORDER — FLUCONAZOLE 100 MG PO TABS: 100.0000 mg | ORAL_TABLET | Freq: Every day | ORAL | Status: DC

## 2014-02-09 MED ORDER — NYSTATIN 100000 UNIT/GM EX POWD: CUTANEOUS | Status: DC

## 2014-02-09 NOTE — Progress Notes (Signed)
Patient referred back to Dr. Tammi Klippel from Dr. Lucia Gaskins to evaluate the redness and irritation in her treated right breast. She reports there is an are under her arm "that feels like a hard boiled egg, my nipple is so hard, and my breast feels like it is burning..it feels like the worst sunburn ever." Reports that she is taking her antibiotics, clindamycin and doxycycline,  as prescribed. Vitals WDL. Reports dizziness. Reports she recently fell and her "left leg is really swollen." Denies nausea, vomiting, headache or diarrhea. Denies night sweats or weight loss. Reports a normal appetite. No bilateral upper edema noted.

## 2014-02-09 NOTE — Progress Notes (Signed)
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   She complains of general burning sensation in the breast. [facility] on 01/27/2014. She saw Dr. Karle Starch, who aspirated fluid from her right axilla (10cc) which was serous (he did not think that it was infected). He started her on doxycycline and cleocin.  She saw Dr. Lucia Gaskins on 02/02/14 who described and photographed this:   The patient is still in discomfort and has some new peeling in the axilla                              ALLERGIES:  is allergic to metformin; pioglitazone; and vancomycin.  Meds: Current Outpatient Prescriptions  Medication Sig Dispense Refill  . amLODipine (NORVASC) 5 MG tablet Take 1 tablet (5 mg total) by mouth daily.  30 tablet  1  . anastrozole (ARIMIDEX) 1 MG tablet Take 1 mg by mouth daily.      . B Complex-C (SUPER B COMPLEX PO) Take 1 tablet by mouth daily.      . Blood Glucose Monitoring Suppl (FREESTYLE LITE) DEVI       . Calcium Carbonate-Vitamin D (CALCIUM-VITAMIN D) 600-200 MG-UNIT CAPS Take 1 tablet by mouth daily.       . clindamycin (CLEOCIN) 300 MG capsule Take 1 capsule (300 mg total) by mouth 4 (four) times daily.  40 capsule  0  . doxycycline (VIBRAMYCIN) 100 MG capsule Take 1 capsule (100 mg total) by mouth 2 (two) times daily.  20 capsule  0  . FREESTYLE LITE test strip Use to check blood sugar 4 times a day.      Marland Kitchen glucosamine-chondroitin 500-400 MG tablet  Take 1 tablet by mouth daily.       . insulin glargine (LANTUS) 100 UNIT/ML injection Inject 0.4 mLs (40 Units total) into the skin at bedtime.  3 mL  0  . insulin regular (NOVOLIN R,HUMULIN R) 100 units/mL injection Inject into the skin 3 (three) times daily before meals. Sliding scale 20-32 units      . lisinopril (PRINIVIL,ZESTRIL) 40 MG tablet TAKE 1 TABLET (40 MG TOTAL) BY MOUTH DAILY.  90 tablet  1  . metoprolol succinate (TOPROL-XL) 50 MG 24 hr tablet 25 mg. Take 1 tablet by mouth daily      . niacin 500 MG tablet Take 500 mg by mouth daily.       Glory Rosebush DELICA LANCETS FINE MISC Use to check blood sugar 4 times daily.      . Probiotic Product (PROBIOTIC DAILY PO) Take by mouth.      . warfarin (COUMADIN) 5 MG tablet Take 5-7.5 mg by mouth daily. Take 7.5mg  on Monday thru Friday, and 5mg t on Saturday and Sunday.      . fluconazole (DIFLUCAN) 150 MG tablet One tablet by mouth today, then repeat in 3 days.  2 tablet  0  .  furosemide (LASIX) 20 MG tablet Take 20 mg by mouth daily as needed for fluid.      Marland Kitchen nystatin (MYCOSTATIN/NYSTOP) 100000 UNIT/GM POWD Apply powder to rash in groin twice daily as needed.  30 g  2  . nystatin ointment (MYCOSTATIN) Apply 1 application topically 2 (two) times daily.  30 g  2  . traMADol (ULTRAM) 50 MG tablet Take 1 tablet (50 mg total) by mouth every 6 (six) hours as needed.  15 tablet  0  . Wound Cleansers (RADIAPLEX EX) Apply topically.       No current facility-administered medications for this encounter.   Facility-Administered Medications Ordered in Other Encounters  Medication Dose Route Frequency Provider Last Rate Last Dose  . silver sulfADIAZINE (SILVADENE) 1 % cream   Topical Daily Lora Paula, MD        Physical Findings: The patient is in no acute distress. Patient is alert and oriented.  weight is 205 lb (92.987 kg). Her oral temperature is 98.4 F (36.9 C). Her blood pressure is 173/60 and her pulse is 68. Her respiration is 16  and oxygen saturation is 100%.    Based on comparison with Dr. Pollie Friar photo, the erythema appears to potentially be slightly improved over the most of the breast. However, there is remaining brisk erythema in the region of the axilla as well as some peeling. There is no drainage. There is no brawny erysepeloid edge despite some peau d'orange of changes  No significant changes.  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 The response to antibiotics has been limited, suggesting a fungal pathogen. This is not uncommon following radiotherapy and the patient does have a history of topical fungal infections, including one during the course of her external beam radiotherapy.  Plan:  Today, I electronically prescribed Diflucan for a ten-day course lung topical nystatin will followup visit in the radiation oncology clinic in 6 to assess response. The patient is meeting with her primary care physician tomorrow. I will defer any interactions between fluconazole and her warfarin or other medications to Dr. Inda Castle. If the patient's skin does not improve prior to the April 6 skin check, we will consider dermatology referral or possibly punch biopsy.  _____________________________________  Sheral Apley Tammi Klippel, M.D.

## 2014-02-09 NOTE — Progress Notes (Signed)
See progress note under physician encounter. 

## 2014-02-10 ENCOUNTER — Telehealth: Payer: Self-pay | Admitting: Family

## 2014-02-10 ENCOUNTER — Ambulatory Visit (INDEPENDENT_AMBULATORY_CARE_PROVIDER_SITE_OTHER): Payer: Medicare Other | Admitting: Family

## 2014-02-10 ENCOUNTER — Encounter: Payer: Self-pay | Admitting: Family

## 2014-02-10 VITALS — BP 140/70 | HR 67 | Temp 98.2°F | Resp 18 | Ht 66.5 in | Wt 205.1 lb

## 2014-02-10 DIAGNOSIS — L0390 Cellulitis, unspecified: Principal | ICD-10-CM

## 2014-02-10 DIAGNOSIS — N61 Mastitis without abscess: Secondary | ICD-10-CM

## 2014-02-10 DIAGNOSIS — L0291 Cutaneous abscess, unspecified: Secondary | ICD-10-CM

## 2014-02-10 MED ORDER — CLINDAMYCIN HCL 300 MG PO CAPS: 300.0000 mg | ORAL_CAPSULE | Freq: Four times a day (QID) | ORAL | Status: DC

## 2014-02-10 NOTE — Patient Instructions (Addendum)
Please follow up with Dr. Charlett Blake on Tuesday. Continue clindamycin and Doxycycline. A clindamycin refill has been sent to your pharmacy.

## 2014-02-10 NOTE — Telephone Encounter (Signed)
Please contact pt's coumadin clinic at [facility] today and inform them that pt is now on diflucan for 7 days. She will need PT/INR drawn  Early next week

## 2014-02-10 NOTE — Assessment & Plan Note (Signed)
Slowly improving with doxy/clinda. On diflucan per Dr. Tammi Klippel. Will notify her coumadin clinic so that can get her in sooner for PT/INR.  Continue abx.  Abscess drainage was swabbed and sent for culture. Case was reviewed with Dr. Charlett Blake who will see pt in follow up next week as I am out of the office.

## 2014-02-10 NOTE — Telephone Encounter (Signed)
Spoke with Randal Buba at [facility] and she states pt made them aware of this yesterday.

## 2014-02-10 NOTE — Progress Notes (Signed)
Pre visit review using our clinic review tool, if applicable. No additional management support is needed unless otherwise documented below in the visit note. 

## 2014-02-10 NOTE — Progress Notes (Signed)
Subjective:    Patient ID: [name], female    DOB: 30-Dec-1940, 73 y.o.   MRN: [id number]  HPI  [name] is a 73 yr old female who presents today for follow up of right breast cellulitis. She was seen yesterday by Dr. Tammi Klippel, her radiation oncologist, who placed her on diflucan for possible fungal celllulitis.  She is continued on clindaymycin and doxycycline.  Notes continued pain. Reports that last night she developed some drainage from the right axilla.    Review of Systems See HPI  Past Medical History  Diagnosis Date  . Hyperplastic colon polyp   . Diabetes mellitus type II   . Osteoarthritis   . GERD (gastroesophageal reflux disease)   . Osteopenia   . Lupus     of skin see dermatologist frequently  . OA (osteoarthritis of spine)     C-spine  . DVT (deep venous thrombosis)     right leg DVT 05-17-2008  . Leg swelling     left leg 2-10, u/s showed a old clot, has a hypercoag panel,  . History of cardiovascular stress test     10/09 had CP, stress test neg, likely GI (GERD)-09/2006-had CP: stress test (-)  . 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 DVT (deep vein thrombosis) in pregnancy   . DVT of leg (deep venous thrombosis) 07/28/2011  . 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 Allergy   . Clotting disorder     hx of PE and DVT  . C. difficile diarrhea     hx of,   . Skin cancer     skin CA  . Left leg pain 03/12/2013  . Otitis externa 03/19/2013    right  . PONV (postoperative nausea and vomiting)   . Hypertension     pcp  dr Conley Canal   h.p   lebaur  . 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 Marital Status: Widowed    Spouse Name: N/A    Number of Children: 3  . Years of Education: N/A   Occupational History  . Retired    Social History Main Topics  . Smoking status: Never Smoker   . Smokeless tobacco: Never  Used  . Alcohol Use: No  . Drug Use: No  . Sexual Activity: Not Currently   Other Topics Concern  . Not on file   Social History Narrative   Single-widow    3 children , 1 in [place] (son, Kelsey - [place])    [place] to [place] 2002 from [place], [place]   Alcohol Use - no      tobacco-- never     Illicit Drug Use - no    Pt is Jehovah's Witness-No blood products   [name]           Past Surgical History  Procedure Laterality Date  . Cholecystectomy    . Breast biopsy      left breast and right breast  . Breast lumpectomy      left breast-ductal carcinoma in situ  . Appendectomy    . Skin cancer excision      from back   . Breast lumpectomy with needle localization and axillary sentinel lymph node bx Right 06/17/2013    Procedure: BREAST LUMPECTOMY WITH NEEDLE LOCALIZATION  AND AXILLARY SENTINEL LYMPH NODE BX;  Surgeon: Merrie Roof, MD;  Location: [facility];  Service: General;  Laterality: Right;    Family History  Problem Relation Age of Onset  . Diabetes Mother   . Diabetes    . Heart attack Neg Hx   . Colon cancer Neg Hx   . Breast cancer Neg Hx   . Esophageal cancer Neg Hx   . Rectal cancer Neg Hx   . Stomach cancer Neg Hx   . Cancer Father     melanoma  . Diabetes Brother   . Other Brother     amputation    Allergies  Allergen Reactions  . Metformin Diarrhea  . Pioglitazone Other (See Comments)    REACTION: wt gain  . Vancomycin     Erythematous rash, hands and toes became cyanotic.      Current Outpatient Prescriptions on File Prior to Visit  Medication Sig Dispense Refill  . amLODipine (NORVASC) 5 MG tablet Take 1 tablet (5 mg total) by mouth daily.  30 tablet  1  . anastrozole (ARIMIDEX) 1 MG tablet Take 1 mg by mouth daily.      . B Complex-C (SUPER B COMPLEX PO) Take 1 tablet by mouth daily.      . Blood Glucose Monitoring Suppl (FREESTYLE LITE) DEVI       . Calcium Carbonate-Vitamin D (CALCIUM-VITAMIN D) 600-200 MG-UNIT CAPS Take 1  tablet by mouth daily.       Marland Kitchen doxycycline (VIBRAMYCIN) 100 MG capsule Take 1 capsule (100 mg total) by mouth 2 (two) times daily.  20 capsule  0  . fluconazole (DIFLUCAN) 100 MG tablet Take 1 tablet (100 mg total) by mouth daily. Two on first day then once daily for 10 days total  11 tablet  0  . FREESTYLE LITE test strip Use to check blood sugar 4 times a day.      . furosemide (LASIX) 20 MG tablet Take 20 mg by mouth daily as needed for fluid.      Marland Kitchen glucosamine-chondroitin 500-400 MG tablet Take 1 tablet by mouth daily.       . insulin glargine (LANTUS) 100 UNIT/ML injection Inject 0.4 mLs (40 Units total) into the skin at bedtime.  3 mL  0  . insulin regular (NOVOLIN R,HUMULIN R) 100 units/mL injection Inject into the skin 3 (three) times daily before meals. Sliding scale 20-32 units      . lisinopril (PRINIVIL,ZESTRIL) 40 MG tablet TAKE 1 TABLET (40 MG TOTAL) BY MOUTH DAILY.  90 tablet  1  . metoprolol succinate (TOPROL-XL) 50 MG 24 hr tablet 25 mg. Take 1 tablet by mouth daily      . niacin 500 MG tablet Take 500 mg by mouth daily.       Marland Kitchen nystatin (MYCOSTATIN/NYSTOP) 100000 UNIT/GM POWD Apply powder to breast twice daily for 10 days.  60 g  2  . nystatin ointment (MYCOSTATIN) Apply 1 application topically 2 (two) times daily.  30 g  2  . ONETOUCH DELICA LANCETS FINE MISC Use to check blood sugar 4 times daily.      . Probiotic Product (PROBIOTIC DAILY PO) Take by mouth.      . traMADol (ULTRAM) 50 MG tablet Take 1 tablet (50 mg total) by mouth every 6 (six) hours as needed.  15 tablet  0  . warfarin (COUMADIN) 5 MG tablet Take 5-7.5 mg by mouth daily. Take 7.5mg  on Monday thru Friday, and 5mg t  on Saturday and Sunday.      . Wound Cleansers (RADIAPLEX EX) Apply topically.       Current Facility-Administered Medications on File Prior to Visit  Medication Dose Route Frequency Provider Last Rate Last Dose  . silver sulfADIAZINE (SILVADENE) 1 % cream   Topical Daily Lora Paula, MD          BP 140/70  Pulse 67  Temp(Src) 98.2 F (36.8 C) (Oral)  Resp 18  Ht 5' 6.5" (1.689 m)  Wt 205 lb 1.9 oz (93.042 kg)  BMI 32.62 kg/m2  SpO2 99%       Objective:   Physical Exam  Constitutional: She appears well-developed and well-nourished. No distress.  Skin:  She continue to has erythema and induration near the right axilla. Small amount of drainage is noted and is sent for culture today.  Swelling and erythema of the right breast is improved but not resolved.           Assessment & Plan:

## 2014-02-13 ENCOUNTER — Telehealth (INDEPENDENT_AMBULATORY_CARE_PROVIDER_SITE_OTHER): Payer: Self-pay

## 2014-02-13 LAB — WOUND CULTURE
GRAM STAIN: NONE SEEN
Gram Stain: NONE SEEN

## 2014-02-13 NOTE — Telephone Encounter (Addendum)
Called patient to follow up on  Appointment that was  cancelled 02-09-14 , she Patient states she still has pain and swelling  her right axillary area  and  she  is seeing DR. Tammi Klippel and she is on antibiotics so she feels she does not need to follow up with two MD's because she has a co-pay which she cannot afford . I advised she would need to follow up with DR. Lucia Gaskins , patient states she rather be seen by DR. Marlou Starks, I advised her he would not have an appointment until 03-02-14 @440p  Which she  accepted. I advised her to call if her condition becomes worse.

## 2014-02-14 ENCOUNTER — Encounter: Payer: Self-pay | Admitting: Family Medicine

## 2014-02-14 ENCOUNTER — Ambulatory Visit (INDEPENDENT_AMBULATORY_CARE_PROVIDER_SITE_OTHER): Payer: Medicare Other | Admitting: Family Medicine

## 2014-02-14 VITALS — BP 142/62 | HR 76 | Temp 98.8°F | Ht 66.5 in | Wt 206.0 lb

## 2014-02-14 DIAGNOSIS — R32 Unspecified urinary incontinence: Secondary | ICD-10-CM

## 2014-02-14 DIAGNOSIS — N611 Abscess of the breast and nipple: Secondary | ICD-10-CM

## 2014-02-14 DIAGNOSIS — M549 Dorsalgia, unspecified: Secondary | ICD-10-CM

## 2014-02-14 DIAGNOSIS — I10 Essential (primary) hypertension: Secondary | ICD-10-CM

## 2014-02-14 DIAGNOSIS — N61 Mastitis without abscess: Secondary | ICD-10-CM

## 2014-02-14 NOTE — Patient Instructions (Signed)
Abscess An abscess is an infected area that contains a collection of pus and debris.It can occur in almost any part of the body. An abscess is also known as a furuncle or boil. CAUSES  An abscess occurs when tissue gets infected. This can occur from blockage of oil or sweat glands, infection of hair follicles, or a minor injury to the skin. As the body tries to fight the infection, pus collects in the area and creates pressure under the skin. This pressure causes pain. People with weakened immune systems have difficulty fighting infections and get certain abscesses more often.  SYMPTOMS Usually an abscess develops on the skin and becomes a painful mass that is red, warm, and tender. If the abscess forms under the skin, you may feel a moveable soft area under the skin. Some abscesses break open (rupture) on their own, but most will continue to get worse without care. The infection can spread deeper into the body and eventually into the bloodstream, causing you to feel ill.  DIAGNOSIS  Your caregiver will take your medical history and perform a physical exam. A sample of fluid may also be taken from the abscess to determine what is causing your infection. TREATMENT  Your caregiver may prescribe antibiotic medicines to fight the infection. However, taking antibiotics alone usually does not cure an abscess. Your caregiver may need to make a small cut (incision) in the abscess to drain the pus. In some cases, gauze is packed into the abscess to reduce pain and to continue draining the area. HOME CARE INSTRUCTIONS   Only take over-the-counter or prescription medicines for pain, discomfort, or fever as directed by your caregiver.  If you were prescribed antibiotics, take them as directed. Finish them even if you start to feel better.  If gauze is used, follow your caregiver's directions for changing the gauze.  To avoid spreading the infection:  Keep your draining abscess covered with a  bandage.  Wash your hands well.  Do not share personal care items, towels, or whirlpools with others.  Avoid skin contact with others.  Keep your skin and clothes clean around the abscess.  Keep all follow-up appointments as directed by your caregiver. SEEK MEDICAL CARE IF:   You have increased pain, swelling, redness, fluid drainage, or bleeding.  You have muscle aches, chills, or a general ill feeling.  You have a fever. MAKE SURE YOU:   Understand these instructions.  Will watch your condition.  Will get help right away if you are not doing well or get worse. Document Released: 08/13/2005 Document Revised: 05/04/2012 Document Reviewed: 01/16/2012 Exit[employer] Patient Information 2014 [employer].  

## 2014-02-14 NOTE — Progress Notes (Signed)
Pre visit review using our clinic review tool, if applicable. No additional management support is needed unless otherwise documented below in the visit note. 

## 2014-02-15 ENCOUNTER — Encounter: Payer: Self-pay | Admitting: Family Medicine

## 2014-02-15 ENCOUNTER — Other Ambulatory Visit: Payer: Self-pay | Admitting: Family

## 2014-02-15 NOTE — Progress Notes (Signed)
Patient ID: RICHARD HOLZ, female   DOB: February 18, 1941, 73 y.o.   MRN: [id number] KEWANA SANON [id number] 1941-07-30 02/15/2014      Progress Note-Follow Up  Subjective  Chief Complaint  Chief Complaint  Patient presents with  . Follow-up    per Melissa    HPI  Patient is a 73 year old Caucasian female who is in today with persistent breast infection and pain. She has been taking her antibiotics as prescribed and attempting to keep the area clean and dry and unfortunately her pain, fluctuance and drainage persists. Her daughter is with her today and notes they have expelled a scant amount of pus each day but the lesion has persisted. Patient is complaining of pain, heavy fatigue, myalgias and weakness. She denies fevers, chills, anorexia. She is noting that only breast pain but back pain in that general region as well. She denies shortness or breath GI or GU complaints otherwise. He was noting symptoms recent increase in low back pain.  Past Medical History  Diagnosis Date  . Hyperplastic colon polyp   . Diabetes mellitus type II   . Osteoarthritis   . GERD (gastroesophageal reflux disease)   . Osteopenia   . Lupus     of skin see dermatologist frequently  . OA (osteoarthritis of spine)     C-spine  . DVT (deep venous thrombosis)     right leg DVT 05-17-2008  . Leg swelling     left leg 2-10, u/s showed a old clot, has a hypercoag panel,  . History of cardiovascular stress test     10/09 had CP, stress test neg, likely GI (GERD)-09/2006-had CP: stress test (-)  . 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 DVT (deep vein thrombosis) in pregnancy   . DVT of leg (deep venous thrombosis) 07/28/2011  . 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 Allergy   . Clotting disorder     hx of PE and DVT  . C. difficile diarrhea     hx of,   . Skin cancer     skin CA  . Left leg pain 03/12/2013  . Otitis externa 03/19/2013    right  . PONV (postoperative nausea and vomiting)   .  Hypertension     pcp  dr Conley Canal   h.p   lebaur  . 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 Cholecystectomy    . Breast biopsy      left breast and right breast  . Breast lumpectomy      left breast-ductal carcinoma in situ  . Appendectomy    . Skin cancer excision      from back   . Breast lumpectomy with needle localization and axillary sentinel lymph node bx Right 06/17/2013    Procedure: BREAST LUMPECTOMY WITH NEEDLE LOCALIZATION AND AXILLARY SENTINEL LYMPH NODE BX;  Surgeon: Merrie Roof, MD;  Location: [place];  Service: General;  Laterality: Right;    Family History  Problem Relation Age of Onset  . Diabetes Mother   . Diabetes    . Heart attack Neg Hx   . Colon cancer Neg Hx   . Breast cancer Neg Hx   . Esophageal cancer Neg Hx   . Rectal cancer Neg Hx   . Stomach cancer Neg Hx   .  Cancer Father     melanoma  . Diabetes Brother   . Other Brother     amputation    History   Social History  . Marital Status: Widowed    Spouse Name: N/A    Number of Children: 3  . Years of Education: N/A   Occupational History  . Retired    Social History Main Topics  . Smoking status: Never Smoker   . Smokeless tobacco: Never Used  . Alcohol Use: No  . Drug Use: No  . Sexual Activity: Not Currently   Other Topics Concern  . Not on file   Social History Narrative   Single-widow    3 children , 1 in [place] (son, daughter - [place])    [place] to [place] from [place], [place]   Alcohol Use - no      tobacco-- never     Illicit Drug Use - no    Pt is Jehovah's Witness-No blood products   Daughter - Juventino Slovak           Current Outpatient Prescriptions on File Prior to Visit  Medication Sig Dispense Refill  . amLODipine (NORVASC) 5 MG tablet Take 1 tablet (5 mg total) by mouth daily.  30 tablet  1  .  anastrozole (ARIMIDEX) 1 MG tablet Take 1 mg by mouth daily.      . B Complex-C (SUPER B COMPLEX PO) Take 1 tablet by mouth daily.      . Blood Glucose Monitoring Suppl (FREESTYLE LITE) DEVI       . Calcium Carbonate-Vitamin D (CALCIUM-VITAMIN D) 600-200 MG-UNIT CAPS Take 1 tablet by mouth daily.       . clindamycin (CLEOCIN) 300 MG capsule Take 1 capsule (300 mg total) by mouth 4 (four) times daily.  28 capsule  0  . doxycycline (VIBRAMYCIN) 100 MG capsule Take 1 capsule (100 mg total) by mouth 2 (two) times daily.  20 capsule  0  . fluconazole (DIFLUCAN) 100 MG tablet Take 1 tablet (100 mg total) by mouth daily. Two on first day then once daily for 10 days total  11 tablet  0  . FREESTYLE LITE test strip Use to check blood sugar 4 times a day.      . furosemide (LASIX) 20 MG tablet Take 20 mg by mouth daily as needed for fluid.      Marland Kitchen glucosamine-chondroitin 500-400 MG tablet Take 1 tablet by mouth daily.       . insulin glargine (LANTUS) 100 UNIT/ML injection Inject 0.4 mLs (40 Units total) into the skin at bedtime.  3 mL  0  . insulin regular (NOVOLIN R,HUMULIN R) 100 units/mL injection Inject into the skin 3 (three) times daily before meals. Sliding scale 20-32 units      . lisinopril (PRINIVIL,ZESTRIL) 40 MG tablet TAKE 1 TABLET (40 MG TOTAL) BY MOUTH DAILY.  90 tablet  1  . metoprolol succinate (TOPROL-XL) 50 MG 24 hr tablet 25 mg. Take 1 tablet by mouth daily      . niacin 500 MG tablet Take 500 mg by mouth daily.       Marland Kitchen nystatin (MYCOSTATIN/NYSTOP) 100000 UNIT/GM POWD Apply powder to breast twice daily for 10 days.  60 g  2  . nystatin ointment (MYCOSTATIN) Apply 1 application topically 2 (two) times daily.  30 g  2  . ONETOUCH DELICA LANCETS FINE MISC Use to check blood sugar 4 times daily.      . Probiotic  Product (PROBIOTIC DAILY PO) Take by mouth.      . traMADol (ULTRAM) 50 MG tablet Take 1 tablet (50 mg total) by mouth every 6 (six) hours as needed.  15 tablet  0  . warfarin  (COUMADIN) 5 MG tablet Take 5-7.5 mg by mouth daily. Take 7.5mg  on Monday thru Friday, and 5mg t on Saturday and Sunday.      . Wound Cleansers (RADIAPLEX EX) Apply topically.       Current Facility-Administered Medications on File Prior to Visit  Medication Dose Route Frequency Provider Last Rate Last Dose  . silver sulfADIAZINE (SILVADENE) 1 % cream   Topical Daily Lora Paula, MD        Allergies  Allergen Reactions  . Metformin Diarrhea  . Pioglitazone Other (See Comments)    REACTION: wt gain  . Vancomycin     Erythematous rash, hands and toes became cyanotic.      Review of Systems  Review of Systems  Constitutional: Positive for malaise/fatigue. Negative for fever.  HENT: Negative for congestion.   Eyes: Negative for discharge.  Respiratory: Negative for shortness of breath.   Cardiovascular: Positive for chest pain. Negative for palpitations and leg swelling.       Right breast pain, right back pain   Gastrointestinal: Negative for nausea, abdominal pain and diarrhea.  Genitourinary: Negative for dysuria.  Musculoskeletal: Positive for myalgias. Negative for falls.  Skin: Negative for rash.  Neurological: Positive for weakness. Negative for loss of consciousness and headaches.  Endo/Heme/Allergies: Negative for polydipsia.  Psychiatric/Behavioral: Negative for depression and suicidal ideas. The patient is not nervous/anxious and does not have insomnia.     Objective  BP 142/62  Pulse 76  Temp(Src) 98.8 F (37.1 C) (Oral)  Ht 5' 6.5" (1.689 m)  Wt 206 lb (93.441 kg)  BMI 32.76 kg/m2  SpO2 97%  Physical Exam  Physical Exam  Constitutional: She is oriented to person, place, and time and well-developed, well-nourished, and in no distress. No distress.  HENT:  Head: Normocephalic and atraumatic.  Eyes: Conjunctivae are normal.  Neck: Neck supple. No thyromegaly present.  Cardiovascular: Normal rate, regular rhythm and normal heart sounds.   No murmur  heard. Pulmonary/Chest: Effort normal. She has no wheezes.  Abdominal: She exhibits no distension and no mass.  Genitourinary:  Right breast outer aspect erythematous and fluctuant. Abscess noted Right upper outer quadrant. Scant drainage after cleansing. Very tender.   Musculoskeletal: She exhibits no edema.  Lymphadenopathy:    She has no cervical adenopathy.  Neurological: She is alert and oriented to person, place, and time.  Skin: Skin is warm and dry. No rash noted. She is not diaphoretic.  Psychiatric: Memory, affect and judgment normal.    Lab Results  Component Value Date   TSH 1.467 04/06/2012   Lab Results  Component Value Date   WBC 5.8 12/12/2013   HGB 12.2 12/12/2013   HCT 37.5 12/12/2013   MCV 83.8 12/12/2013   PLT 163 12/12/2013   Lab Results  Component Value Date   CREATININE 0.9 12/12/2013   BUN 11.0 12/12/2013   NA 141 12/12/2013   K 4.2 12/12/2013   CL 105 07/22/2013   CO2 27 12/12/2013   Lab Results  Component Value Date   ALT 31 12/12/2013   AST 28 12/12/2013   ALKPHOS 92 12/12/2013   BILITOT 0.60 12/12/2013   Lab Results  Component Value Date   CHOL 165 07/22/2013   Lab Results  Component Value  Date   HDL 31* 07/22/2013   Lab Results  Component Value Date   LDLCALC 96 07/22/2013   Lab Results  Component Value Date   TRIG 189* 07/22/2013   Lab Results  Component Value Date   CHOLHDL 5.3 07/22/2013     Assessment & Plan  Cellulitis of female breast Patient with worsening cellulitis in right breast with abscess formation. Not responding adequately to Clindamycin and Doxycycline at this time. Needs evaluation urgently by surgical for possible debridement. Will arrange. Encouraged to seek care if symptoms worsen prior to debridement. If increased fever, pain, swelling etc  HYPERTENSION Well controlled, no changes to meds. Encouraged heart healthy diet such as the DASH diet and exercise as tolerated.

## 2014-02-15 NOTE — Assessment & Plan Note (Signed)
Well controlled, no changes to meds. Encouraged heart healthy diet such as the DASH diet and exercise as tolerated.  °

## 2014-02-15 NOTE — Telephone Encounter (Signed)
Patient called back questioning if she needs doxyclicline, she states that she is out of this medication. She states that she has 14 pills left of another medication. She is very confused as to which medication she needs to be taking.

## 2014-02-15 NOTE — Assessment & Plan Note (Signed)
Patient with worsening cellulitis in right breast with abscess formation. Not responding adequately to Clindamycin and Doxycycline at this time. Needs evaluation urgently by surgical for possible debridement. Will arrange. Encouraged to seek care if symptoms worsen prior to debridement. If increased fever, pain, swelling etc

## 2014-02-15 NOTE — Telephone Encounter (Signed)
Patient called and states that she is also out of doxycliclin and needs a another refill sent

## 2014-02-16 ENCOUNTER — Inpatient Hospital Stay (HOSPITAL_COMMUNITY)
Admission: AD | Admit: 2014-02-16 | Discharge: 2014-02-20 | DRG: 603 | Disposition: A | Discharge status: Home / Self Care | Payer: Medicare Other | Source: Ambulatory Visit | Attending: General Surgery | Admitting: General Surgery

## 2014-02-16 ENCOUNTER — Telehealth (INDEPENDENT_AMBULATORY_CARE_PROVIDER_SITE_OTHER): Payer: Self-pay

## 2014-02-16 ENCOUNTER — Encounter (INDEPENDENT_AMBULATORY_CARE_PROVIDER_SITE_OTHER): Payer: Self-pay | Admitting: Surgery

## 2014-02-16 ENCOUNTER — Telehealth: Payer: Self-pay | Admitting: Family

## 2014-02-16 ENCOUNTER — Ambulatory Visit (INDEPENDENT_AMBULATORY_CARE_PROVIDER_SITE_OTHER): Payer: Medicare Other | Admitting: Surgery

## 2014-02-16 ENCOUNTER — Encounter (HOSPITAL_COMMUNITY): Payer: Self-pay

## 2014-02-16 VITALS — BP 126/82 | HR 72 | Temp 97.5°F | Resp 16 | Ht 66.0 in | Wt 200.0 lb

## 2014-02-16 DIAGNOSIS — M479 Spondylosis, unspecified: Secondary | ICD-10-CM | POA: Diagnosis present

## 2014-02-16 DIAGNOSIS — Z86718 Personal history of other venous thrombosis and embolism: Secondary | ICD-10-CM

## 2014-02-16 DIAGNOSIS — K219 Gastro-esophageal reflux disease without esophagitis: Secondary | ICD-10-CM | POA: Diagnosis present

## 2014-02-16 DIAGNOSIS — N61 Mastitis without abscess: Secondary | ICD-10-CM

## 2014-02-16 DIAGNOSIS — Z86711 Personal history of pulmonary embolism: Secondary | ICD-10-CM

## 2014-02-16 DIAGNOSIS — I10 Essential (primary) hypertension: Secondary | ICD-10-CM | POA: Diagnosis present

## 2014-02-16 DIAGNOSIS — M329 Systemic lupus erythematosus, unspecified: Secondary | ICD-10-CM | POA: Diagnosis present

## 2014-02-16 DIAGNOSIS — M949 Disorder of cartilage, unspecified: Secondary | ICD-10-CM

## 2014-02-16 DIAGNOSIS — E118 Type 2 diabetes mellitus with unspecified complications: Secondary | ICD-10-CM

## 2014-02-16 DIAGNOSIS — L02419 Cutaneous abscess of limb, unspecified: Secondary | ICD-10-CM

## 2014-02-16 DIAGNOSIS — Z9010 Acquired absence of unspecified breast and nipple: Secondary | ICD-10-CM

## 2014-02-16 DIAGNOSIS — E1165 Type 2 diabetes mellitus with hyperglycemia: Secondary | ICD-10-CM | POA: Diagnosis present

## 2014-02-16 DIAGNOSIS — Z860100 Personal history of colon polyps, unspecified: Secondary | ICD-10-CM

## 2014-02-16 DIAGNOSIS — C50219 Malignant neoplasm of upper-inner quadrant of unspecified female breast: Secondary | ICD-10-CM | POA: Diagnosis present

## 2014-02-16 DIAGNOSIS — Z923 Personal history of irradiation: Secondary | ICD-10-CM

## 2014-02-16 DIAGNOSIS — Z833 Family history of diabetes mellitus: Secondary | ICD-10-CM

## 2014-02-16 DIAGNOSIS — IMO0002 Reserved for concepts with insufficient information to code with codable children: Principal | ICD-10-CM | POA: Diagnosis present

## 2014-02-16 DIAGNOSIS — M899 Disorder of bone, unspecified: Secondary | ICD-10-CM | POA: Diagnosis present

## 2014-02-16 DIAGNOSIS — E119 Type 2 diabetes mellitus without complications: Secondary | ICD-10-CM | POA: Diagnosis present

## 2014-02-16 DIAGNOSIS — G4733 Obstructive sleep apnea (adult) (pediatric): Secondary | ICD-10-CM | POA: Diagnosis present

## 2014-02-16 LAB — URINALYSIS
Bilirubin Urine: NEGATIVE
Glucose, UA: NEGATIVE mg/dL
Hgb urine dipstick: NEGATIVE
Ketones, ur: NEGATIVE mg/dL
Leukocytes, UA: NEGATIVE
Nitrite: NEGATIVE
Protein, ur: NEGATIVE mg/dL
Specific Gravity, Urine: 1.015 (ref 1.005–1.030)
Urobilinogen, UA: 0.2 mg/dL (ref 0.0–1.0)
pH: 5.5 (ref 5.0–8.0)

## 2014-02-16 LAB — PROTIME-INR
INR: 2.5 — AB (ref 0.00–1.49)
Prothrombin Time: 26.2 seconds — ABNORMAL HIGH (ref 11.6–15.2)

## 2014-02-16 LAB — BASIC METABOLIC PANEL
BUN: 17 mg/dL (ref 6–23)
CHLORIDE: 103 meq/L (ref 96–112)
CO2: 25 mEq/L (ref 19–32)
Calcium: 9.2 mg/dL (ref 8.4–10.5)
Creatinine, Ser: 0.72 mg/dL (ref 0.50–1.10)
GFR calc Af Amer: >90 mL/min (ref >90)
GFR calc non Af Amer: 83 mL/min — ABNORMAL LOW (ref >90)
GLUCOSE: 227 mg/dL — AB (ref 70–99)
POTASSIUM: 4.2 meq/L (ref 3.7–5.3)
Sodium: 139 mEq/L (ref 137–147)

## 2014-02-16 LAB — GLUCOSE, CAPILLARY
GLUCOSE-CAPILLARY: 149 mg/dL — AB (ref 70–99)
Glucose-Capillary: 176 mg/dL — ABNORMAL HIGH (ref 70–99)

## 2014-02-16 LAB — CBC
HEMATOCRIT: 32.7 % — AB (ref 36.0–46.0)
Hemoglobin: 10.7 g/dL — ABNORMAL LOW (ref 12.0–15.0)
MCH: 27 pg (ref 26.0–34.0)
MCHC: 32.7 g/dL (ref 30.0–36.0)
MCV: 82.6 fL (ref 78.0–100.0)
Platelets: 234 10*3/uL (ref 150–400)
RBC: 3.96 MIL/uL (ref 3.87–5.11)
RDW: 13.6 % (ref 11.5–15.5)
WBC: 7.1 10*3/uL (ref 4.0–10.5)

## 2014-02-16 MED ORDER — CIPROFLOXACIN IN D5W 400 MG/200ML IV SOLN
400.0000 mg | Freq: Two times a day (BID) | INTRAVENOUS | Status: DC
  Administered 2014-02-16 – 2014-02-19 (×6): 400 mg via INTRAVENOUS
  Filled 2014-02-16 (×6): qty 200

## 2014-02-16 MED ORDER — INSULIN ASPART 100 UNIT/ML ~~LOC~~ SOLN
0.0000 [IU] | Freq: Three times a day (TID) | SUBCUTANEOUS | Status: DC
  Administered 2014-02-16: 4 [IU] via SUBCUTANEOUS
  Administered 2014-02-17: 20 [IU] via SUBCUTANEOUS
  Administered 2014-02-17: 4 [IU] via SUBCUTANEOUS
  Administered 2014-02-17: 20 [IU] via SUBCUTANEOUS
  Administered 2014-02-18: 15 [IU] via SUBCUTANEOUS

## 2014-02-16 MED ORDER — MORPHINE SULFATE 2 MG/ML IJ SOLN
4.0000 mg | INTRAMUSCULAR | Status: DC | PRN
  Administered 2014-02-18 – 2014-02-20 (×6): 4 mg via INTRAVENOUS
  Filled 2014-02-16 (×6): qty 2

## 2014-02-16 MED ORDER — SODIUM CHLORIDE 0.9 % IV SOLN
INTRAVENOUS | Status: DC
  Administered 2014-02-16 – 2014-02-17 (×2): via INTRAVENOUS

## 2014-02-16 MED ORDER — ONDANSETRON HCL 4 MG/2ML IJ SOLN: 4.0000 mg | Freq: Four times a day (QID) | INTRAMUSCULAR | Status: DC | PRN

## 2014-02-16 MED ORDER — SULFAMETHOXAZOLE-TMP DS 800-160 MG PO TABS
1.0000 | ORAL_TABLET | Freq: Two times a day (BID) | ORAL | Status: DC
  Administered 2014-02-16 – 2014-02-17 (×3): 1 via ORAL
  Filled 2014-02-16 (×5): qty 1

## 2014-02-16 MED ORDER — HYDROCODONE-ACETAMINOPHEN 5-325 MG PO TABS
1.0000 | ORAL_TABLET | ORAL | Status: DC | PRN
  Filled 2014-02-16: qty 1

## 2014-02-16 NOTE — Telephone Encounter (Signed)
Left detailed message informing patient of what Dr. Charlett Blake said

## 2014-02-16 NOTE — Telephone Encounter (Signed)
Patient called back about this. She states that it is okay to leave a detailed message.

## 2014-02-16 NOTE — Telephone Encounter (Signed)
Please inform pt

## 2014-02-16 NOTE — H&P (Signed)
[name] is an 73 y.o. female.   Chief Complaint: Right breast cellulitis HPI: This is a patient who is status post right breast lumpectomy and radiation therapy for ductal carcinoma in situ. She Has had cellulitis  And  infection in the breast last several weeks.  She has been on several oral antibiotics without improvement. She has had needle aspiration of fluid in the axilla but no incision and drainage procedure. She is currently on Coumadin.  Past Medical History  Diagnosis Date  . Hyperplastic colon polyp   . Diabetes mellitus type II   . Osteoarthritis   . GERD (gastroesophageal reflux disease)   . Osteopenia   . Lupus     of skin see dermatologist frequently  . OA (osteoarthritis of spine)     C-spine  . DVT (deep venous thrombosis)     right leg DVT 05-17-2008  . Leg swelling     left leg 2-10, u/s showed a old clot, has a hypercoag panel,  . History of cardiovascular stress test     10/09 had CP, stress test neg, likely GI (GERD)-09/2006-had CP: stress test (-)  . 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 DVT (deep vein thrombosis) in pregnancy   . DVT of leg (deep venous thrombosis) 07/28/2011  . 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 Allergy   . Clotting disorder     hx of PE and DVT  . C. difficile diarrhea     hx of,   . Skin cancer     skin CA  . Left leg pain 03/12/2013  . Otitis externa 03/19/2013    right  . PONV (postoperative nausea and vomiting)   . Hypertension     pcp  dr Conley Canal   h.p   lebaur  . 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 Cholecystectomy    . Breast biopsy      left breast and right breast  . Breast lumpectomy      left breast-ductal carcinoma in situ  . Appendectomy    . Skin cancer excision      from back   . Breast lumpectomy with needle localization and axillary  sentinel lymph node bx Right 06/17/2013    Procedure: BREAST LUMPECTOMY WITH NEEDLE LOCALIZATION AND AXILLARY SENTINEL LYMPH NODE BX;  Surgeon: Merrie Roof, MD;  Location: [facility];  Service: General;  Laterality: Right;    Family History  Problem Relation Age of Onset  . Diabetes Mother   . Diabetes    . Heart attack Neg Hx   . Colon cancer Neg Hx   . Breast cancer Neg Hx   . Esophageal cancer Neg Hx   . Rectal cancer Neg Hx   . Stomach cancer Neg Hx   . Cancer Father     melanoma  . Diabetes Brother   . Other Brother     amputation   Social History:  reports that she has never smoked. She has never used smokeless tobacco. She reports that she does not drink alcohol or use illicit drugs.  Allergies:  Allergies  Allergen Reactions  . Metformin Diarrhea  . Pioglitazone Other (See Comments)    REACTION: wt gain  . Vancomycin     Erythematous rash, hands and toes became cyanotic.       (  Not in a hospital admission)  Results for orders placed in visit on 02/14/14 (from the past 48 hour(s))  URINALYSIS     Status: None   Collection Time    02/14/14  7:11 PM      Result Value Ref Range   Color, Urine YELLOW  YELLOW   APPearance CLEAR  CLEAR   Specific Gravity, Urine 1.015  1.005 - 1.030   pH 5.5  5.0 - 8.0   Glucose, UA NEG  NEG mg/dL   Bilirubin Urine NEG  NEG   Ketones, ur NEG  NEG mg/dL   Hgb urine dipstick NEG  NEG   Protein, ur NEG  NEG mg/dL   Urobilinogen, UA 0.2  0.0 - 1.0 mg/dL   Nitrite NEG  NEG   Leukocytes, UA NEG  NEG   No results found.  Review of Systems  All other systems reviewed and are negative.    Blood pressure 126/82, pulse 72, temperature 97.5 F (36.4 C), temperature source Oral, resp. rate 16, height 5\' 6"  (1.676 m), weight 200 lb (90.719 kg). Physical Exam  Constitutional: She is oriented to person, place, and time. She appears well-developed and well-nourished. She appears distressed.  Eyes: Conjunctivae are normal. Pupils are equal,  round, and reactive to light.  Neck: Normal range of motion. Neck supple.  Cardiovascular: Normal rate, regular rhythm, normal heart sounds and intact distal pulses.   Respiratory: Effort normal and breath sounds normal. No respiratory distress. She has no wheezes.  GI: Soft. Bowel sounds are normal.  Musculoskeletal: Normal range of motion.  Neurological: She is alert and oriented to person, place, and time.   Breasts: There is erythema of the right breast with tenderness. There is an incision in the axilla with what appeared to be a developing abscess.  Assessment/Plan Right breast cellulitis with a developing abscess  She has failed outpatient conservative management. I believe she needs admission to the hospital for IV antibiotics.  We'll make the arrangements and check her INR at admission. She will be admitted to our surgical hospitalist service At [facility] A 02/16/2014, 3:34 PM

## 2014-02-16 NOTE — Telephone Encounter (Signed)
Please advise? Was patient to take this again?

## 2014-02-16 NOTE — Telephone Encounter (Signed)
PCP's office calling to report pt has been on antibiotics, but will only need refill at our surgeon's discretion.  Dr. Randel Pigg wanted this documented.

## 2014-02-16 NOTE — Progress Notes (Signed)
Subjective:     Patient ID: [name], female   DOB: June 25, 1941, 73 y.o.   MRN: [id number]  HPI She is here for persistent erythema of the right breast as well as drainage in the axilla.  Review of Systems     Objective:   Physical Exam On exam, there still significant erythema of the breast and what appears to be an abscess developing in the axilla    Assessment:     Right breast Cellulitis and abscess     Plan:     Because of her Coumadin and her failure on oral antibiotics, I believe she needs admission to the hospital for IV antibiotics and incision and drainage in the operating room when her INR has decreased. I'll notify our surgeons on call at [facility]

## 2014-02-16 NOTE — Telephone Encounter (Signed)
Look at previous notes

## 2014-02-16 NOTE — Telephone Encounter (Signed)
She is out of doxycyclin and she has 14 clindamycin left.  Should she get more today?

## 2014-02-16 NOTE — Telephone Encounter (Signed)
She is seeing surgeon today so she does not need refills on doxy or clindamycin unless they says she needs to stay on it.

## 2014-02-17 LAB — GLUCOSE, CAPILLARY
GLUCOSE-CAPILLARY: 387 mg/dL — AB (ref 70–99)
GLUCOSE-CAPILLARY: 374 mg/dL — AB (ref 70–99)
Glucose-Capillary: 190 mg/dL — ABNORMAL HIGH (ref 70–99)
Glucose-Capillary: 460 mg/dL — ABNORMAL HIGH (ref 70–99)

## 2014-02-17 LAB — PROTIME-INR
INR: 2.38 — ABNORMAL HIGH (ref 0.00–1.49)
Prothrombin Time: 25.2 seconds — ABNORMAL HIGH (ref 11.6–15.2)

## 2014-02-17 LAB — URINE CULTURE
COLONY COUNT: NO GROWTH
ORGANISM ID, BACTERIA: NO GROWTH

## 2014-02-17 MED ORDER — INSULIN GLARGINE 100 UNIT/ML ~~LOC~~ SOLN
20.0000 [IU] | Freq: Every day | SUBCUTANEOUS | Status: DC
  Administered 2014-02-17: 20 [IU] via SUBCUTANEOUS
  Filled 2014-02-17: qty 0.2

## 2014-02-17 MED ORDER — INSULIN ASPART 100 UNIT/ML ~~LOC~~ SOLN
15.0000 [IU] | Freq: Once | SUBCUTANEOUS | Status: AC
  Administered 2014-02-17: 15 [IU] via SUBCUTANEOUS

## 2014-02-17 NOTE — Progress Notes (Signed)
Inpatient Diabetes Program Recommendations  AACE/ADA: New Consensus Statement on Inpatient Glycemic Control (2013)  Target Ranges:  Prepandial:   less than 140 mg/dL      Peak postprandial:   less than 180 mg/dL (1-2 hours)      Critically ill patients:  140 - 180 mg/dL   Reason for Visit: Hyperglycemia  Diabetes history: DM2 Outpatient Diabetes medications: Lantus 40 units QHS and Regular s/s Current orders for Inpatient glycemic control: Novolog resistant tidwc  NPO for surgery. Did not receive any basal insulin last night. Will need at least 1/2 home dose Lantus.  Inpatient Diabetes Program Recommendations Insulin - Basal: Please consider addition of 1/2 home dose of Lantus while NPO - Lantus 20 units QHS Correction (SSI): Change Novolog to resistant Q4H while NPO, then tidwc and hs HgbA1C: Need updated HgbA1C to assess glycemic control prior to hospitalization Diet: When advanced CHO mod med  Note: Will continue to follow. Thank you. Lorenda Peck, RD, LDN, CDE Inpatient Diabetes Coordinator [phone]

## 2014-02-17 NOTE — Progress Notes (Signed)
Utilization review completed.  

## 2014-02-17 NOTE — Anesthesia Preprocedure Evaluation (Addendum)
Anesthesia Evaluation  Patient identified by MRN, date of birth, ID band Patient awake    Reviewed: Allergy & Precautions, H&P , NPO status , Patient's Chart, lab work & pertinent test results  History of Anesthesia Complications (+) PONV  Airway Mallampati: III TM Distance: >3 FB Neck ROM: full    Dental  (+) Edentulous Upper, Dental Advisory Given   Pulmonary sleep apnea ,  Hx. PE 9/12 breath sounds clear to auscultation  Pulmonary exam normal       Cardiovascular hypertension, Pt. on medications Rhythm:regular Rate:Normal     Neuro/Psych negative neurological ROS  negative psych ROS   GI/Hepatic negative GI ROS, Neg liver ROS, GERD-  Medicated and Controlled,  Endo/Other  diabetes, Well Controlled, Type 2, Insulin Dependent  Renal/GU negative Renal ROS  negative genitourinary   Musculoskeletal   Abdominal   Peds  Hematology negative hematology ROS (+) Blood dyscrasia, anemia , Jehovah's Witness. Hgb 10.7   Anesthesia Other Findings   Reproductive/Obstetrics negative OB ROS                          Anesthesia Physical Anesthesia Plan  ASA: III  Anesthesia Plan: General   Post-op Pain Management:    Induction: Intravenous  Airway Management Planned: LMA  Additional Equipment:   Intra-op Plan:   Post-operative Plan:   Informed Consent: I have reviewed the patients History and Physical, chart, labs and discussed the procedure including the risks, benefits and alternatives for the proposed anesthesia with the patient or authorized representative who has indicated his/her understanding and acceptance.   Dental Advisory Given  Plan Discussed with: CRNA and Surgeon  Anesthesia Plan Comments:         Anesthesia Quick Evaluation

## 2014-02-18 ENCOUNTER — Encounter (HOSPITAL_COMMUNITY): Payer: Self-pay | Admitting: Anesthesiology

## 2014-02-18 ENCOUNTER — Encounter (HOSPITAL_COMMUNITY): Admission: AD | Disposition: A | Discharge status: Home / Self Care | Payer: Self-pay | Source: Ambulatory Visit

## 2014-02-18 ENCOUNTER — Encounter (HOSPITAL_COMMUNITY): Payer: Medicare Other | Admitting: Anesthesiology

## 2014-02-18 ENCOUNTER — Inpatient Hospital Stay (HOSPITAL_COMMUNITY): Payer: Medicare Other | Admitting: Anesthesiology

## 2014-02-18 DIAGNOSIS — IMO0002 Reserved for concepts with insufficient information to code with codable children: Secondary | ICD-10-CM

## 2014-02-18 DIAGNOSIS — L02419 Cutaneous abscess of limb, unspecified: Secondary | ICD-10-CM

## 2014-02-18 HISTORY — PX: IRRIGATION AND DEBRIDEMENT ABSCESS: SHX5252

## 2014-02-18 LAB — GLUCOSE, CAPILLARY
GLUCOSE-CAPILLARY: 212 mg/dL — AB (ref 70–99)
GLUCOSE-CAPILLARY: 402 mg/dL — AB (ref 70–99)
Glucose-Capillary: 307 mg/dL — ABNORMAL HIGH (ref 70–99)
Glucose-Capillary: 230 mg/dL — ABNORMAL HIGH (ref 70–99)
Glucose-Capillary: 185 mg/dL — ABNORMAL HIGH (ref 70–99)
Glucose-Capillary: 229 mg/dL — ABNORMAL HIGH (ref 70–99)
Glucose-Capillary: 300 mg/dL — ABNORMAL HIGH (ref 70–99)

## 2014-02-18 LAB — SURGICAL PCR SCREEN
MRSA, PCR: NEGATIVE
Staphylococcus aureus: NEGATIVE

## 2014-02-18 LAB — PROTIME-INR
INR: 1.86 — ABNORMAL HIGH (ref 0.00–1.49)
PROTHROMBIN TIME: 20.9 s — AB (ref 11.6–15.2)

## 2014-02-18 SURGERY — IRRIGATION AND DEBRIDEMENT ABSCESS
Anesthesia: General | Site: Axilla | Laterality: Right

## 2014-02-18 MED ORDER — LACTATED RINGERS IV SOLN
INTRAVENOUS | Status: DC | PRN
  Administered 2014-02-18: 10:00:00 via INTRAVENOUS

## 2014-02-18 MED ORDER — FENTANYL CITRATE 0.05 MG/ML IJ SOLN
INTRAMUSCULAR | Status: AC
  Filled 2014-02-18: qty 2

## 2014-02-18 MED ORDER — FENTANYL CITRATE 0.05 MG/ML IJ SOLN
25.0000 ug | INTRAMUSCULAR | Status: DC | PRN
  Administered 2014-02-18 (×2): 50 ug via INTRAVENOUS

## 2014-02-18 MED ORDER — INSULIN ASPART 100 UNIT/ML ~~LOC~~ SOLN
25.0000 [IU] | Freq: Once | SUBCUTANEOUS | Status: AC
  Administered 2014-02-18: 25 [IU] via SUBCUTANEOUS

## 2014-02-18 MED ORDER — PROMETHAZINE HCL 25 MG/ML IJ SOLN
INTRAMUSCULAR | Status: AC
  Filled 2014-02-18: qty 1

## 2014-02-18 MED ORDER — PROPOFOL 10 MG/ML IV BOLUS
INTRAVENOUS | Status: AC
  Filled 2014-02-18: qty 20

## 2014-02-18 MED ORDER — LIDOCAINE HCL (CARDIAC) 20 MG/ML IV SOLN
INTRAVENOUS | Status: DC | PRN
  Administered 2014-02-18: 100 mg via INTRAVENOUS

## 2014-02-18 MED ORDER — SODIUM CHLORIDE 0.9 % IV SOLN
INTRAVENOUS | Status: DC
  Administered 2014-02-18: 20 mL/h via INTRAVENOUS
  Administered 2014-02-18: 22:00:00 via INTRAVENOUS

## 2014-02-18 MED ORDER — INSULIN ASPART 100 UNIT/ML ~~LOC~~ SOLN
0.0000 [IU] | Freq: Three times a day (TID) | SUBCUTANEOUS | Status: DC
  Administered 2014-02-18: 11 [IU] via SUBCUTANEOUS
  Administered 2014-02-18: 7 [IU] via SUBCUTANEOUS
  Administered 2014-02-19: 20 [IU] via SUBCUTANEOUS
  Administered 2014-02-19: 4 [IU] via SUBCUTANEOUS
  Administered 2014-02-19: 7 [IU] via SUBCUTANEOUS
  Administered 2014-02-20: 15 [IU] via SUBCUTANEOUS

## 2014-02-18 MED ORDER — 0.9 % SODIUM CHLORIDE (POUR BTL) OPTIME
TOPICAL | Status: DC | PRN
  Administered 2014-02-18: 1000 mL

## 2014-02-18 MED ORDER — CEFAZOLIN SODIUM-DEXTROSE 2-3 GM-% IV SOLR
2.0000 g | Freq: Three times a day (TID) | INTRAVENOUS | Status: DC
  Administered 2014-02-18 – 2014-02-20 (×6): 2 g via INTRAVENOUS
  Filled 2014-02-18 (×9): qty 50

## 2014-02-18 MED ORDER — METOCLOPRAMIDE HCL 5 MG/ML IJ SOLN
INTRAMUSCULAR | Status: DC | PRN
  Administered 2014-02-18: 10 mg via INTRAVENOUS

## 2014-02-18 MED ORDER — ONDANSETRON HCL 4 MG/2ML IJ SOLN
INTRAMUSCULAR | Status: AC
  Filled 2014-02-18: qty 2

## 2014-02-18 MED ORDER — WARFARIN SODIUM 2.5 MG PO TABS
2.5000 mg | ORAL_TABLET | Freq: Once | ORAL | Status: AC
  Administered 2014-02-18: 2.5 mg via ORAL
  Filled 2014-02-18 (×2): qty 1

## 2014-02-18 MED ORDER — PROMETHAZINE HCL 25 MG/ML IJ SOLN
6.2500 mg | INTRAMUSCULAR | Status: DC | PRN
  Administered 2014-02-18: 6.25 mg via INTRAVENOUS

## 2014-02-18 MED ORDER — ONDANSETRON HCL 4 MG/2ML IJ SOLN
INTRAMUSCULAR | Status: DC | PRN
  Administered 2014-02-18: 4 mg via INTRAVENOUS

## 2014-02-18 MED ORDER — LACTATED RINGERS IV SOLN: INTRAVENOUS | Status: DC

## 2014-02-18 MED ORDER — LIDOCAINE HCL (CARDIAC) 20 MG/ML IV SOLN
INTRAVENOUS | Status: AC
  Filled 2014-02-18: qty 5

## 2014-02-18 MED ORDER — INSULIN GLARGINE 100 UNIT/ML ~~LOC~~ SOLN
40.0000 [IU] | Freq: Every day | SUBCUTANEOUS | Status: DC
  Administered 2014-02-18 – 2014-02-19 (×2): 40 [IU] via SUBCUTANEOUS
  Filled 2014-02-18 (×3): qty 0.4

## 2014-02-18 MED ORDER — INSULIN ASPART 100 UNIT/ML ~~LOC~~ SOLN
0.0000 [IU] | Freq: Every day | SUBCUTANEOUS | Status: DC
  Administered 2014-02-19: 5 [IU] via SUBCUTANEOUS

## 2014-02-18 MED ORDER — WARFARIN - PHARMACIST DOSING INPATIENT: Freq: Every day | Status: DC

## 2014-02-18 MED ORDER — FENTANYL CITRATE 0.05 MG/ML IJ SOLN
INTRAMUSCULAR | Status: DC | PRN
  Administered 2014-02-18 (×2): 50 ug via INTRAVENOUS

## 2014-02-18 MED ORDER — PROPOFOL 10 MG/ML IV BOLUS
INTRAVENOUS | Status: DC | PRN
  Administered 2014-02-18: 200 mg via INTRAVENOUS

## 2014-02-18 SURGICAL SUPPLY — 30 items
ADH SKN CLS APL DERMABOND .7 (GAUZE/BANDAGES/DRESSINGS)
BLADE HEX COATED 2.75 (ELECTRODE) ×3 IMPLANT
BLADE SURG SZ10 CARB STEEL (BLADE) ×3 IMPLANT
CANISTER SUCTION 2500CC (MISCELLANEOUS) ×3 IMPLANT
DECANTER SPIKE VIAL GLASS SM (MISCELLANEOUS) IMPLANT
DERMABOND ADVANCED (GAUZE/BANDAGES/DRESSINGS)
DERMABOND ADVANCED .7 DNX12 (GAUZE/BANDAGES/DRESSINGS) IMPLANT
DRAPE LAPAROTOMY TRNSV 102X78 (DRAPE) IMPLANT
ELECT REM PT RETURN 9FT ADLT (ELECTROSURGICAL) ×3
ELECTRODE REM PT RTRN 9FT ADLT (ELECTROSURGICAL) ×1 IMPLANT
GAUZE PACKING IODOFORM 1 (PACKING) ×4 IMPLANT
GLOVE BIOGEL PI IND STRL 7.0 (GLOVE) ×1 IMPLANT
GLOVE BIOGEL PI INDICATOR 7.0 (GLOVE) ×2
GOWN STRL REUS W/TWL LRG LVL3 (GOWN DISPOSABLE) ×3 IMPLANT
KIT BASIN OR (CUSTOM PROCEDURE TRAY) ×3 IMPLANT
NDL HYPO 25X1 1.5 SAFETY (NEEDLE) ×1 IMPLANT
NEEDLE HYPO 25X1 1.5 SAFETY (NEEDLE) ×3 IMPLANT
NS IRRIG 1000ML POUR BTL (IV SOLUTION) ×3 IMPLANT
PACK BASIC VI WITH GOWN DISP (CUSTOM PROCEDURE TRAY) ×3 IMPLANT
PAD ABD 8X10 STRL (GAUZE/BANDAGES/DRESSINGS) ×2 IMPLANT
PENCIL BUTTON HOLSTER BLD 10FT (ELECTRODE) ×3 IMPLANT
SPONGE GAUZE 4X4 12PLY (GAUZE/BANDAGES/DRESSINGS) ×3 IMPLANT
SPONGE LAP 18X18 X RAY DECT (DISPOSABLE) ×2 IMPLANT
STAPLER VISISTAT 35W (STAPLE) IMPLANT
SUT MNCRL AB 4-0 PS2 18 (SUTURE) IMPLANT
SUT VIC AB 3-0 SH 18 (SUTURE) IMPLANT
SYR CONTROL 10ML LL (SYRINGE) ×3 IMPLANT
TAPE CLOTH SURG 6X10 WHT LF (GAUZE/BANDAGES/DRESSINGS) ×2 IMPLANT
TOWEL OR 17X26 10 PK STRL BLUE (TOWEL DISPOSABLE) ×3 IMPLANT
YANKAUER SUCT BULB TIP 10FT TU (MISCELLANEOUS) ×3 IMPLANT

## 2014-02-18 NOTE — Progress Notes (Signed)
Pt is Jehovah's Witness NO BLOOD!!!!

## 2014-02-18 NOTE — Op Note (Signed)
02/16/2014 - 02/18/2014  10:46 AM  PATIENT:  [name]  73 y.o. female  Patient Care Team: Debbrah Alar, NP as PCP - General (Internal Medicine) Vernie Ammons as Consulting Physician (Dermatology) Lafayette Dragon, MD (Gastroenterology) Amalia Greenhouse, MD as Referring Physician (Endocrinology) [place] Crane, MD as Referring Physician (Rheumatology) Merrie Roof, MD as Consulting Physician (General Surgery) Deatra Robinson, MD as Consulting Physician (Oncology) Lora Paula, MD as Consulting Physician (Radiation Oncology)  PRE-OPERATIVE DIAGNOSIS:  right axillary abcess  POST-OPERATIVE DIAGNOSIS:  right axillary abcess  PROCEDURE:  INCISION AND DRAINAGE OF RIGHT AXILLARY ABSCESS  SURGEON:  Surgeon(s): Leighton Ruff, MD  ASSISTANT: none   ANESTHESIA:   general  EBL:  Total I/O In: 0  Out: 300 [Urine:300]  DRAINS: none   SPECIMEN:  Source of Specimen:  axillary abscess  DISPOSITION OF SPECIMEN:  Micro  COUNTS:  YES  PLAN OF CARE: patient already admitted  PATIENT DISPOSITION:  PACU - hemodynamically stable.  INDICATION: This is a 73 year old who is status post a lumpectomy and lymph node dissection approximately 6 months ago. This was done for DCIS. She is status post radiation treatment. For the past several months she has had erythema of the right breast and right axilla. She has been on multiple antibiotics which has resolved with cellulitis of the right breast but is not resolve the cellulitis of the right axilla. It was decided to proceed to the operating room today for evaluation of her wound and possible I&D.   OR FINDINGS: 2 x 3 cm abscess cavity in the right axilla. Cultures sent to microbiology for evaluation.  DESCRIPTION: the patient was identified in the preoperative holding area and taken to the OR where they were laid Supine on the operating room table.  Gen. anesthesia was induced without difficulty. SCDs were also noted to be in place  prior to the initiation of anesthesia.  The patient was then prepped and draped in the usual sterile fashion.    A surgical timeout was performed indicating the correct patient, procedure, positioning and need for preoperative antibiotics.   I began by making an incision with Bovie electrocautery around the previous Axillary incision site.  Cultures were obtained for microbiology. A small amount of purulence was removed. I then used a hemostat to probe into the wound. This easily passed into a cavity just underneath which was approximately 2-3 cm in depth and 1-2 cm in width. This was irrigated. Hemostasis was then achieved using Bovie electrocautery and direct pressure. 1 inch iodoform gauze was then packed into the cavity and a sterile dressing was applied. The patient was awakened from anesthesia and sent to the postanesthesia care area in stable condition. All counts were correct per operating room staff.

## 2014-02-18 NOTE — Transfer of Care (Signed)
Immediate Anesthesia Transfer of Care Note  Patient: [name]  Procedure(s) Performed: Procedure(s): IRRIGATION AND DEBRIDEMENT RIGHT AXILLARY ABSCESS (Right)  Patient Location: PACU  Anesthesia Type:General  Level of Consciousness: awake and oriented  Airway & Oxygen Therapy: Patient Spontanous Breathing and Patient connected to face mask oxygen  Post-op Assessment: Report given to PACU RN and Post -op Vital signs reviewed and stable  Post vital signs: Reviewed and stable  Complications: No apparent anesthesia complications

## 2014-02-18 NOTE — Progress Notes (Signed)
Pt's cbg 402. md notified. Orders given. MD ordered to not draw stat lab verification.  Rosie Fate

## 2014-02-18 NOTE — Progress Notes (Addendum)
ANTICOAGULATION CONSULT NOTE - Initial Consult  Pharmacy Consult for warfarin Indication: pulmonary embolus and DVT  Allergies  Allergen Reactions  . Metformin Diarrhea  . Pioglitazone Other (See Comments)    REACTION: wt gain  . Vancomycin     Erythematous rash, hands and toes became cyanotic.      Patient Measurements: Height: 5' 6.14" (168 cm) Weight: 199 lb 15.3 oz (90.7 kg) IBW/kg (Calculated) : 59.63  Vital Signs: Temp: 97.8 F (36.6 C) (04/04 1049) Temp src: Oral (04/04 0621) BP: 172/75 mmHg (04/04 1049) Pulse Rate: 90 (04/04 1049)  Labs:  Recent Labs  02/16/14 1646 02/16/14 1829 02/17/14 0445 02/18/14 0544  HGB 10.7*  --   --   --   HCT 32.7*  --   --   --   PLT 234  --   --   --   LABPROT  --  26.2* 25.2* 20.9*  INR  --  2.50* 2.38* 1.86*  CREATININE 0.72  --   --   --     Estimated Creatinine Clearance: 71.2 ml/min (by C-G formula based on Cr of 0.72).   Medical History: Past Medical History  Diagnosis Date  . Hyperplastic colon polyp   . Diabetes mellitus type II   . Osteoarthritis   . GERD (gastroesophageal reflux disease)   . Osteopenia   . Lupus     of skin see dermatologist frequently  . OA (osteoarthritis of spine)     C-spine  . DVT (deep venous thrombosis)     right leg DVT 05-17-2008  . Leg swelling     left leg 2-10, u/s showed a old clot, has a hypercoag panel,  . History of cardiovascular stress test     10/09 had CP, stress test neg, likely GI (GERD)-09/2006-had CP: stress test (-)  . 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 DVT (deep vein thrombosis) in pregnancy   . DVT of leg (deep venous thrombosis) 07/28/2011  . 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 Allergy   . Clotting disorder     hx of PE and DVT  . C. difficile diarrhea     hx of,   . Skin cancer     skin CA  . Left leg pain 03/12/2013  . Otitis externa 03/19/2013    right  . PONV (postoperative nausea and vomiting)   . Hypertension     pcp  dr Conley Canal   h.p    lebaur  . Breast cancer     left ductal breast ca dx 2003 approx, s/p XRt x 6 weeks, released from oncology (per  patient); dx right breast ca 2014    Medications:  Scheduled:  . [facility] HOLD] ciprofloxacin  400 mg Intravenous Q12H  . fentaNYL      . [MAR HOLD] insulin aspart  0-20 Units Subcutaneous TID WC  . [MAR HOLD] insulin glargine  20 Units Subcutaneous QHS  . promethazine      . [facility] HOLD] sulfamethoxazole-trimethoprim  1 tablet Oral Q12H   Infusions:  . sodium chloride 50 mL/hr at 02/17/14 1257  . lactated ringers      Assessment: 73 yo female with bilateral breast cancer s/p right breast lumpectomy and XRT admitted with ongoing right breast cellulitis and abscess and failed outpatient oral abx on 4/2. Patient was on warfarin PTA for hx DVT/PE at dose of 5mg  daily except 2.5mg  on Thurs/Fri. Noted patient was also on Diflucan PTA and warfarin dose was reduced with INR  follow up to be on 4/7. Upon admission, warfarin was placed on hold until INR < 2 so could go to OR. Incision and drainage of abscess performed today 4/4 and to restart warfarin post op  INR 1.86, subtherapeutic this AM despite no doses of warfarin last 2 days  Patient currently on antibiotics PO Bactrim and IV Cipro, Bactrim which will interact with warfarin and Cipro which may interact with warfarin causing an increase in INR  Goal of Therapy:  INR 2-3   Plan:  1) Restart warfarin at dose of 2.5mg  today, DUE to concurrent Bactrim and Cipro 2) Daily INR 3) Based on 3/27 culture of MSSA, recommend switching Bactrim/Cipro to Ancef or Rocephin alone.  Adrian Saran, PharmD, BCPS Pager [phone] 02/18/2014 11:25 AM

## 2014-02-18 NOTE — Anesthesia Postprocedure Evaluation (Signed)
  Anesthesia Post-op Note  Patient: [name]  Procedure(s) Performed: Procedure(s) (LRB): IRRIGATION AND DEBRIDEMENT RIGHT AXILLARY ABSCESS (Right)  Patient Location: PACU  Anesthesia Type: General  Level of Consciousness: awake and alert   Airway and Oxygen Therapy: Patient Spontanous Breathing  Post-op Pain: mild  Post-op Assessment: Post-op Vital signs reviewed, Patient's Cardiovascular Status Stable, Respiratory Function Stable, Patent Airway and No signs of Nausea or vomiting  Last Vitals:  Filed Vitals:   02/18/14 1049  BP: 172/75  Pulse: 90  Temp: 36.6 C  Resp: 16    Post-op Vital Signs: stable   Complications: No apparent anesthesia complications

## 2014-02-18 NOTE — Progress Notes (Signed)
This is a late entry note for 02/17/2014. I saw the patient in mid morning. She was sitting in bed in no apparent distress. Her vitals were stable. On physical exam she had a area of cellulitis in her right axilla with a pustule noted over the previous axillary incision site. Her INR was 2.4 at this time.  We elected to allow her INR to drift down over the next 24 hours before performing an I&D.  She was in agreement with this. A regular diet was started. Insulin coverage was obtained for diabetes.  She was made n.p.o. After midnight. All questions were answered.

## 2014-02-18 NOTE — Progress Notes (Signed)
<  principal problem not specified>  Subjective: Pt feels about the same.    Objective: Vital signs in last 24 hours: Temp:  [98 F (36.7 C)-98.4 F (36.9 C)] 98.3 F (36.8 C) (04/04 0621) Pulse Rate:  [65-77] 65 (04/04 0621) Resp:  [18] 18 (04/04 0621) BP: (147-158)/(56-73) 155/68 mmHg (04/04 0621) SpO2:  [97 %-98 %] 97 % (04/04 0621) Last BM Date: 02/16/14  Intake/Output from previous day: 04/03 0701 - 04/04 0700 In: 1960.4 [P.O.:380; I.V.:1180.4; IV Piggyback:400] Out: 2300 [Urine:2300] Intake/Output this shift: Total I/O In: 0  Out: 300 [Urine:300]  General appearance: alert and cooperative Skin: Skin color, texture, turgor normal. No rashes or lesions Incision/Wound: erythema around R axilla about the same as yesterday  Lab Results:  Results for orders placed during the hospital encounter of 02/16/14 (from the past 24 hour(s))  GLUCOSE, CAPILLARY     Status: Abnormal   Collection Time    02/17/14 11:52 AM      Result Value Ref Range   Glucose-Capillary 190 (*) 70 - 99 mg/dL  GLUCOSE, CAPILLARY     Status: Abnormal   Collection Time    02/17/14  4:31 PM      Result Value Ref Range   Glucose-Capillary 374 (*) 70 - 99 mg/dL  GLUCOSE, CAPILLARY     Status: Abnormal   Collection Time    02/17/14  9:12 PM      Result Value Ref Range   Glucose-Capillary 460 (*) 70 - 99 mg/dL   Comment 1 Notify RN    SURGICAL PCR SCREEN     Status: None   Collection Time    02/18/14  5:33 AM      Result Value Ref Range   MRSA, PCR NEGATIVE  NEGATIVE   Staphylococcus aureus NEGATIVE  NEGATIVE  PROTIME-INR     Status: Abnormal   Collection Time    02/18/14  5:44 AM      Result Value Ref Range   Prothrombin Time 20.9 (*) 11.6 - 15.2 seconds   INR 1.86 (*) 0.00 - 1.49  GLUCOSE, CAPILLARY     Status: Abnormal   Collection Time    02/18/14  7:15 AM      Result Value Ref Range   Glucose-Capillary 307 (*) 70 - 99 mg/dL     Studies/Results Radiology     MEDS, Scheduled .  [facility] HOLD] ciprofloxacin  400 mg Intravenous Q12H  . [MAR HOLD] insulin aspart  0-20 Units Subcutaneous TID WC  . [MAR HOLD] insulin glargine  20 Units Subcutaneous QHS  . [facility] HOLD] sulfamethoxazole-trimethoprim  1 tablet Oral Q12H     Assessment: <principal problem not specified> R axillary abscess  Plan: OR today for I&D INR <2 today  LOS: 2 days    Rosario Adie, MD [facility], [place] [phone]   02/18/2014 10:02 AM

## 2014-02-19 ENCOUNTER — Encounter: Payer: Self-pay | Admitting: Radiation Oncology

## 2014-02-19 LAB — PROTIME-INR
INR: 1.59 — ABNORMAL HIGH (ref 0.00–1.49)
Prothrombin Time: 18.5 seconds — ABNORMAL HIGH (ref 11.6–15.2)

## 2014-02-19 LAB — GLUCOSE, CAPILLARY
Glucose-Capillary: 191 mg/dL — ABNORMAL HIGH (ref 70–99)
Glucose-Capillary: 390 mg/dL — ABNORMAL HIGH (ref 70–99)
Glucose-Capillary: 378 mg/dL — ABNORMAL HIGH (ref 70–99)
Glucose-Capillary: 396 mg/dL — ABNORMAL HIGH (ref 70–99)

## 2014-02-19 MED ORDER — WARFARIN SODIUM 5 MG PO TABS
5.0000 mg | ORAL_TABLET | Freq: Once | ORAL | Status: AC
  Administered 2014-02-19: 5 mg via ORAL
  Filled 2014-02-19: qty 1

## 2014-02-19 NOTE — Progress Notes (Signed)
ANTICOAGULATION CONSULT NOTE - Follow Up  Pharmacy Consult for warfarin Indication: pulmonary embolus and DVT  Allergies  Allergen Reactions  . Metformin Diarrhea  . Pioglitazone Other (See Comments)    REACTION: wt gain  . Vancomycin     Erythematous rash, hands and toes became cyanotic.      Patient Measurements: Height: 5' 6.14" (168 cm) Weight: 199 lb 15.3 oz (90.7 kg) IBW/kg (Calculated) : 59.63  Vital Signs: Temp: 98.5 F (36.9 C) (04/05 0440) Temp src: Oral (04/05 0440) BP: 155/72 mmHg (04/05 0440) Pulse Rate: 71 (04/05 0440)  Labs:  Recent Labs  02/16/14 1646  02/17/14 0445 02/18/14 0544 02/19/14 0438  HGB 10.7*  --   --   --   --   HCT 32.7*  --   --   --   --   PLT 234  --   --   --   --   LABPROT  --   < > 25.2* 20.9* 18.5*  INR  --   < > 2.38* 1.86* 1.59*  CREATININE 0.72  --   --   --   --   < > = values in this interval not displayed.  Estimated Creatinine Clearance: 71.2 ml/min (by C-G formula based on Cr of 0.72).   Medical History: Past Medical History  Diagnosis Date  . Hyperplastic colon polyp   . Diabetes mellitus type II   . Osteoarthritis   . GERD (gastroesophageal reflux disease)   . Osteopenia   . Lupus     of skin see dermatologist frequently  . OA (osteoarthritis of spine)     C-spine  . DVT (deep venous thrombosis)     right leg DVT 05-17-2008  . Leg swelling     left leg 2-10, u/s showed a old clot, has a hypercoag panel,  . History of cardiovascular stress test     10/09 had CP, stress test neg, likely GI (GERD)-09/2006-had CP: stress test (-)  . 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 DVT (deep vein thrombosis) in pregnancy   . DVT of leg (deep venous thrombosis) 07/28/2011  . 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 Allergy   . Clotting disorder     hx of PE and DVT  . C. difficile diarrhea     hx of,   . Skin cancer     skin CA  . Left leg pain 03/12/2013  . Otitis externa 03/19/2013    right  . PONV  (postoperative nausea and vomiting)   . Hypertension     pcp  dr Conley Canal   h.p   lebaur  . Breast cancer     left ductal breast ca dx 2003 approx, s/p XRt x 6 weeks, released from oncology (per  patient); dx right breast ca 2014    Medications:  Scheduled:  .  ceFAZolin (ANCEF) IV  2 g Intravenous 3 times per day  . ciprofloxacin  400 mg Intravenous Q12H  . insulin aspart  0-20 Units Subcutaneous TID WC  . insulin aspart  0-5 Units Subcutaneous QHS  . insulin glargine  40 Units Subcutaneous QHS  . Warfarin - Pharmacist Dosing Inpatient   Does not apply q1800   Infusions:  . sodium chloride 20 mL/hr at 02/18/14 2227    Assessment: 73 yo female with bilateral breast cancer s/p right breast lumpectomy and XRT admitted with ongoing right breast cellulitis and abscess and failed outpatient oral abx on 4/2. Patient was on warfarin  PTA for hx DVT/PE at dose of 5mg  daily except 2.5mg  on Thurs/Fri. Noted patient was also on Diflucan PTA and warfarin dose was reduced with INR follow up to be on 4/7. Upon admission, warfarin was placed on hold until INR < 2 so could go to OR. Incision and drainage of abscess performed today 4/4 and to restart warfarin post op  INR subtherapeutic this AM and dropped from yesterday  Warfarin doses: 2.5mg  (4/4)  No reported bleeding  Note that Septra was changed to Ancef based on cultures and Cipro continued  Goal of Therapy:  INR 2-3   Plan:  1) 5mg  warfarin tonight as abx's were changed and due to drop in INR 2) Daily INR  Adrian Saran, PharmD, BCPS Pager [phone] 02/19/2014 9:35 AM

## 2014-02-19 NOTE — Progress Notes (Signed)
Axillary abscess  Subjective: Pt feels better.  Tolerated dressing change ok last night.  Cultures: Gram + diplococci on stain  Objective: Vital signs in last 24 hours: Temp:  [97.4 F (36.3 C)-98.7 F (37.1 C)] 98.5 F (36.9 C) (04/05 0440) Pulse Rate:  [70-90] 71 (04/05 0440) Resp:  [11-20] 16 (04/05 0440) BP: (128-186)/(51-75) 155/72 mmHg (04/05 0440) SpO2:  [91 %-100 %] 98 % (04/05 0440) Last BM Date: 02/16/14  Intake/Output from previous day: 04/04 0701 - 04/05 0700 In: 1676.3 [P.O.:360; I.V.:966.3; IV Piggyback:350] Out: 1325 [Urine:1300; Blood:25] Intake/Output this shift:    General appearance: alert and cooperative Skin: Skin color, texture, turgor normal. No rashes or lesions Incision/Wound: packed, erythema better  Lab Results:  Results for orders placed during the hospital encounter of 02/16/14 (from the past 24 hour(s))  ANAEROBIC CULTURE     Status: None   Collection Time    02/18/14 10:35 AM      Result Value Ref Range   Specimen Description ABSCESS RIGHT AXILLA     Special Requests NONE     Gram Stain       Value: MODERATE WBC PRESENT, PREDOMINANTLY PMN     RARE SQUAMOUS EPITHELIAL CELLS PRESENT     MODERATE GRAM POSITIVE COCCI IN PAIRS     IN CLUSTERS     Performed at [facility]   Culture PENDING     Report Status PENDING    CULTURE, ROUTINE-ABSCESS     Status: None   Collection Time    02/18/14 10:35 AM      Result Value Ref Range   Specimen Description ABSCESS RIGHT AXILLA     Special Requests NONE     Gram Stain       Value: FEW WBC PRESENT, PREDOMINANTLY PMN     NO SQUAMOUS EPITHELIAL CELLS SEEN     FEW GRAM POSITIVE COCCI IN PAIRS     IN CLUSTERS     Performed at [facility]   Culture       Value: NO GROWTH 1 DAY     Performed at [facility]   Report Status PENDING    GLUCOSE, CAPILLARY     Status: Abnormal   Collection Time    02/18/14 10:39 AM      Result Value Ref Range   Glucose-Capillary 230 (*) 70  - 99 mg/dL   Comment 1 Documented in Chart    GLUCOSE, CAPILLARY     Status: Abnormal   Collection Time    02/18/14 11:26 AM      Result Value Ref Range   Glucose-Capillary 212 (*) 70 - 99 mg/dL   Comment 1 Documented in Chart    GLUCOSE, CAPILLARY     Status: Abnormal   Collection Time    02/18/14 12:36 PM      Result Value Ref Range   Glucose-Capillary 185 (*) 70 - 99 mg/dL  GLUCOSE, CAPILLARY     Status: Abnormal   Collection Time    02/18/14  3:02 PM      Result Value Ref Range   Glucose-Capillary 229 (*) 70 - 99 mg/dL  GLUCOSE, CAPILLARY     Status: Abnormal   Collection Time    02/18/14  5:30 PM      Result Value Ref Range   Glucose-Capillary 300 (*) 70 - 99 mg/dL  GLUCOSE, CAPILLARY     Status: Abnormal   Collection Time    02/18/14  9:42 PM  Result Value Ref Range   Glucose-Capillary 402 (*) 70 - 99 mg/dL   Comment 1 Documented in Chart     Comment 2 Notify RN    PROTIME-INR     Status: Abnormal   Collection Time    02/19/14  4:38 AM      Result Value Ref Range   Prothrombin Time 18.5 (*) 11.6 - 15.2 seconds   INR 1.59 (*) 0.00 - 1.49  GLUCOSE, CAPILLARY     Status: Abnormal   Collection Time    02/19/14  7:10 AM      Result Value Ref Range   Glucose-Capillary 191 (*) 70 - 99 mg/dL     Studies/Results Radiology     MEDS, Scheduled .  ceFAZolin (ANCEF) IV  2 g Intravenous 3 times per day  . insulin aspart  0-20 Units Subcutaneous TID WC  . insulin aspart  0-5 Units Subcutaneous QHS  . insulin glargine  40 Units Subcutaneous QHS  . warfarin  5 mg Oral ONCE-1800  . Warfarin - Pharmacist Dosing Inpatient   Does not apply q1800     Assessment: Axillary abscess   Plan: Cultures show G + organisms, will d/c cipro, Cont Ancef Cont dressing changes today May be ready for d/c with home health in AM Blood glucose levels better, so far on home dose of Lantus  LOS: 3 days    Rosario Adie, MD [facility],  [place]   02/19/2014 9:41 AM

## 2014-02-19 NOTE — Care Management Note (Signed)
    Page 1 of 1   02/19/2014     5:11:00 PM   CARE MANAGEMENT NOTE 02/19/2014  Patient:  [name], [name]   Account Number:  [id number]  Date Initiated:  02/19/2014  Documentation initiated by:  Dessa Phi  Subjective/Objective Assessment:   73 Y/O F ADMITTED W/R AXILLARY ABSCESS.     Action/Plan:   FROM HOME ALONE.HAS PCP,PHARMACY.   Anticipated DC Date:  02/21/2014   Anticipated DC Plan:  [place]  CM consult      Choice offered to / List presented to:  C-1 Patient           Status of service:  In process, will continue to follow Medicare Important Message given?   (If response is "NO", the following Medicare IM given date fields will be blank) Date Medicare IM given:   Date Additional Medicare IM given:    Discharge Disposition:    Per UR Regulation:    If discussed at Long Length of Stay Meetings, dates discussed:    Comments:  02/19/14 Rayn Enderson RN,BSN NCM [phone] CM CONS FOR HH.SPOKE TO PATIENT ABOUT HHC HHRN FOR DSG CHANGES.SHE STATES LIVES ALONE, & HAVE NO ONE WHO IS WILLING TO LEARN HOW TO DO WOUND CARE.EXPLAINED THAT THIS MAY TAKE SOME THOUGHT BUT AS LONG AS YOU OR SOMEONE IS WILLING TO LEARN THAT IS THE FIRST STEP,& IT MAY THE HHC RN A LONG TIME TO TEACH,BUT SOMEONE MUST COMMITT TO BE WILLING TO LEARN FOR A HHC AGENCY TO ACCEPT THE REFERRAL OR IT MAY BE CONSIDERED TOO UNSAFE.PATIENT VOICED UNDERSTANDING.PROVIDED [facility] AGENCY LIST.SPOKE TO NURSE OUTSIDE OF RM WHO STATES PATIENT HAS A DTR WHO IS WILLING BUT PATIENT DOESN'T WANT HER TO DO DSG CHANGES.WILL CONTINUE TO REASSURE PATIENT THAT WE ARE HERE TO HELP HER.WILL AWAIT FINAL HHRN ORDER.

## 2014-02-20 ENCOUNTER — Ambulatory Visit: Admission: RE | Admit: 2014-02-20 | Payer: Medicare Other | Source: Ambulatory Visit | Admitting: Radiation Oncology

## 2014-02-20 ENCOUNTER — Encounter (INDEPENDENT_AMBULATORY_CARE_PROVIDER_SITE_OTHER): Payer: Medicare Other | Admitting: General Surgery

## 2014-02-20 ENCOUNTER — Encounter (HOSPITAL_COMMUNITY): Payer: Self-pay | Admitting: General Surgery

## 2014-02-20 LAB — PROTIME-INR
INR: 1.33 (ref 0.00–1.49)
Prothrombin Time: 16.2 seconds — ABNORMAL HIGH (ref 11.6–15.2)

## 2014-02-20 LAB — GLUCOSE, CAPILLARY
Glucose-Capillary: 240 mg/dL — ABNORMAL HIGH (ref 70–99)
Glucose-Capillary: 325 mg/dL — ABNORMAL HIGH (ref 70–99)

## 2014-02-20 MED ORDER — DOXYCYCLINE HYCLATE 100 MG PO TABS
100.0000 mg | ORAL_TABLET | Freq: Two times a day (BID) | ORAL | Status: DC
  Filled 2014-02-20 (×2): qty 1

## 2014-02-20 MED ORDER — TRAMADOL HCL 50 MG PO TABS: 50.0000 mg | ORAL_TABLET | Freq: Four times a day (QID) | ORAL | Status: DC | PRN

## 2014-02-20 MED ORDER — CEPHALEXIN 500 MG PO CAPS: 500.0000 mg | ORAL_CAPSULE | Freq: Three times a day (TID) | ORAL | Status: DC

## 2014-02-20 MED ORDER — DOXYCYCLINE HYCLATE 100 MG PO CAPS: 100.0000 mg | ORAL_CAPSULE | Freq: Two times a day (BID) | ORAL | Status: DC

## 2014-02-20 MED ORDER — CEPHALEXIN 500 MG PO CAPS
500.0000 mg | ORAL_CAPSULE | Freq: Three times a day (TID) | ORAL | Status: DC
  Filled 2014-02-20 (×3): qty 1

## 2014-02-20 MED ORDER — HYDROCODONE-ACETAMINOPHEN 5-325 MG PO TABS: 1.0000 | ORAL_TABLET | ORAL | Status: DC | PRN

## 2014-02-20 NOTE — Telephone Encounter (Signed)
Pt calling wanting to let you know that she is out of the hospital.

## 2014-02-20 NOTE — Progress Notes (Signed)
Patient was stable at discharge. Reviewed discharge education with patient's daughter. Reviewed dressing change with daughter as well. Clarified her questions about technique. Patient and family had no further questions after all of that was reviewed. IV removed. Patient left with belongings and prescription in hand.

## 2014-02-20 NOTE — Discharge Instructions (Signed)
Home health will help you with dressing changes which should be done with 1/2 inch iodoform gauze packing and dry gauze on top with tape twice a day.  Abscess An abscess is an infected area that contains a collection of pus and debris.It can occur in almost any part of the body. An abscess is also known as a furuncle or boil. CAUSES  An abscess occurs when tissue gets infected. This can occur from blockage of oil or sweat glands, infection of hair follicles, or a minor injury to the skin. As the body tries to fight the infection, pus collects in the area and creates pressure under the skin. This pressure causes pain. People with weakened immune systems have difficulty fighting infections and get certain abscesses more often.  SYMPTOMS Usually an abscess develops on the skin and becomes a painful mass that is red, warm, and tender. If the abscess forms under the skin, you may feel a moveable soft area under the skin. Some abscesses break open (rupture) on their own, but most will continue to get worse without care. The infection can spread deeper into the body and eventually into the bloodstream, causing you to feel ill.  DIAGNOSIS  Your caregiver will take your medical history and perform a physical exam. A sample of fluid may also be taken from the abscess to determine what is causing your infection. TREATMENT  Your caregiver may prescribe antibiotic medicines to fight the infection. However, taking antibiotics alone usually does not cure an abscess. Your caregiver may need to make a small cut (incision) in the abscess to drain the pus. In some cases, gauze is packed into the abscess to reduce pain and to continue draining the area. HOME CARE INSTRUCTIONS   Only take over-the-counter or prescription medicines for pain, discomfort, or fever as directed by your caregiver.  If you were prescribed antibiotics, take them as directed. Finish them even if you start to feel better.  If gauze is used,  follow your caregiver's directions for changing the gauze.  To avoid spreading the infection:  Keep your draining abscess covered with a bandage.  Wash your hands well.  Do not share personal care items, towels, or whirlpools with others.  Avoid skin contact with others.  Keep your skin and clothes clean around the abscess.  Keep all follow-up appointments as directed by your caregiver. SEEK MEDICAL CARE IF:   You have increased pain, swelling, redness, fluid drainage, or bleeding.  You have muscle aches, chills, or a general ill feeling.  You have a fever. MAKE SURE YOU:   Understand these instructions.  Will watch your condition.  Will get help right away if you are not doing well or get worse. Document Released: 08/13/2005 Document Revised: 05/04/2012 Document Reviewed: 01/16/2012 [facility] Patient Information 2014 [place].

## 2014-02-20 NOTE — Telephone Encounter (Signed)
I would like to see her in 1 week for hospital follow up.

## 2014-02-20 NOTE — Discharge Summary (Signed)
Physician Discharge Summary  Patient ID: [name] MRN: [id number] DOB/AGE: 1941/04/29 73 y.o.  Admit date: 02/16/2014 Discharge date: 02/20/2014  Admitting Diagnosis: Right axillary abscess Breast cancer  Discharge Diagnosis Patient Active Problem List   Diagnosis Date Noted  . Axillary abscess 02/18/2014  . Urinary incontinence 01/31/2014  . Stye 10/27/2013  . Headache(784.0) 10/27/2013  . Breast pain, left 10/18/2013  . Breast cancer of upper-inner quadrant of right female breast 10/10/2013  . Unspecified venous (peripheral) insufficiency 09/22/2013  . Cellulitis of leg, left 08/08/2013  . 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 Venous stasis of lower extremity 07/01/2013  . S/P lumpectomy, right breast 06/27/2013  . T1a N0 Invasive Ductal Carcinoma of the right breast 06/17/2013  . Otitis externa 03/19/2013  . Sciatica of left side 01/24/2013  . Intertrigo 12/20/2012  . Multiple falls 12/20/2012  . Anemia 08/30/2012  . H/O exertional chest pain 08/30/2012  . Hearing problem 08/30/2012  . Chronic anticoagulation 04/19/2012  . Eczema 04/18/2012  . Refusal of blood transfusions as patient is Jehovah's Witness 04/13/2012  . Back pain 03/12/2012  . Plantar fasciitis 12/19/2011  . Overactive bladder 07/28/2011  . 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 SLEEP APNEA, OBSTRUCTIVE 07/04/2010  . THYROID NODULE 04/29/2010  . DIABETES MELLITUS, TYPE II, UNCONTROLLED 04/29/2010  . HYPERLIPIDEMIA 04/29/2010  . NEUROPATHY 04/29/2010  . Edema 04/29/2010  . HYPERTENSION 10/09/2008  . OSTEOARTHRITIS 10/09/2008  . COLONIC POLYPS, HYPERPLASTIC, HX OF 10/09/2008  . GERD 09/04/2008  . OSTEOPENIA 06/22/2007    Consultants None  Imaging: No results found.  Procedures Dr. Marcello Moores (02/18/14) - Incision and drainage of right axillary abscess  Hospital Course:  73 y/o female status post  right breast lumpectomy (06/17/13) and radiation therapy for ductal carcinoma in situ.  After surgery she developed a lymph fluid collections which was aspirated in the ED.  She continued to have a "pimple" at the aspiration site.  She has had cellulitis and infection in the breast last several weeks which stemmed from this pimple. She has been on several oral antibiotics without improvement including doxycycline, clindamycin, and diflucan. She is currently on Coumadin.  Patient was admitted and underwent procedure listed above.  Tolerated procedure well and was transferred to the floor.  Diet was advanced as tolerated.  She required 2 days of pain control until her pain medication could be weaned to orals alone.  On POD #2, the patient was voiding well, tolerating diet, ambulating well, pain well controlled, vital signs stable, incisions c/d/i and felt stable for discharge home.  The daughter of the patient has agreed to learn to pack the wound with the help of [facility] agency.  She will pack with 1/2 inch iodoform gauze BID with dry gauze on top and tape.  She is encouraged to get into the shower after the packing is removed to wash out the wound with an antibacterial soap.  After showering the packing can be replaced.  Patient will follow up in our office in 1-2 weeks and knows to call with questions or concerns.  I discussed with pharmacy antibiotics which will include Keflex and doxycycline to cover staph and strep bacteria.  Await culture results.  I have advised them to call the office on Wednesday to check culture results to ensure they are on the right antibiotics.  Physical Exam: General:  Alert, NAD, pleasant, comfortable Right axilla:  3cm wide incision with 2cm depth.  Wound with some yellow slough, but some beefy red granulation tissue, some cloudy tan drainage from wound peri-wound erythema extends about 6-8cm.  Induration extends about 4cm around the wound.      Medication List    STOP taking  these medications       clindamycin 300 MG capsule  Commonly known as:  CLEOCIN     fluconazole 100 MG tablet  Commonly known as:  DIFLUCAN     niacin 500 MG tablet      TAKE these medications       amLODipine 5 MG tablet  Commonly known as:  NORVASC  Take 1 tablet (5 mg total) by mouth daily.     anastrozole 1 MG tablet  Commonly known as:  ARIMIDEX  Take 1 mg by mouth daily.     Calcium-Vitamin D 600-200 MG-UNIT Caps  Take 1 tablet by mouth daily.     cephALEXin 500 MG capsule  Commonly known as:  KEFLEX  Take 1 capsule (500 mg total) by mouth every 8 (eight) hours.     doxycycline 100 MG capsule  Commonly known as:  VIBRAMYCIN  Take 1 capsule (100 mg total) by mouth 2 (two) times daily.     FREESTYLE LITE Devi     FREESTYLE LITE test strip  Generic drug:  glucose blood  Use to check blood sugar 4 times a day.     GLUCOSAMINE CHONDR COMPLEX PO  Take 1 capsule by mouth daily.     HYDROcodone-acetaminophen 5-325 MG per tablet  Commonly known as:  NORCO/VICODIN  Take 1-2 tablets by mouth every 4 (four) hours as needed for moderate pain.     insulin glargine 100 UNIT/ML injection  Commonly known as:  LANTUS  Inject 0.4 mLs (40 Units total) into the skin at bedtime.     insulin regular 100 units/mL injection  Commonly known as:  NOVOLIN R,HUMULIN R  Inject into the skin 3 (three) times daily before meals. Sliding scale 16-30  units     lisinopril 40 MG tablet  Commonly known as:  PRINIVIL,ZESTRIL  Take 40 mg by mouth daily.     metoprolol succinate 50 MG 24 hr tablet  Commonly known as:  TOPROL-XL  25 mg daily. Take 1 tablet by mouth daily     nystatin ointment  Commonly known as:  MYCOSTATIN  Apply 1 application topically 2 (two) times daily as needed (rash).     ONETOUCH DELICA LANCETS FINE Misc  Use to check blood sugar 4 times daily.     PROBIOTIC DAILY PO  Take 1 capsule by mouth 2 (two) times daily.     RADIAPLEX EX  Apply topically.      SUPER B COMPLEX PO  Take 1 tablet by mouth daily.     traMADol 50 MG tablet  Commonly known as:  ULTRAM  Take 50 mg by mouth every 6 (six) hours as needed for moderate pain.     warfarin 5 MG tablet  Commonly known as:  COUMADIN  Take 2.5-5 mg by mouth daily. 2.5 mg tablet on Thursday and Friday and all other days 5mg            Signed: Coralie Keens, [facility] [phone]  02/20/2014, 9:27 AM

## 2014-02-21 LAB — CULTURE, ROUTINE-ABSCESS

## 2014-02-21 NOTE — Telephone Encounter (Signed)
Appointment scheduled for 03/01/14

## 2014-02-22 ENCOUNTER — Telehealth (INDEPENDENT_AMBULATORY_CARE_PROVIDER_SITE_OTHER): Payer: Self-pay

## 2014-02-22 MED ORDER — DOXYCYCLINE HYCLATE 100 MG PO CAPS: 100.0000 mg | ORAL_CAPSULE | Freq: Two times a day (BID) | ORAL | Status: DC

## 2014-02-22 NOTE — Telephone Encounter (Signed)
I called patient and went over culture results. Per Dr Marlou Starks pt can stop the keflex because it will not do much for her infection but to stay on doxy as it will help best. Offered appointment for Friday with Dr Marlou Starks but she states she just wants to follow up with Dr Jenetta DownerConley Canal next week. HH is currently going out every day. She states she was upset Dr Marlou Starks "did not want to see he when all this was going on." I explained in detail that Dr Marlou Starks has been out of the office for 3 weeks and wasn't here to see her. Explained our doctors are away from office sometimes for a good length of time due to surgery schedule, on call schedule and then vacation. Dr Marlou Starks would have seen her if he was here. Advised her to call us if she needs [place] for any reason. We will see her at her next scheduled LTF appt. Unless sooner appt is needed. Pt asked for refill of doxycycline because she does know where hers is at. She does not remember getting it filled on 02/20/14.

## 2014-02-23 LAB — ANAEROBIC CULTURE

## 2014-02-24 NOTE — Telephone Encounter (Signed)
[facility] called asking for a post op appt. Appt with DR.Marlou Starks 02-28-14@10  Patient agreed with Date/time

## 2014-02-27 ENCOUNTER — Telehealth: Payer: Self-pay | Admitting: *Deleted

## 2014-02-27 NOTE — Telephone Encounter (Signed)
Received call from Sherlyn Lick w/[facility]. She states they are required to obtain hgb a1c every 3 months on their diabetic pts. Advised her last a1c in our system was 09/20/13 and pt has f/u with [place] on 03/03/14 and we can obtain test at that time. After further review of pt's record I see that pt is being followed by Dr Posey Pronto at [facility] and most recent a1c from them was dated 11/25/13 and was 8.1%. Called back and left detailed message on Sara's voicemail [phone]) to contact [facility] to determine when they will see pt again and to call me back if any questions.

## 2014-02-28 ENCOUNTER — Telehealth: Payer: Self-pay | Admitting: Radiation Oncology

## 2014-02-28 ENCOUNTER — Telehealth (INDEPENDENT_AMBULATORY_CARE_PROVIDER_SITE_OTHER): Payer: Self-pay

## 2014-02-28 ENCOUNTER — Encounter (INDEPENDENT_AMBULATORY_CARE_PROVIDER_SITE_OTHER): Payer: Medicare Other | Admitting: General Surgery

## 2014-02-28 DIAGNOSIS — L02411 Cutaneous abscess of right axilla: Secondary | ICD-10-CM

## 2014-02-28 MED ORDER — DOXYCYCLINE HYCLATE 100 MG PO CAPS: 100.0000 mg | ORAL_CAPSULE | Freq: Two times a day (BID) | ORAL | Status: DC

## 2014-02-28 NOTE — Telephone Encounter (Signed)
Phoned patient to assess status. She reports that her breast continue to hurt and feels "like the worst sunburn ever." She explains [facility] is coming out regularly to assess her wound but, her daughter packs and dresses it. She reports she stopped Keflex per Dr. Ethlyn Gallery direction after culture came back. She reports she is only taking doxycycline now. She reports she is out of doxycycline tablets. Advised her to contact Dr. Ethlyn Gallery office for refill. She states, "I had a follow up scheduled with him today but, he cancelled because of an emergency with his wife." Asked if the appointment was rescheduled and she explained it wasn't. Culture showed staphylococcus aureaus. Reinforced that she should contact Dr. Ethlyn Gallery nurse, explains she is out of doxycycline and inquire if a refill is needed. She verbalized understanding. Encouraged her to contact this Probation officer with future need. She expressed appreciation for the call.

## 2014-02-28 NOTE — Telephone Encounter (Signed)
Refill abxs-one week worth.

## 2014-02-28 NOTE — Telephone Encounter (Signed)
Called pt back to notify her that I e prescribed the doxycycline to [facility] per Dr Zella Richer. I advised pt that I could make her an appt this week to be checked by Dr Ethlyn Gallery partner or make an appt on Monday to see Dr Marlou Starks. The pt is fine with the appt on Monday 03/06/14 to see Dr Marlou Starks b/c [facility] is coming out tomorrow to check the axilla wound. The pt will call if any changes to the wound.

## 2014-02-28 NOTE — Telephone Encounter (Signed)
Pt calling in b/c her appt with Dr Marlou Starks got canceled for today for f/u on axillary infection that had to have surgery by Dr Marcello Moores on 02/18/14 for the axillary abscess. The pt is requesting a refill on the Doxycycline 100mg  and pt is wanting to know how much longer she needs to be on the abx. I advised pt that Dr Marlou Starks had to cancel the clinic due to an emergency but I would check with one of his partners on getting a refill on Doxycycline. I advised pt that she will have to wait till Dr Marlou Starks comes back to find out about how long to stay on the abx. The pt wants to know when she needs to be seen again b/c she had to cancel so many things in order to make this appt today along with her daughter getting off work to make this appt. The pt said it's hard for the daughter to miss work so when the next appt gets made we need to check with the daughter Macey Wurtz [phone] when making the appt. The pt states that [facility] is still checking the wound but told her there is no more infection now.

## 2014-03-01 ENCOUNTER — Other Ambulatory Visit: Payer: Self-pay | Admitting: Family

## 2014-03-01 ENCOUNTER — Ambulatory Visit: Payer: Medicare Other | Admitting: Family

## 2014-03-01 ENCOUNTER — Telehealth (INDEPENDENT_AMBULATORY_CARE_PROVIDER_SITE_OTHER): Payer: Self-pay | Admitting: General Surgery

## 2014-03-01 NOTE — Telephone Encounter (Signed)
Sarah, nurse with [facility], calling to report no progress with wound healing.  ACH going out to pack wound daily. She is coming in on Monday for follow up, so Judson Roch will FAX over progress notes for MD.  Also pt does not understand that her insurance will only cover dressing changes QD, not BID.  So far no one from her family has show up to learn how to do this for her.

## 2014-03-02 ENCOUNTER — Ambulatory Visit: Payer: Medicare Other | Admitting: Family Medicine

## 2014-03-03 ENCOUNTER — Telehealth: Payer: Self-pay | Admitting: Family

## 2014-03-03 ENCOUNTER — Encounter: Payer: Self-pay | Admitting: Family

## 2014-03-03 ENCOUNTER — Ambulatory Visit (INDEPENDENT_AMBULATORY_CARE_PROVIDER_SITE_OTHER): Payer: Medicare Other | Admitting: Family

## 2014-03-03 VITALS — BP 170/70 | HR 63 | Temp 98.3°F | Resp 18 | Ht 66.5 in | Wt 204.0 lb

## 2014-03-03 DIAGNOSIS — IMO0002 Reserved for concepts with insufficient information to code with codable children: Secondary | ICD-10-CM

## 2014-03-03 DIAGNOSIS — L02419 Cutaneous abscess of limb, unspecified: Secondary | ICD-10-CM

## 2014-03-03 DIAGNOSIS — I10 Essential (primary) hypertension: Secondary | ICD-10-CM

## 2014-03-03 MED ORDER — AMLODIPINE BESYLATE 10 MG PO TABS: 10.0000 mg | ORAL_TABLET | Freq: Every day | ORAL | Status: DC

## 2014-03-03 NOTE — Assessment & Plan Note (Signed)
Improving. Wound was irrigated with normal saline and packed with sterile iodoform guaze.

## 2014-03-03 NOTE — Progress Notes (Signed)
[name] is a 73 yr old female who presents today for hospital follow up. She was admitted on 4/2-4/6 for right breast cellulitis.  She underwent I and D of right axillary abscess on 02/18/14 with Dr. Marcello Moores.  Culture grew MSSA. She was sent home with iodoform Gauze packing bid and was advised to follow up with surgeon in 1-2 weeks. She was discharged on keflex per discharge summary (per pt they gave her doxycyline though not keflex).She sees the surgeon on Monday.   HTN-  BP meds include: toprol xl 50mg , amlodipine 5mg , lisinopril 40mg .    ROS  See HPI  Past Medical History  Diagnosis Date  . Hyperplastic colon polyp   . Diabetes mellitus type II   . Osteoarthritis   . GERD (gastroesophageal reflux disease)   . Osteopenia   . Lupus     of skin see dermatologist frequently  . OA (osteoarthritis of spine)     C-spine  . DVT (deep venous thrombosis)     right leg DVT 05-17-2008  . Leg swelling     left leg 2-10, u/s showed a old clot, has a hypercoag panel,  . History of cardiovascular stress test     10/09 had CP, stress test neg, likely GI (GERD)-09/2006-had CP: stress test (-)  . 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 DVT (deep vein thrombosis) in pregnancy   . DVT of leg (deep venous thrombosis) 07/28/2011  . 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 Allergy   . Clotting disorder     hx of PE and DVT  . C. difficile diarrhea     hx of,   . Skin cancer     skin CA  . Left leg pain 03/12/2013  . Otitis externa 03/19/2013    right  . PONV (postoperative nausea and vomiting)   . Hypertension     pcp  dr Conley Canal   h.p   [facility]  . 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 Marital Status: Widowed    Spouse Name: N/A    Number of Children: 3  . Years of Education: N/A   Occupational History  . Retired    Social History Main Topics  .  Smoking status: Never Smoker   . Smokeless tobacco: Never Used  . Alcohol Use: No  . Drug Use: No  . Sexual Activity: Not Currently   Other Topics Concern  . Not on file   Social History Narrative   Single-widow    3 children , 1 in [place] (son, daughter - [place])    [place] to [place] from [place], [place]   Alcohol Use - no      tobacco-- never     Illicit Drug Use - no    Pt is Jehovah's Witness-No blood products   Daughter - [place]avia Frith           Past Surgical History  Procedure Laterality Date  . Cholecystectomy    . Breast biopsy      left breast and right breast  . Breast lumpectomy      left breast-ductal carcinoma in situ  . Appendectomy    . Skin cancer excision      from back   . Breast lumpectomy with needle localization and axillary sentinel lymph node  bx Right 06/17/2013    Procedure: BREAST LUMPECTOMY WITH NEEDLE LOCALIZATION AND AXILLARY SENTINEL LYMPH NODE BX;  Surgeon: Merrie Roof, MD;  Location: [facility];  Service: General;  Laterality: Right;  . Irrigation and debridement abscess Right 02/18/2014    Procedure: IRRIGATION AND DEBRIDEMENT RIGHT AXILLARY ABSCESS;  Surgeon: Leighton Ruff, MD;  Location: [facility];  Service: General;  Laterality: Right;    Family History  Problem Relation Age of Onset  . Diabetes Mother   . Diabetes    . Heart attack Neg Hx   . Colon cancer Neg Hx   . Breast cancer Neg Hx   . Esophageal cancer Neg Hx   . Rectal cancer Neg Hx   . Stomach cancer Neg Hx   . Cancer Father     melanoma  . Diabetes Brother   . Other Brother     amputation    Allergies  Allergen Reactions  . Metformin Diarrhea  . Pioglitazone Other (See Comments)    REACTION: wt gain  . Vancomycin     Erythematous rash, hands and toes became cyanotic.      Current Outpatient Prescriptions on File Prior to Visit  Medication Sig Dispense Refill  . amLODipine (NORVASC) 5 MG tablet Take 1 tablet (5 mg total) by mouth daily.  30 tablet  1  . anastrozole  (ARIMIDEX) 1 MG tablet Take 1 mg by mouth daily.      . B Complex-C (SUPER B COMPLEX PO) Take 1 tablet by mouth daily.      . Blood Glucose Monitoring Suppl (FREESTYLE LITE) DEVI       . Calcium Carbonate-Vitamin D (CALCIUM-VITAMIN D) 600-200 MG-UNIT CAPS Take 1 tablet by mouth daily.       Marland Kitchen doxycycline (VIBRAMYCIN) 100 MG capsule Take 1 capsule (100 mg total) by mouth 2 (two) times daily.  20 capsule  0  . FREESTYLE LITE test strip Use to check blood sugar 4 times a day.      . Glucosamine-Chondroitin (GLUCOSAMINE CHONDR COMPLEX PO) Take 1 capsule by mouth daily.      . insulin glargine (LANTUS) 100 UNIT/ML injection Inject 0.4 mLs (40 Units total) into the skin at bedtime.  3 mL  0  . insulin regular (NOVOLIN R,HUMULIN R) 100 units/mL injection Inject into the skin 3 (three) times daily before meals. Sliding scale 16-30  units      . lisinopril (PRINIVIL,ZESTRIL) 40 MG tablet Take 40 mg by mouth daily.      . metoprolol succinate (TOPROL-XL) 50 MG 24 hr tablet 25 mg daily. Take 1 tablet by mouth daily      . metoprolol succinate (TOPROL-XL) 50 MG 24 hr tablet TAKE 1 TABLET BY MOUTH DAILY  30 tablet  0  . nystatin ointment (MYCOSTATIN) Apply 1 application topically 2 (two) times daily as needed (rash).      Glory Rosebush DELICA LANCETS FINE MISC Use to check blood sugar 4 times daily.      . Probiotic Product (PROBIOTIC DAILY PO) Take 1 capsule by mouth 2 (two) times daily.       . traMADol (ULTRAM) 50 MG tablet Take 50 mg by mouth every 6 (six) hours as needed for moderate pain.      . traMADol (ULTRAM) 50 MG tablet Take 1-2 tablets (50-100 mg total) by mouth every 6 (six) hours as needed.  40 tablet  0  . warfarin (COUMADIN) 5 MG tablet Take 2.5-5 mg by mouth daily. 2.5  mg tablet on Thursday and Friday and all other days 5mg       . Wound Cleansers (RADIAPLEX EX) Apply topically.       Current Facility-Administered Medications on File Prior to Visit  Medication Dose Route Frequency Provider Last  Rate Last Dose  . silver sulfADIAZINE (SILVADENE) 1 % cream   Topical Daily Lora Paula, MD        BP 170/70  Pulse 63  Temp(Src) 98.3 F (36.8 C) (Oral)  Resp 18  Ht 5' 6.5" (1.689 m)  Wt 204 lb 0.6 oz (92.552 kg)  BMI 32.44 kg/m2  SpO2 99%  Gen: awake, alert, NAD CV: S1/S2, RRR no murmur Resp: BS CTA bilaterally, no wheezes, rales, rhonchi Extrem: 1+ bilateral lower extremity edema Breast:  R breast with peau d'orange appearance.  Breast is no longer erythematous.  R axilla- approximately 1 inch wide wound, approximately 1 inch deep. Some serous drainage noted on dressing, no odor, no significant surrounding erythema or induration. Psych: A and O x 3, calm and pleasant

## 2014-03-03 NOTE — Assessment & Plan Note (Addendum)
BP Readings from Last 3 Encounters:  03/03/14 170/70  02/20/14 161/69  02/20/14 161/69   Deteriorated.  Increase amlodipine from 5mg  to 10mg .

## 2014-03-03 NOTE — Patient Instructions (Signed)
Please continue daily dressing changes. Follow up with Surgeon on Monday as scheduled.

## 2014-03-03 NOTE — Telephone Encounter (Signed)
Please contact pt and let her know that I reviewed her last 3 BP's and they have all been elevated.  Please increase amlodipine to 10mg .  Rx sent to pharmacy. Follow up for nurse visit bp check in 2 weeks. I also want to make sure that she has had a recent visit to the coumadin clinic.

## 2014-03-03 NOTE — Telephone Encounter (Signed)
Notified pt and she scheduled nurse visit for 03/17/14 at 1:45pm.

## 2014-03-03 NOTE — Progress Notes (Signed)
Pre visit review using our clinic review tool, if applicable. No additional management support is needed unless otherwise documented below in the visit note. 

## 2014-03-03 NOTE — Telephone Encounter (Signed)
Left message to return my call.  

## 2014-03-06 ENCOUNTER — Ambulatory Visit (INDEPENDENT_AMBULATORY_CARE_PROVIDER_SITE_OTHER): Payer: Medicare Other | Admitting: General Surgery

## 2014-03-06 ENCOUNTER — Encounter (INDEPENDENT_AMBULATORY_CARE_PROVIDER_SITE_OTHER): Payer: Self-pay | Admitting: General Surgery

## 2014-03-06 VITALS — BP 168/80 | HR 76 | Resp 16 | Ht 66.0 in | Wt 206.0 lb

## 2014-03-06 DIAGNOSIS — L02419 Cutaneous abscess of limb, unspecified: Secondary | ICD-10-CM

## 2014-03-06 DIAGNOSIS — IMO0002 Reserved for concepts with insufficient information to code with codable children: Secondary | ICD-10-CM

## 2014-03-06 DIAGNOSIS — C50211 Malignant neoplasm of upper-inner quadrant of right female breast: Secondary | ICD-10-CM

## 2014-03-06 DIAGNOSIS — C50219 Malignant neoplasm of upper-inner quadrant of unspecified female breast: Secondary | ICD-10-CM

## 2014-03-06 DIAGNOSIS — L02411 Cutaneous abscess of right axilla: Secondary | ICD-10-CM

## 2014-03-06 MED ORDER — DOXYCYCLINE HYCLATE 100 MG PO CAPS: 100.0000 mg | ORAL_CAPSULE | Freq: Two times a day (BID) | ORAL | Status: DC

## 2014-03-06 NOTE — Patient Instructions (Signed)
Remove dressing and shower daily then repack gauze into wound Continue doxycycline twice a day

## 2014-03-06 NOTE — Progress Notes (Signed)
Subjective:     Patient ID: [name], female   DOB: Feb 15, 1941, 73 y.o.   MRN: [id number]  HPI The patient is a 73 year old white female who is 8 months status post right lumpectomy and negative sentinel node biopsy for a T1 A. N0 Right breast cancer. She was ER and PR positive and HER-2/neu negative. Over the last few weeks she has developed some significant cellulitis of the right breast. About 2 weeks ago she required an incision and drainage of a right axillary abscess in the operating room. She has been on doxycycline. She still has significant tenderness of the right breast but she does seem a little bit improved. She has been doing dressing changes to the open wound in the right axilla.  Review of Systems     Objective:   Physical Exam On exam the right breast still has a lot of edema in it but the redness seems to be gradually improving. The right axillary open wound is clean. The wound was redressed and she tolerated this well.    Assessment:     The patient is 8 months status post right lumpectomy for breast cancer in 2 weeks status post incision and drainage of a right axillary abscess with significant breast cellulitis     Plan:     At this point I have encouraged her to shower daily and redress the wound after showering. She will continue doxycycline until the cellulitis resolves. I will plan to see her back in about 2 weeks to check her progress

## 2014-03-06 NOTE — Addendum Note (Signed)
Addended by: Carlene Coria on: 03/06/2014 12:33 PM   Modules accepted: Orders

## 2014-03-09 ENCOUNTER — Encounter (INDEPENDENT_AMBULATORY_CARE_PROVIDER_SITE_OTHER): Payer: Medicare Other | Admitting: General Surgery

## 2014-03-09 ENCOUNTER — Telehealth: Payer: Self-pay | Admitting: Oncology

## 2014-03-09 ENCOUNTER — Telehealth (INDEPENDENT_AMBULATORY_CARE_PROVIDER_SITE_OTHER): Payer: Self-pay

## 2014-03-09 NOTE — Telephone Encounter (Signed)
Sarah/[facility] is asking for wound care dressing change to - Cleanse wound with antibacteria  skin cleanser , apply skin prep around the wound , fill wound with Acticoat, strips ,apply dry dressing  . Sarah # [phone]

## 2014-03-09 NOTE — Telephone Encounter (Signed)
S/W PT ADVISED APPT 5/1 CX'D DUE TO MD LOA. GAVE NEW APPT 5/8 @ 12.30P WITH DR. Earnest Conroy. PT VERBALIZED UNDERSTANDING

## 2014-03-10 NOTE — Telephone Encounter (Signed)
They should just shower her everyday and pack wound with gauze

## 2014-03-10 NOTE — Telephone Encounter (Signed)
Called Sarah back and gave below msg.

## 2014-03-13 ENCOUNTER — Telehealth: Payer: Self-pay | Admitting: *Deleted

## 2014-03-13 NOTE — Telephone Encounter (Signed)
Received message from pt that she only has 2 days left of current amlodipine 5mg  tablets. Pt is currently taking 2 of these a day. Spoke with pt and advised her that Provider sent Rx with new strength (10mg ) once a day to pharmacy on 03/03/14 and to check with them for Rx. Pt voices understanding.

## 2014-03-14 ENCOUNTER — Telehealth: Payer: Self-pay | Admitting: Family

## 2014-03-14 NOTE — Telephone Encounter (Signed)
Left message for pt to return my call. There are 2 metoprolol strengths on pt's med list. 25mg  once a day and 50 mg once a day.  Which is correct?

## 2014-03-14 NOTE — Telephone Encounter (Signed)
Dose is 50mg  daily please.

## 2014-03-14 NOTE — Telephone Encounter (Signed)
Patient has questions regarding metoprolol dosage.

## 2014-03-16 NOTE — Telephone Encounter (Signed)
Left detailed message on machine to notify pt of correct dose of metoprolol.  Okay per DPR.

## 2014-03-17 ENCOUNTER — Ambulatory Visit: Payer: Medicare Other | Admitting: Oncology

## 2014-03-17 ENCOUNTER — Other Ambulatory Visit (INDEPENDENT_AMBULATORY_CARE_PROVIDER_SITE_OTHER): Payer: Self-pay | Admitting: General Surgery

## 2014-03-17 ENCOUNTER — Other Ambulatory Visit: Payer: Medicare Other

## 2014-03-17 ENCOUNTER — Telehealth (INDEPENDENT_AMBULATORY_CARE_PROVIDER_SITE_OTHER): Payer: Self-pay | Admitting: General Surgery

## 2014-03-17 ENCOUNTER — Ambulatory Visit (INDEPENDENT_AMBULATORY_CARE_PROVIDER_SITE_OTHER): Payer: Medicare Other | Admitting: Family

## 2014-03-17 VITALS — BP 140/60 | HR 65 | Temp 98.4°F | Resp 18 | Ht 66.5 in | Wt 203.0 lb

## 2014-03-17 DIAGNOSIS — L02419 Cutaneous abscess of limb, unspecified: Secondary | ICD-10-CM

## 2014-03-17 DIAGNOSIS — C50211 Malignant neoplasm of upper-inner quadrant of right female breast: Secondary | ICD-10-CM

## 2014-03-17 DIAGNOSIS — I10 Essential (primary) hypertension: Secondary | ICD-10-CM

## 2014-03-17 DIAGNOSIS — L02411 Cutaneous abscess of right axilla: Secondary | ICD-10-CM

## 2014-03-17 DIAGNOSIS — IMO0002 Reserved for concepts with insufficient information to code with codable children: Secondary | ICD-10-CM

## 2014-03-17 MED ORDER — DOXYCYCLINE HYCLATE 100 MG PO CAPS: 100.0000 mg | ORAL_CAPSULE | Freq: Two times a day (BID) | ORAL | Status: DC

## 2014-03-17 NOTE — Progress Notes (Signed)
Subjective:    Patient ID: [name], female    DOB: December 06, 1940, 73 y.o.   MRN: [id number]  HPI  [name] is a 73 yr old female who presents today for nurse visit blood pressure check.   BP Readings from Last 3 Encounters:  03/17/14 140/60  03/06/14 168/80  03/03/14 170/70   Wound check- pt requested that I look at her breast wound.  Her daughter has been performing wound care. Though the dressing has not been changed in 2 days.    Review of Systems See HPI  Past Medical History  Diagnosis Date  . Hyperplastic colon polyp   . Diabetes mellitus type II   . Osteoarthritis   . GERD (gastroesophageal reflux disease)   . Osteopenia   . Lupus     of skin see dermatologist frequently  . OA (osteoarthritis of spine)     C-spine  . DVT (deep venous thrombosis)     right leg DVT 05-17-2008  . Leg swelling     left leg 2-10, u/s showed a old clot, has a hypercoag panel,  . History of cardiovascular stress test     10/09 had CP, stress test neg, likely GI (GERD)-09/2006-had CP: stress test (-)  . 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 DVT (deep vein thrombosis) in pregnancy   . DVT of leg (deep venous thrombosis) 07/28/2011  . 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 Allergy   . Clotting disorder     hx of PE and DVT  . C. difficile diarrhea     hx of,   . Skin cancer     skin CA  . Left leg pain 03/12/2013  . Otitis externa 03/19/2013    right  . PONV (postoperative nausea and vomiting)   . Hypertension     pcp  dr Conley Canal   h.p   lebaur  . 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 Marital Status: Widowed    Spouse Name: N/A    Number of Children: 3  . Years of Education: N/A   Occupational History  . Retired    Social History Main Topics  . Smoking status: Never Smoker   . Smokeless tobacco: Never Used  . Alcohol Use:  No  . Drug Use: No  . Sexual Activity: Not Currently   Other Topics Concern  . Not on file   Social History Narrative   Single-widow    3 children , 1 in [place] (son, daughter - [place])    [place] from [place], [place]   Alcohol Use - no      tobacco-- never     Illicit Drug Use - no    Pt is Jehovah's Witness-No blood products   Daughter - [name]           Past Surgical History  Procedure Laterality Date  . Cholecystectomy    . Breast biopsy      left breast and right breast  . Breast lumpectomy      left breast-ductal carcinoma in situ  . Appendectomy    . Skin cancer excision      from back   . Breast lumpectomy with needle localization and axillary sentinel lymph node bx Right 06/17/2013    Procedure: BREAST  LUMPECTOMY WITH NEEDLE LOCALIZATION AND AXILLARY SENTINEL LYMPH NODE BX;  Surgeon: Merrie Roof, MD;  Location: [facility];  Service: General;  Laterality: Right;  . Irrigation and debridement abscess Right 02/18/2014    Procedure: IRRIGATION AND DEBRIDEMENT RIGHT AXILLARY ABSCESS;  Surgeon: Leighton Ruff, MD;  Location: [facility];  Service: General;  Laterality: Right;    Family History  Problem Relation Age of Onset  . Diabetes Mother   . Diabetes    . Heart attack Neg Hx   . Colon cancer Neg Hx   . Breast cancer Neg Hx   . Esophageal cancer Neg Hx   . Rectal cancer Neg Hx   . Stomach cancer Neg Hx   . Cancer Father     melanoma  . Diabetes Brother   . Other Brother     amputation    Allergies  Allergen Reactions  . Metformin Diarrhea  . Pioglitazone Other (See Comments)    REACTION: wt gain  . Vancomycin     Erythematous rash, hands and toes became cyanotic.      Current Outpatient Prescriptions on File Prior to Visit  Medication Sig Dispense Refill  . amLODipine (NORVASC) 10 MG tablet Take 1 tablet (10 mg total) by mouth daily.  30 tablet  3  . anastrozole (ARIMIDEX) 1 MG tablet Take 1 mg by mouth daily.      . B Complex-C (SUPER B  COMPLEX PO) Take 1 tablet by mouth daily.      . Blood Glucose Monitoring Suppl (FREESTYLE LITE) DEVI       . Calcium Carbonate-Vitamin D (CALCIUM-VITAMIN D) 600-200 MG-UNIT CAPS Take 1 tablet by mouth daily.       Marland Kitchen doxycycline (VIBRAMYCIN) 100 MG capsule Take 1 capsule (100 mg total) by mouth 2 (two) times daily.  40 capsule  0  . FREESTYLE LITE test strip Use to check blood sugar 4 times a day.      . Glucosamine-Chondroitin (GLUCOSAMINE CHONDR COMPLEX PO) Take 1 capsule by mouth daily.      . insulin glargine (LANTUS) 100 UNIT/ML injection Inject 0.4 mLs (40 Units total) into the skin at bedtime.  3 mL  0  . insulin regular (NOVOLIN R,HUMULIN R) 100 units/mL injection Inject into the skin 3 (three) times daily before meals. Sliding scale 16-30  units      . lisinopril (PRINIVIL,ZESTRIL) 40 MG tablet Take 40 mg by mouth daily.      . metoprolol succinate (TOPROL-XL) 50 MG 24 hr tablet TAKE 1 TABLET BY MOUTH DAILY  30 tablet  0  . nystatin ointment (MYCOSTATIN) Apply 1 application topically 2 (two) times daily as needed (rash).      Glory Rosebush DELICA LANCETS FINE MISC Use to check blood sugar 4 times daily.      . Probiotic Product (PROBIOTIC DAILY PO) Take 1 capsule by mouth 2 (two) times daily.       . traMADol (ULTRAM) 50 MG tablet Take 1-2 tablets (50-100 mg total) by mouth every 6 (six) hours as needed.  40 tablet  0  . warfarin (COUMADIN) 5 MG tablet Take 2.5-5 mg by mouth daily. 2.5 mg tablet on Thursday and Friday and all other days 5mg       . Wound Cleansers (RADIAPLEX EX) Apply topically.       Current Facility-Administered Medications on File Prior to Visit  Medication Dose Route Frequency Provider Last Rate Last Dose  . silver sulfADIAZINE (SILVADENE) 1 % cream  Topical Daily Lora Paula, MD        BP 140/60  Pulse 65  Temp(Src) 98.4 F (36.9 C) (Oral)  Resp 18  Ht 5' 6.5" (1.689 m)  Wt 203 lb 0.6 oz (92.098 kg)  BMI 32.28 kg/m2  SpO2 99%       Objective:    Physical Exam  Constitutional: She appears well-developed and well-nourished. No distress.  Skin:  R breast wound is clean with granulation tissue at the base. Wound cavity is smaller than noted last visit.           Assessment & Plan:

## 2014-03-17 NOTE — Assessment & Plan Note (Signed)
Improved.  She was very confused about her medications.   Bottles were organized for her today. Repeat BP in 2 weeks.

## 2014-03-17 NOTE — Telephone Encounter (Signed)
Pt called for refill of Doxycycline; she is still resolving the cellulitis and has follow up appt on Tues.  Renewed the Rx via email to [facility].

## 2014-03-17 NOTE — Assessment & Plan Note (Signed)
Wound was repacked with moist, sterile iodoform gauze. She has 2 days left of doxycycline. Complete abx then should be ok to stop abx a this point. Advised her that daughter should be changing dressing daily.

## 2014-03-17 NOTE — Patient Instructions (Signed)
OK to stop doxycycline when complete. Follow up in 2 weeks.

## 2014-03-21 ENCOUNTER — Encounter (INDEPENDENT_AMBULATORY_CARE_PROVIDER_SITE_OTHER): Payer: Self-pay | Admitting: General Surgery

## 2014-03-21 ENCOUNTER — Ambulatory Visit (INDEPENDENT_AMBULATORY_CARE_PROVIDER_SITE_OTHER): Payer: Medicare Other | Admitting: General Surgery

## 2014-03-21 VITALS — BP 124/80 | HR 72 | Temp 98.3°F | Resp 14 | Ht 66.0 in | Wt 205.0 lb

## 2014-03-21 DIAGNOSIS — C50219 Malignant neoplasm of upper-inner quadrant of unspecified female breast: Secondary | ICD-10-CM

## 2014-03-21 DIAGNOSIS — L02419 Cutaneous abscess of limb, unspecified: Secondary | ICD-10-CM

## 2014-03-21 DIAGNOSIS — IMO0002 Reserved for concepts with insufficient information to code with codable children: Secondary | ICD-10-CM

## 2014-03-21 NOTE — Progress Notes (Signed)
Subjective:     Patient ID: [name], female   DOB: 04/18/1941, 73 y.o.   MRN: [id number]  HPI The patient is a 72 year old white female who is 9 months status post right lumpectomy and negative sentinel node biopsy for a T1 A. N0 right breast cancer. She was ER and PR positive and HER-2/neu negative. After finishing radiation therapy she developed some cellulitis in her breast and axilla. She had to have an incision and drainage of an abscess in the right axilla. She mostly complains of soreness at the operative site. She denies any fevers or chills.  Review of Systems     Objective:   Physical Exam On exam her right breast is significantly less swollen. Most of the erythema has resolved. She still has some radiation changes to the right breast. Her wound in the right axilla is very clean and shallow. I redressed it today and she tolerated this well.    Assessment:     The patient is 9 months status post right lumpectomy for breast cancer and several weeks status post incision and drainage of an abscess in the right axilla     Plan:     At this point she will continue to shower and keep the wound dressed. I will plan to see her back in 3-4 weeks to check her progress

## 2014-03-21 NOTE — Patient Instructions (Signed)
Shower daily and pack or cover wound

## 2014-03-22 ENCOUNTER — Other Ambulatory Visit: Payer: Self-pay | Admitting: Family

## 2014-03-22 NOTE — Telephone Encounter (Signed)
Denial sent to pharmacy for amlodipine 5mg  as pt dose was increased to 10mg . Spoke with pharmacist and confirmed they received 03/03/14 rx or 10mg  strength and they will note in their computer not to use 5mg .

## 2014-03-23 ENCOUNTER — Telehealth: Payer: Self-pay | Admitting: Oncology

## 2014-03-23 NOTE — Telephone Encounter (Signed)
, °

## 2014-03-24 ENCOUNTER — Other Ambulatory Visit: Payer: Medicare Other

## 2014-03-24 ENCOUNTER — Ambulatory Visit: Payer: Medicare Other

## 2014-03-30 ENCOUNTER — Ambulatory Visit: Payer: Medicare Other | Admitting: Radiation Oncology

## 2014-03-31 ENCOUNTER — Encounter: Payer: Self-pay | Admitting: Family

## 2014-03-31 ENCOUNTER — Ambulatory Visit (INDEPENDENT_AMBULATORY_CARE_PROVIDER_SITE_OTHER): Payer: Medicare Other | Admitting: Family

## 2014-03-31 VITALS — BP 120/60 | HR 67 | Temp 98.1°F | Resp 16 | Ht 66.5 in | Wt 205.1 lb

## 2014-03-31 DIAGNOSIS — L02419 Cutaneous abscess of limb, unspecified: Secondary | ICD-10-CM

## 2014-03-31 DIAGNOSIS — IMO0002 Reserved for concepts with insufficient information to code with codable children: Secondary | ICD-10-CM

## 2014-03-31 NOTE — Patient Instructions (Addendum)
Please follow up in June as scheduled.

## 2014-03-31 NOTE — Progress Notes (Signed)
Pre visit review using our clinic review tool, if applicable. No additional management support is needed unless otherwise documented below in the visit note. 

## 2014-03-31 NOTE — Assessment & Plan Note (Signed)
No sign of infection currently. Wound was repacked today.  Advise pt to continue packing wound daily until completely healed.

## 2014-03-31 NOTE — Progress Notes (Signed)
Subjective:    Patient ID: [name], female    DOB: [birth year], 73 y.o.   MRN: [id number]  HPI  [name] is a 73 yr old female who presents today for follow up of left axillary abscess.  He felt that her wound was healing well at that point. She was admitted on 4/2-4/6 for right breast cellulitis. She underwent I and D of right axillary abscess on 02/18/14 with Dr. Marcello Moores. Culture grew MSSA. She was sent home with iodoform Gauze packing bid and was advised to follow up with surgeon in 1-2 weeks.   Saw Dr. Marlou Starks on 03/21/14 for follow up.  She reports that her daughter continues to pack the wound and that it is improving.    Review of Systems See HPI  Past Medical History  Diagnosis Date  . Hyperplastic colon polyp   . Diabetes mellitus type II   . Osteoarthritis   . GERD (gastroesophageal reflux disease)   . Osteopenia   . Lupus     of skin see dermatologist frequently  . OA (osteoarthritis of spine)     C-spine  . DVT (deep venous thrombosis)     right leg DVT 05-17-2008  . Leg swelling     left leg 2-10, u/s showed a old clot, has a hypercoag panel,  . History of cardiovascular stress test     10/09 had CP, stress test neg, likely GI (GERD)-09/2006-had CP: stress test (-)  . 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 DVT (deep vein thrombosis) in pregnancy   . DVT of leg (deep venous thrombosis) 07/28/2011  . 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 Allergy   . Clotting disorder     hx of PE and DVT  . C. difficile diarrhea     hx of,   . Skin cancer     skin CA  . Left leg pain 03/12/2013  . Otitis externa 03/19/2013    right  . PONV (postoperative nausea and vomiting)   . Hypertension     pcp  dr Conley Canal   h.p   lebaur  . 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 Marital Status: Widowed    Spouse Name: N/A    Number of  Children: 3  . Years of Education: N/A   Occupational History  . Retired    Social History Main Topics  . Smoking status: Never Smoker   . Smokeless tobacco: Never Used  . Alcohol Use: No  . Drug Use: No  . Sexual Activity: Not Currently   Other Topics Concern  . Not on file   Social History Narrative   Single-widow    3 children , 1 in [place] (son, daughter - [place])    [place] to [place] 2002 from [place], [place]   Alcohol Use - no      tobacco-- never     Illicit Drug Use - no    Pt is Jehovah's Witness-No blood products   Daughter - Renelle Stegenga           Past Surgical History  Procedure Laterality Date  . Cholecystectomy    . Breast biopsy      left breast and right breast  . Breast lumpectomy      left breast-ductal carcinoma in situ  .  Appendectomy    . Skin cancer excision      from back   . Breast lumpectomy with needle localization and axillary sentinel lymph node bx Right 06/17/2013    Procedure: BREAST LUMPECTOMY WITH NEEDLE LOCALIZATION AND AXILLARY SENTINEL LYMPH NODE BX;  Surgeon: Merrie Roof, MD;  Location: [facility];  Service: General;  Laterality: Right;  . Irrigation and debridement abscess Right 02/18/2014    Procedure: IRRIGATION AND DEBRIDEMENT RIGHT AXILLARY ABSCESS;  Surgeon: Leighton Ruff, MD;  Location: [facility];  Service: General;  Laterality: Right;    Family History  Problem Relation Age of Onset  . Diabetes Mother   . Diabetes    . Heart attack Neg Hx   . Colon cancer Neg Hx   . Breast cancer Neg Hx   . Esophageal cancer Neg Hx   . Rectal cancer Neg Hx   . Stomach cancer Neg Hx   . Cancer Father     melanoma  . Diabetes Brother   . Other Brother     amputation    Allergies  Allergen Reactions  . Metformin Diarrhea  . Pioglitazone Other (See Comments)    REACTION: wt gain  . Vancomycin     Erythematous rash, hands and toes became cyanotic.      Current Outpatient Prescriptions on File Prior to Visit  Medication Sig Dispense  Refill  . amLODipine (NORVASC) 10 MG tablet Take 1 tablet (10 mg total) by mouth daily.  30 tablet  3  . anastrozole (ARIMIDEX) 1 MG tablet Take 1 mg by mouth daily.      . B Complex-C (SUPER B COMPLEX PO) Take 1 tablet by mouth daily.      . Blood Glucose Monitoring Suppl (FREESTYLE LITE) DEVI       . Calcium Carbonate-Vitamin D (CALCIUM-VITAMIN D) 600-200 MG-UNIT CAPS Take 1 tablet by mouth daily.       Marland Kitchen doxycycline (VIBRAMYCIN) 100 MG capsule Take 1 capsule (100 mg total) by mouth 2 (two) times daily.  40 capsule  0  . FREESTYLE LITE test strip Use to check blood sugar 4 times a day.      . Glucosamine-Chondroitin (GLUCOSAMINE CHONDR COMPLEX PO) Take 1 capsule by mouth daily.      . insulin glargine (LANTUS) 100 UNIT/ML injection Inject 0.4 mLs (40 Units total) into the skin at bedtime.  3 mL  0  . insulin regular (NOVOLIN R,HUMULIN R) 100 units/mL injection Inject into the skin 3 (three) times daily before meals. Sliding scale 16-30  units      . lisinopril (PRINIVIL,ZESTRIL) 40 MG tablet Take 40 mg by mouth daily.      . metoprolol succinate (TOPROL-XL) 50 MG 24 hr tablet TAKE 1 TABLET BY MOUTH DAILY  30 tablet  0  . nystatin ointment (MYCOSTATIN) Apply 1 application topically 2 (two) times daily as needed (rash).      Glory Rosebush DELICA LANCETS FINE MISC Use to check blood sugar 4 times daily.      . Probiotic Product (PROBIOTIC DAILY PO) Take 1 capsule by mouth 2 (two) times daily.       . traMADol (ULTRAM) 50 MG tablet Take 1-2 tablets (50-100 mg total) by mouth every 6 (six) hours as needed.  40 tablet  0  . warfarin (COUMADIN) 5 MG tablet Take 2.5-5 mg by mouth daily. 2.5 mg tablet on Thursday and Friday and all other days 5mg       . Wound Cleansers (RADIAPLEX  EX) Apply topically.       Current Facility-Administered Medications on File Prior to Visit  Medication Dose Route Frequency Provider Last Rate Last Dose  . silver sulfADIAZINE (SILVADENE) 1 % cream   Topical Daily Lora Paula, MD        BP 120/60  Pulse 67  Temp(Src) 98.1 F (36.7 C) (Oral)  Resp 16  Ht 5' 6.5" (1.689 m)  Wt 205 lb 1.3 oz (93.024 kg)  BMI 32.61 kg/m2  SpO2 99%       Objective:   Physical Exam  Constitutional: She appears well-developed and well-nourished. No distress.  Skin:  R axillary wound is clean, dry.  approx 1/4 -1/2 inch deep. No significant surrounding erythema  Psychiatric: She has a normal mood and affect. Her behavior is normal. Judgment and thought content normal.          Assessment & Plan:

## 2014-04-05 ENCOUNTER — Other Ambulatory Visit (INDEPENDENT_AMBULATORY_CARE_PROVIDER_SITE_OTHER): Payer: Self-pay | Admitting: General Surgery

## 2014-04-06 ENCOUNTER — Other Ambulatory Visit: Payer: Self-pay | Admitting: Family

## 2014-04-06 NOTE — Telephone Encounter (Signed)
Rx request to pharmacy/[facility]  

## 2014-04-07 ENCOUNTER — Encounter (HOSPITAL_BASED_OUTPATIENT_CLINIC_OR_DEPARTMENT_OTHER): Payer: Self-pay | Admitting: Emergency Medicine

## 2014-04-07 ENCOUNTER — Emergency Department (HOSPITAL_BASED_OUTPATIENT_CLINIC_OR_DEPARTMENT_OTHER)
Admission: EM | Admit: 2014-04-07 | Discharge: 2014-04-07 | Disposition: A | Discharge status: Home / Self Care | Payer: Medicare Other | Attending: Emergency Medicine | Admitting: Emergency Medicine

## 2014-04-07 DIAGNOSIS — Z860100 Personal history of colon polyps, unspecified: Secondary | ICD-10-CM | POA: Insufficient documentation

## 2014-04-07 DIAGNOSIS — Z79899 Other long term (current) drug therapy: Secondary | ICD-10-CM | POA: Diagnosis not present

## 2014-04-07 DIAGNOSIS — Z792 Long term (current) use of antibiotics: Secondary | ICD-10-CM | POA: Diagnosis not present

## 2014-04-07 DIAGNOSIS — T148XXA Other injury of unspecified body region, initial encounter: Secondary | ICD-10-CM

## 2014-04-07 DIAGNOSIS — Z8669 Personal history of other diseases of the nervous system and sense organs: Secondary | ICD-10-CM | POA: Diagnosis not present

## 2014-04-07 DIAGNOSIS — Z794 Long term (current) use of insulin: Secondary | ICD-10-CM | POA: Diagnosis not present

## 2014-04-07 DIAGNOSIS — S60559A Superficial foreign body of unspecified hand, initial encounter: Secondary | ICD-10-CM | POA: Diagnosis not present

## 2014-04-07 DIAGNOSIS — Z85828 Personal history of other malignant neoplasm of skin: Secondary | ICD-10-CM | POA: Insufficient documentation

## 2014-04-07 DIAGNOSIS — Z7901 Long term (current) use of anticoagulants: Secondary | ICD-10-CM | POA: Diagnosis not present

## 2014-04-07 DIAGNOSIS — Y9389 Activity, other specified: Secondary | ICD-10-CM | POA: Diagnosis not present

## 2014-04-07 DIAGNOSIS — Z8739 Personal history of other diseases of the musculoskeletal system and connective tissue: Secondary | ICD-10-CM | POA: Insufficient documentation

## 2014-04-07 DIAGNOSIS — I10 Essential (primary) hypertension: Secondary | ICD-10-CM | POA: Diagnosis not present

## 2014-04-07 DIAGNOSIS — Z8619 Personal history of other infectious and parasitic diseases: Secondary | ICD-10-CM | POA: Diagnosis not present

## 2014-04-07 DIAGNOSIS — Y9229 Other specified public building as the place of occurrence of the external cause: Secondary | ICD-10-CM | POA: Diagnosis not present

## 2014-04-07 DIAGNOSIS — W268XXA Contact with other sharp object(s), not elsewhere classified, initial encounter: Secondary | ICD-10-CM | POA: Diagnosis not present

## 2014-04-07 DIAGNOSIS — Z853 Personal history of malignant neoplasm of breast: Secondary | ICD-10-CM | POA: Diagnosis not present

## 2014-04-07 DIAGNOSIS — Z86711 Personal history of pulmonary embolism: Secondary | ICD-10-CM | POA: Diagnosis not present

## 2014-04-07 DIAGNOSIS — Z862 Personal history of diseases of the blood and blood-forming organs and certain disorders involving the immune mechanism: Secondary | ICD-10-CM | POA: Insufficient documentation

## 2014-04-07 DIAGNOSIS — Z8719 Personal history of other diseases of the digestive system: Secondary | ICD-10-CM | POA: Diagnosis not present

## 2014-04-07 DIAGNOSIS — Z86718 Personal history of other venous thrombosis and embolism: Secondary | ICD-10-CM | POA: Diagnosis not present

## 2014-04-07 DIAGNOSIS — Z85820 Personal history of malignant melanoma of skin: Secondary | ICD-10-CM | POA: Insufficient documentation

## 2014-04-07 DIAGNOSIS — E119 Type 2 diabetes mellitus without complications: Secondary | ICD-10-CM | POA: Insufficient documentation

## 2014-04-07 NOTE — ED Notes (Signed)
Pt. Reports she felt something sticking in the L pinky finger.  Nothing seen during triage.

## 2014-04-07 NOTE — Discharge Instructions (Signed)
Sliver Removal °You have had a sliver (splinter) removed. This has caused a wound that extends through some or all layers of the skin and possibly into the subcutaneous tissue. This is the tissue just beneath the skin. Because these wounds can not be cleaned well, it is necessary to watch closely for infection. °AFTER THE PROCEDURE  °If a cut (incision) was necessary to remove this, it may have been repaired for you by your caregiver either with suturing, stapling, or adhesive strips. These keep together the skin edges and allow better and faster healing. °HOME CARE INSTRUCTIONS  °· A dressing may have been applied. This may be changed once per day or as instructed. If the dressing sticks, it may be soaked off with a gauze pad or clean cloth that has been dampened with soapy water or hydrogen peroxide. °· It is difficult to remove all slivers or foreign bodies as they may break or splinter into smaller pieces. Be aware that your body will work to remove the foreign substance. That is, the foreign body may work itself out of the wound. That is normal. °· Watch for signs of infection and notify your caregiver if you suspect a sliver or foreign body remains in the wound. °· You may have received a recommendation to follow up with your physician or a specialist. It is very important to call for or keep follow-up appointments in order to avoid infection or other complications. °· Only take over-the-counter or prescription medicines for pain, discomfort, or fever as directed by your caregiver. °· If antibiotics were prescribed, be sure to finish all of the medicine. °If you did not receive a tetanus shot today because you did not recall when your last one was given, check with your caregiver in the next day or two during follow up to determine if one is needed. °SEEK MEDICAL CARE IF:  °· The area around the wound has new or worsening redness or tenderness. °· Pus is coming from the wound °· There is a foul smell from the  wound or dressing °· The edges of a wound that had been repaired break open °SEEK IMMEDIATE MEDICAL CARE IF:  °· Red streaks are coming from the wound °· An unexplained oral temperature above 102° F (38.9° C) develops. °Document Released: 10/31/2000 Document Revised: 01/26/2012 Document Reviewed: 06/19/2008 °[employer]® Patient Information ©2014 [employer]. ° °

## 2014-04-07 NOTE — ED Provider Notes (Signed)
CSN: [id number]     Arrival date & time 04/07/14  1914 History   First MD Initiated Contact with Patient 04/07/14 1931     Chief Complaint  Patient presents with  . Hand Pain     (Consider location/radiation/quality/duration/timing/severity/associated sxs/prior Treatment) HPI Comments: Patient presents with a splinter to her hand. She was at a [employer] and felt something poke her from the boot that she was sitting in. She noted a splinter to her pinky finger. She denies any other injuries. While the nurse was triaged in her, the nurse was able to remove the splinter in its entirety. Patient says her symptoms are completely gone at this point. She doesn't feel any foreign body sensations. Her tetanus shot is up-to-date.   Past Medical History  Diagnosis Date  . Hyperplastic colon polyp   . Diabetes mellitus type II   . Osteoarthritis   . GERD (gastroesophageal reflux disease)   . Osteopenia   . Lupus     of skin see dermatologist frequently  . OA (osteoarthritis of spine)     C-spine  . DVT (deep venous thrombosis)     right leg DVT 05-17-2008  . Leg swelling     left leg 2-10, u/s showed a old clot, has a hypercoag panel,  . History of cardiovascular stress test     10/09 had CP, stress test neg, likely GI (GERD)-09/2006-had CP: stress test (-)  . 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 DVT (deep vein thrombosis) in pregnancy   . DVT of leg (deep venous thrombosis) 07/28/2011  . 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 Allergy   . Clotting disorder     hx of PE and DVT  . C. difficile diarrhea     hx of,   . Skin cancer     skin CA  . Left leg pain 03/12/2013  . Otitis externa 03/19/2013    right  . PONV (postoperative nausea and vomiting)   . Hypertension     pcp  dr Conley Canal   h.p   lebaur  . Breast cancer     left ductal breast ca dx 2003 approx, s/p XRt x 6 weeks, released from oncology (per  patient); dx right breast ca 2014   Past Surgical History   Procedure Laterality Date  . Cholecystectomy    . Breast biopsy      left breast and right breast  . Breast lumpectomy      left breast-ductal carcinoma in situ  . Appendectomy    . Skin cancer excision      from back   . Breast lumpectomy with needle localization and axillary sentinel lymph node bx Right 06/17/2013    Procedure: BREAST LUMPECTOMY WITH NEEDLE LOCALIZATION AND AXILLARY SENTINEL LYMPH NODE BX;  Surgeon: Merrie Roof, MD;  Location: [facility];  Service: General;  Laterality: Right;  . Irrigation and debridement abscess Right 02/18/2014    Procedure: IRRIGATION AND DEBRIDEMENT RIGHT AXILLARY ABSCESS;  Surgeon: Leighton Ruff, MD;  Location: [facility];  Service: General;  Laterality: Right;   Family History  Problem Relation Age of Onset  . Diabetes Mother   . Diabetes    . Heart attack Neg Hx   . Colon cancer Neg Hx   . Breast cancer Neg Hx   . Esophageal cancer Neg Hx   . Rectal cancer Neg Hx   . Stomach cancer Neg Hx   . Cancer Father     melanoma  .  Diabetes Brother   . Other Brother     amputation   History  Substance Use Topics  . Smoking status: Never Smoker   . Smokeless tobacco: Never Used  . Alcohol Use: No   OB History   Grav Para Term Preterm Abortions TAB SAB Ect Mult Living                 Review of Systems  Constitutional: Negative for fever.  Musculoskeletal: Positive for myalgias.  Skin: Positive for wound.  Neurological: Negative for weakness and numbness.      Allergies  Metformin; Pioglitazone; and Vancomycin  Home Medications   Prior to Admission medications   Medication Sig Start Date End Date Taking? Authorizing Provider  amLODipine (NORVASC) 10 MG tablet Take 1 tablet (10 mg total) by mouth daily. 03/03/14   Debbrah Alar, NP  anastrozole (ARIMIDEX) 1 MG tablet Take 1 mg by mouth daily.    Historical Provider, MD  B Complex-C (SUPER B COMPLEX PO) Take 1 tablet by mouth daily.    Historical Provider, MD  Blood Glucose  Monitoring Suppl (FREESTYLE LITE) DEVI  07/04/13   Historical Provider, MD  Calcium Carbonate-Vitamin D (CALCIUM-VITAMIN D) 600-200 MG-UNIT CAPS Take 1 tablet by mouth daily.     Historical Provider, MD  doxycycline (VIBRAMYCIN) 100 MG capsule Take 1 capsule (100 mg total) by mouth 2 (two) times daily. 03/17/14   Luella Cook III, MD  fesoterodine (TOVIAZ) 4 MG TB24 tablet Take 4 mg by mouth daily.    Historical Provider, MD  FREESTYLE LITE test strip Use to check blood sugar 4 times a day. 10/20/13   Historical Provider, MD  Glucosamine-Chondroitin (GLUCOSAMINE CHONDR COMPLEX PO) Take 1 capsule by mouth daily.    Historical Provider, MD  insulin glargine (LANTUS) 100 UNIT/ML injection Inject 0.4 mLs (40 Units total) into the skin at bedtime. 11/28/13   Debbrah Alar, NP  insulin regular (NOVOLIN R,HUMULIN R) 100 units/mL injection Inject into the skin 3 (three) times daily before meals. Sliding scale 16-30  units    Historical Provider, MD  lisinopril (PRINIVIL,ZESTRIL) 40 MG tablet Take 40 mg by mouth daily.    Historical Provider, MD  metoprolol succinate (TOPROL-XL) 50 MG 24 hr tablet TAKE 1 TABLET BY MOUTH DAILY 04/06/14   Debbrah Alar, NP  nystatin ointment (MYCOSTATIN) Apply 1 application topically 2 (two) times daily as needed (rash).    Historical Provider, MD  [facility] DELICA LANCETS FINE MISC Use to check blood sugar 4 times daily. 11/29/13   Historical Provider, MD  Probiotic Product (PROBIOTIC DAILY PO) Take 1 capsule by mouth 2 (two) times daily.     Historical Provider, MD  traMADol (ULTRAM) 50 MG tablet Take 1-2 tablets (50-100 mg total) by mouth every 6 (six) hours as needed. 02/20/14   Megan Dort, PA-C  warfarin (COUMADIN) 5 MG tablet Take 2.5-5 mg by mouth daily. 2.5 mg tablet on Thursday and Friday and all other days 5mg  08/06/11   Debbrah Alar, NP  Wound Cleansers (RADIAPLEX EX) Apply topically.    Historical Provider, MD   BP 160/71  Pulse 80  Temp(Src) 98.1 F (36.7  C) (Oral)  Resp 22  Ht 5' 6.5" (1.689 m)  Wt 202 lb (91.627 kg)  BMI 32.12 kg/m2  SpO2 100% Physical Exam  Constitutional: She appears well-developed and well-nourished.  Cardiovascular: Normal rate.   Pulmonary/Chest: Effort normal.  Musculoskeletal:  . Small puncture wound to the left pinky finger. I don't see or  feel any foreign bodies. She has no bleeding to the area. No inflammation around the area. No bony tenderness. She has normal sensation and motor function in the finger.  Skin: Skin is warm and dry.    ED Course  Procedures (including critical care time) Labs Review Labs Reviewed - No data to display  Imaging Review No results found.   EKG Interpretation None      MDM   Final diagnoses:  Splinter    Patient's wound was cleaned and dressed. She was given wound care instructions advised to return if she has any worsening pain swelling redness or drainage.    Malvin Johns, MD 04/07/14 2022

## 2014-04-11 ENCOUNTER — Telehealth: Payer: Self-pay | Admitting: Family

## 2014-04-11 MED ORDER — METOPROLOL SUCCINATE ER 50 MG PO TB24: ORAL_TABLET | ORAL | Status: DC

## 2014-04-11 MED ORDER — LISINOPRIL 40 MG PO TABS: 40.0000 mg | ORAL_TABLET | Freq: Every day | ORAL | Status: DC

## 2014-04-11 NOTE — Telephone Encounter (Signed)
Patient states that she needs refills of norvasc,lisinopril, and another bp medication(does not know name). She states that these are all AM pills.   Also, patient wanted to let Lenna Sciara know that she finished the antibiotics last Saturday

## 2014-04-11 NOTE — Telephone Encounter (Signed)
Amlodipine refill was sent on 03/03/14, #30 x 3 refills. Pt should contact pharmacy for additional refills. Refills sent for lisinopril and metoprolol. Notified pt.

## 2014-04-17 ENCOUNTER — Ambulatory Visit (INDEPENDENT_AMBULATORY_CARE_PROVIDER_SITE_OTHER): Payer: Medicare Other | Admitting: General Surgery

## 2014-04-17 ENCOUNTER — Encounter (INDEPENDENT_AMBULATORY_CARE_PROVIDER_SITE_OTHER): Payer: Self-pay | Admitting: General Surgery

## 2014-04-17 VITALS — BP 132/82 | HR 71 | Temp 98.4°F | Ht 66.0 in | Wt 208.0 lb

## 2014-04-17 DIAGNOSIS — C50219 Malignant neoplasm of upper-inner quadrant of unspecified female breast: Secondary | ICD-10-CM

## 2014-04-17 DIAGNOSIS — C50211 Malignant neoplasm of upper-inner quadrant of right female breast: Secondary | ICD-10-CM

## 2014-04-17 NOTE — Progress Notes (Signed)
Subjective:     Patient ID: [name], female   DOB: 06/12/1941, 73 y.o.   MRN: [id number]  HPI The patient is a 73 year old white female who is 10 months status post right lumpectomy and negative sentinel node biopsy for a T1 A. N0 right breast cancer. Her course was complicated by development of an axillary abscess during radiation therapy. This required incision and drainage. She has been keeping the area clean and dry. She feels as though it is almost healed. She continues to feel tired a lot of the time. She denies any significant pain in the right breast.  Review of Systems  Constitutional: Positive for fatigue.  HENT: Negative.   Eyes: Negative.   Respiratory: Negative.   Cardiovascular: Negative.   Gastrointestinal: Negative.   Endocrine: Negative.   Genitourinary: Negative.   Musculoskeletal: Negative.   Skin: Negative.   Allergic/Immunologic: Negative.   Neurological: Negative.   Hematological: Negative.   Psychiatric/Behavioral: Negative.        Objective:   Physical Exam  Constitutional: She is oriented to person, place, and time. She appears well-developed and well-nourished.  HENT:  Head: Normocephalic and atraumatic.  Eyes: Conjunctivae and EOM are normal. Pupils are equal, round, and reactive to light.  Neck: Normal range of motion. Neck supple.  Cardiovascular: Normal rate, regular rhythm and normal heart sounds.   Pulmonary/Chest: Effort normal and breath sounds normal.  The right breast is thicker secondary to radiation change. Otherwise there is no palpable mass in either breast. There is no palpable axillary, supraclavicular, or cervical lymphadenopathy. The wound in the right axilla appears to be healed  Abdominal: Soft. Bowel sounds are normal.  Musculoskeletal: Normal range of motion.  Lymphadenopathy:    She has no cervical adenopathy.  Neurological: She is alert and oriented to person, place, and time.  Skin: Skin is warm and dry.  Psychiatric:  She has a normal mood and affect. Her behavior is normal.       Assessment:     The patient is 10 months status post right lumpectomy for breast cancer     Plan:     At this point she will continue to do regular self exams. She will continue to take anastrozole. I will plan to see her back in about 3 months.

## 2014-04-17 NOTE — Patient Instructions (Signed)
Continue regular self exams Continue arimidex

## 2014-05-09 ENCOUNTER — Telehealth: Payer: Self-pay | Admitting: Family

## 2014-05-09 ENCOUNTER — Ambulatory Visit (INDEPENDENT_AMBULATORY_CARE_PROVIDER_SITE_OTHER): Payer: Medicare Other | Admitting: Family

## 2014-05-09 ENCOUNTER — Encounter: Payer: Self-pay | Admitting: Family

## 2014-05-09 VITALS — BP 136/64 | HR 69 | Temp 97.6°F | Ht 66.5 in | Wt 206.0 lb

## 2014-05-09 DIAGNOSIS — R5381 Other malaise: Secondary | ICD-10-CM

## 2014-05-09 DIAGNOSIS — R5383 Other fatigue: Secondary | ICD-10-CM | POA: Insufficient documentation

## 2014-05-09 DIAGNOSIS — L304 Erythema intertrigo: Secondary | ICD-10-CM

## 2014-05-09 DIAGNOSIS — IMO0001 Reserved for inherently not codable concepts without codable children: Secondary | ICD-10-CM

## 2014-05-09 DIAGNOSIS — M5430 Sciatica, unspecified side: Secondary | ICD-10-CM

## 2014-05-09 DIAGNOSIS — G4733 Obstructive sleep apnea (adult) (pediatric): Secondary | ICD-10-CM

## 2014-05-09 DIAGNOSIS — L538 Other specified erythematous conditions: Secondary | ICD-10-CM

## 2014-05-09 DIAGNOSIS — E119 Type 2 diabetes mellitus without complications: Secondary | ICD-10-CM

## 2014-05-09 DIAGNOSIS — M545 Low back pain, unspecified: Secondary | ICD-10-CM | POA: Insufficient documentation

## 2014-05-09 DIAGNOSIS — M5440 Lumbago with sciatica, unspecified side: Secondary | ICD-10-CM

## 2014-05-09 DIAGNOSIS — I10 Essential (primary) hypertension: Secondary | ICD-10-CM

## 2014-05-09 DIAGNOSIS — E1165 Type 2 diabetes mellitus with hyperglycemia: Secondary | ICD-10-CM

## 2014-05-09 DIAGNOSIS — R5382 Chronic fatigue, unspecified: Secondary | ICD-10-CM

## 2014-05-09 LAB — HEMOGLOBIN A1C
HEMOGLOBIN A1C: 10.2 % — AB (ref <5.7)
Mean Plasma Glucose: 246 mg/dL — ABNORMAL HIGH (ref <117)

## 2014-05-09 MED ORDER — FLUCONAZOLE 150 MG PO TABS: 150.0000 mg | ORAL_TABLET | Freq: Once | ORAL | Status: DC

## 2014-05-09 NOTE — Patient Instructions (Addendum)
You may use tylenol 1000mg  every 8 hours as needed. OK to take with tramadol if pain is severe.   Complete lab work prior to leaving. Take diflucan for skin rash- once. Call if symptoms worsen  Or do not improve.  Follow up in 3 months.

## 2014-05-09 NOTE — Assessment & Plan Note (Signed)
Will rx with Diflucan.  Needs close monitoring of coumadin.

## 2014-05-09 NOTE — Assessment & Plan Note (Signed)
Pt has hx of OSA and tells me that she has had sleep study and has seen Dr. Elsworth Soho in the past. Reports that she is intolerant to full face mask.  This could be cause for her fatigue. Will refer back to Dr. Elsworth Soho.  Perhaps she could better tolerate another type of mask (nasal). Will defer to Dr. Elsworth Soho.

## 2014-05-09 NOTE — Telephone Encounter (Signed)
Could you please notify pt's coumadin clinic that we gave her a dose of diflucan 150 mg today. She is followed by coumadin clinic which is affiliated with [facility].  I believe these are their numbers:   Phone: [phone] or [phone]

## 2014-05-09 NOTE — Progress Notes (Signed)
Subjective:    Patient ID: [name], female    DOB: 18-Aug-1941, 73 y.o.   MRN: [id number]  HPI  [name] is a 73 yr old female who presents today for follow up.   DM2- sugars have been "not too good." was 177 this AM. She is followed by Dr. Posey Pronto.   HTN-  On lisinopril, amlodipine, metoprolol.   BP Readings from Last 3 Encounters:  05/09/14 136/64  04/17/14 132/82  04/07/14 160/71   Report pain in the lower back, radiates down to both knees, trouble walking due to the pain.  Fatigue.  She reports    Review of Systems See HPI  Past Medical History  Diagnosis Date  . Hyperplastic colon polyp   . Diabetes mellitus type II   . Osteoarthritis   . GERD (gastroesophageal reflux disease)   . Osteopenia   . Lupus     of skin see dermatologist frequently  . OA (osteoarthritis of spine)     C-spine  . DVT (deep venous thrombosis)     right leg DVT 05-17-2008  . Leg swelling     left leg 2-10, u/s showed a old clot, has a hypercoag panel,  . History of cardiovascular stress test     10/09 had CP, stress test neg, likely GI (GERD)-09/2006-had CP: stress test (-)  . 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 DVT (deep vein thrombosis) in pregnancy   . DVT of leg (deep venous thrombosis) 07/28/2011  . 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 Allergy   . Clotting disorder     hx of PE and DVT  . C. difficile diarrhea     hx of,   . Skin cancer     skin CA  . Left leg pain 03/12/2013  . Otitis externa 03/19/2013    right  . PONV (postoperative nausea and vomiting)   . Hypertension     pcp  dr Conley Canal   h.p   lebaur  . 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 Marital Status: Widowed    Spouse Name: N/A    Number of Children: 3  . Years of Education: N/A   Occupational History  . Retired    Social History Main Topics  . Smoking  status: Never Smoker   . Smokeless tobacco: Never Used  . Alcohol Use: No  . Drug Use: No  . Sexual Activity: Not Currently   Other Topics Concern  . Not on file   Social History Narrative   Single-widow    3 children , 1 in [place] (son, daughter - [place])    [place] to [place] 2002 from [place], [place]   Alcohol Use - no      tobacco-- never     Illicit Drug Use - no    Pt is Jehovah's Witness-No blood products   Daughter - Hydie Langan           Past Surgical History  Procedure Laterality Date  . Cholecystectomy    . Breast biopsy      left breast and right breast  . Breast lumpectomy      left breast-ductal carcinoma in situ  . Appendectomy    . Skin cancer excision      from back   . Breast  lumpectomy with needle localization and axillary sentinel lymph node bx Right 06/17/2013    Procedure: BREAST LUMPECTOMY WITH NEEDLE LOCALIZATION AND AXILLARY SENTINEL LYMPH NODE BX;  Surgeon: Merrie Roof, MD;  Location: [facility];  Service: General;  Laterality: Right;  . Irrigation and debridement abscess Right 02/18/2014    Procedure: IRRIGATION AND DEBRIDEMENT RIGHT AXILLARY ABSCESS;  Surgeon: Leighton Ruff, MD;  Location: [facility];  Service: General;  Laterality: Right;    Family History  Problem Relation Age of Onset  . Diabetes Mother   . Diabetes    . Heart attack Neg Hx   . Colon cancer Neg Hx   . Breast cancer Neg Hx   . Esophageal cancer Neg Hx   . Rectal cancer Neg Hx   . Stomach cancer Neg Hx   . Cancer Father     melanoma  . Diabetes Brother   . Other Brother     amputation    Allergies  Allergen Reactions  . Metformin Diarrhea  . Pioglitazone Other (See Comments)    REACTION: wt gain  . Vancomycin     Erythematous rash, hands and toes became cyanotic.      Current Outpatient Prescriptions on File Prior to Visit  Medication Sig Dispense Refill  . amLODipine (NORVASC) 10 MG tablet Take 1 tablet (10 mg total) by mouth daily.  30 tablet  3  . anastrozole  (ARIMIDEX) 1 MG tablet Take 1 mg by mouth daily.      . B Complex-C (SUPER B COMPLEX PO) Take 1 tablet by mouth daily.      . Blood Glucose Monitoring Suppl (FREESTYLE LITE) DEVI       . Calcium Carbonate-Vitamin D (CALCIUM-VITAMIN D) 600-200 MG-UNIT CAPS Take 1 tablet by mouth daily.       Marland Kitchen doxycycline (VIBRAMYCIN) 100 MG capsule Take 1 capsule (100 mg total) by mouth 2 (two) times daily.  40 capsule  0  . fesoterodine (TOVIAZ) 4 MG TB24 tablet Take 4 mg by mouth daily.      Marland Kitchen FREESTYLE LITE test strip Use to check blood sugar 4 times a day.      . Glucosamine-Chondroitin (GLUCOSAMINE CHONDR COMPLEX PO) Take 1 capsule by mouth daily.      . insulin glargine (LANTUS) 100 UNIT/ML injection Inject 0.4 mLs (40 Units total) into the skin at bedtime.  3 mL  0  . insulin regular (NOVOLIN R,HUMULIN R) 100 units/mL injection Inject into the skin 3 (three) times daily before meals. Sliding scale 16-30  units      . lisinopril (PRINIVIL,ZESTRIL) 40 MG tablet Take 1 tablet (40 mg total) by mouth daily.  30 tablet  2  . metoprolol succinate (TOPROL-XL) 50 MG 24 hr tablet TAKE 1 TABLET BY MOUTH DAILY  30 tablet  2  . nystatin ointment (MYCOSTATIN) Apply 1 application topically 2 (two) times daily as needed (rash).      Glory Rosebush DELICA LANCETS FINE MISC Use to check blood sugar 4 times daily.      . Probiotic Product (PROBIOTIC DAILY PO) Take 1 capsule by mouth 2 (two) times daily.       . traMADol (ULTRAM) 50 MG tablet Take 1-2 tablets (50-100 mg total) by mouth every 6 (six) hours as needed.  40 tablet  0  . warfarin (COUMADIN) 5 MG tablet Take 2.5-5 mg by mouth daily. 2.5 mg tablet on Thursday and Friday and all other days 5mg       .  Wound Cleansers (RADIAPLEX EX) Apply topically.       Current Facility-Administered Medications on File Prior to Visit  Medication Dose Route Frequency Provider Last Rate Last Dose  . silver sulfADIAZINE (SILVADENE) 1 % cream   Topical Daily Lora Paula, MD         BP 136/64  Pulse 69  Temp(Src) 97.6 F (36.4 C) (Oral)  Ht 5' 6.5" (1.689 m)  Wt 206 lb (93.441 kg)  BMI 32.76 kg/m2  SpO2 94%       Objective:   Physical Exam  Constitutional: She is oriented to person, place, and time. She appears well-developed and well-nourished. No distress.  Cardiovascular: Normal rate and regular rhythm.   No murmur heard. Pulmonary/Chest: Effort normal and breath sounds normal. No respiratory distress. She has no wheezes. She has no rales. She exhibits no tenderness.  Musculoskeletal:  Bilateral LE strength is 5/5  Neurological: She is alert and oriented to person, place, and time.  Reflex Scores:      Patellar reflexes are 2+ on the right side and 2+ on the left side. Skin:  Resolution of right axillary abscess. Fungal rash noted bilateral groin and mild at top of gluteal cleft  Psychiatric: She has a normal mood and affect. Her behavior is normal. Judgment and thought content normal.          Assessment & Plan:

## 2014-05-09 NOTE — Assessment & Plan Note (Signed)
Cannot rx with nsaid due to coumadin. Would also avoid steroid in setting of uncontrolled DM. Advised pt to tylenol and tramadol prn pain.

## 2014-05-09 NOTE — Progress Notes (Signed)
Pre visit review using our clinic review tool, if applicable. No additional management support is needed unless otherwise documented below in the visit note. 

## 2014-05-09 NOTE — Assessment & Plan Note (Signed)
BP is stable on current meds. Continue same. Obtain bmet.

## 2014-05-09 NOTE — Assessment & Plan Note (Signed)
Uncontrolled.  Follow up with endo as scheduled.

## 2014-05-10 ENCOUNTER — Encounter: Payer: Self-pay | Admitting: Family

## 2014-05-10 ENCOUNTER — Telehealth: Payer: Self-pay | Admitting: Family

## 2014-05-10 LAB — BASIC METABOLIC PANEL
BUN: 16 mg/dL (ref 6–23)
CALCIUM: 9.7 mg/dL (ref 8.4–10.5)
CO2: 26 mEq/L (ref 19–32)
CREATININE: 0.75 mg/dL (ref 0.50–1.10)
Chloride: 102 mEq/L (ref 96–112)
GLUCOSE: 293 mg/dL — AB (ref 70–99)
Potassium: 4.5 mEq/L (ref 3.5–5.3)
Sodium: 138 mEq/L (ref 135–145)

## 2014-05-10 NOTE — Telephone Encounter (Signed)
Patient notified of results. Patient expressed understanding.  

## 2014-05-10 NOTE — Telephone Encounter (Signed)
  Kidney function, electrolytes are normal.  Sugar is uncontrolled. A1C is up to 10.2 from 8.3.   Please arrange follow up with endocrinology.

## 2014-05-10 NOTE — Telephone Encounter (Signed)
LMOVM for pt to return call 

## 2014-05-10 NOTE — Telephone Encounter (Signed)
Called coumadin clinic and reported injection.

## 2014-05-15 ENCOUNTER — Telehealth: Payer: Self-pay | Admitting: *Deleted

## 2014-05-15 MED ORDER — CLOTRIMAZOLE-BETAMETHASONE 1-0.05 % EX CREA: 1.0000 "application " | TOPICAL_CREAM | Freq: Two times a day (BID) | CUTANEOUS | Status: DC

## 2014-05-15 NOTE — Telephone Encounter (Signed)
Try lotrisone cream.  If not improved in 1 week, let me know and we will initiate dermatology referral.

## 2014-05-15 NOTE — Telephone Encounter (Signed)
Pt left message that she has been out of norvasc and metoprolol x 2 days. Spoke with pharmacist Jenny Reichmann) and he states norvasc was filled 2 days ago and metoprolol still has refills remaining on file. I asked to fill metoprolol for pt as well. Pt also states that the rash on her legs is no better after recent diflucan dose and wants to know what else she should do? Pt states it is ok to leave message on voicemail as she will be out until 12pm.

## 2014-05-15 NOTE — Telephone Encounter (Signed)
Left detailed message on voicemail re: below instructions and to call if any questions. 

## 2014-05-26 ENCOUNTER — Telehealth: Payer: Self-pay | Admitting: Family

## 2014-05-26 NOTE — Telephone Encounter (Signed)
Patient states that she ordered Omega-XL from a TV commercial and wants to know if it would be ok for her to take this?

## 2014-05-26 NOTE — Telephone Encounter (Signed)
Please advise 

## 2014-06-05 ENCOUNTER — Telehealth: Payer: Self-pay | Admitting: *Deleted

## 2014-06-05 ENCOUNTER — Other Ambulatory Visit (HOSPITAL_BASED_OUTPATIENT_CLINIC_OR_DEPARTMENT_OTHER): Payer: Medicare Other

## 2014-06-05 ENCOUNTER — Ambulatory Visit (INDEPENDENT_AMBULATORY_CARE_PROVIDER_SITE_OTHER): Payer: Medicare Other | Admitting: Family

## 2014-06-05 ENCOUNTER — Ambulatory Visit (HOSPITAL_BASED_OUTPATIENT_CLINIC_OR_DEPARTMENT_OTHER)
Admission: RE | Admit: 2014-06-05 | Discharge: 2014-06-05 | Disposition: A | Discharge status: Home / Self Care | Payer: Medicare Other | Source: Ambulatory Visit | Attending: Family | Admitting: Family

## 2014-06-05 ENCOUNTER — Encounter: Payer: Self-pay | Admitting: Family

## 2014-06-05 VITALS — BP 120/60 | HR 66 | Temp 97.4°F | Resp 16 | Ht 66.5 in | Wt 205.0 lb

## 2014-06-05 DIAGNOSIS — R600 Localized edema: Secondary | ICD-10-CM

## 2014-06-05 DIAGNOSIS — R609 Edema, unspecified: Secondary | ICD-10-CM

## 2014-06-05 DIAGNOSIS — M79605 Pain in left leg: Secondary | ICD-10-CM

## 2014-06-05 DIAGNOSIS — E1165 Type 2 diabetes mellitus with hyperglycemia: Secondary | ICD-10-CM

## 2014-06-05 DIAGNOSIS — M79609 Pain in unspecified limb: Secondary | ICD-10-CM

## 2014-06-05 DIAGNOSIS — Z86718 Personal history of other venous thrombosis and embolism: Secondary | ICD-10-CM | POA: Insufficient documentation

## 2014-06-05 DIAGNOSIS — M5440 Lumbago with sciatica, unspecified side: Secondary | ICD-10-CM

## 2014-06-05 DIAGNOSIS — M545 Low back pain, unspecified: Secondary | ICD-10-CM

## 2014-06-05 DIAGNOSIS — I10 Essential (primary) hypertension: Secondary | ICD-10-CM

## 2014-06-05 DIAGNOSIS — M5430 Sciatica, unspecified side: Secondary | ICD-10-CM

## 2014-06-05 DIAGNOSIS — G4733 Obstructive sleep apnea (adult) (pediatric): Secondary | ICD-10-CM

## 2014-06-05 DIAGNOSIS — M79606 Pain in leg, unspecified: Secondary | ICD-10-CM | POA: Insufficient documentation

## 2014-06-05 DIAGNOSIS — IMO0001 Reserved for inherently not codable concepts without codable children: Secondary | ICD-10-CM

## 2014-06-05 MED ORDER — FUROSEMIDE 20 MG PO TABS: 20.0000 mg | ORAL_TABLET | Freq: Every day | ORAL | Status: DC | PRN

## 2014-06-05 NOTE — Assessment & Plan Note (Signed)
Given hx of DVT, will obtain LLE doppler.

## 2014-06-05 NOTE — Assessment & Plan Note (Signed)
Has follow up with endo and I have advised her to keep this appointment.

## 2014-06-05 NOTE — Assessment & Plan Note (Signed)
Back pain has not improved since 05/09/14. It is in lower back and radiates bilaterally down side of legs to knees.Will do MRI.

## 2014-06-05 NOTE — Patient Instructions (Addendum)
Please keep upcoming apt with Dr. Posey Pronto. You will be contacted about your MRI.   Complete Left leg ultrasound on the first floor. You may use lasix once daily as needed. Follow up in 1 month.

## 2014-06-05 NOTE — Telephone Encounter (Signed)
Received call from radiology that lower extremity doppler is negative for DVT.  Please advise.

## 2014-06-05 NOTE — Telephone Encounter (Signed)
Please contact pt and let her know that her doppler is negative for DVT.

## 2014-06-05 NOTE — Assessment & Plan Note (Addendum)
Discussed importance of CPAP. Pt understands that untreated OSA is the most likely cause of her fatigue. Declines follow up with Dr. Elsworth Soho.

## 2014-06-05 NOTE — Progress Notes (Signed)
Subjective:    Patient ID: [name], female    DOB: 16-Aug-1941, 73 y.o.   MRN: [id number]  HPI [name] is a 73 yo female here for follow up.  OSA: pt states Dr. Elsworth Soho told her last year that she "has plenty of oxygen" . Dr. Bari Mantis note showed moderate OSA.  DM type 2:  Followed by Dr. Posey Pronto and will see him next week. Blood sugars are running 150-200s. Lab Results  Component Value Date   HGBA1C 10.2* 05/09/2014   HTN: She is maintained on amlodipine 10 mg, metoprolol 50 mg, and lisinopril 40 mg.   BP Readings from Last 3 Encounters:  06/05/14 120/60  05/09/14 136/64  04/17/14 132/82   Back pain: Back pain has not improved. It is in lower back and radiates bilaterally down side of legs to knees. Pain is 9/10 constantly. Acetaminophen and ultram relieves pain somewhat to 7/10. No incontinence of bladder or bowels.  Reports pain in the left posterior thigh.  Rash in groin is resolved.  Review of Systems  Constitutional: Positive for chills (Had chills on Sunday.). Negative for fever.  Respiratory: Positive for cough (Occasional.). Negative for shortness of breath.   Cardiovascular: Positive for leg swelling. Negative for chest pain and palpitations.   Past Medical History  Diagnosis Date  . Hyperplastic colon polyp   . Diabetes mellitus type II   . Osteoarthritis   . GERD (gastroesophageal reflux disease)   . Osteopenia   . Lupus     of skin see dermatologist frequently  . OA (osteoarthritis of spine)     C-spine  . DVT (deep venous thrombosis)     right leg DVT 05-17-2008  . Leg swelling     left leg 2-10, u/s showed a old clot, has a hypercoag panel,  . History of cardiovascular stress test     10 /09 had CP, stress test neg, likely GI (GERD)-09/2006-had CP: stress test (-)  . 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 DVT (deep vein thrombosis) in pregnancy   . DVT of leg (deep venous thrombosis) 07/28/2011  . 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  Allergy   . Clotting disorder     hx of PE and DVT  . C. difficile diarrhea     hx of,   . Skin cancer     skin CA  . Left leg pain 03/12/2013  . Otitis externa 03/19/2013    right  . PONV (postoperative nausea and vomiting)   . Hypertension     pcp  dr Conley Canal   h.p   [facility]  . 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 Marital Status: Widowed    Spouse Name: N/A    Number of Children: 3  . Years of Education: N/A   Occupational History  . Retired    Social History Main Topics  . Smoking status: Never Smoker   . Smokeless tobacco: Never Used  . Alcohol Use: No  . Drug Use: No  . Sexual Activity: Not Currently   Other Topics Concern  . Not on file   Social History Narrative   Single-widow    3 children , 1 in [place] (son, daughter - [place])    [place] from [place], [place]   Alcohol Use -  no      tobacco-- never     Illicit Drug Use - no    Pt is Jehovah's Witness-No blood products   Daughter - Lyberti Thrush           Past Surgical History  Procedure Laterality Date  . Cholecystectomy    . Breast biopsy      left breast and right breast  . Breast lumpectomy      left breast-ductal carcinoma in situ  . Appendectomy    . Skin cancer excision      from back   . Breast lumpectomy with needle localization and axillary sentinel lymph node bx Right 06/17/2013    Procedure: BREAST LUMPECTOMY WITH NEEDLE LOCALIZATION AND AXILLARY SENTINEL LYMPH NODE BX;  Surgeon: Merrie Roof, MD;  Location: [facility];  Service: General;  Laterality: Right;  . Irrigation and debridement abscess Right 02/18/2014    Procedure: IRRIGATION AND DEBRIDEMENT RIGHT AXILLARY ABSCESS;  Surgeon: Leighton Ruff, MD;  Location: [facility];  Service: General;  Laterality: Right;    Family History  Problem Relation Age of Onset  . Diabetes Mother   . Diabetes    .  Heart attack Neg Hx   . Colon cancer Neg Hx   . Breast cancer Neg Hx   . Esophageal cancer Neg Hx   . Rectal cancer Neg Hx   . Stomach cancer Neg Hx   . Cancer Father     melanoma  . Diabetes Brother   . Other Brother     amputation    Allergies  Allergen Reactions  . Metformin Diarrhea  . Pioglitazone Other (See Comments)    REACTION: wt gain  . Vancomycin     Erythematous rash, hands and toes became cyanotic.      Current Outpatient Prescriptions on File Prior to Visit  Medication Sig Dispense Refill  . amLODipine (NORVASC) 10 MG tablet Take 1 tablet (10 mg total) by mouth daily.  30 tablet  3  . anastrozole (ARIMIDEX) 1 MG tablet Take 1 mg by mouth daily.      . B Complex-C (SUPER B COMPLEX PO) Take 1 tablet by mouth daily.      . Blood Glucose Monitoring Suppl (FREESTYLE LITE) DEVI       . Calcium Carbonate-Vitamin D (CALCIUM-VITAMIN D) 600-200 MG-UNIT CAPS Take 1 tablet by mouth daily.       . clotrimazole-betamethasone (LOTRISONE) cream Apply 1 application topically 2 (two) times daily.  30 g  0  . FREESTYLE LITE test strip Use to check blood sugar 4 times a day.      . Glucosamine-Chondroitin (GLUCOSAMINE CHONDR COMPLEX PO) Take 1 capsule by mouth daily.      . insulin glargine (LANTUS) 100 UNIT/ML injection Inject 0.4 mLs (40 Units total) into the skin at bedtime.  3 mL  0  . insulin regular (NOVOLIN R,HUMULIN R) 100 units/mL injection Inject into the skin 3 (three) times daily before meals. Sliding scale 16-30  units      . lisinopril (PRINIVIL,ZESTRIL) 40 MG tablet Take 1 tablet (40 mg total) by mouth daily.  30 tablet  2  . metoprolol succinate (TOPROL-XL) 50 MG 24 hr tablet TAKE 1 TABLET BY MOUTH DAILY  30 tablet  2  . nystatin ointment (MYCOSTATIN) Apply 1 application topically 2 (two) times daily as needed (rash).      Glory Rosebush DELICA LANCETS FINE MISC Use to check blood sugar 4 times daily.      Marland Kitchen  traMADol (ULTRAM) 50 MG tablet Take 1-2 tablets (50-100 mg total)  by mouth every 6 (six) hours as needed.  40 tablet  0  . warfarin (COUMADIN) 5 MG tablet Take 2.5-5 mg by mouth daily. 2.5 mg tablet on Thursday and Friday and all other days 5mg       . Wound Cleansers (RADIAPLEX EX) Apply topically.       Current Facility-Administered Medications on File Prior to Visit  Medication Dose Route Frequency Provider Last Rate Last Dose  . silver sulfADIAZINE (SILVADENE) 1 % cream   Topical Daily Lora Paula, MD        BP 120/60  Pulse 66  Temp(Src) 97.4 F (36.3 C) (Oral)  Resp 16  Ht 5' 6.5" (1.689 m)  Wt 205 lb (92.987 kg)  BMI 32.60 kg/m2  SpO2 97%       Objective:   Physical Exam  Constitutional: She is oriented to person, place, and time. She appears well-developed and well-nourished. No distress.  HENT:  Head: Normocephalic and atraumatic.  Mouth/Throat: No oropharyngeal exudate.  Eyes: Pupils are equal, round, and reactive to light. No scleral icterus.  Cardiovascular: Normal rate, regular rhythm and normal heart sounds.  Exam reveals no gallop and no friction rub.   No murmur heard. Intact pedal pulses. Unable to feel posterior tibial due to edema.  Pulmonary/Chest: Effort normal and breath sounds normal. No respiratory distress. She has no wheezes. She has no rales.  Musculoskeletal: She exhibits edema.  2+ pitting edema noted bilateral ankles/feet.  Lymphadenopathy:    She has no cervical adenopathy.  Neurological: She is alert and oriented to person, place, and time.  Strength 5/5 lower extremities.  Skin: Skin is warm and dry. She is not diaphoretic.  Bilateral lower legs are discolored reddish-brown. Some white spots noted within discoloration.  Psychiatric: She has a normal mood and affect. Her behavior is normal. Thought content normal.          Assessment & Plan:  Patient seen by [facility] NP-student.  I have personally seen and examined patient and agree with Ms. Whitmire's assessment and plan.

## 2014-06-05 NOTE — Assessment & Plan Note (Addendum)
BP stable on amlodipine 10 mg, metoprolol 50 mg, and lisinopril 40 mg. Continue same.

## 2014-06-05 NOTE — Progress Notes (Signed)
Pre visit review using our clinic review tool, if applicable. No additional management support is needed unless otherwise documented below in the visit note. 

## 2014-06-05 NOTE — Assessment & Plan Note (Signed)
Bilateral LE edema Rx for lasix as needed for edema.

## 2014-06-06 NOTE — Telephone Encounter (Signed)
Patient notified of results.

## 2014-06-10 ENCOUNTER — Ambulatory Visit (HOSPITAL_BASED_OUTPATIENT_CLINIC_OR_DEPARTMENT_OTHER): Payer: Medicare Other

## 2014-06-10 ENCOUNTER — Ambulatory Visit (HOSPITAL_BASED_OUTPATIENT_CLINIC_OR_DEPARTMENT_OTHER)
Admission: RE | Admit: 2014-06-10 | Discharge: 2014-06-10 | Disposition: A | Discharge status: Home / Self Care | Payer: Medicare Other | Source: Ambulatory Visit | Attending: Family | Admitting: Family

## 2014-06-10 DIAGNOSIS — M5126 Other intervertebral disc displacement, lumbar region: Secondary | ICD-10-CM | POA: Diagnosis not present

## 2014-06-10 DIAGNOSIS — Z853 Personal history of malignant neoplasm of breast: Secondary | ICD-10-CM | POA: Insufficient documentation

## 2014-06-10 DIAGNOSIS — M5430 Sciatica, unspecified side: Secondary | ICD-10-CM | POA: Diagnosis present

## 2014-06-10 DIAGNOSIS — M545 Low back pain, unspecified: Secondary | ICD-10-CM

## 2014-06-10 DIAGNOSIS — M5440 Lumbago with sciatica, unspecified side: Secondary | ICD-10-CM

## 2014-06-11 ENCOUNTER — Other Ambulatory Visit: Payer: Self-pay | Admitting: Family

## 2014-06-12 ENCOUNTER — Telehealth: Payer: Self-pay | Admitting: Family

## 2014-06-12 DIAGNOSIS — M519 Unspecified thoracic, thoracolumbar and lumbosacral intervertebral disc disorder: Secondary | ICD-10-CM

## 2014-06-12 NOTE — Telephone Encounter (Signed)
Please let pt know that I reviewed her MRI. It shows bulging disc and pinched nerve in the lumbar spine.  I would like her to meet with Neurosurgery for further evaluation.

## 2014-06-12 NOTE — Telephone Encounter (Signed)
Left detailed message on home# and to call if any questions. 

## 2014-06-15 ENCOUNTER — Other Ambulatory Visit: Payer: Self-pay | Admitting: Family

## 2014-06-15 NOTE — Telephone Encounter (Signed)
Ok to call in as below

## 2014-06-15 NOTE — Telephone Encounter (Signed)
Requesting refill on tramadol, please call into [facility] [address]. Please call pt once approved.

## 2014-06-16 MED ORDER — TRAMADOL HCL 50 MG PO TABS: 50.0000 mg | ORAL_TABLET | Freq: Four times a day (QID) | ORAL | Status: DC | PRN

## 2014-06-16 NOTE — Telephone Encounter (Signed)
Refill called to pharmacy voicemail. 

## 2014-06-22 ENCOUNTER — Ambulatory Visit: Payer: Medicare Other | Admitting: Pulmonary Disease

## 2014-06-29 ENCOUNTER — Telehealth: Payer: Self-pay

## 2014-06-29 LAB — HEMOGLOBIN A1C: Hgb A1c MFr Bld: 9.1 % — AB (ref 4.0–6.0)

## 2014-06-29 NOTE — Telephone Encounter (Signed)
We received paperwork from [facility] dated 06-29-14 stating patients A1C 9.1%

## 2014-06-30 ENCOUNTER — Telehealth: Payer: Self-pay | Admitting: Family

## 2014-06-30 NOTE — Telephone Encounter (Signed)
Patient called in wanting to know if she should take lasix in the AM or PM?

## 2014-06-30 NOTE — Telephone Encounter (Signed)
Left detailed message on home # that it would probably be better to take lasix earlier in the day so it doesn't keep her up at night urinating.

## 2014-07-01 ENCOUNTER — Other Ambulatory Visit: Payer: Self-pay | Admitting: Family

## 2014-07-02 NOTE — Telephone Encounter (Signed)
Ok to send 40 tabs no refills

## 2014-07-03 ENCOUNTER — Other Ambulatory Visit: Payer: Self-pay | Admitting: Family

## 2014-07-04 NOTE — Telephone Encounter (Signed)
Rx phoned in. LDM

## 2014-07-10 ENCOUNTER — Ambulatory Visit (INDEPENDENT_AMBULATORY_CARE_PROVIDER_SITE_OTHER): Payer: Medicare Other | Admitting: Family

## 2014-07-10 ENCOUNTER — Other Ambulatory Visit: Payer: Self-pay

## 2014-07-10 ENCOUNTER — Encounter: Payer: Self-pay | Admitting: Family

## 2014-07-10 VITALS — BP 136/64 | HR 70 | Temp 98.4°F | Resp 16 | Ht 66.5 in | Wt 206.1 lb

## 2014-07-10 DIAGNOSIS — Z13820 Encounter for screening for osteoporosis: Secondary | ICD-10-CM

## 2014-07-10 DIAGNOSIS — Z23 Encounter for immunization: Secondary | ICD-10-CM

## 2014-07-10 DIAGNOSIS — R609 Edema, unspecified: Secondary | ICD-10-CM

## 2014-07-10 DIAGNOSIS — IMO0001 Reserved for inherently not codable concepts without codable children: Secondary | ICD-10-CM

## 2014-07-10 DIAGNOSIS — G4733 Obstructive sleep apnea (adult) (pediatric): Secondary | ICD-10-CM

## 2014-07-10 DIAGNOSIS — E1165 Type 2 diabetes mellitus with hyperglycemia: Secondary | ICD-10-CM

## 2014-07-10 LAB — BASIC METABOLIC PANEL
BUN: 22 mg/dL (ref 6–23)
CO2: 26 meq/L (ref 19–32)
CREATININE: 0.93 mg/dL (ref 0.50–1.10)
Calcium: 9.2 mg/dL (ref 8.4–10.5)
Chloride: 101 mEq/L (ref 96–112)
Glucose, Bld: 360 mg/dL — ABNORMAL HIGH (ref 70–99)
Potassium: 4.4 mEq/L (ref 3.5–5.3)
Sodium: 135 mEq/L (ref 135–145)

## 2014-07-10 NOTE — Patient Instructions (Signed)
Please complete your lab work prior to leaving. Follow up in 3 months.   

## 2014-07-10 NOTE — Assessment & Plan Note (Addendum)
Management per endo.  Flu shot today. Discussed rotating insulin administration sites- bruising likely secondary to chronic coumadin.

## 2014-07-10 NOTE — Progress Notes (Signed)
Pre visit review using our clinic review tool, if applicable. No additional management support is needed unless otherwise documented below in the visit note. 

## 2014-07-10 NOTE — Assessment & Plan Note (Signed)
Stable and improved, continue prn lasix.  Obtain follow up bmet.

## 2014-07-10 NOTE — Assessment & Plan Note (Signed)
We discussed that this is most likely cause for her fatigue. Nonetheless, she continues to decline cpap.

## 2014-07-10 NOTE — Progress Notes (Signed)
Subjective:    Patient ID: [name], female    DOB: May 15, 1941, 73 y.o.   MRN: [id number]  HPI  [name] is a 73 yr old female who presents today for her 1 month follow up. Last visit she complained of bilateral LE edema. An rx was provided for PRN lasix.  Reports some improvement in the LE edema with the addition of lasix.   DM2- she is following with dr. Posey Pronto.  Last saw him 1 week ago.  Reports that her lantus was switched to levemir. Reports sugar this AM was in 120s.   OSA- reports continued fatigue- continues to decline use of CPAP. Review of Systems    see HPI  Past Medical History  Diagnosis Date  . Hyperplastic colon polyp   . Diabetes mellitus type II   . Osteoarthritis   . GERD (gastroesophageal reflux disease)   . Osteopenia   . Lupus     of skin see dermatologist frequently  . OA (osteoarthritis of spine)     C-spine  . DVT (deep venous thrombosis)     right leg DVT 05-17-2008  . Leg swelling     left leg 2-10, u/s showed a old clot, has a hypercoag panel,  . History of cardiovascular stress test     10/09 had CP, stress test neg, likely GI (GERD)-09/2006-had CP: stress test (-)  . 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 DVT (deep vein thrombosis) in pregnancy   . DVT of leg (deep venous thrombosis) 07/28/2011  . 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 Allergy   . Clotting disorder     hx of PE and DVT  . C. difficile diarrhea     hx of,   . Skin cancer     skin CA  . Left leg pain 03/12/2013  . Otitis externa 03/19/2013    right  . PONV (postoperative nausea and vomiting)   . Hypertension     pcp  dr Conley Canal   h.p   le[facility]  . 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 Marital Status: Widowed    Spouse Name: N/A    Number of Children: 3  . Years of Education: N/A   Occupational History  . Retired     Social History Main Topics  . Smoking status: Never Smoker   . Smokeless tobacco: Never Used  . Alcohol Use: No  . Drug Use: No  . Sexual Activity: Not Currently   Other Topics Concern  . Not on file   Social History Narrative   Single-widow    3 children , 1 in [place] (son, daughter - [place])    [place] from [place], [place]   Alcohol Use - no      tobacco-- never     Illicit Drug Use - no    Pt is Jehovah's Witness-No blood products   Daughter - Ottie Neglia           Past Surgical History  Procedure Laterality Date  . Cholecystectomy    . Breast biopsy      left breast and right breast  . Breast lumpectomy      left breast-ductal carcinoma in situ  . Appendectomy    . Skin cancer excision  from back   . Breast lumpectomy with needle localization and axillary sentinel lymph node bx Right 06/17/2013    Procedure: BREAST LUMPECTOMY WITH NEEDLE LOCALIZATION AND AXILLARY SENTINEL LYMPH NODE BX;  Surgeon: Merrie Roof, MD;  Location: [facility];  Service: General;  Laterality: Right;  . Irrigation and debridement abscess Right 02/18/2014    Procedure: IRRIGATION AND DEBRIDEMENT RIGHT AXILLARY ABSCESS;  Surgeon: Leighton Ruff, MD;  Location: [facility];  Service: General;  Laterality: Right;    Family History  Problem Relation Age of Onset  . Diabetes Mother   . Diabetes    . Heart attack Neg Hx   . Colon cancer Neg Hx   . Breast cancer Neg Hx   . Esophageal cancer Neg Hx   . Rectal cancer Neg Hx   . Stomach cancer Neg Hx   . Cancer Father     melanoma  . Diabetes Brother   . Other Brother     amputation    Allergies  Allergen Reactions  . Metformin Diarrhea  . Pioglitazone Other (See Comments)    REACTION: wt gain  . Vancomycin     Erythematous rash, hands and toes became cyanotic.      Current Outpatient Prescriptions on File Prior to Visit  Medication Sig Dispense Refill  . amLODipine (NORVASC) 10 MG tablet TAKE 1 TABLET (10 MG TOTAL) BY MOUTH  DAILY.  30 tablet  3  . anastrozole (ARIMIDEX) 1 MG tablet Take 1 mg by mouth daily.      . B Complex-C (SUPER B COMPLEX PO) Take 1 tablet by mouth daily.      . Blood Glucose Monitoring Suppl (FREESTYLE LITE) DEVI       . Calcium Carbonate-Vitamin D (CALCIUM-VITAMIN D) 600-200 MG-UNIT CAPS Take 1 tablet by mouth daily.       . clotrimazole-betamethasone (LOTRISONE) cream Apply 1 application topically 2 (two) times daily.  30 g  0  . FREESTYLE LITE test strip Use to check blood sugar 4 times a day.      . furosemide (LASIX) 20 MG tablet Take 1 tablet (20 mg total) by mouth daily as needed.  30 tablet  0  . Glucosamine-Chondroitin (GLUCOSAMINE CHONDR COMPLEX PO) Take 1 capsule by mouth daily.      . insulin regular (NOVOLIN R,HUMULIN R) 100 units/mL injection Inject into the skin 3 (three) times daily before meals. Sliding scale 16-30  units      . lisinopril (PRINIVIL,ZESTRIL) 40 MG tablet TAKE 1 TABLET (40 MG TOTAL) BY MOUTH DAILY.  30 tablet  3  . metoprolol succinate (TOPROL-XL) 50 MG 24 hr tablet TAKE 1 TABLET BY MOUTH DAILY  30 tablet  2  . Mirabegron (MYRBETRIQ PO) Take 1 tablet by mouth daily.      Marland Kitchen nystatin ointment (MYCOSTATIN) Apply 1 application topically 2 (two) times daily as needed (rash).      Glory Rosebush DELICA LANCETS FINE MISC Use to check blood sugar 4 times daily.      . traMADol (ULTRAM) 50 MG tablet TAKE 1 OR 2 TABLETS EVERY 6 HOURS AS NEEDED  40 tablet  0  . warfarin (COUMADIN) 5 MG tablet Take 2.5-5 mg by mouth daily. 2.5 mg tablet on Thursday and Friday and all other days 5mg       . Wound Cleansers (RADIAPLEX EX) Apply topically.       Current Facility-Administered Medications on File Prior to Visit  Medication Dose Route Frequency Provider Last Rate Last  Dose  . silver sulfADIAZINE (SILVADENE) 1 % cream   Topical Daily Lora Paula, MD        BP 136/64  Pulse 70  Temp(Src) 98.4 F (36.9 C) (Oral)  Resp 16  Ht 5' 6.5" (1.689 m)  Wt 206 lb 1.9 oz (93.495 kg)   BMI 32.77 kg/m2  SpO2 99%    Objective:   Physical Exam  Constitutional: She is oriented to person, place, and time. She appears well-developed and well-nourished. No distress.  Cardiovascular: Normal rate and regular rhythm.   No murmur heard. Pulmonary/Chest: Effort normal and breath sounds normal. No respiratory distress. She has no wheezes. She has no rales. She exhibits no tenderness.  Musculoskeletal:  1+ RLE edema, 2+ LLE edema  Neurological: She is alert and oriented to person, place, and time.  Skin:  Some bruising noted left upper outer arm where she has administered insulin. Hyperpigmentation bilateral shins consistent with chronic venous stasis.           Assessment & Plan:

## 2014-07-12 NOTE — Telephone Encounter (Signed)
traMADol (ULTRAM) 50 MG tablet [[id number]]       Order Details      Dose, Route, Frequency: As Directed Order Comments:    No refills available for controlled drug        Dispense Quantity:  40 tablet Refills:  0 Fills Remaining:  0                 Sig: TAKE 1 OR 2 TABLETS EVERY 6 HOURS AS NEEDED               Written Date:  07/04/14 Expiration Date:  12/31/14        Start Date:  07/04/14 End Date:  --        Ordering Provider:  Debbrah Alar, NP Authorizing Provider:  Debbrah Alar, NP Ordering User:  De Burrs, CMA                      Original Order:  traMADol (ULTRAM) 50 MG tablet [[id number]]             Pharmacy:  [facility] [place] [facility] [address]          Pharmacy Comments:  --               Quantity Remaining:  0 tablet Quantity Filled:  0 tablet             Order Class      Phone In

## 2014-07-14 ENCOUNTER — Other Ambulatory Visit: Payer: Self-pay | Admitting: Family

## 2014-07-19 ENCOUNTER — Telehealth: Payer: Self-pay | Admitting: *Deleted

## 2014-07-19 NOTE — Telephone Encounter (Signed)
Immunizations updated.

## 2014-07-19 NOTE — Telephone Encounter (Signed)
Message copied by Ronny Flurry on Wed Jul 19, 2014 10:17 AM ------      Message from: [name], MELISSA      Created: Mon Jul 10, 2014  9:54 AM       Reports historical zostavax 2012 ------

## 2014-07-21 ENCOUNTER — Encounter (HOSPITAL_COMMUNITY): Payer: Self-pay | Admitting: Emergency Medicine

## 2014-07-21 ENCOUNTER — Inpatient Hospital Stay (HOSPITAL_COMMUNITY)
Admission: EM | Admit: 2014-07-21 | Discharge: 2014-07-25 | DRG: 249 | Disposition: A | Discharge status: Home / Self Care | Payer: Medicare Other | Attending: Cardiovascular Disease | Admitting: Cardiovascular Disease

## 2014-07-21 ENCOUNTER — Emergency Department (HOSPITAL_COMMUNITY): Payer: Medicare Other

## 2014-07-21 DIAGNOSIS — I10 Essential (primary) hypertension: Secondary | ICD-10-CM | POA: Diagnosis present

## 2014-07-21 DIAGNOSIS — E118 Type 2 diabetes mellitus with unspecified complications: Secondary | ICD-10-CM

## 2014-07-21 DIAGNOSIS — Z7901 Long term (current) use of anticoagulants: Secondary | ICD-10-CM

## 2014-07-21 DIAGNOSIS — Z853 Personal history of malignant neoplasm of breast: Secondary | ICD-10-CM | POA: Diagnosis not present

## 2014-07-21 DIAGNOSIS — I878 Other specified disorders of veins: Secondary | ICD-10-CM

## 2014-07-21 DIAGNOSIS — M1990 Unspecified osteoarthritis, unspecified site: Secondary | ICD-10-CM | POA: Diagnosis present

## 2014-07-21 DIAGNOSIS — M949 Disorder of cartilage, unspecified: Secondary | ICD-10-CM

## 2014-07-21 DIAGNOSIS — Z85820 Personal history of malignant melanoma of skin: Secondary | ICD-10-CM

## 2014-07-21 DIAGNOSIS — I2589 Other forms of chronic ischemic heart disease: Secondary | ICD-10-CM | POA: Diagnosis present

## 2014-07-21 DIAGNOSIS — Z531 Procedure and treatment not carried out because of patient's decision for reasons of belief and group pressure: Secondary | ICD-10-CM

## 2014-07-21 DIAGNOSIS — Z85828 Personal history of other malignant neoplasm of skin: Secondary | ICD-10-CM | POA: Diagnosis not present

## 2014-07-21 DIAGNOSIS — E785 Hyperlipidemia, unspecified: Secondary | ICD-10-CM | POA: Diagnosis present

## 2014-07-21 DIAGNOSIS — Z888 Allergy status to other drugs, medicaments and biological substances status: Secondary | ICD-10-CM

## 2014-07-21 DIAGNOSIS — Z808 Family history of malignant neoplasm of other organs or systems: Secondary | ICD-10-CM

## 2014-07-21 DIAGNOSIS — E1165 Type 2 diabetes mellitus with hyperglycemia: Secondary | ICD-10-CM | POA: Diagnosis present

## 2014-07-21 DIAGNOSIS — Z86718 Personal history of other venous thrombosis and embolism: Secondary | ICD-10-CM | POA: Diagnosis not present

## 2014-07-21 DIAGNOSIS — M899 Disorder of bone, unspecified: Secondary | ICD-10-CM | POA: Diagnosis present

## 2014-07-21 DIAGNOSIS — Z881 Allergy status to other antibiotic agents status: Secondary | ICD-10-CM

## 2014-07-21 DIAGNOSIS — M329 Systemic lupus erythematosus, unspecified: Secondary | ICD-10-CM | POA: Diagnosis present

## 2014-07-21 DIAGNOSIS — I214 Non-ST elevation (NSTEMI) myocardial infarction: Secondary | ICD-10-CM

## 2014-07-21 DIAGNOSIS — R079 Chest pain, unspecified: Secondary | ICD-10-CM | POA: Diagnosis present

## 2014-07-21 DIAGNOSIS — K219 Gastro-esophageal reflux disease without esophagitis: Secondary | ICD-10-CM | POA: Diagnosis present

## 2014-07-21 DIAGNOSIS — Z833 Family history of diabetes mellitus: Secondary | ICD-10-CM

## 2014-07-21 DIAGNOSIS — I872 Venous insufficiency (chronic) (peripheral): Secondary | ICD-10-CM

## 2014-07-21 DIAGNOSIS — IMO0001 Reserved for inherently not codable concepts without codable children: Secondary | ICD-10-CM | POA: Diagnosis present

## 2014-07-21 DIAGNOSIS — Z794 Long term (current) use of insulin: Secondary | ICD-10-CM | POA: Diagnosis not present

## 2014-07-21 DIAGNOSIS — M479 Spondylosis, unspecified: Secondary | ICD-10-CM | POA: Diagnosis present

## 2014-07-21 DIAGNOSIS — IMO0002 Reserved for concepts with insufficient information to code with codable children: Secondary | ICD-10-CM | POA: Diagnosis present

## 2014-07-21 HISTORY — DX: Reserved for inherently not codable concepts without codable children: IMO0001

## 2014-07-21 HISTORY — DX: Ischemic cardiomyopathy: I25.5

## 2014-07-21 HISTORY — DX: Essential (primary) hypertension: I10

## 2014-07-21 HISTORY — DX: Procedure and treatment not carried out because of patient's decision for reasons of belief and group pressure: Z53.1

## 2014-07-21 HISTORY — DX: Nonspecific elevation of levels of transaminase and lactic acid dehydrogenase (ldh): R74.0

## 2014-07-21 HISTORY — DX: Hyperlipidemia, unspecified: E78.5

## 2014-07-21 HISTORY — DX: Atherosclerotic heart disease of native coronary artery without angina pectoris: I25.10

## 2014-07-21 HISTORY — DX: Elevation of levels of liver transaminase levels: R74.01

## 2014-07-21 LAB — PRO B NATRIURETIC PEPTIDE: PRO B NATRI PEPTIDE: 35.7 pg/mL (ref 0–125)

## 2014-07-21 LAB — CBC WITH DIFFERENTIAL/PLATELET
BASOS ABS: 0 10*3/uL (ref 0.0–0.1)
Basophils Relative: 0 % (ref 0–1)
EOS ABS: 0.1 10*3/uL (ref 0.0–0.7)
Eosinophils Relative: 2 % (ref 0–5)
HCT: 34.6 % — ABNORMAL LOW (ref 36.0–46.0)
Hemoglobin: 11.3 g/dL — ABNORMAL LOW (ref 12.0–15.0)
LYMPHS ABS: 1 10*3/uL (ref 0.7–4.0)
LYMPHS PCT: 21 % (ref 12–46)
MCH: 27.8 pg (ref 26.0–34.0)
MCHC: 32.7 g/dL (ref 30.0–36.0)
MCV: 85 fL (ref 78.0–100.0)
Monocytes Absolute: 0.4 10*3/uL (ref 0.1–1.0)
Monocytes Relative: 9 % (ref 3–12)
NEUTROS PCT: 68 % (ref 43–77)
Neutro Abs: 3.2 10*3/uL (ref 1.7–7.7)
PLATELETS: 178 10*3/uL (ref 150–400)
RBC: 4.07 MIL/uL (ref 3.87–5.11)
RDW: 13.8 % (ref 11.5–15.5)
WBC: 4.6 10*3/uL (ref 4.0–10.5)

## 2014-07-21 LAB — COMPREHENSIVE METABOLIC PANEL
ALT: 53 U/L — AB (ref 0–35)
AST: 58 U/L — AB (ref 0–37)
Albumin: 3.6 g/dL (ref 3.5–5.2)
Alkaline Phosphatase: 108 U/L (ref 39–117)
Anion gap: 15 (ref 5–15)
BUN: 21 mg/dL (ref 6–23)
CO2: 23 meq/L (ref 19–32)
Calcium: 9.4 mg/dL (ref 8.4–10.5)
Chloride: 99 mEq/L (ref 96–112)
Creatinine, Ser: 0.7 mg/dL (ref 0.50–1.10)
GFR calc Af Amer: >90 mL/min (ref >90)
GFR calc non Af Amer: 83 mL/min — ABNORMAL LOW (ref >90)
Glucose, Bld: 288 mg/dL — ABNORMAL HIGH (ref 70–99)
Potassium: 3.9 mEq/L (ref 3.7–5.3)
SODIUM: 137 meq/L (ref 137–147)
TOTAL PROTEIN: 7.5 g/dL (ref 6.0–8.3)
Total Bilirubin: 0.4 mg/dL (ref 0.3–1.2)

## 2014-07-21 LAB — PROTIME-INR
INR: 3.47 — AB (ref 0.00–1.49)
Prothrombin Time: 34.9 seconds — ABNORMAL HIGH (ref 11.6–15.2)

## 2014-07-21 LAB — LIPASE, BLOOD: LIPASE: 36 U/L (ref 11–59)

## 2014-07-21 LAB — TROPONIN I: Troponin I: 3.39 ng/mL (ref <0.30)

## 2014-07-21 MED ORDER — SODIUM CHLORIDE 0.9 % IV SOLN
INTRAVENOUS | Status: DC
  Administered 2014-07-21 – 2014-07-22 (×2): via INTRAVENOUS

## 2014-07-21 MED ORDER — MORPHINE SULFATE 4 MG/ML IJ SOLN
4.0000 mg | Freq: Once | INTRAMUSCULAR | Status: AC
  Administered 2014-07-21: 4 mg via INTRAVENOUS
  Filled 2014-07-21: qty 1

## 2014-07-21 NOTE — H&P (Signed)
[name] is an 73 y.o. female.    Chief Complaint: chest pain Primary Cardiologist: new HPI: 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 She developed exertional dyspnea while walking today with continued pain at rest leading to EMS call and admission to [facility]. She localized the pain to her substernal/epigastric area with some radiation to her jaw. She cannot describe the pain except to say it was painful and uncomfortable currently almost resolved after morphine. She denies previous chest pain but her daughter says she had a bout of chest pain 2 weeks ago. She received aspirin and SL NTG in the ambulance with minimal improvement in symptoms. No syncope. No tobacco (ever). No family history of CAD.       Past Medical History  Diagnosis Date  . Hyperplastic colon polyp   . Diabetes mellitus type II   . Osteoarthritis   . GERD (gastroesophageal reflux disease)   . Osteopenia   . Lupus     of skin see dermatologist frequently  . OA (osteoarthritis of spine)     C-spine  . DVT (deep venous thrombosis)     right leg DVT 05-17-2008  . Leg swelling     left leg 2-10, u/s showed a old clot, has a hypercoag panel,  . History of cardiovascular stress test     10/09 had CP, stress test neg, likely GI (GERD)-09/2006-had CP: stress test (-)  . 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 DVT (deep vein thrombosis) in pregnancy   . DVT of leg (deep venous thrombosis) 07/28/2011  . 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 Allergy   . Clotting disorder     hx of PE and DVT  . C. difficile diarrhea     hx of,   . Skin cancer     skin CA  . Left leg pain 03/12/2013  . Otitis externa 03/19/2013    right  . PONV (postoperative nausea  and vomiting)   . Hypertension     pcp  dr Conley Canal   h.p   lebaur  . 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 Cholecystectomy    . Breast biopsy      left breast and right breast  . Breast lumpectomy      left breast-ductal carcinoma in situ  . Appendectomy    . Skin cancer excision      from back   . Breast lumpectomy with needle localization and axillary sentinel lymph node bx Right 06/17/2013    Procedure: BREAST LUMPECTOMY WITH NEEDLE LOCALIZATION AND AXILLARY SENTINEL LYMPH NODE BX;  Surgeon: Merrie Roof, MD;  Location: [place];  Service: General;  Laterality: Right;  . Irrigation and debridement abscess Right 02/18/2014    Procedure: IRRIGATION AND DEBRIDEMENT RIGHT AXILLARY ABSCESS;  Surgeon: Leighton Ruff, MD;  Location: [facility];  Service: General;  Laterality: Right;    Family History  Problem Relation Age of Onset  . Diabetes Mother   . Diabetes    . Heart attack Neg Hx   .  Colon cancer Neg Hx   . Breast cancer Neg Hx   . Esophageal cancer Neg Hx   . Rectal cancer Neg Hx   . Stomach cancer Neg Hx   . Cancer Father     melanoma  . Diabetes Brother   . Other Brother     amputation   Social History:  reports that she has never smoked. She has never used smokeless tobacco. She reports that she does not drink alcohol or use illicit drugs.  Allergies:  Allergies  Allergen Reactions  . Metformin Diarrhea  . Pioglitazone Other (See Comments)    weight gain  . Vancomycin Rash    Erythematous rash, hands and toes became cyanotic.       (Not in a hospital admission)  Results for orders placed during the hospital encounter of 07/21/14 (from the past 48 hour(s))  CBC WITH DIFFERENTIAL     Status: Abnormal   Collection Time    07/21/14  6:30 PM      Result Value Ref Range   WBC 4.6  4.0 - 10.5 K/uL   RBC 4.07   3.87 - 5.11 MIL/uL   Hemoglobin 11.3 (*) 12.0 - 15.0 g/dL   HCT 34.6 (*) 36.0 - 46.0 %   MCV 85.0  78.0 - 100.0 fL   MCH 27.8  26.0 - 34.0 pg   MCHC 32.7  30.0 - 36.0 g/dL   RDW 13.8  11.5 - 15.5 %   Platelets 178  150 - 400 K/uL   Neutrophils Relative % 68  43 - 77 %   Neutro Abs 3.2  1.7 - 7.7 K/uL   Lymphocytes Relative 21  12 - 46 %   Lymphs Abs 1.0  0.7 - 4.0 K/uL   Monocytes Relative 9  3 - 12 %   Monocytes Absolute 0.4  0.1 - 1.0 K/uL   Eosinophils Relative 2  0 - 5 %   Eosinophils Absolute 0.1  0.0 - 0.7 K/uL   Basophils Relative 0  0 - 1 %   Basophils Absolute 0.0  0.0 - 0.1 K/uL  COMPREHENSIVE METABOLIC PANEL     Status: Abnormal   Collection Time    07/21/14  6:30 PM      Result Value Ref Range   Sodium 137  137 - 147 mEq/L   Potassium 3.9  3.7 - 5.3 mEq/L   Chloride 99  96 - 112 mEq/L   CO2 23  19 - 32 mEq/L   Glucose, Bld 288 (*) 70 - 99 mg/dL   BUN 21  6 - 23 mg/dL   Creatinine, Ser 0.70  0.50 - 1.10 mg/dL   Calcium 9.4  8.4 - 10.5 mg/dL   Total Protein 7.5  6.0 - 8.3 g/dL   Albumin 3.6  3.5 - 5.2 g/dL   AST 58 (*) 0 - 37 U/L   ALT 53 (*) 0 - 35 U/L   Alkaline Phosphatase 108  39 - 117 U/L   Total Bilirubin 0.4  0.3 - 1.2 mg/dL   GFR calc non Af Amer 83 (*) >90 mL/min   GFR calc Af Amer >90  >90 mL/min   Comment: (NOTE)     The eGFR has been calculated using the CKD EPI equation.     This calculation has not been validated in all clinical situations.     eGFR's persistently <90 mL/min signify possible Chronic Kidney     Disease.   Anion gap 15  5 -  15  TROPONIN I     Status: Abnormal   Collection Time    07/21/14  6:30 PM      Result Value Ref Range   Troponin I 3.39 (*) <0.30 ng/mL   Comment:            Due to the release kinetics of cTnI,     a negative result within the first hours     of the onset of symptoms does not rule out     myocardial infarction with certainty.     If myocardial infarction is still suspected,     repeat the test at  appropriate intervals.     CRITICAL RESULT CALLED TO, READ BACK BY AND VERIFIED WITH:     A ALLEN,RN 1952 07/21/14 D BRADLEY  LIPASE, BLOOD     Status: None   Collection Time    07/21/14  6:30 PM      Result Value Ref Range   Lipase 36  11 - 59 U/L  PRO B NATRIURETIC PEPTIDE     Status: None   Collection Time    07/21/14  6:30 PM      Result Value Ref Range   Pro B Natriuretic peptide (BNP) 35.7  0 - 125 pg/mL  PROTIME-INR     Status: Abnormal   Collection Time    07/21/14  6:42 PM      Result Value Ref Range   Prothrombin Time 34.9 (*) 11.6 - 15.2 seconds   INR 3.47 (*) 0.00 - 1.49   Dg Chest 2 View  07/21/2014   CLINICAL DATA:  Chest pain  EXAM: CHEST  2 VIEW  COMPARISON:  06/09/2013  FINDINGS: Lungs are hypoaerated with crowding of the bronchovascular markings. Heart size is normal. No focal pulmonary opacity. No pleural effusion. Cholecystectomy clips are noted. No acute osseous finding.  IMPRESSION: Low lung volumes with crowding of the bronchovascular markings but no focal acute finding.   Electronically Signed   By: Conchita Paris M.D.   On: 07/21/2014 20:24    Review of Systems  Constitutional: Negative for fever and chills.  HENT: Negative for ear pain.   Eyes: Negative for blurred vision and photophobia.  Respiratory: Negative for cough and hemoptysis.   Cardiovascular: Positive for chest pain and leg swelling. Negative for palpitations and PND.  Gastrointestinal: Negative for heartburn, nausea, abdominal pain and diarrhea.  Genitourinary: Negative for dysuria and hematuria.  Musculoskeletal: Negative for myalgias and neck pain.  Neurological: Positive for tingling. Negative for sensory change, speech change and headaches.       Right/left neck/jaw  Endo/Heme/Allergies: Negative for polydipsia.  Psychiatric/Behavioral: Negative for depression, suicidal ideas, hallucinations and substance abuse.    Blood pressure 144/60, pulse 80, temperature 98.1 F (36.7 C),  temperature source Oral, resp. rate 20, SpO2 99.00%. Physical Exam  Nursing note and vitals reviewed. Constitutional: She is oriented to person, place, and time. She appears well-developed and well-nourished. No distress.  HENT:  Head: Normocephalic and atraumatic.  Nose: Nose normal.  Mouth/Throat: Oropharynx is clear and moist. No oropharyngeal exudate.  Eyes: Conjunctivae and EOM are normal. Pupils are equal, round, and reactive to light. No scleral icterus.  Neck: Normal range of motion. Neck supple. No JVD present. No tracheal deviation present.  Cardiovascular: Normal rate, regular rhythm, normal heart sounds and intact distal pulses.  Exam reveals no gallop.   No murmur heard. Respiratory: Effort normal and breath sounds normal. No respiratory distress. She has no wheezes.  She has no rales.  GI: Soft. Bowel sounds are normal. She exhibits no distension. There is no tenderness. There is no rebound.  Musculoskeletal: Normal range of motion. She exhibits edema.  Trace LEE bilaterally  Neurological: She is alert and oriented to person, place, and time. No cranial nerve deficit.  Skin: Skin is warm and dry. No rash noted. She is not diaphoretic. No erythema.  Psychiatric: She has a normal mood and affect. Her behavior is normal. Thought content normal.    Labs reviewed; h/h 11.3/34.6, plt 178, wbc 4.6, na 137, K 3.9, bun/cr 21/0.7, glucose 288, inr 3.5, troponin 3.4 ECG reviewed; SR, poor anterior R waves Chest x-ray: increased vascular markings  Assessment/Plan  Ms. 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 She's on warfarin with INR 3.5 so  anticoagulation not started. Breast cancer (right) 2014 in remission; left breast cancer '04.  1. NSTEMI: classic chest pain, worse with exertion, improved slightly with rest, some improvement with SL NTG. No heparin given INR 3.5. Holding warfarin for now. Aspirin give. Continue 81 mg daily. Metoprolol 50 mg XL per home. 80 mg qHS atorvastatin. Telemetry. Low threshold to pursue LHC if any changes in symptoms. Discussed likely need for invasive evaluation.  - telemetry, trend cardiac markers - BNP, TSH, lipid panel, Mg, echocardiogram in AM 2. T2DM: will give home levemir 40 units with insulin SS with planned dinner tonight 3. Hypertension: continue home metoprolol XL 50 mg and lisinopril 40 mg 4. Dyslipidemia: atoravstatin 80 mg qHS, first dose now 5. Prior PE on chronic anticoagluation; hold warfarin tonight, watch INR, start heparin for INR < 2  Emmerson Shuffield 07/21/2014, 8:41 PM

## 2014-07-21 NOTE — ED Provider Notes (Signed)
CSN: [id number]     Arrival date & time 07/21/14  1812 History   First MD Initiated Contact with Patient 07/21/14 1827     Chief Complaint  Patient presents with  . Chest Pain     (Consider location/radiation/quality/duration/timing/severity/associated sxs/prior Treatment) HPI Comments: Patient here complaining of acute onset of epigastric pain radiating to her jaw which began when she was walking. Patient denies associated dyspnea diaphoresis. No syncope or near-syncope. No recent fever or cough. She sat down and rested and the pain persisted. Called EMS and was given aspirin and nitroglycerin with no relief of her symptoms. No prior history of same. Her pain is persistent this time and nothing makes it better  Patient is a 73 y.o. female presenting with chest pain. The history is provided by the patient and a relative.  Chest Pain   Past Medical History  Diagnosis Date  . Hyperplastic colon polyp   . Diabetes mellitus type II   . Osteoarthritis   . GERD (gastroesophageal reflux disease)   . Osteopenia   . Lupus     of skin see dermatologist frequently  . OA (osteoarthritis of spine)     C-spine  . DVT (deep venous thrombosis)     right leg DVT 05-17-2008  . Leg swelling     left leg 2-10, u/s showed a old clot, has a hypercoag panel,  . History of cardiovascular stress test     10/09 had CP, stress test neg, likely GI (GERD)-09/2006-had CP: stress test (-)  . 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 DVT (deep vein thrombosis) in pregnancy   . DVT of leg (deep venous thrombosis) 07/28/2011  . 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 Allergy   . Clotting disorder     hx of PE and DVT  . C. difficile diarrhea     hx of,   . Skin cancer     skin CA  . Left leg pain 03/12/2013  . Otitis externa 03/19/2013    right  . PONV (postoperative nausea and vomiting)   . Hypertension     pcp  dr Conley Canal   h.p   lebaur  . Breast cancer     left ductal breast ca dx 2003 approx, s/p XRt  x 6 weeks, released from oncology (per  patient); dx right breast ca 2014   Past Surgical History  Procedure Laterality Date  . Cholecystectomy    . Breast biopsy      left breast and right breast  . Breast lumpectomy      left breast-ductal carcinoma in situ  . Appendectomy    . Skin cancer excision      from back   . Breast lumpectomy with needle localization and axillary sentinel lymph node bx Right 06/17/2013    Procedure: BREAST LUMPECTOMY WITH NEEDLE LOCALIZATION AND AXILLARY SENTINEL LYMPH NODE BX;  Surgeon: Merrie Roof, MD;  Location: [place];  Service: General;  Laterality: Right;  . Irrigation and debridement abscess Right 02/18/2014    Procedure: IRRIGATION AND DEBRIDEMENT RIGHT AXILLARY ABSCESS;  Surgeon: Leighton Ruff, MD;  Location: [facility];  Service: General;  Laterality: Right;   Family History  Problem Relation Age of Onset  . Diabetes Mother   . Diabetes    . Heart attack Neg Hx   . Colon cancer Neg Hx   . Breast cancer Neg Hx   . Esophageal cancer Neg Hx   . Rectal cancer Neg Hx   .  Stomach cancer Neg Hx   . Cancer Father     melanoma  . Diabetes Brother   . Other Brother     amputation   History  Substance Use Topics  . Smoking status: Never Smoker   . Smokeless tobacco: Never Used  . Alcohol Use: No   OB History   Grav Para Term Preterm Abortions TAB SAB Ect Mult Living                 Review of Systems  Cardiovascular: Positive for chest pain.  All other systems reviewed and are negative.     Allergies  Metformin; Pioglitazone; and Vancomycin  Home Medications   Prior to Admission medications   Medication Sig Start Date End Date Taking? Authorizing Provider  amLODipine (NORVASC) 10 MG tablet TAKE 1 TABLET (10 MG TOTAL) BY MOUTH DAILY.    Debbrah Alar, NP  anastrozole (ARIMIDEX) 1 MG tablet Take 1 mg by mouth daily.    Historical Provider, MD  B Complex-C (SUPER B COMPLEX PO) Take 1 tablet by mouth daily.    Historical Provider, MD   Blood Glucose Monitoring Suppl (FREESTYLE LITE) DEVI  07/04/13   Historical Provider, MD  Calcium Carbonate-Vitamin D (CALCIUM-VITAMIN D) 600-200 MG-UNIT CAPS Take 1 tablet by mouth daily.     Historical Provider, MD  clotrimazole-betamethasone (LOTRISONE) cream Apply 1 application topically 2 (two) times daily. 05/15/14   Debbrah Alar, NP  FREESTYLE LITE test strip Use to check blood sugar 4 times a day. 10/20/13   Historical Provider, MD  furosemide (LASIX) 20 MG tablet TAKE 1 TABLET (20 MG TOTAL) BY MOUTH DAILY AS NEEDED. 07/14/14   Debbrah Alar, NP  Glucosamine-Chondroitin (GLUCOSAMINE CHONDR COMPLEX PO) Take 1 capsule by mouth daily.    Historical Provider, MD  Insulin Detemir (LEVEMIR FLEXPEN) 100 UNIT/ML Pen Inject 40 Units into the skin at bedtime.    Historical Provider, MD  insulin regular (NOVOLIN R,HUMULIN R) 100 units/mL injection Inject into the skin 3 (three) times daily before meals. Sliding scale 16-30  units    Historical Provider, MD  lisinopril (PRINIVIL,ZESTRIL) 40 MG tablet TAKE 1 TABLET (40 MG TOTAL) BY MOUTH DAILY.    Debbrah Alar, NP  metoprolol succinate (TOPROL-XL) 50 MG 24 hr tablet TAKE 1 TABLET BY MOUTH DAILY 07/14/14   Debbrah Alar, NP  Mirabegron (MYRBETRIQ PO) Take 1 tablet by mouth daily.    Historical Provider, MD  nystatin ointment (MYCOSTATIN) Apply 1 application topically 2 (two) times daily as needed (rash).    Historical Provider, MD  [facility] DELICA LANCETS FINE MISC Use to check blood sugar 4 times daily. 11/29/13   Historical Provider, MD  traMADol (ULTRAM) 50 MG tablet TAKE 1 OR 2 TABLETS EVERY 6 HOURS AS NEEDED 07/04/14   Debbrah Alar, NP  warfarin (COUMADIN) 5 MG tablet Take 2.5-5 mg by mouth daily. 2.5 mg tablet on Thursday and Friday and all other days 5mg  08/06/11   Debbrah Alar, NP  Wound Cleansers (RADIAPLEX EX) Apply topically.    Historical Provider, MD   BP 144/60  Pulse 80  Temp(Src) 98.1 F (36.7 C) (Oral)   Resp 20  SpO2 99% Physical Exam  Nursing note and vitals reviewed. Constitutional: She is oriented to person, place, and time. She appears well-developed and well-nourished.  Non-toxic appearance. No distress.  HENT:  Head: Normocephalic and atraumatic.  Eyes: Conjunctivae, EOM and lids are normal. Pupils are equal, round, and reactive to light.  Neck: Normal range of motion.  Neck supple. No tracheal deviation present. No mass present.  Cardiovascular: Normal rate, regular rhythm and normal heart sounds.  Exam reveals no gallop.   No murmur heard. Pulmonary/Chest: Effort normal and breath sounds normal. No stridor. No respiratory distress. She has no decreased breath sounds. She has no wheezes. She has no rhonchi. She has no rales.  Abdominal: Soft. Normal appearance and bowel sounds are normal. She exhibits no distension. There is no tenderness. There is no rebound and no CVA tenderness.  Musculoskeletal: Normal range of motion. She exhibits no edema and no tenderness.  Neurological: She is alert and oriented to person, place, and time. She has normal strength. No cranial nerve deficit or sensory deficit. GCS eye subscore is 4. GCS verbal subscore is 5. GCS motor subscore is 6.  Skin: Skin is warm and dry. No abrasion and no rash noted.  Psychiatric: She has a normal mood and affect. Her speech is normal and behavior is normal.    ED Course  Procedures (including critical care time) Labs Review Labs Reviewed  CBC WITH DIFFERENTIAL  COMPREHENSIVE METABOLIC PANEL  TROPONIN I  LIPASE, BLOOD  PRO B NATRIURETIC PEPTIDE    Imaging Review No results found.   EKG Interpretation None      MDM   Final diagnoses:  None     Date: 07/21/2014  Rate: 74  Rhythm: normal sinus rhythm  QRS Axis: normal  Intervals: normal  ST/T Wave abnormalities: normal  Conduction Disutrbances:none  Narrative Interpretation:   Old EKG Reviewed: none available    Patient with positive troponin  here. She had received aspirin prior to arrival. Her INR is therapeutic endeavor no heparin will be started. She will be admitted for ACS  Leota Jacobsen, MD 07/21/14 2052

## 2014-07-21 NOTE — ED Notes (Signed)
Patient states after she was walking dog she began to have chest pain in central chest radiating up into jaw, patient received ASA 324 mg and nitro x 3 per EMS, nitro with no relief

## 2014-07-22 ENCOUNTER — Encounter (HOSPITAL_COMMUNITY)
Admission: EM | Disposition: A | Discharge status: Home / Self Care | Payer: Medicare Other | Source: Home / Self Care | Attending: Cardiovascular Disease

## 2014-07-22 ENCOUNTER — Encounter (HOSPITAL_COMMUNITY): Payer: Self-pay | Admitting: *Deleted

## 2014-07-22 DIAGNOSIS — I2510 Atherosclerotic heart disease of native coronary artery without angina pectoris: Secondary | ICD-10-CM

## 2014-07-22 DIAGNOSIS — R072 Precordial pain: Secondary | ICD-10-CM

## 2014-07-22 DIAGNOSIS — I214 Non-ST elevation (NSTEMI) myocardial infarction: Principal | ICD-10-CM

## 2014-07-22 DIAGNOSIS — I517 Cardiomegaly: Secondary | ICD-10-CM

## 2014-07-22 HISTORY — PX: LEFT HEART CATH: SHX5478

## 2014-07-22 LAB — PRO B NATRIURETIC PEPTIDE: Pro B Natriuretic peptide (BNP): 355.3 pg/mL — ABNORMAL HIGH (ref 0–125)

## 2014-07-22 LAB — LIPID PANEL
CHOL/HDL RATIO: 7.8 ratio
CHOLESTEROL: 156 mg/dL (ref 0–200)
HDL: 20 mg/dL — ABNORMAL LOW (ref >39)
LDL CALC: UNDETERMINED mg/dL (ref 0–99)
Triglycerides: 525 mg/dL — ABNORMAL HIGH (ref <150)
VLDL: UNDETERMINED mg/dL (ref 0–40)

## 2014-07-22 LAB — CBC
HCT: 34 % — ABNORMAL LOW (ref 36.0–46.0)
Hemoglobin: 11.2 g/dL — ABNORMAL LOW (ref 12.0–15.0)
MCH: 28 pg (ref 26.0–34.0)
MCHC: 32.9 g/dL (ref 30.0–36.0)
MCV: 85 fL (ref 78.0–100.0)
PLATELETS: 173 10*3/uL (ref 150–400)
RBC: 4 MIL/uL (ref 3.87–5.11)
RDW: 13.8 % (ref 11.5–15.5)
WBC: 6.1 10*3/uL (ref 4.0–10.5)

## 2014-07-22 LAB — COMPREHENSIVE METABOLIC PANEL
ALBUMIN: 3.3 g/dL — AB (ref 3.5–5.2)
ALT: 52 U/L — ABNORMAL HIGH (ref 0–35)
AST: 108 U/L — AB (ref 0–37)
Alkaline Phosphatase: 102 U/L (ref 39–117)
Anion gap: 14 (ref 5–15)
BUN: 19 mg/dL (ref 6–23)
CALCIUM: 8.9 mg/dL (ref 8.4–10.5)
CO2: 23 meq/L (ref 19–32)
CREATININE: 0.78 mg/dL (ref 0.50–1.10)
Chloride: 101 mEq/L (ref 96–112)
GFR calc Af Amer: >90 mL/min (ref >90)
GFR, EST NON AFRICAN AMERICAN: 80 mL/min — AB (ref >90)
Glucose, Bld: 298 mg/dL — ABNORMAL HIGH (ref 70–99)
Potassium: 4.1 mEq/L (ref 3.7–5.3)
SODIUM: 138 meq/L (ref 137–147)
TOTAL PROTEIN: 6.9 g/dL (ref 6.0–8.3)
Total Bilirubin: 0.3 mg/dL (ref 0.3–1.2)

## 2014-07-22 LAB — TSH: TSH: 1.74 u[IU]/mL (ref 0.350–4.500)

## 2014-07-22 LAB — APTT: APTT: 49 s — AB (ref 24–37)

## 2014-07-22 LAB — MRSA PCR SCREENING: MRSA by PCR: NEGATIVE

## 2014-07-22 LAB — GLUCOSE, CAPILLARY
GLUCOSE-CAPILLARY: 200 mg/dL — AB (ref 70–99)
GLUCOSE-CAPILLARY: 382 mg/dL — AB (ref 70–99)
Glucose-Capillary: 265 mg/dL — ABNORMAL HIGH (ref 70–99)
Glucose-Capillary: 239 mg/dL — ABNORMAL HIGH (ref 70–99)

## 2014-07-22 LAB — TROPONIN I: Troponin I: >20 ng/mL (ref <0.30)

## 2014-07-22 LAB — PROTIME-INR
INR: 3.47 — ABNORMAL HIGH (ref 0.00–1.49)
PROTHROMBIN TIME: 34.9 s — AB (ref 11.6–15.2)

## 2014-07-22 LAB — HEMOGLOBIN A1C
Hgb A1c MFr Bld: 9.3 % — ABNORMAL HIGH (ref <5.7)
Mean Plasma Glucose: 220 mg/dL — ABNORMAL HIGH (ref <117)

## 2014-07-22 LAB — MAGNESIUM: MAGNESIUM: 1.7 mg/dL (ref 1.5–2.5)

## 2014-07-22 SURGERY — LEFT HEART CATH
Anesthesia: Moderate Sedation

## 2014-07-22 MED ORDER — NITROGLYCERIN 0.4 MG SL SUBL
0.4000 mg | SUBLINGUAL_TABLET | SUBLINGUAL | Status: DC | PRN
Start: 2014-07-22 — End: 2014-07-25
  Administered 2014-07-22 (×2): 0.4 mg via SUBLINGUAL
  Filled 2014-07-22: qty 1

## 2014-07-22 MED ORDER — BIVALIRUDIN 250 MG IV SOLR
INTRAVENOUS | Status: AC
  Filled 2014-07-22: qty 250

## 2014-07-22 MED ORDER — ASPIRIN 81 MG PO CHEW
81.0000 mg | CHEWABLE_TABLET | ORAL | Status: DC
Start: 2014-07-23 — End: 2014-07-22

## 2014-07-22 MED ORDER — HEPARIN (PORCINE) IN NACL 2-0.9 UNIT/ML-% IJ SOLN
INTRAMUSCULAR | Status: AC
  Filled 2014-07-22: qty 500

## 2014-07-22 MED ORDER — MORPHINE SULFATE 2 MG/ML IJ SOLN
2.0000 mg | INTRAMUSCULAR | Status: DC | PRN
  Administered 2014-07-22: 2 mg via INTRAVENOUS
  Filled 2014-07-22: qty 1

## 2014-07-22 MED ORDER — ACETAMINOPHEN 325 MG PO TABS: 650.0000 mg | ORAL_TABLET | ORAL | Status: DC | PRN

## 2014-07-22 MED ORDER — ONDANSETRON HCL 4 MG/2ML IJ SOLN: 4.0000 mg | Freq: Four times a day (QID) | INTRAMUSCULAR | Status: DC | PRN

## 2014-07-22 MED ORDER — TRAMADOL HCL 50 MG PO TABS: 50.0000 mg | ORAL_TABLET | Freq: Four times a day (QID) | ORAL | Status: DC | PRN

## 2014-07-22 MED ORDER — SODIUM CHLORIDE 0.9 % IJ SOLN
3.0000 mL | Freq: Two times a day (BID) | INTRAMUSCULAR | Status: DC
  Administered 2014-07-22: 3 mL via INTRAVENOUS

## 2014-07-22 MED ORDER — INSULIN ASPART 100 UNIT/ML ~~LOC~~ SOLN
0.0000 [IU] | Freq: Every day | SUBCUTANEOUS | Status: DC
Start: 2014-07-22 — End: 2014-07-25
  Administered 2014-07-22 – 2014-07-23 (×2): 5 [IU] via SUBCUTANEOUS
  Administered 2014-07-24: 2 [IU] via SUBCUTANEOUS

## 2014-07-22 MED ORDER — CLOPIDOGREL BISULFATE 300 MG PO TABS
ORAL_TABLET | ORAL | Status: AC
  Filled 2014-07-22: qty 1

## 2014-07-22 MED ORDER — ACETAMINOPHEN 325 MG PO TABS: 650.0000 mg | ORAL_TABLET | Freq: Four times a day (QID) | ORAL | Status: DC | PRN

## 2014-07-22 MED ORDER — FAMOTIDINE IN NACL 20-0.9 MG/50ML-% IV SOLN
INTRAVENOUS | Status: AC
  Filled 2014-07-22: qty 50

## 2014-07-22 MED ORDER — CYCLOBENZAPRINE HCL 5 MG PO TABS
5.0000 mg | ORAL_TABLET | Freq: Every day | ORAL | Status: DC
  Administered 2014-07-22 – 2014-07-24 (×4): 5 mg via ORAL
  Filled 2014-07-22 (×5): qty 1

## 2014-07-22 MED ORDER — SODIUM CHLORIDE 0.9 % IJ SOLN: 3.0000 mL | INTRAMUSCULAR | Status: DC | PRN

## 2014-07-22 MED ORDER — ATORVASTATIN CALCIUM 80 MG PO TABS
80.0000 mg | ORAL_TABLET | Freq: Every day | ORAL | Status: DC
  Administered 2014-07-22 – 2014-07-25 (×4): 80 mg via ORAL
  Filled 2014-07-22 (×4): qty 1

## 2014-07-22 MED ORDER — ATORVASTATIN CALCIUM 80 MG PO TABS
80.0000 mg | ORAL_TABLET | Freq: Every day | ORAL | Status: DC
  Administered 2014-07-22: 80 mg via ORAL
  Filled 2014-07-22 (×2): qty 1

## 2014-07-22 MED ORDER — AMLODIPINE BESYLATE 10 MG PO TABS
10.0000 mg | ORAL_TABLET | Freq: Every day | ORAL | Status: DC
  Administered 2014-07-22 – 2014-07-24 (×3): 10 mg via ORAL
  Filled 2014-07-22 (×4): qty 1

## 2014-07-22 MED ORDER — METOPROLOL SUCCINATE ER 50 MG PO TB24
50.0000 mg | ORAL_TABLET | Freq: Every day | ORAL | Status: DC
  Administered 2014-07-22 – 2014-07-25 (×4): 50 mg via ORAL
  Filled 2014-07-22 (×4): qty 1

## 2014-07-22 MED ORDER — NITROGLYCERIN IN D5W 200-5 MCG/ML-% IV SOLN
INTRAVENOUS | Status: AC
  Filled 2014-07-22: qty 250

## 2014-07-22 MED ORDER — ASPIRIN EC 81 MG PO TBEC
81.0000 mg | DELAYED_RELEASE_TABLET | Freq: Every day | ORAL | Status: DC
  Administered 2014-07-22 – 2014-07-25 (×4): 81 mg via ORAL
  Filled 2014-07-22 (×4): qty 1

## 2014-07-22 MED ORDER — LISINOPRIL 40 MG PO TABS
40.0000 mg | ORAL_TABLET | Freq: Every day | ORAL | Status: DC
  Administered 2014-07-22 – 2014-07-25 (×4): 40 mg via ORAL
  Filled 2014-07-22 (×4): qty 1

## 2014-07-22 MED ORDER — MORPHINE SULFATE 2 MG/ML IJ SOLN: 1.0000 mg | INTRAMUSCULAR | Status: DC | PRN

## 2014-07-22 MED ORDER — MIRABEGRON ER 50 MG PO TB24
50.0000 mg | ORAL_TABLET | Freq: Every day | ORAL | Status: DC
  Administered 2014-07-22 – 2014-07-25 (×4): 50 mg via ORAL
  Filled 2014-07-22 (×4): qty 1

## 2014-07-22 MED ORDER — SODIUM CHLORIDE 0.9 % IV SOLN
INTRAVENOUS | Status: AC
  Administered 2014-07-22: 13:00:00 via INTRAVENOUS

## 2014-07-22 MED ORDER — SODIUM CHLORIDE 0.9 % IV SOLN: 250.0000 mL | INTRAVENOUS | Status: DC | PRN

## 2014-07-22 MED ORDER — ANASTROZOLE 1 MG PO TABS
1.0000 mg | ORAL_TABLET | Freq: Every day | ORAL | Status: DC
  Administered 2014-07-22 – 2014-07-25 (×4): 1 mg via ORAL
  Filled 2014-07-22 (×4): qty 1

## 2014-07-22 MED ORDER — CLOPIDOGREL BISULFATE 75 MG PO TABS
75.0000 mg | ORAL_TABLET | Freq: Every day | ORAL | Status: DC
  Administered 2014-07-23 – 2014-07-25 (×3): 75 mg via ORAL
  Filled 2014-07-22 (×3): qty 1

## 2014-07-22 MED ORDER — SODIUM CHLORIDE 0.9 % IJ SOLN
3.0000 mL | Freq: Two times a day (BID) | INTRAMUSCULAR | Status: DC
  Administered 2014-07-23 – 2014-07-24 (×4): 3 mL via INTRAVENOUS

## 2014-07-22 MED ORDER — NITROGLYCERIN 1 MG/10 ML FOR IR/CATH LAB
INTRA_ARTERIAL | Status: AC
  Filled 2014-07-22: qty 10

## 2014-07-22 MED ORDER — INSULIN ASPART 100 UNIT/ML ~~LOC~~ SOLN: 4.0000 [IU] | Freq: Three times a day (TID) | SUBCUTANEOUS | Status: DC

## 2014-07-22 MED ORDER — LIDOCAINE HCL (PF) 1 % IJ SOLN
INTRAMUSCULAR | Status: AC
  Filled 2014-07-22: qty 30

## 2014-07-22 MED ORDER — INSULIN ASPART 100 UNIT/ML ~~LOC~~ SOLN: 0.0000 [IU] | Freq: Three times a day (TID) | SUBCUTANEOUS | Status: DC

## 2014-07-22 MED ORDER — CALCIUM CARBONATE-VITAMIN D 500-200 MG-UNIT PO TABS
1.0000 | ORAL_TABLET | Freq: Every day | ORAL | Status: DC
  Administered 2014-07-22: 1 via ORAL
  Filled 2014-07-22 (×2): qty 1

## 2014-07-22 MED ORDER — ASPIRIN 300 MG RE SUPP
300.0000 mg | RECTAL | Status: AC
  Filled 2014-07-22: qty 1

## 2014-07-22 MED ORDER — FUROSEMIDE 20 MG PO TABS
20.0000 mg | ORAL_TABLET | Freq: Once | ORAL | Status: AC
  Administered 2014-07-22: 20 mg via ORAL
  Filled 2014-07-22: qty 1

## 2014-07-22 MED ORDER — INSULIN DETEMIR 100 UNIT/ML ~~LOC~~ SOLN
40.0000 [IU] | Freq: Every day | SUBCUTANEOUS | Status: DC
  Administered 2014-07-22 – 2014-07-23 (×3): 40 [IU] via SUBCUTANEOUS
  Filled 2014-07-22 (×4): qty 0.4

## 2014-07-22 MED ORDER — INSULIN ASPART 100 UNIT/ML ~~LOC~~ SOLN
0.0000 [IU] | Freq: Three times a day (TID) | SUBCUTANEOUS | Status: DC
  Administered 2014-07-22: 7 [IU] via SUBCUTANEOUS
  Administered 2014-07-22 – 2014-07-23 (×2): 11 [IU] via SUBCUTANEOUS
  Administered 2014-07-23: 15 [IU] via SUBCUTANEOUS
  Administered 2014-07-23: 11 [IU] via SUBCUTANEOUS
  Administered 2014-07-24: 7 [IU] via SUBCUTANEOUS
  Administered 2014-07-24: 11 [IU] via SUBCUTANEOUS
  Administered 2014-07-25: 15 [IU] via SUBCUTANEOUS
  Administered 2014-07-25: 7 [IU] via SUBCUTANEOUS
  Administered 2014-07-25: 15 [IU] via SUBCUTANEOUS

## 2014-07-22 MED ORDER — HEPARIN SODIUM (PORCINE) 1000 UNIT/ML IJ SOLN
INTRAMUSCULAR | Status: AC
  Filled 2014-07-22: qty 1

## 2014-07-22 MED ORDER — ASPIRIN 81 MG PO CHEW: 81.0000 mg | CHEWABLE_TABLET | Freq: Every day | ORAL | Status: DC

## 2014-07-22 MED ORDER — ASPIRIN 81 MG PO CHEW
324.0000 mg | CHEWABLE_TABLET | ORAL | Status: AC
  Administered 2014-07-22: 324 mg via ORAL
  Filled 2014-07-22: qty 4

## 2014-07-22 MED ORDER — VERAPAMIL HCL 2.5 MG/ML IV SOLN
INTRAVENOUS | Status: AC
  Filled 2014-07-22: qty 2

## 2014-07-22 NOTE — CV Procedure (Signed)
[name] is a 73 y.o. female    [id number] LOCATION:  FACILITY: [facility]  PHYSICIAN: Quay Burow, M.D. 1941-06-05   DATE OF PROCEDURE:  07/22/2014  DATE OF DISCHARGE:     CARDIAC CATHETERIZATION     History obtained from chart review.[name] is a 73 year old female admitted yesterday with chest pain. She has no prior cardiac history. She does have a history of a DVT and pulmonary embolus on Coumadin anticoagulation with an INR in the mid 3 range. She is also a Jehovah's Witness. Her EKG showed subtle changes in her troponin was significantly elevated with ongoing chest pain. Therefore, it was decided to bring her to the cath lab to define her anatomy and potentially intervene.   PROCEDURE DESCRIPTION:   The patient was brought to the second floor [facility] cath lab in the postabsorptive state. She was not premedicated . Her right wristwas prepped and shaved in usual sterile fashion. Xylocaine 1% was used for local anesthesia. A 5 French sheath was inserted into the right radial artery using standard Seldinger technique. The patient received 4000 units  of heparin  intravenously.  A 5 [place] TIG catheter and pigtail catheters were used for selective coronary angiography and left ventriculography respectively. Omnipaque dye was used for the entirety of the case. Retrograde aortic, left ventricular and pullback pressures were recorded.    HEMODYNAMICS:    AO SYSTOLIC/AO DIASTOLIC: 235/36   LV SYSTOLIC/LV DIASTOLIC: 144/31  ANGIOGRAPHIC RESULTS:   1. Left main; normal  2. LAD; 95% segmental mid-to distal 3. Left circumflex; nondominant and normal.  4. Right coronary artery; dominant and normal 5. Left ventriculography; RAO left ventriculogram was performed using  25 mL of Visipaque dye at 12 mL/second. The overall LVEF estimated  35-40 %  With wall motion abnormalities notable for apical dyskinesia and distal anterior wall severe  hypokinesia.  IMPRESSION:Ms. Sivertsen has high grade segmental mid to distal LAD stenosis responsible for acute coronary syndrome. We'll proceed with PCI and stenting using Angiomax, Plavix and bare-metal stent.  Procedure description: The patient received Angiomax bolus with an ACT of 512. Total contrast administered the patient was 160 cc. Using a 6 [place] XB LAD 3-1/2 cm guide catheter along with a 1/190 cm pleural water guidewire and a 2 mm x 12 mm balloon predilatation was performed. Following this stenting was performed with a 2.25 mm x 23 mm long mini vision bare metal stent deployed at 16 atmospheres (2.55 mm) resulting in reduction of a 95% segmental stenosis to 0% residual with TIMI-3 flow no dissection. The patient tolerated the procedure well. The guidewire and catheter were removed. The sheath was removed and a TR band was placed on the right wrist to achieve patent hemostasis.  Final impression: Successful PCI and stenting of mid to distal LAD in the setting of non-STEMI with bare-metal stent, Angiomax and Plavix. The patient's INR is already supra-therapeutic at 3.5. Coumadin is on hold. Plans will be to restart Coumadin once INR falls below 2.5 and continue aspirin 81 mg and Plavix. The Plavix can be discontinued after 4 weeks given that she has a bare-metal stent. A 2-D echocardiogram is pending. The decision for placement of a LIFEVEST  will depend on her ejection fraction.  Lorretta Harp MD, [facility] 07/22/2014 12:43 PM

## 2014-07-22 NOTE — Progress Notes (Signed)
ANTICOAGULATION CONSULT NOTE - Initial Consult  Pharmacy Consult for warfarin  Indication: hx DVT/PE  Allergies  Allergen Reactions  . Metformin Diarrhea  . Pioglitazone Other (See Comments)    weight gain  . Vancomycin Rash    Erythematous rash, hands and toes became cyanotic.      Patient Measurements: Height: 5' 6.5" (168.9 cm) Weight: 197 lb 12 oz (89.7 kg) IBW/kg (Calculated) : 60.45 Heparin Dosing Weight:   Vital Signs: Temp: 97.5 F (36.4 C) (09/05 0800) Temp src: Oral (09/05 0800) BP: 124/87 mmHg (09/05 1100) Pulse Rate: 77 (09/05 1100)  Labs:  Recent Labs  07/21/14 1830 07/21/14 1842 07/22/14 0244 07/22/14 0648  HGB 11.3*  --  11.2*  --   HCT 34.6*  --  34.0*  --   PLT 178  --  173  --   APTT  --   --  49*  --   LABPROT  --  34.9* 34.9*  --   INR  --  3.47* 3.47*  --   CREATININE 0.70  --  0.78  --   TROPONINI 3.39*  --  >20.00* >20.00*    Estimated Creatinine Clearance: 70.3 ml/min (by C-G formula based on Cr of 0.78).   Medical History: Past Medical History  Diagnosis Date  . Hyperplastic colon polyp   . Diabetes mellitus type II   . Osteoarthritis   . GERD (gastroesophageal reflux disease)   . Osteopenia   . Lupus     of skin see dermatologist frequently  . OA (osteoarthritis of spine)     C-spine  . DVT (deep venous thrombosis)     right leg DVT 05-17-2008  . Leg swelling     left leg 2-10, u/s showed a old clot, has a hypercoag panel,  . History of cardiovascular stress test     10/09 had CP, stress test neg, likely GI (GERD)-09/2006-had CP: stress test (-)  . 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 DVT (deep vein thrombosis) in pregnancy   . DVT of leg (deep venous thrombosis) 07/28/2011  . 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 Allergy   . Clotting disorder     hx of PE and DVT  . C. difficile diarrhea     hx of,   . Skin cancer     skin CA  . Left leg pain 03/12/2013  . Otitis externa 03/19/2013    right  . PONV  (postoperative nausea and vomiting)   . Hypertension     pcp  dr Conley Canal   h.p   lebaur  . Breast cancer     left ductal breast ca dx 2003 approx, s/p XRt x 6 weeks, released from oncology (per  patient); dx right breast ca 2014    Medications:  Prescriptions prior to admission  Medication Sig Dispense Refill  . acetaminophen (TYLENOL) 325 MG tablet Take 650 mg by mouth every 6 (six) hours as needed (for pain).      Marland Kitchen amLODipine (NORVASC) 10 MG tablet Take 10 mg by mouth daily.      Marland Kitchen anastrozole (ARIMIDEX) 1 MG tablet Take 1 mg by mouth daily.      . B Complex-C (SUPER B COMPLEX PO) Take 1 tablet by mouth daily.      . Calcium Carbonate-Vitamin D (CALCIUM-VITAMIN D) 600-200 MG-UNIT CAPS Take 1 tablet by mouth daily.       . cyclobenzaprine (FLEXERIL) 5 MG tablet Take 5 mg by mouth at bedtime.      Marland Kitchen  furosemide (LASIX) 20 MG tablet Take 20 mg by mouth daily as needed (for swelling of legs).      . Glucosamine-Chondroitin (GLUCOSAMINE CHONDR COMPLEX PO) Take 1 capsule by mouth daily.      . insulin detemir (LEVEMIR) 100 UNIT/ML injection Inject 40 Units into the skin at bedtime.      . insulin regular (NOVOLIN R,HUMULIN R) 100 units/mL injection Inject into the skin 3 (three) times daily before meals. Sliding scale 16-30  units      . lisinopril (PRINIVIL,ZESTRIL) 40 MG tablet Take 40 mg by mouth daily.      . metoprolol succinate (TOPROL-XL) 50 MG 24 hr tablet Take 50 mg by mouth daily. Take with or immediately following a meal.      . mirabegron ER (MYRBETRIQ) 50 MG TB24 tablet Take 50 mg by mouth daily. Started 8/26. (7 day package) Started 2nd package (7 days each) on 9/2.      . traMADol (ULTRAM) 50 MG tablet Take 50-100 mg by mouth every 6 (six) hours as needed (for pain).      Marland Kitchen warfarin (COUMADIN) 5 MG tablet Take 5-7.5 mg by mouth daily. 7.5 mg tablet Monday - Friday and 5mg  Saturdays and Sundays      . Blood Glucose Monitoring Suppl (FREESTYLE LITE) DEVI       . FREESTYLE LITE  test strip Use to check blood sugar 4 times a day.      Glory Rosebush DELICA LANCETS FINE MISC Use to check blood sugar 4 times daily.        Assessment: 73 y.o. female presents from OSH with NSTEMI (Trop 3.4-> >20). Pt on Coumadin PTA for DVT/PE 07/2011. INR 3.5 on admission.   Coumadin was being held for cath, with elevated in cardiac enzymes patient taken urgently to cath this afternoon, BMS placed and patient loaded with plavix.Marland Kitchen No plans to use IV heparin, new orders to restart warfarin once INR trends down within therapeutic range.  Goal of Therapy:  Heparin level 0.3-0.7 units/ml Monitor platelets by anticoagulation protocol: Yes   Plan:  1) Daily INR 2) Restart warfarin once INR trends down  Erin Hearing PharmD., BCPS Clinical Pharmacist Pager [phone] 07/22/2014 12:59 PM

## 2014-07-22 NOTE — ED Notes (Signed)
Report attempted 

## 2014-07-22 NOTE — Interval H&P Note (Signed)
Cath Lab Visit (complete for each Cath Lab visit)  Clinical Evaluation Leading to the Procedure:   ACS: Yes.    Non-ACS:    Anginal Classification: CCS IV  Anti-ischemic medical therapy: Maximal Therapy (2 or more classes of medications)  Non-Invasive Test Results: No non-invasive testing performed  Prior CABG: No previous CABG      History and Physical Interval Note:  07/22/2014 11:45 AM  [name]  has presented today for surgery, with the diagnosis of STEMI  The various methods of treatment have been discussed with the patient and family. After consideration of risks, benefits and other options for treatment, the patient has consented to  Procedure(s): LEFT HEART CATH (N/A) as a surgical intervention .  The patient's history has been reviewed, patient examined, no change in status, stable for surgery.  I have reviewed the patient's chart and labs.  Questions were answered to the patient's satisfaction.     Lorretta Harp

## 2014-07-22 NOTE — Progress Notes (Signed)
Pt transferred to cath lab alert and oriented without questions, family at bedside

## 2014-07-22 NOTE — Progress Notes (Signed)
ANTICOAGULATION CONSULT NOTE - Initial Consult  Pharmacy Consult for Heparin when INR < 2 Indication: chest pain/ACS  Allergies  Allergen Reactions  . Metformin Diarrhea  . Pioglitazone Other (See Comments)    weight gain  . Vancomycin Rash    Erythematous rash, hands and toes became cyanotic.      Patient Measurements:   Heparin Dosing Weight:   Vital Signs: Temp: 98.3 F (36.8 C) (09/05 0110) Temp src: Oral (09/05 0110) BP: 158/69 mmHg (09/05 0110) Pulse Rate: 74 (09/05 0110)  Labs:  Recent Labs  07/21/14 1830 07/21/14 1842  HGB 11.3*  --   HCT 34.6*  --   PLT 178  --   LABPROT  --  34.9*  INR  --  3.47*  CREATININE 0.70  --   TROPONINI 3.39*  --     The CrCl is unknown because both a height and weight (above a minimum accepted value) are required for this calculation.   Medical History: Past Medical History  Diagnosis Date  . Hyperplastic colon polyp   . Diabetes mellitus type II   . Osteoarthritis   . GERD (gastroesophageal reflux disease)   . Osteopenia   . Lupus     of skin see dermatologist frequently  . OA (osteoarthritis of spine)     C-spine  . DVT (deep venous thrombosis)     right leg DVT 05-17-2008  . Leg swelling     left leg 2-10, u/s showed a old clot, has a hypercoag panel,  . History of cardiovascular stress test     10/09 had CP, stress test neg, likely GI (GERD)-09/2006-had CP: stress test (-)  . 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 DVT (deep vein thrombosis) in pregnancy   . DVT of leg (deep venous thrombosis) 07/28/2011  . 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 Allergy   . Clotting disorder     hx of PE and DVT  . C. difficile diarrhea     hx of,   . Skin cancer     skin CA  . Left leg pain 03/12/2013  . Otitis externa 03/19/2013    right  . PONV (postoperative nausea and vomiting)   . Hypertension     pcp  dr Conley Canal   h.p   lebaur  . Breast cancer     left ductal breast ca dx 2003 approx, s/p XRt x 6 weeks,  released from oncology (per  patient); dx right breast ca 2014    Medications:  Prescriptions prior to admission  Medication Sig Dispense Refill  . acetaminophen (TYLENOL) 325 MG tablet Take 650 mg by mouth every 6 (six) hours as needed (for pain).      Marland Kitchen amLODipine (NORVASC) 10 MG tablet Take 10 mg by mouth daily.      Marland Kitchen anastrozole (ARIMIDEX) 1 MG tablet Take 1 mg by mouth daily.      . B Complex-C (SUPER B COMPLEX PO) Take 1 tablet by mouth daily.      . Calcium Carbonate-Vitamin D (CALCIUM-VITAMIN D) 600-200 MG-UNIT CAPS Take 1 tablet by mouth daily.       . cyclobenzaprine (FLEXERIL) 5 MG tablet Take 5 mg by mouth at bedtime.      . furosemide (LASIX) 20 MG tablet Take 20 mg by mouth daily as needed (for swelling of legs).      . Glucosamine-Chondroitin (GLUCOSAMINE CHONDR COMPLEX PO) Take 1 capsule by mouth daily.      . insulin  detemir (LEVEMIR) 100 UNIT/ML injection Inject 40 Units into the skin at bedtime.      . insulin regular (NOVOLIN R,HUMULIN R) 100 units/mL injection Inject into the skin 3 (three) times daily before meals. Sliding scale 16-30  units      . lisinopril (PRINIVIL,ZESTRIL) 40 MG tablet Take 40 mg by mouth daily.      . metoprolol succinate (TOPROL-XL) 50 MG 24 hr tablet Take 50 mg by mouth daily. Take with or immediately following a meal.      . mirabegron ER (MYRBETRIQ) 50 MG TB24 tablet Take 50 mg by mouth daily. Started 8/26. (7 day package) Started 2nd package (7 days each) on 9/2.      . traMADol (ULTRAM) 50 MG tablet Take 50-100 mg by mouth every 6 (six) hours as needed (for pain).      Marland Kitchen warfarin (COUMADIN) 5 MG tablet Take 5-7.5 mg by mouth daily. 7.5 mg tablet Monday - Friday and 5mg  Saturdays and Sundays      . Blood Glucose Monitoring Suppl (FREESTYLE LITE) DEVI       . FREESTYLE LITE test strip Use to check blood sugar 4 times a day.      Glory Rosebush DELICA LANCETS FINE MISC Use to check blood sugar 4 times daily.        Assessment: 73 y.o. female  presents from OSH with NSTEMI (Trop 3.4). Pt on Coumadin PTA for DVT/PE 07/2011. INR 3.5 on admission. Coumadin currently being held for cath. Plan to start heparin when INR<2.  Goal of Therapy:  Heparin level 0.3-0.7 units/ml Monitor platelets by anticoagulation protocol: Yes   Plan:  1) Daily INR 2) Will start heparin when INR <2  Sherlon Handing, PharmD, BCPS Clinical pharmacist, pager [phone] 07/22/2014,1:15 AM

## 2014-07-22 NOTE — Progress Notes (Addendum)
  1043:  MD at bedside  1040:  MD will come for immediate assessment  1025:  Pt continues to c/o of CP 6/10 that is radiating between teeth and back post Morphine administration, STAT EKG obtained and results are in Epic.   Pt also c/o bilateral below knee pain.  MD text paged.  Nurse will continue to monitor   [phone]: Pt continues to c/o chest pain post 2 administrations of CP, pt refused 3rd Nitro. PA made aware of CP prior to second dose of Nitro.  Nurse administered morphine and ordered STAT EKG.  MD text paged

## 2014-07-22 NOTE — Progress Notes (Signed)
Patient ID: [name], female   DOB: 06/20/1941, 73 y.o.   MRN: [id number]    SUBJECTIVE:   The patient was admitted last night. Over the night she has had chest pain that decreased somewhat. This morning she has worsening pain. Her troponin has gone up to 20. EKG shows decreased anterior R-wave. There is no marked ST elevation at this time.   Filed Vitals:   07/22/14 0116 07/22/14 0412 07/22/14 0741 07/22/14 0800  BP: 148/63 130/53 126/59   Pulse: 71 68 72   Temp:  98 F (36.7 C)  97.5 F (36.4 C)  TempSrc:  Oral  Oral  Resp: 18 16 15    Height:      Weight:      SpO2: 97% 97%  94%     Intake/Output Summary (Last 24 hours) at 07/22/14 1100 Last data filed at 07/22/14 0600  Gross per 24 hour  Intake 178.33 ml  Output      0 ml  Net 178.33 ml    LABS: Basic Metabolic Panel:  Recent Labs  07/21/14 1830 07/22/14 0244  NA 137 138  K 3.9 4.1  CL 99 101  CO2 23 23  GLUCOSE 288* 298*  BUN 21 19  CREATININE 0.70 0.78  CALCIUM 9.4 8.9  MG  --  1.7   Liver Function Tests:  Recent Labs  07/21/14 1830 07/22/14 0244  AST 58* 108*  ALT 53* 52*  ALKPHOS 108 102  BILITOT 0.4 0.3  PROT 7.5 6.9  ALBUMIN 3.6 3.3*    Recent Labs  07/21/14 1830  LIPASE 36   CBC:  Recent Labs  07/21/14 1830 07/22/14 0244  WBC 4.6 6.1  NEUTROABS 3.2  --   HGB 11.3* 11.2*  HCT 34.6* 34.0*  MCV 85.0 85.0  PLT 178 173   Cardiac Enzymes:  Recent Labs  07/21/14 1830 07/22/14 0244 07/22/14 0648  TROPONINI 3.39* >20.00* >20.00*   BNP: No components found with this basename: POCBNP,  D-Dimer: No results found for this basename: DDIMER,  in the last 72 hours Hemoglobin A1C: No results found for this basename: HGBA1C,  in the last 72 hours Fasting Lipid Panel:  Recent Labs  07/22/14 0335  CHOL 156  HDL 20*  LDLCALC UNABLE TO CALCULATE IF TRIGLYCERIDE OVER 400 mg/dL  TRIG 525*  CHOLHDL 7.8   Thyroid Function Tests:  Recent Labs  07/22/14 0244  TSH 1.740      RADIOLOGY: Dg Chest 2 View  07/21/2014   CLINICAL DATA:  Chest pain  EXAM: CHEST  2 VIEW  COMPARISON:  06/09/2013  FINDINGS: Lungs are hypoaerated with crowding of the bronchovascular markings. Heart size is normal. No focal pulmonary opacity. No pleural effusion. Cholecystectomy clips are noted. No acute osseous finding.  IMPRESSION: Low lung volumes with crowding of the bronchovascular markings but no focal acute finding.   Electronically Signed   By: Conchita Paris M.D.   On: 07/21/2014 20:24    PHYSICAL EXAM  patient has chest discomfort. There is family in the room. Head is atraumatic. Sclera and conjunctiva are normal. Lungs reveal no rales. There is no jugulovenous distention. There is no respiratory distress. Cardiac exam reveals S1 and S2. Abdomen is soft. There is no peripheral edema.   TELEMETRY: Normal sinus rhythm.   ASSESSMENT AND PLAN:    NSTEMI (non-ST elevated myocardial infarction)          The patient is having recurrent increased pain. Troponin has gone up to 20.  I carefully talked with her. I've explained to her and her family that we should proceed with cardiac catheterization. She is in agreement.  I've spoken with Dr. Gwenlyn Found who will do the procedure. There is significant increased risk in this patient who is coumadinized. . She is also a Jehovah's Witness.    DIABETES MELLITUS, TYPE II, UNCONTROLLED   HYPERLIPIDEMIA   HYPERTENSION   GERD       Chronic anticoagulation      INR is therapeutic. Coumadin was stopped when the patient was admitted yesterday    Jehovah's Witness (no blood products)     The fact that the patient is Jehovah's Witness Will increase her risk significantly. However it is appropriate to proceed with diagnostic tests.  I was called to see this patient urgently and was having chest discomfort. I reviewed the record rapidly and make clinical decisions. I personally called and the cath lab. I have personally spoken with Dr. Gwenlyn Found about  proceeding with the catheterization.   Dola Argyle 07/22/2014 11:00 AM

## 2014-07-22 NOTE — Progress Notes (Signed)
[facility]  2D Echocardiogram completed.  [place], RDCS 07/22/2014 12:12 PM

## 2014-07-23 DIAGNOSIS — IMO0001 Reserved for inherently not codable concepts without codable children: Secondary | ICD-10-CM

## 2014-07-23 DIAGNOSIS — Z7901 Long term (current) use of anticoagulants: Secondary | ICD-10-CM

## 2014-07-23 DIAGNOSIS — E1165 Type 2 diabetes mellitus with hyperglycemia: Secondary | ICD-10-CM

## 2014-07-23 DIAGNOSIS — I10 Essential (primary) hypertension: Secondary | ICD-10-CM

## 2014-07-23 LAB — BASIC METABOLIC PANEL
ANION GAP: 13 (ref 5–15)
BUN: 13 mg/dL (ref 6–23)
CALCIUM: 8.8 mg/dL (ref 8.4–10.5)
CO2: 24 mEq/L (ref 19–32)
Chloride: 102 mEq/L (ref 96–112)
Creatinine, Ser: 0.76 mg/dL (ref 0.50–1.10)
GFR calc non Af Amer: 81 mL/min — ABNORMAL LOW (ref >90)
Glucose, Bld: 270 mg/dL — ABNORMAL HIGH (ref 70–99)
Potassium: 4.1 mEq/L (ref 3.7–5.3)
Sodium: 139 mEq/L (ref 137–147)

## 2014-07-23 LAB — CBC
HCT: 36.1 % (ref 36.0–46.0)
Hemoglobin: 11.9 g/dL — ABNORMAL LOW (ref 12.0–15.0)
MCH: 27.4 pg (ref 26.0–34.0)
MCHC: 33 g/dL (ref 30.0–36.0)
MCV: 83.2 fL (ref 78.0–100.0)
Platelets: 150 10*3/uL (ref 150–400)
RBC: 4.34 MIL/uL (ref 3.87–5.11)
RDW: 13.4 % (ref 11.5–15.5)
WBC: 6.5 10*3/uL (ref 4.0–10.5)

## 2014-07-23 LAB — GLUCOSE, CAPILLARY
GLUCOSE-CAPILLARY: 361 mg/dL — AB (ref 70–99)
Glucose-Capillary: 357 mg/dL — ABNORMAL HIGH (ref 70–99)
Glucose-Capillary: 274 mg/dL — ABNORMAL HIGH (ref 70–99)
Glucose-Capillary: 344 mg/dL — ABNORMAL HIGH (ref 70–99)
Glucose-Capillary: 253 mg/dL — ABNORMAL HIGH (ref 70–99)

## 2014-07-23 LAB — PROTIME-INR
INR: 2.22 — AB (ref 0.00–1.49)
Prothrombin Time: 24.6 seconds — ABNORMAL HIGH (ref 11.6–15.2)

## 2014-07-23 MED ORDER — WARFARIN SODIUM 7.5 MG PO TABS
7.5000 mg | ORAL_TABLET | Freq: Once | ORAL | Status: AC
  Administered 2014-07-23: 7.5 mg via ORAL
  Filled 2014-07-23: qty 1

## 2014-07-23 MED ORDER — WARFARIN - PHARMACIST DOSING INPATIENT: Freq: Every day | Status: DC

## 2014-07-23 NOTE — Progress Notes (Signed)
UR Completed.  Betzaida Cremeens Jane [phone] 07/23/2014  

## 2014-07-23 NOTE — Progress Notes (Signed)
SUBJECTIVE: The patient is doing well today.  At this time, she denies chest pain, shortness of breath, or any new concerns.  Marland Kitchen amLODipine  10 mg Oral Daily  . anastrozole  1 mg Oral Daily  . aspirin EC  81 mg Oral Daily  . atorvastatin  80 mg Oral q1800  . clopidogrel  75 mg Oral Q breakfast  . cyclobenzaprine  5 mg Oral QHS  . insulin aspart  0-20 Units Subcutaneous TID WC  . insulin aspart  0-5 Units Subcutaneous QHS  . insulin detemir  40 Units Subcutaneous QHS  . lisinopril  40 mg Oral Daily  . metoprolol succinate  50 mg Oral Daily  . mirabegron ER  50 mg Oral Daily  . sodium chloride  3 mL Intravenous Q12H      OBJECTIVE: Physical Exam: Filed Vitals:   07/23/14 0400 07/23/14 0500 07/23/14 0600 07/23/14 0700  BP: 150/39 140/53 130/48 143/51  Pulse: 72 78 69 69  Temp: 98.6 F (37 C)     TempSrc: Oral     Resp: 25 30 26 23   Height:      Weight:  199 lb 11.8 oz (90.6 kg)    SpO2: 95% 95% 97% 93%    Intake/Output Summary (Last 24 hours) at 07/23/14 0858 Last data filed at 07/23/14 0400  Gross per 24 hour  Intake  990.5 ml  Output   1950 ml  Net -959.5 ml    Telemetry reveals sinus rhythm  GEN- The patient is well appearing, alert and oriented x 3 today.   Head- normocephalic, atraumatic Eyes-  Sclera clear, conjunctiva pink Ears- hearing intact Oropharynx- clear Neck- supple, no JVP Lymph- no cervical lymphadenopathy Lungs- Clear to ausculation bilaterally, normal work of breathing Heart- Regular rate and rhythm, no murmurs, rubs or gallops, PMI not laterally displaced GI- soft, NT, ND, + BS Extremities- no clubbing, cyanosis, or edema, R wrist access site looks good Skin- no rash or lesion Psych- euthymic mood, full affect Neuro- strength and sensation are intact  LABS: Basic Metabolic Panel:  Recent Labs  07/22/14 0244 07/23/14 0240  NA 138 139  K 4.1 4.1  CL 101 102  CO2 23 24  GLUCOSE 298* 270*  BUN 19 13  CREATININE 0.78 0.76    CALCIUM 8.9 8.8  MG 1.7  --    Liver Function Tests:  Recent Labs  07/21/14 1830 07/22/14 0244  AST 58* 108*  ALT 53* 52*  ALKPHOS 108 102  BILITOT 0.4 0.3  PROT 7.5 6.9  ALBUMIN 3.6 3.3*    Recent Labs  07/21/14 1830  LIPASE 36   CBC:  Recent Labs  07/21/14 1830 07/22/14 0244 07/23/14 0240  WBC 4.6 6.1 6.5  NEUTROABS 3.2  --   --   HGB 11.3* 11.2* 11.9*  HCT 34.6* 34.0* 36.1  MCV 85.0 85.0 83.2  PLT 178 173 150   Cardiac Enzymes:  Recent Labs  07/22/14 0244 07/22/14 0648 07/22/14 1419  TROPONINI >20.00* >20.00* >20.00*   BNP: No components found with this basename: POCBNP,  D-Dimer: No results found for this basename: DDIMER,  in the last 72 hours Hemoglobin A1C:  Recent Labs  07/22/14 0244  HGBA1C 9.3*   Fasting Lipid Panel:  Recent Labs  07/22/14 0335  CHOL 156  HDL 20*  LDLCALC UNABLE TO CALCULATE IF TRIGLYCERIDE OVER 400 mg/dL  TRIG 525*  CHOLHDL 7.8   Thyroid Function Tests:  Recent Labs  07/22/14 0244  TSH 1.740  Anemia Panel: No results found for this basename: VITAMINB12, FOLATE, FERRITIN, TIBC, IRON, RETICCTPCT,  in the last 72 hours  RADIOLOGY: Dg Chest 2 View  07/21/2014   CLINICAL DATA:  Chest pain  EXAM: CHEST  2 VIEW  COMPARISON:  06/09/2013  FINDINGS: Lungs are hypoaerated with crowding of the bronchovascular markings. Heart size is normal. No focal pulmonary opacity. No pleural effusion. Cholecystectomy clips are noted. No acute osseous finding.  IMPRESSION: Low lung volumes with crowding of the bronchovascular markings but no focal acute finding.   Electronically Signed   By: Conchita Paris M.D.   On: 07/21/2014 20:24    ASSESSMENT AND PLAN:  Principal Problem:   NSTEMI (non-ST elevated myocardial infarction) Active Problems:   DIABETES MELLITUS, TYPE II, UNCONTROLLED   HYPERLIPIDEMIA   HYPERTENSION   GERD   Refusal of blood transfusions as patient is Jehovah's Witness   Chronic anticoagulation  1.  NSTEMI Doing very well s/p PCI Continue current medical therapy  2. Ischemic CM EF 40-45% No indication for lifevest  3. HTN Stable No change required today  4. HL Stable No change required today  5. DM Stable No change required today  6. PTE/DVT (H/o) Resume coumadin at this time  Transfer to telemetry today Cardiac rehab to follow    Thompson Grayer, MD 07/23/2014 8:58 AM

## 2014-07-23 NOTE — Progress Notes (Signed)
ANTICOAGULATION CONSULT NOTE - Initial Consult  Pharmacy Consult for warfarin  Indication: hx DVT/PE  Allergies  Allergen Reactions  . Metformin Diarrhea  . Pioglitazone Other (See Comments)    weight gain  . Vancomycin Rash    Erythematous rash, hands and toes became cyanotic.      Patient Measurements: Height: 5\' 6"  (167.6 cm) Weight: 199 lb 11.8 oz (90.6 kg) IBW/kg (Calculated) : 59.3 Heparin Dosing Weight:   Vital Signs: Temp: 98.2 F (36.8 C) (09/06 0800) Temp src: Oral (09/06 0800) BP: 120/53 mmHg (09/06 1001) Pulse Rate: 78 (09/06 1001)  Labs:  Recent Labs  07/21/14 1830 07/21/14 1842 07/22/14 0244 07/22/14 0648 07/22/14 1419 07/23/14 0240  HGB 11.3*  --  11.2*  --   --  11.9*  HCT 34.6*  --  34.0*  --   --  36.1  PLT 178  --  173  --   --  150  APTT  --   --  49*  --   --   --   LABPROT  --  34.9* 34.9*  --   --  24.6*  INR  --  3.47* 3.47*  --   --  2.22*  CREATININE 0.70  --  0.78  --   --  0.76  TROPONINI 3.39*  --  >20.00* >20.00* >20.00*  --     Estimated Creatinine Clearance: 69.9 ml/min (by C-G formula based on Cr of 0.76).   Medical History: Past Medical History  Diagnosis Date  . Hyperplastic colon polyp   . Diabetes mellitus type II   . Osteoarthritis   . GERD (gastroesophageal reflux disease)   . Osteopenia   . Lupus     of skin see dermatologist frequently  . OA (osteoarthritis of spine)     C-spine  . DVT (deep venous thrombosis)     right leg DVT 05-17-2008  . Leg swelling     left leg 2-10, u/s showed a old clot, has a hypercoag panel,  . History of cardiovascular stress test     10/09 had CP, stress test neg, likely GI (GERD)-09/2006-had CP: stress test (-)  . 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 DVT (deep vein thrombosis) in pregnancy   . DVT of leg (deep venous thrombosis) 07/28/2011  . 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 Allergy   . Clotting disorder     hx of PE and DVT  . C. difficile diarrhea     hx of,    . Skin cancer     skin CA  . Left leg pain 03/12/2013  . Otitis externa 03/19/2013    right  . PONV (postoperative nausea and vomiting)   . Hypertension     pcp  dr Conley Canal   h.p   lebaur  . Breast cancer     left ductal breast ca dx 2003 approx, s/p XRt x 6 weeks, released from oncology (per  patient); dx right breast ca 2014     Assessment: 73 y.o. female presents from OSH with NSTEMI (Trop 3.4-> >20). Pt on Coumadin PTA for DVT/PE 07/2011. INR 3.5 on admission.   Coumadin was being held for cath, with elevated in cardiac enzymes patient taken urgently to cath 9/5, BMS placed and patient loaded with plavix.  INR now down to 2.2 this morning, ok to resume warfarin. No bleeding complications/hematoma noted.  Goal of Therapy:  Heparin level 0.3-0.7 units/ml Monitor platelets by anticoagulation protocol: Yes   Plan:  1) Daily INR 2) Warfarin 7.5mg  tonight  Erin Hearing PharmD., BCPS Clinical Pharmacist Pager [phone] 07/23/2014 11:50 AM

## 2014-07-24 DIAGNOSIS — I2589 Other forms of chronic ischemic heart disease: Secondary | ICD-10-CM

## 2014-07-24 DIAGNOSIS — I214 Non-ST elevation (NSTEMI) myocardial infarction: Secondary | ICD-10-CM

## 2014-07-24 LAB — GLUCOSE, CAPILLARY
GLUCOSE-CAPILLARY: 232 mg/dL — AB (ref 70–99)
Glucose-Capillary: 414 mg/dL — ABNORMAL HIGH (ref 70–99)
Glucose-Capillary: 297 mg/dL — ABNORMAL HIGH (ref 70–99)
Glucose-Capillary: 226 mg/dL — ABNORMAL HIGH (ref 70–99)

## 2014-07-24 LAB — PROTIME-INR
INR: 1.75 — AB (ref 0.00–1.49)
Prothrombin Time: 20.4 seconds — ABNORMAL HIGH (ref 11.6–15.2)

## 2014-07-24 MED ORDER — WARFARIN SODIUM 7.5 MG PO TABS
7.5000 mg | ORAL_TABLET | Freq: Once | ORAL | Status: AC
  Administered 2014-07-24: 7.5 mg via ORAL
  Filled 2014-07-24: qty 1

## 2014-07-24 MED ORDER — HEART ATTACK BOUNCING BOOK
Freq: Once | Status: AC
  Administered 2014-07-24: 14:00:00
  Filled 2014-07-24: qty 1

## 2014-07-24 MED ORDER — INSULIN DETEMIR 100 UNIT/ML ~~LOC~~ SOLN
44.0000 [IU] | Freq: Every day | SUBCUTANEOUS | Status: DC
  Administered 2014-07-24: 44 [IU] via SUBCUTANEOUS
  Filled 2014-07-24 (×2): qty 0.44

## 2014-07-24 MED ORDER — INSULIN ASPART 100 UNIT/ML ~~LOC~~ SOLN
24.0000 [IU] | Freq: Once | SUBCUTANEOUS | Status: AC
  Administered 2014-07-24: 24 [IU] via SUBCUTANEOUS

## 2014-07-24 NOTE — Progress Notes (Signed)
Patient ID: [name], female   DOB: 01/06/1941, 73 y.o.   MRN: [id number]   SUBJECTIVE: BM this morning no chest pain   . amLODipine  10 mg Oral Daily  . anastrozole  1 mg Oral Daily  . aspirin EC  81 mg Oral Daily  . atorvastatin  80 mg Oral q1800  . clopidogrel  75 mg Oral Q breakfast  . cyclobenzaprine  5 mg Oral QHS  . insulin aspart  0-20 Units Subcutaneous TID WC  . insulin aspart  0-5 Units Subcutaneous QHS  . insulin detemir  40 Units Subcutaneous QHS  . lisinopril  40 mg Oral Daily  . metoprolol succinate  50 mg Oral Daily  . mirabegron ER  50 mg Oral Daily  . sodium chloride  3 mL Intravenous Q12H  . Warfarin - Pharmacist Dosing Inpatient   Does not apply q1800      OBJECTIVE: Physical Exam: Filed Vitals:   07/23/14 1001 07/23/14 1212 07/23/14 2113 07/24/14 0613  BP: 120/53 132/44 116/47 136/59  Pulse: 78 73 85 61  Temp:   98.4 F (36.9 C) 98.3 F (36.8 C)  TempSrc:   Oral Oral  Resp:  20 18 18   Height:      Weight:    200 lb 9.9 oz (91 kg)  SpO2:  98% 97% 100%    Intake/Output Summary (Last 24 hours) at 07/24/14 7062 Last data filed at 07/23/14 2300  Gross per 24 hour  Intake    263 ml  Output      0 ml  Net    263 ml    Telemetry reveals sinus rhythm  GEN- The patient is well appearing, alert and oriented x 3 today.   Head- normocephalic, atraumatic Eyes-  Sclera clear, conjunctiva pink Ears- hearing intact Oropharynx- clear Neck- supple, no JVP Lymph- no cervical lymphadenopathy Lungs- Clear to ausculation bilaterally, normal work of breathing Heart- Regular rate and rhythm, no murmurs, rubs or gallops, PMI not laterally displaced GI- soft, NT, ND, + BS Extremities- no clubbing, cyanosis, or edema, R wrist access site looks good Skin- no rash or lesion Psych- euthymic mood, full affect Neuro- strength and sensation are intact  LABS: Basic Metabolic Panel:  Recent Labs  07/22/14 0244 07/23/14 0240  NA 138 139  K 4.1 4.1  CL  101 102  CO2 23 24  GLUCOSE 298* 270*  BUN 19 13  CREATININE 0.78 0.76  CALCIUM 8.9 8.8  MG 1.7  --    Liver Function Tests:  Recent Labs  07/21/14 1830 07/22/14 0244  AST 58* 108*  ALT 53* 52*  ALKPHOS 108 102  BILITOT 0.4 0.3  PROT 7.5 6.9  ALBUMIN 3.6 3.3*    Recent Labs  07/21/14 1830  LIPASE 36   CBC:  Recent Labs  07/21/14 1830 07/22/14 0244 07/23/14 0240  WBC 4.6 6.1 6.5  NEUTROABS 3.2  --   --   HGB 11.3* 11.2* 11.9*  HCT 34.6* 34.0* 36.1  MCV 85.0 85.0 83.2  PLT 178 173 150   Cardiac Enzymes:  Recent Labs  07/22/14 0244 07/22/14 0648 07/22/14 1419  TROPONINI >20.00* >20.00* >20.00*   Hemoglobin A1C:  Recent Labs  07/22/14 0244  HGBA1C 9.3*   Fasting Lipid Panel:  Recent Labs  07/22/14 0335  CHOL 156  HDL 20*  LDLCALC UNABLE TO CALCULATE IF TRIGLYCERIDE OVER 400 mg/dL  TRIG 525*  CHOLHDL 7.8   Thyroid Function Tests:  Recent Labs  07/22/14 0244  TSH 1.740    RADIOLOGY: Dg Chest 2 View  07/21/2014   CLINICAL DATA:  Chest pain  EXAM: CHEST  2 VIEW  COMPARISON:  06/09/2013  FINDINGS: Lungs are hypoaerated with crowding of the bronchovascular markings. Heart size is normal. No focal pulmonary opacity. No pleural effusion. Cholecystectomy clips are noted. No acute osseous finding.  IMPRESSION: Low lung volumes with crowding of the bronchovascular markings but no focal acute finding.   Electronically Signed   By: Conchita Paris M.D.   On: 07/21/2014 20:24    ASSESSMENT AND PLAN:  Principal Problem:   NSTEMI (non-ST elevated myocardial infarction) Active Problems:   DIABETES MELLITUS, TYPE II, UNCONTROLLED   HYPERLIPIDEMIA   HYPERTENSION   GERD   Refusal of blood transfusions as patient is Jehovah's Witness   Chronic anticoagulation  1. NSTEMI Doing very well s/p PCI Continue current medical therapy Plavix for a month then ASA and coumadin   2. Ischemic CM EF 40-45% No indication for lifevest  3. HTN Stable No  change required today  4. HL Stable No change required today  5. DM Stable No change required today Sees Dr Posey Pronto with [facility] She will need to tighten her BS control as outpatient A1c 9  6. PTE/DVT (H/o) INR drifted subRx  Pharmacy dosing Appears to have clotting defect with multiple previous DVT;s and PE  Normal dose had been 7.5 mg weekly and 5 mg weekend  Will give 7.5 mg today   Tentative d/c am if INR over 2.2     Jenkins Rouge, MD 07/24/2014 9:22 AM

## 2014-07-24 NOTE — Progress Notes (Addendum)
[place] for warfarin  Indication: hx DVT/PE  Allergies  Allergen Reactions  . Metformin Diarrhea  . Pioglitazone Other (See Comments)    weight gain  . Vancomycin Rash    Erythematous rash, hands and toes became cyanotic.      Patient Measurements: Height: 5\' 6"  (167.6 cm) Weight: 200 lb 9.9 oz (91 kg) IBW/kg (Calculated) : 59.3 Heparin Dosing Weight:   Vital Signs: Temp: 98.3 F (36.8 C) (09/07 0613) Temp src: Oral (09/07 0613) BP: 136/59 mmHg (09/07 0613) Pulse Rate: 61 (09/07 0613)  Labs:  Recent Labs  07/21/14 1830  07/22/14 0244 07/22/14 0648 07/22/14 1419 07/23/14 0240 07/24/14 0215  HGB 11.3*  --  11.2*  --   --  11.9*  --   HCT 34.6*  --  34.0*  --   --  36.1  --   PLT 178  --  173  --   --  150  --   APTT  --   --  49*  --   --   --   --   LABPROT  --   < > 34.9*  --   --  24.6* 20.4*  INR  --   < > 3.47*  --   --  2.22* 1.75*  CREATININE 0.70  --  0.78  --   --  0.76  --   TROPONINI 3.39*  --  >20.00* >20.00* >20.00*  --   --   < > = values in this interval not displayed.  Estimated Creatinine Clearance: 70.1 ml/min (by C-G formula based on Cr of 0.76).   Medical History: Past Medical History  Diagnosis Date  . Hyperplastic colon polyp   . Diabetes mellitus type II   . Osteoarthritis   . GERD (gastroesophageal reflux disease)   . Osteopenia   . Lupus     of skin see dermatologist frequently  . OA (osteoarthritis of spine)     C-spine  . DVT (deep venous thrombosis)     right leg DVT 05-17-2008  . Leg swelling     left leg 2-10, u/s showed a old clot, has a hypercoag panel,  . History of cardiovascular stress test     10/09 had CP, stress test neg, likely GI (GERD)-09/2006-had CP: stress test (-)  . 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 DVT (deep vein thrombosis) in pregnancy   . DVT of leg (deep venous thrombosis) 07/28/2011  . 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 Allergy   . Clotting  disorder     hx of PE and DVT  . C. difficile diarrhea     hx of,   . Skin cancer     skin CA  . Left leg pain 03/12/2013  . Otitis externa 03/19/2013    right  . PONV (postoperative nausea and vomiting)   . Hypertension     pcp  dr Conley Canal   h.p   [facility]  . Breast cancer     left ductal breast ca dx 2003 approx, s/p XRt x 6 weeks, released from oncology (per  patient); dx right breast ca 2014     Assessment: 73 y.o. female presents from OSH with NSTEMI (Trop 3.4-> >20). Pt on Coumadin PTA for DVT/PE 07/2011. INR 3.5 on admission.   Coumadin was being held for cath, with elevated in cardiac enzymes patient taken urgently to cath 9/5, BMS placed and patient loaded with plavix. Warfarin restarted 9/6.  INR  now down to 1.75 this morning, after dose held over weekend d/t cath. No bleeding complications/hematoma noted.  Goal of Therapy:  Heparin level 0.3-0.7 units/ml Monitor platelets by anticoagulation protocol: Yes   Plan:  1) Daily INR 2) Warfarin 7.5mg  tonight  Erin Hearing PharmD., BCPS Clinical Pharmacist Pager [phone] 07/24/2014 9:27 AM

## 2014-07-25 ENCOUNTER — Telehealth: Payer: Self-pay | Admitting: Family

## 2014-07-25 ENCOUNTER — Ambulatory Visit (INDEPENDENT_AMBULATORY_CARE_PROVIDER_SITE_OTHER): Payer: Medicare Other | Admitting: General Surgery

## 2014-07-25 ENCOUNTER — Encounter (HOSPITAL_COMMUNITY): Payer: Self-pay | Admitting: Physician Assistant

## 2014-07-25 ENCOUNTER — Telehealth: Payer: Self-pay | Admitting: Nurse Practitioner

## 2014-07-25 DIAGNOSIS — I872 Venous insufficiency (chronic) (peripheral): Secondary | ICD-10-CM

## 2014-07-25 LAB — PROTIME-INR
INR: 1.81 — ABNORMAL HIGH (ref 0.00–1.49)
Prothrombin Time: 21 seconds — ABNORMAL HIGH (ref 11.6–15.2)

## 2014-07-25 LAB — GLUCOSE, CAPILLARY
GLUCOSE-CAPILLARY: 248 mg/dL — AB (ref 70–99)
GLUCOSE-CAPILLARY: 317 mg/dL — AB (ref 70–99)
Glucose-Capillary: 334 mg/dL — ABNORMAL HIGH (ref 70–99)

## 2014-07-25 LAB — POCT ACTIVATED CLOTTING TIME: ACTIVATED CLOTTING TIME: 512 s

## 2014-07-25 MED ORDER — ASPIRIN 81 MG PO TBEC: 81.0000 mg | DELAYED_RELEASE_TABLET | Freq: Every day | ORAL | Status: DC

## 2014-07-25 MED ORDER — ATORVASTATIN CALCIUM 80 MG PO TABS: 80.0000 mg | ORAL_TABLET | Freq: Every evening | ORAL | Status: DC

## 2014-07-25 MED ORDER — CLOPIDOGREL BISULFATE 75 MG PO TABS: 75.0000 mg | ORAL_TABLET | Freq: Every day | ORAL | Status: DC

## 2014-07-25 MED ORDER — NITROGLYCERIN 0.4 MG SL SUBL: 0.4000 mg | SUBLINGUAL_TABLET | SUBLINGUAL | Status: AC | PRN

## 2014-07-25 MED ORDER — WARFARIN SODIUM 10 MG PO TABS
10.0000 mg | ORAL_TABLET | Freq: Once | ORAL | Status: AC
  Administered 2014-07-25: 10 mg via ORAL
  Filled 2014-07-25: qty 1

## 2014-07-25 MED ORDER — WARFARIN SODIUM 5 MG PO TABS: ORAL_TABLET | ORAL | Status: DC

## 2014-07-25 MED FILL — Sodium Chloride IV Soln 0.9%: INTRAVENOUS | Qty: 50 | Status: AC

## 2014-07-25 NOTE — Telephone Encounter (Signed)
Left message for patient to return my call.

## 2014-07-25 NOTE — Progress Notes (Signed)
CARDIAC REHAB PHASE I   PRE:  Rate/Rhythm: 81 SR  BP:  Supine:   Sitting: 106/48  Standing:    SaO2:   MODE:  Ambulation: 550 ft   POST:  Rate/Rhythm: 91  BP:  Supine:   Sitting: 130/61  Standing:    SaO2: 100%RA 1015-1130 Pt walked 550 ft on RA with rolling walker without CP. Tolerated well. Has rollator at home to use. MI education done with pt. She needed review as she had difficulty with teach back questions. Encouraged pt to review materials given. Gave stent card and stressed importance of plavix. Pt has HGA1C of 9.3 so reviewed carb counting. Pt has not used it before. Discussed CRP 2 but pt states has copay for each class that she would not be able to afford. Encouraged walking to help with lowering sugars and helping cholesterol(has high triglycerides). Reviewed NTG use twice.   Graylon Good, RN BSN  07/25/2014 11:24 AM

## 2014-07-25 NOTE — Progress Notes (Signed)
Inpatient Diabetes Program Recommendations  AACE/ADA: New Consensus Statement on Inpatient Glycemic Control (2013)  Target Ranges:  Prepandial:   less than 140 mg/dL      Peak postprandial:   less than 180 mg/dL (1-2 hours)      Critically ill patients:  140 - 180 mg/dL   Reason for Visit: Hyperglycemia  Diabetes history: DM2 Outpatient Diabetes medications: Levemir 40 units QHS and Regular 16-30 units s/s Current orders for Inpatient glycemic control: Novolog moderate tidwc and hs and Levemir 44 units QHS  Results for ROSARIA, KUBIN (MRN [id number]) as of 07/25/2014 13:27  Ref. Range 07/24/2014 11:53 07/24/2014 16:21 07/24/2014 21:12 07/25/2014 07:27 07/25/2014 11:49  Glucose-Capillary Latest Range: 70-99 mg/dL 414 (H) 297 (H) 226 (H) 248 (H) 334 (H)  Results for VEONA, BITTMAN (MRN [id number]) as of 07/25/2014 13:27  Ref. Range 07/22/2014 02:44  Hemoglobin A1C Latest Range: <5.7 % 9.3 (H)   Post-prandial blood sugars elevated.  Needs tighter glucose control at home.  Inpatient Diabetes Program Recommendations Insulin - Basal: If FBS >180 mg/dL, increase Levemir  Insulin - Meal Coverage: Add meal coverage insulin - Novolog 8 units tidwc for meal coverage insulin. Titrate until CBGs < 180 mg/dL HgbA1C: 9.3% - uncontrolled Outpatient Referral: OP Diabetes Education for uncontrolled blood sugars  Note: Will follow while inpatient. Thank you. Lorenda Peck, RD, LDN, CDE Inpatient Diabetes Coordinator [phone]

## 2014-07-25 NOTE — Telephone Encounter (Signed)
Please contact pt to arrange a hospital follow up appointment in next 1-2 weeks.

## 2014-07-25 NOTE — Progress Notes (Signed)
[place] for warfarin  Indication: hx DVT/PE  Allergies  Allergen Reactions  . Metformin Diarrhea  . Pioglitazone Other (See Comments)    weight gain  . Vancomycin Rash    Erythematous rash, hands and toes became cyanotic.      Patient Measurements: Height: 5\' 6"  (167.6 cm) Weight: 200 lb 9.9 oz (91 kg) IBW/kg (Calculated) : 59.3   Vital Signs: Temp: 99 F (37.2 C) (09/08 0529) Temp src: Oral (09/08 0529) BP: 121/51 mmHg (09/08 0529) Pulse Rate: 78 (09/08 0529)  Labs:  Recent Labs  07/22/14 1419 07/23/14 0240 07/24/14 0215 07/25/14 0732  HGB  --  11.9*  --   --   HCT  --  36.1  --   --   PLT  --  150  --   --   LABPROT  --  24.6* 20.4* 21.0*  INR  --  2.22* 1.75* 1.81*  CREATININE  --  0.76  --   --   TROPONINI >20.00*  --   --   --     Estimated Creatinine Clearance: 70.1 ml/min (by C-G formula based on Cr of 0.76).   Assessment: 73 y.o. female presents from OSH with NSTEMI (Trop 3.4>>20). Pt on Coumadin PTA for DVT/PE 07/2011. INR 3.5 on admission- was being held for cath, but was then taken urgently as enzymes were >20. Warfarin resumed 9/6. Patient is also on ASA and Plavix for at least 4 weeks, then likely to stop Plavix per cards.  INR this morning below goal at 1.81. No bleeding noted. Not currently overlapping as concerned for risk of bleed in patient who cannot receive blood products.  Goal of Therapy:  INR 2-3 Monitor platelets by anticoagulation protocol: Yes   Plan:  1. Warfarin 10mg  po x1 tonight 2. If decision made to discharge today, recommend on 9/9 to resume home dosing of 7.5mg  Mon-Fri and 5mg  on Saturdays and Sundays. Recommend INR check on 9/11 AM. 3. Daily PT/INR while in the hospital 4. CBC tmrw if still here  Cai Anfinson D. Makynlie Rossini, PharmD, BCPS Clinical Pharmacist Pager: [phone] 07/25/2014 9:47 AM

## 2014-07-25 NOTE — Discharge Summary (Signed)
[name] 07/25/2014

## 2014-07-25 NOTE — Progress Notes (Signed)
Patient Name: [name] Date of Encounter: 07/25/2014     Principal Problem:   NSTEMI (non-ST elevated myocardial infarction) Active Problems:   DIABETES MELLITUS, TYPE II, UNCONTROLLED   HYPERLIPIDEMIA   HYPERTENSION   GERD   Refusal of blood transfusions as patient is Jehovah's Witness   Chronic anticoagulation    SUBJECTIVE  Denies any CP or SOB. Feeling good. Did not sleep much last night, however no discomfort. States she has been on coumadin for 2 yrs since having blood clot  CURRENT MEDS . amLODipine  10 mg Oral Daily  . anastrozole  1 mg Oral Daily  . aspirin EC  81 mg Oral Daily  . atorvastatin  80 mg Oral q1800  . clopidogrel  75 mg Oral Q breakfast  . cyclobenzaprine  5 mg Oral QHS  . insulin aspart  0-20 Units Subcutaneous TID WC  . insulin aspart  0-5 Units Subcutaneous QHS  . insulin detemir  44 Units Subcutaneous QHS  . lisinopril  40 mg Oral Daily  . metoprolol succinate  50 mg Oral Daily  . mirabegron ER  50 mg Oral Daily  . sodium chloride  3 mL Intravenous Q12H  . Warfarin - Pharmacist Dosing Inpatient   Does not apply q1800    OBJECTIVE  Filed Vitals:   07/24/14 1400 07/24/14 2000 07/24/14 2037 07/25/14 0529  BP: 104/45  117/52 121/51  Pulse: 81 80 78 78  Temp: 98.5 F (36.9 C)  98.1 F (36.7 C) 99 F (37.2 C)  TempSrc: Oral  Oral Oral  Resp: 16  18 18   Height:      Weight:    200 lb 9.9 oz (91 kg)  SpO2: 98%  98% 95%    Intake/Output Summary (Last 24 hours) at 07/25/14 0801 Last data filed at 07/24/14 2200  Gross per 24 hour  Intake    600 ml  Output      0 ml  Net    600 ml   Filed Weights   07/23/14 0500 07/24/14 0613 07/25/14 0529  Weight: 199 lb 11.8 oz (90.6 kg) 200 lb 9.9 oz (91 kg) 200 lb 9.9 oz (91 kg)    PHYSICAL EXAM  General: Pleasant, NAD. Neuro: Alert and oriented X 3. Moves all extremities spontaneously. Psych: Normal affect. HEENT:  Normal  Neck: Supple without bruits or JVD. Lungs:  Resp regular  and unlabored, CTA. Heart: RRR no s3, s4, or murmurs. Abdomen: Soft, non-tender, non-distended, BS + x 4.  Extremities: No clubbing, cyanosis or edema. DP/PT/Radials 2+ and equal bilaterally.  Accessory Clinical Findings  CBC  Recent Labs  07/23/14 0240  WBC 6.5  HGB 11.9*  HCT 36.1  MCV 83.2  PLT 856   Basic Metabolic Panel  Recent Labs  07/23/14 0240  NA 139  K 4.1  CL 102  CO2 24  GLUCOSE 270*  BUN 13  CREATININE 0.76  CALCIUM 8.8   Cardiac Enzymes  Recent Labs  07/22/14 1419  TROPONINI >20.00*    TELE NSR with HR 70-90s, no significant ventricular ectopy    ECG  No new EKG Previous EKG 07/23/2014 NSR with ST elevation in V2-V3, diffuse TWI in inferior lead and anterior leads  Echocardiogram  07/22/2014 LV EF: 40% - 45%  ------------------------------------------------------------------- Indications: Chest pain 786.51.  ------------------------------------------------------------------- History: PMH: Myocardial infarction. Risk factors: Hypertension. Diabetes mellitus. Dyslipidemia.  ------------------------------------------------------------------- Study Conclusions  - Left ventricle: The cavity size was normal. Wall thickness was increased in a pattern  of mild LVH. Systolic function was mildly to moderately reduced. The estimated ejection fraction was in the range of 40% to 45%. There is akinesis of the apical myocardium. Doppler parameters are consistent with abnormal left ventricular relaxation (grade 1 diastolic dysfunction). - Aortic valve: Valve mobility was restricted. - Mitral valve: Calcified annulus. - Left atrium: The atrium was mildly dilated.  Impressions:  - Large area of apical akinesis with overall mild to moderate LV dysfunction; trace MR.     Radiology/Studies  Dg Chest 2 View  07/21/2014   CLINICAL DATA:  Chest pain  EXAM: CHEST  2 VIEW  COMPARISON:  06/09/2013  FINDINGS: Lungs are hypoaerated with crowding of the  bronchovascular markings. Heart size is normal. No focal pulmonary opacity. No pleural effusion. Cholecystectomy clips are noted. No acute osseous finding.  IMPRESSION: Low lung volumes with crowding of the bronchovascular markings but no focal acute finding.   Electronically Signed   By: Conchita Paris M.D.   On: 07/21/2014 20:24    ASSESSMENT AND PLAN  1. NSTEMI  - cath 07/22/2014 EF 35-40%, apical dyskinesia and distal anterior wall severe hypokinesis, 95% segmental mid to distal LAD treated with BMS (continue plavix for 4 weeks per Dr. Gwenlyn Found)  - triple therapy for now, d/c plavix after 1 month to continue ASA and coumadin  - plan to discharge once INR therapeutic, will discuss with Dr Meda Coffee regarding discharge tomorrow vs today. No bridging with lovenox or heparin due to triple therapy, jehovah's witness and risk of bleeding  2. Ischemic cardiomyopathy  - cath 07/22/2014 EF 35-40%  - Echo 07/22/2013 EF 41-28%, grade 1 diastolic dysfunction, large area of apical akinesis  - no current need for lifevest based on echo result  3. DM 4. HTN 5. Dyslipidemia 6. Prior PE on chronic anticoagulation 7. Jehovah's Witness (no blood product)   Signed, Woodward Ku Pager: [phone]   The patient was seen, examined and discussed with Almyra Deforest, PA-C and I agree with the above.   73 year old female admitted with NSTEMI on 9/5 (Troponin > 20), s/p BMS to distal LAD. On ASA, plavix x 1 month and Coumadine for prior PE. No heparin bridge as high risk of bleeding. Since we are not doing anything more in the hospital than we would do at home, the patient can be discharged today with an early INR check up on an outpatient bases.  There is residual ischemic cardiomyopathy, no acute CHF, we will continue high dose atorvastatin, metoprolol and lisinopril.  BP controlled.   [name] 07/25/2014

## 2014-07-25 NOTE — Discharge Summary (Signed)
Discharge Summary   Patient ID: [name] MRN: [id number], DOB/AGE: [birth year] 73 y.o. Admit date: 07/21/2014 D/C date:     07/25/2014  Primary Care Provider: Nance Pear., NP Primary Cardiologist: New to [name] this admission [facility]: [name], [address], [place], [place] [address], Phone [phone]    Primary Discharge Diagnoses:  1. NSTEMI/CAD - cath 07/22/2014 with 95% segmental mid to distal LAD treated with BMS - on triple therapy for now, plan to d/c plavix after 1 month & continue ASA and Coumadin  2. Ischemic cardiomyopathy  - cath 07/22/2014 EF 35-40%  - Echo 07/22/2013 EF 36-14%, grade 1 diastolic dysfunction, large area of apical akinesis  - no current need for lifevest based on echo result  3. DM  4. HTN  5. Dyslipidemia  6. 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 Jehovah's Witness (no blood product) 8. Mild transaminitis, felt likely due to MI  Secondary Discharge Diagnoses:  1. Hyperplastic colon polyp   2. Osteoarthritis   3. GERD (gastroesophageal reflux disease)   4. Osteopenia   5. Lupus of skin see dermatologist frequently   6. OA (osteoarthritis of spine) C-spine   7. Melanoma in situ of back 06/23/2011  8. Pulmonary embolus and infarction 08/05/2011   9. Allergy   10. Clotting disorder hx of PE and DVT   11. H/o C. difficile diarrhea    12. Skin cancer   13. PONV (postoperative nausea and vomiting)   14. Hypertension  15. Breast cancer left ductal breast ca dx 2003 approx, s/p XRt x 6 weeks, released from oncology (per patient); dx right breast ca 2014 (in remission)  [name] Course: [name] is a 73 yo woman with PMH of T2DM, several prior DVTs and PE 9/12 on chronic anticoagulation with warfarin therapy (INR 3.5 on admission), left and right breast cancer, refusal of blood products as patient is Jehovah's witness, hypertension and dyslipidemia with last known EF > 55% by 9/12  outside [name] report who presented to [name] 07/21/2014 with chest pain. She developed exertional dyspnea while walking on day of admission with continued pain at rest leading to EMS call and admission to [name]. She localized the pain to her substernal/epigastric area with some radiation to her jaw. She denied previous chest pain but her daughter reported a bout of chest pain 2 weeks ago. She received aspirin and SL NTG in the ambulance with minimal improvement in symptoms. It almost completely resolved with morphine. No syncope, tobacco use or family hx of CAD. Initial labs showed h/h 11.3/34.6, plt 178, wbc 4.6, na 137, K 3.9, bun/cr 21/0.7, glucose 288, inr 3.5, troponin 3.4. CXR increased vascular markings. ECG with NSR, poor anterior R waves. She was not initially placed on heparin due to supratherapeutic anticoagulation. Coumadin was held. She was started on statin and continued on aspirin. On 07/22/14 she had worsening chest pain and troponin went up to 20. EKG showed decreased anterior R-wave without marked ST elevation. Given ongoing pain Dr. Ron [name] recommended more urgent catheterization rather than waiting over the long holiday weekend, accepting the risk of bleeding given continued therapeutic INR. SHe underwent cath demonstrating: 1. Left main; normal  2. LAD; 95% segmental mid-to distal -> treated with BMS 3. Left circumflex; nondominant and normal.  4. Right coronary artery; dominant and normal  5. Left ventriculography; RAO left ventriculogram was performed using 25 mL of Visipaque dye at 12 mL/second. The overall LVEF  estimated 35-40 % with wall motion abnormalities notable for apical dyskinesia and distal anterior wall severe hypokinesia. It was recommended that the patient continue ASA and Plavix for 4 weeks, the discontinue Plavix and continue aspirin and Coumadin. Lifevest was initially considered, however, f/u echo demonstrated improved EF of 40-45% with akinesis  of the apical myocardium; grade 1 diastolic dysfunction, trave MR. Her INR had drifted down post-procedurally and Coumadin was restarted. She was not bridged with heparin or Lovenox due to concern for risk of bleed in patient who cannot receive blood products (in the setting of triple therapy). Today she feels better. Dr. Meda [name] did not feel she had to remain inpatient while INR continues to rise to therapeutic level since she is not being bridged. Dr. Meda [name] has seen and examined the patient today and feels she is stable for discharge. She was instructed to f/u PCP for uncontrolled DM - A1C 9.3. During inpatient stay her levemir was temporarily increased from 40->44units but she was also on a different sliding scale here than she typically uses at home. We will d/c her on her home regimen and ask her to discuss with PCP. She did have mild LFT elevation felt due to her MI. Since statin was initiated, would consider outpatient f/u of labs. Might start with repeat LFTs at her followup visit then repeat in 6-8 weeks.  Per pharmacy recommendations, they recommend for her to take Coumadin 10mg  tonight, then resume home dosing of 7.5mg  Mon-Fri and 5mg  on Saturdays and Sundays. Recommend INR check on 9/11 AM.  She was first loaded with Plavix on 9/5, and received the usual dose on 9/6, 9/7, and today (9/8) thus we have written for 25 more days.    Discharge Vitals: Blood pressure 106/48, pulse 78, temperature 99 F (37.2 C), temperature source Oral, resp. rate 18, height 5\' 6"  (1.676 m), weight 200 lb 9.9 oz (91 kg), SpO2 95.00%.  Labs: Lab Results  Component Value Date   WBC 6.5 07/23/2014   HGB 11.9* 07/23/2014   HCT 36.1 07/23/2014   MCV 83.2 07/23/2014   PLT 150 07/23/2014     Recent Labs Lab 07/22/14 0244 07/23/14 0240  NA 138 139  K 4.1 4.1  CL 101 102  CO2 23 24  BUN 19 13  CREATININE 0.78 0.76  CALCIUM 8.9 8.8  PROT 6.9  --   BILITOT 0.3  --   ALKPHOS 102  --   ALT 52*  --   AST 108*  --     GLUCOSE 298* 270*    Recent Labs  07/22/14 1419  TROPONINI >20.00*   Lab Results  Component Value Date   CHOL 156 07/22/2014   HDL 20* 07/22/2014   LDLCALC UNABLE TO CALCULATE IF TRIGLYCERIDE OVER 400 mg/dL 07/22/2014   TRIG 525* 07/22/2014     Diagnostic Studies/Procedures   2D Echo 07/22/14 - Left ventricle: The cavity size was normal. Wall thickness was increased in a pattern of mild LVH. Systolic function was mildly to moderately reduced. The estimated ejection fraction was in the range of 40% to 45%. There is akinesis of the apical myocardium. Doppler parameters are consistent with abnormal left ventricular relaxation (grade 1 diastolic dysfunction). - Aortic valve: Valve mobility was restricted. - Mitral valve: Calcified annulus. - Left atrium: The atrium was mildly dilated. Impressions: - Large area of apical akinesis with overall mild to moderate LV dysfunction; trace MR.  Cath 07/22/14 CARDIAC CATHETERIZATION  History obtained from chart review.Ms. Eastmond is a  73 year old female admitted yesterday with chest pain. She has no prior cardiac history. She does have a history of a DVT and pulmonary embolus on Coumadin anticoagulation with an INR in the mid 3 range. She is also a Jehovah's Witness. Her EKG showed subtle changes in her troponin was significantly elevated with ongoing chest pain. Therefore, it was decided to bring her to the cath lab to define her anatomy and potentially intervene.  PROCEDURE DESCRIPTION:  The patient was brought to the second floor [facility] cath lab in the postabsorptive state. She was not premedicated . Her right wristwas prepped and shaved in usual sterile fashion. Xylocaine 1% was used for local anesthesia. A 5 French sheath was inserted into the right radial artery using standard Seldinger technique. The patient received 4000 units of heparin intravenously. A 5 [place] TIG catheter and pigtail catheters were used for selective coronary  angiography and left ventriculography respectively. Omnipaque dye was used for the entirety of the case. Retrograde aortic, left ventricular and pullback pressures were recorded.  HEMODYNAMICS:  AO SYSTOLIC/AO DIASTOLIC: 387/56  LV SYSTOLIC/LV DIASTOLIC: 433/29  ANGIOGRAPHIC RESULTS:  1. Left main; normal  2. LAD; 95% segmental mid-to distal  3. Left circumflex; nondominant and normal.  4. Right coronary artery; dominant and normal  5. Left ventriculography; RAO left ventriculogram was performed using  25 mL of Visipaque dye at 12 mL/second. The overall LVEF estimated  35-40 % With wall motion abnormalities notable for apical dyskinesia and distal anterior wall severe hypokinesia.  IMPRESSION:Ms. Walth has high grade segmental mid to distal LAD stenosis responsible for acute coronary syndrome. We'll proceed with PCI and stenting using Angiomax, Plavix and bare-metal stent.  Procedure description: The patient received Angiomax bolus with an ACT of 512. Total contrast administered the patient was 160 cc. Using a 6 [place] XB LAD 3-1/2 cm guide catheter along with a 1/190 cm pleural water guidewire and a 2 mm x 12 mm balloon predilatation was performed. Following this stenting was performed with a 2.25 mm x 23 mm long mini vision bare metal stent deployed at 16 atmospheres (2.55 mm) resulting in reduction of a 95% segmental stenosis to 0% residual with TIMI-3 flow no dissection. The patient tolerated the procedure well. The guidewire and catheter were removed. The sheath was removed and a TR band was placed on the right wrist to achieve patent hemostasis.  Final impression: Successful PCI and stenting of mid to distal LAD in the setting of non-STEMI with bare-metal stent, Angiomax and Plavix. The patient's INR is already supra-therapeutic at 3.5. Coumadin is on hold. Plans will be to restart Coumadin once INR falls below 2.5 and continue aspirin 81 mg and Plavix. The Plavix can be discontinued after 4  weeks given that she has a bare-metal stent. A 2-D echocardiogram is pending. The decision for placement of a LIFEVEST will depend on her ejection fraction.  Lorretta Harp MD, [name]  07/22/2014  12:43 PM    Dg Chest 2 View 07/21/2014   CLINICAL DATA:  Chest pain  EXAM: CHEST  2 VIEW  COMPARISON:  06/09/2013  FINDINGS: Lungs are hypoaerated with crowding of the bronchovascular markings. Heart size is normal. No focal pulmonary opacity. No pleural effusion. Cholecystectomy clips are noted. No acute osseous finding.  IMPRESSION: Low lung volumes with crowding of the bronchovascular markings but no focal acute finding.   Electronically Signed   By: Conchita Paris M.D.   On: 07/21/2014 20:24    Discharge Medications  Current Discharge Medication List    START taking these medications   Details  aspirin EC 81 MG EC tablet Take 1 tablet (81 mg total) by mouth daily. Qty: 30 tablet, Refills: 3    atorvastatin (LIPITOR) 80 MG tablet Take 1 tablet (80 mg total) by mouth every evening. Qty: 30 tablet, Refills: 6    clopidogrel (PLAVIX) 75 MG tablet Take 1 tablet (75 mg total) by mouth daily. Qty: 25 tablet, Refills: 0    nitroGLYCERIN (NITROSTAT) 0.4 MG SL tablet Place 1 tablet (0.4 mg total) under the tongue every 5 (five) minutes as needed for chest pain (up to 3 doses - if no relief, call 911). Qty: 25 tablet, Refills: 3      CONTINUE these medications which have CHANGED   Details  warfarin (COUMADIN) 5 MG tablet Take 2 tablets (10mg ) by mouth tonight (07/25/14). Tomorrow (07/26/14), restart usual dose of 1 and a half tablets (7.5mg ) Monday through Friday and 1 tablet (5mg ) on Saturdays and Sundays.      CONTINUE these medications which have NOT CHANGED   Details  acetaminophen (TYLENOL) 325 MG tablet Take 650 mg by mouth every 6 (six) hours as needed (for pain).    amLODipine (NORVASC) 10 MG tablet Take 10 mg by mouth daily.    anastrozole (ARIMIDEX) 1 MG tablet Take 1 mg by mouth  daily.    B Complex-C (SUPER B COMPLEX PO) Take 1 tablet by mouth daily.    Calcium Carbonate-Vitamin D (CALCIUM-VITAMIN D) 600-200 MG-UNIT CAPS Take 1 tablet by mouth daily.     cyclobenzaprine (FLEXERIL) 5 MG tablet Take 5 mg by mouth at bedtime.    furosemide (LASIX) 20 MG tablet Take 20 mg by mouth daily as needed (for swelling of legs).    Glucosamine-Chondroitin (GLUCOSAMINE CHONDR COMPLEX PO) Take 1 capsule by mouth daily.    insulin detemir (LEVEMIR) 100 UNIT/ML injection Inject 40 Units into the skin at bedtime.    insulin regular (NOVOLIN R,HUMULIN R) 100 units/mL injection Inject into the skin 3 (three) times daily before meals. Sliding scale 16-30  units    lisinopril (PRINIVIL,ZESTRIL) 40 MG tablet Take 40 mg by mouth daily.    metoprolol succinate (TOPROL-XL) 50 MG 24 hr tablet Take 50 mg by mouth daily. Take with or immediately following a meal.    mirabegron ER (MYRBETRIQ) 50 MG TB24 tablet Take 50 mg by mouth daily. Started 8/26. (7 day package) Started 2nd package (7 days each) on 9/2.    traMADol (ULTRAM) 50 MG tablet Take 50-100 mg by mouth every 6 (six) hours as needed (for pain).        Disposition   The patient will be discharged in stable condition to home. Discharge Instructions   Diet - low sodium heart healthy    Complete by:  As directed      Discharge instructions    Complete by:  As directed   One of your heart tests showed weakness of the heart muscle this admission. This may make you more susceptible to weight gain from fluid retention, which can lead to symptoms that we call heart failure. Special instructions: 1. Follow a low-salt diet and watch your fluid intake. In general, you should not be taking in more than 2 liters of fluid per day (no more than 8 glasses per day). Some patients are restricted to less than 1.5 liters of fluid per day (no more than 6 glasses per day). This includes sources of water in  foods like soup, [name], tea, milk,  etc. 2. Weigh yourself on the same scale at same time of day and keep a log. 3. Call your doctor: (Anytime you feel any of the following symptoms)  - 3-4 pound weight gain in 1-2 days or 2 pounds overnight  - Shortness of breath, with or without a dry hacking cough  - Swelling in the hands, feet or stomach  - If you have to sleep on extra pillows at night in order to breathe  IT IS IMPORTANT TO LET YOUR DOCTOR KNOW EARLY ON IF YOU ARE HAVING SYMPTOMS SO WE CAN HELP YOU!     Increase activity slowly    Complete by:  As directed   No driving for 1 week. No lifting over 10 lbs for 2 weeks. No sexual activity for 2 weeks. Keep procedure site clean & dry. If you notice increased pain, swelling, bleeding or pus, call/return!  You may shower, but no soaking baths/hot tubs/pools for 1 week.   The plan is to: - continue Plavix for a total of 4 weeks then STOP - continue aspirin for now (this might eventually be stopped at a later date) - continue Coumadin          Follow-up Information   Follow up with [facility] ([facility]). (07/28/14 at 1:40pm for Coumadin check)    Contact information:   [address] [place], [place] [address] Phone [phone] Fax [phone]      Follow up with Kelsey Pear., NP. (Your diabetes is not controlled. Your A1C was 9.3 which is too high. Please call your primary care doctor today to discuss a plan for getting it under better control.)    Specialty:  Internal Medicine   Contact information:   [address] [place] [address] [phone]       Follow up with Truitt Merle, NP. ([place] 08/04/14 at 2:30pm)    Specialty:  Nurse Practitioner   Contact information:   [place] [place] [address] [phone]         Duration of Discharge Encounter: Greater than 30 minutes including physician and PA time.  Signed, Melina Copa PA-C 07/25/2014, 11:32 AM

## 2014-07-25 NOTE — Care Management Note (Signed)
    Page 1 of 1   07/25/2014     11:27:27 AM CARE MANAGEMENT NOTE 07/25/2014  Patient:  [name], [name]   Account Number:  [id number]  Date Initiated:  07/25/2014  Documentation initiated by:  GRAVES-BIGELOW,Yaeko Fazekas  Subjective/Objective Assessment:   Pt admitted for cp-stemi.     Action/Plan:   No needs from CM at this time.   Anticipated DC Date:  07/26/2014   Anticipated DC Plan:  [place]  CM consult      Choice offered to / List presented to:             Status of service:  Completed, signed off Medicare Important Message given?  YES (If response is "NO", the following Medicare IM given date fields will be blank) Date Medicare IM given:  07/24/2014 Medicare IM given by:  GRAVES-BIGELOW,Aldwin Micalizzi Date Additional Medicare IM given:   Additional Medicare IM given by:    Discharge Disposition:  HOME/SELF CARE  Per UR Regulation:  Reviewed for med. necessity/level of care/duration of stay  If discussed at [place] of Stay Meetings, dates discussed:    Comments:

## 2014-07-25 NOTE — Telephone Encounter (Signed)
New message      TCM appt on 08-04-14 at 2:30 per Dayna.

## 2014-07-26 ENCOUNTER — Telehealth: Payer: Self-pay | Admitting: Family

## 2014-07-26 ENCOUNTER — Telehealth: Payer: Self-pay | Admitting: Nurse Practitioner

## 2014-07-26 NOTE — Telephone Encounter (Signed)
Patient contacted regarding discharge from [facility]   on  July 25, 2014.  Patient understands to follow up with provider --Truitt Merle, NP on August 04, 2014 at 2:30. Office address and phone number provided to pt.  Patient understands discharge instructions? yes Patient understands medications and regiment? yes Patient understands to bring all medications to this visit? yes

## 2014-07-26 NOTE — Telephone Encounter (Signed)
Appointment scheduled.

## 2014-07-26 NOTE — Telephone Encounter (Signed)
°  Patient has questions about medications. Please call and advise.

## 2014-07-26 NOTE — Telephone Encounter (Signed)
Yes, I am aware and have reviewed her hospital records. Please advise pt that she should take amlodipine once daily.  She should schedule follow up with her endocrinologist due to uncontrolled diabetes. I will see her as scheduled.

## 2014-07-26 NOTE — Telephone Encounter (Signed)
Patient called back wanting to know how to take amlodopine?

## 2014-07-26 NOTE — Telephone Encounter (Signed)
Pt wanted to make [facility] aware that she had a heart attack over the weekend, states the hospital informed her that they would send over all of her information but she wanted to make sure that Lenna Sciara was aware.

## 2014-07-26 NOTE — Telephone Encounter (Signed)
Notified pt and she voices understanding. 

## 2014-07-27 NOTE — Addendum Note (Signed)
Addended by: Rudene Anda on: 07/27/2014 05:24 PM   Modules accepted: Medications

## 2014-07-27 NOTE — Telephone Encounter (Signed)
Called patient to complete TCM call.  Pt stated that she was currently in the bathroom and would like for me to call her back after lunch.  Will call back later to complete.

## 2014-07-27 NOTE — Telephone Encounter (Signed)
Left message for call back.

## 2014-07-27 NOTE — Telephone Encounter (Addendum)
Admit date: 07/21/2014  Discharge date: 07/25/2014   Reason for admission:  NSTEMI/CAD; Ischemic cardiomyopathy    Transition Care Management Follow-up Telephone Call  How have you been since you were released from the hospital?  Pt states that she has been tired since she's been discharged.  Takes her a while to shower.  She also c/o back pain.  Rated 8/10.  Described as a constant ache. Pain is relieved by resting. Worsens when patient tries to pick up items, make her bed, or bend.  She sleeps in a recliner and has been for the past 4 months.     Do you understand why you were in the hospital? yes   Do you understand the discharge instrcutions? Yes, but discharge instructions were again reviewed with patient.    Items Reviewed:  Medications reviewed: yes; pt has not started the ASA, unsure of whether or not she's taking atorvastatin, and has not picked up nitroglycerin from pharmacy.    Allergies reviewed: yes  Dietary changes reviewed: yes; low sodium heart healthy   Referrals reviewed: yes; appointments reviewed with patient   Functional Questionnaire:   Activities of Daily Living (ADLs):   She states they are independent in the following: ambulation, bathing and hygiene, feeding, continence, grooming, toileting and dressing States they require assistance with the following: needs assistance with setting up medications    Any transportation issues/concerns?: yes; because patient is unable to drive, she has limited transportation.  Daughter helps some.     Any patient concerns? Pt is concerned that she may not be able to make it to the coumadin clinic or her appointment with Truitt Merle due to lack of transportation.  Pt plans to try to arrange transportation.     Confirmed importance and date/time of follow-up visits scheduled: yes   Confirmed with patient if condition begins to worsen call PCP or go to the ER.  Patient was given the Call-a-Nurse line [phone]:  yes   Hospital follow up appointment scheduled for 08/07/14 @ 1:15 pm.

## 2014-08-04 ENCOUNTER — Ambulatory Visit (INDEPENDENT_AMBULATORY_CARE_PROVIDER_SITE_OTHER): Payer: Medicare Other | Admitting: Nurse Practitioner

## 2014-08-04 ENCOUNTER — Encounter: Payer: Self-pay | Admitting: Nurse Practitioner

## 2014-08-04 ENCOUNTER — Ambulatory Visit: Payer: Medicare Other | Admitting: Family

## 2014-08-04 VITALS — BP 130/60 | HR 68 | Ht 66.5 in | Wt 204.1 lb

## 2014-08-04 DIAGNOSIS — I255 Ischemic cardiomyopathy: Secondary | ICD-10-CM

## 2014-08-04 DIAGNOSIS — I214 Non-ST elevation (NSTEMI) myocardial infarction: Secondary | ICD-10-CM

## 2014-08-04 DIAGNOSIS — I2589 Other forms of chronic ischemic heart disease: Secondary | ICD-10-CM

## 2014-08-04 LAB — BASIC METABOLIC PANEL
BUN: 16 mg/dL (ref 6–23)
CO2: 29 mEq/L (ref 19–32)
Calcium: 9.8 mg/dL (ref 8.4–10.5)
Chloride: 102 mEq/L (ref 96–112)
Creatinine, Ser: 0.8 mg/dL (ref 0.4–1.2)
GFR: 72.4 mL/min (ref >60.00)
Glucose, Bld: 163 mg/dL — ABNORMAL HIGH (ref 70–99)
Potassium: 4.2 mEq/L (ref 3.5–5.1)
Sodium: 138 mEq/L (ref 135–145)

## 2014-08-04 LAB — CBC
HCT: 33.9 % — ABNORMAL LOW (ref 36.0–46.0)
Hemoglobin: 11.1 g/dL — ABNORMAL LOW (ref 12.0–15.0)
MCHC: 32.7 g/dL (ref 30.0–36.0)
MCV: 84.4 fl (ref 78.0–100.0)
Platelets: 216 10*3/uL (ref 150.0–400.0)
RBC: 4.01 Mil/uL (ref 3.87–5.11)
RDW: 13.8 % (ref 11.5–15.5)
WBC: 6.2 10*3/uL (ref 4.0–10.5)

## 2014-08-04 MED ORDER — METOPROLOL SUCCINATE ER 50 MG PO TB24: 25.0000 mg | ORAL_TABLET | Freq: Every day | ORAL | Status: DC

## 2014-08-04 MED ORDER — METOPROLOL SUCCINATE ER 25 MG PO TB24: 25.0000 mg | ORAL_TABLET | Freq: Every day | ORAL | Status: DC

## 2014-08-04 NOTE — Patient Instructions (Addendum)
Stay on your current medicines but cut the Toprol in half - you will be on just 25 mg a day - this should help with your fatigue. I have sent the RX for the 25 mg to the drugstore.  Stop your Plavix when your current bottle is finished.   See Dr. Stanford Breed in 3 weeks in [facility]  We will check labs today.  Call the [place] office at [phone] if you have any questions, problems or concerns.

## 2014-08-04 NOTE — Progress Notes (Signed)
[name] Date of Birth: 1941-07-23 Medical Record #[id number]  History of Present Illness: [name] is seen back today for a post hospital visit  - seen for Dr. Ron Parker. She is a 73 year old female with lupus, OA, GERD, past melanoma, HTN, DM, pulmonary embolus with infarct in 2012, breast cancer and HLD. She is a Sales promotion account executive witness. She is on chronic anticoagulation. Prior EF of 55% in 2012.   Most recently presented to the hospital with NSTEMI/CAD - was cathed - and had BMS to the LAD - on triple therapy and the plan is to d/c Plavix after 1 month. EF is 35 to 40% per cath and 40 to 45% per echo. Based on the ER per echo - she did not require Life Vest.   Comes in today. Here alone. Doing ok. Walking some daily. No more chest pain. She complains of fatigue - thinks it from the beta blocker. Not short of breath. Not dizzy or lightheaded. She is on high dose Norvasc. Taking her Lasix every day. Tolerating her medicines. Some bruising. Not dizzy or lightheaded. Looks like she has had chronic issues with swelling. Restricting her salt now.   Current Outpatient Prescriptions  Medication Sig Dispense Refill  . acetaminophen (TYLENOL) 325 MG tablet Take 650 mg by mouth every 6 (six) hours as needed (for pain).      Marland Kitchen amLODipine (NORVASC) 10 MG tablet Take 10 mg by mouth daily.      Marland Kitchen anastrozole (ARIMIDEX) 1 MG tablet Take 1 mg by mouth daily.      Marland Kitchen aspirin EC 81 MG EC tablet Take 1 tablet (81 mg total) by mouth daily.  30 tablet  3  . atorvastatin (LIPITOR) 80 MG tablet Take 1 tablet (80 mg total) by mouth every evening.  30 tablet  6  . B Complex-C (SUPER B COMPLEX PO) Take 1 tablet by mouth daily.      . Calcium Carbonate-Vitamin D (CALCIUM-VITAMIN D) 600-200 MG-UNIT CAPS Take 1 tablet by mouth daily.       . clopidogrel (PLAVIX) 75 MG tablet Take 1 tablet (75 mg total) by mouth daily.  25 tablet  0  . cyclobenzaprine (FLEXERIL) 5 MG tablet Take 5 mg by mouth at bedtime.      .  furosemide (LASIX) 20 MG tablet Take 20 mg by mouth daily as needed (for swelling of legs).       . Glucosamine-Chondroitin (GLUCOSAMINE CHONDR COMPLEX PO) Take 1 capsule by mouth daily.      . insulin detemir (LEVEMIR) 100 UNIT/ML injection Inject 40 Units into the skin at bedtime.      . insulin regular (NOVOLIN R,HUMULIN R) 100 units/mL injection Inject into the skin 3 (three) times daily before meals. Sliding scale 16-30  units      . lisinopril (PRINIVIL,ZESTRIL) 40 MG tablet Take 40 mg by mouth daily.      . mirabegron ER (MYRBETRIQ) 50 MG TB24 tablet Take 50 mg by mouth daily. Started 8/26. (7 day package) Started 2nd package (7 days each) on 9/2.      . nitroGLYCERIN (NITROSTAT) 0.4 MG SL tablet Place 1 tablet (0.4 mg total) under the tongue every 5 (five) minutes as needed for chest pain (up to 3 doses - if no relief, call 911).  25 tablet  3  . traMADol (ULTRAM) 50 MG tablet Take 50-100 mg by mouth every 6 (six) hours as needed (for pain).      Marland Kitchen warfarin (  COUMADIN) 5 MG tablet Take 2 tablets (10mg ) by mouth tonight (07/25/14). Tomorrow (07/26/14), restart usual dose of 1 and a half tablets (7.5mg ) Monday through Friday and 1 tablet (5mg ) on Saturdays and Sundays.      . metoprolol succinate (TOPROL XL) 25 MG 24 hr tablet Take 1 tablet (25 mg total) by mouth daily.  30 tablet  6   No current facility-administered medications for this visit.   Facility-Administered Medications Ordered in Other Visits  Medication Dose Route Frequency Provider Last Rate Last Dose  . silver sulfADIAZINE (SILVADENE) 1 % cream   Topical Daily Lora Paula, MD        Allergies  Allergen Reactions  . Metformin Diarrhea  . Pioglitazone Other (See Comments)    weight gain  . Vancomycin Rash    Erythematous rash, hands and toes became cyanotic.      Past Medical History  Diagnosis Date  . Hyperplastic colon polyp   . Diabetes mellitus type II   . Osteoarthritis   . GERD (gastroesophageal reflux  disease)   . Osteopenia   . Lupus     of skin see dermatologist frequently  . OA (osteoarthritis of spine)     C-spine  . DVT (deep venous thrombosis)     a. right leg DVT 05-17-2008. b. h/o leg swelling of left leg 2-10, u/s showed a old clot. c. another DVT reported 07/2011.  . 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 Allergy   . Clotting disorder     hx of PE and DVT  . C. difficile diarrhea     hx of,   . Skin cancer     skin CA  . PONV (postoperative nausea and vomiting)   . Hypertension   . Breast cancer     left ductal breast ca dx 2003 approx, s/p XRt x 6 weeks, released from oncology (per  patient); dx right breast ca 2014 (in remission)  . Refusal of blood transfusions as patient is Jehovah's Witness   . CAD (coronary artery disease)     a. NSTEMI 07/2014: s/p BMS to mid-distal LAD.  . Ischemic cardiomyopathy     a. cath 07/22/2014 EF 35-40%; Echo 07/22/2013 EF 22-29%, grade 1 diastolic dysfunction, large area of apical akinesis.  Marland Kitchen HTN (hypertension)   . Dyslipidemia   . Transaminitis     a. Mild during 07/2014 admission ? due to MI    Past Surgical History  Procedure Laterality Date  . Cholecystectomy    . Breast biopsy      left breast and right breast  . Breast lumpectomy      left breast-ductal carcinoma in situ  . Appendectomy    . Skin cancer excision      from back   . Breast lumpectomy with needle localization and axillary sentinel lymph node bx Right 06/17/2013    Procedure: BREAST LUMPECTOMY WITH NEEDLE LOCALIZATION AND AXILLARY SENTINEL LYMPH NODE BX;  Surgeon: Merrie Roof, MD;  Location: [facility];  Service: General;  Laterality: Right;  . Irrigation and debridement abscess Right 02/18/2014    Procedure: IRRIGATION AND DEBRIDEMENT RIGHT AXILLARY ABSCESS;  Surgeon: Leighton Ruff, MD;  Location: [facility];  Service: General;  Laterality: Right;    History  Smoking status  . Never Smoker   Smokeless tobacco  . Never  Used    History  Alcohol Use No    Family History  Problem Relation  Age of Onset  . Diabetes Mother   . Diabetes    . Heart attack Neg Hx   . Colon cancer Neg Hx   . Breast cancer Neg Hx   . Esophageal cancer Neg Hx   . Rectal cancer Neg Hx   . Stomach cancer Neg Hx   . Cancer Father     melanoma  . Diabetes Brother   . Other Brother     amputation    Review of Systems: The review of systems is per the HPI.  All other systems were reviewed and are negative.  Physical Exam: BP 130/60  Pulse 68  Ht 5' 6.5" (1.689 m)  Wt 204 lb 1.9 oz (92.588 kg)  BMI 32.46 kg/m2 Patient is very pleasant and in no acute distress. She is obese. Skin is warm and dry. Color is normal.  HEENT is unremarkable. Normocephalic/atraumatic. PERRL. Sclera are nonicteric. Neck is supple. No masses. No JVD. Lungs are clear. Cardiac exam shows a regular rate and rhythm. Abdomen is soft. Extremities are with chronic edema and discoloration. Gait and ROM are intact. No gross neurologic deficits noted.  Wt Readings from Last 3 Encounters:  08/04/14 204 lb 1.9 oz (92.588 kg)  07/25/14 200 lb 9.9 oz (91 kg)  07/25/14 200 lb 9.9 oz (91 kg)    LABORATORY DATA/PROCEDURES:  Lab Results  Component Value Date   WBC 6.5 07/23/2014   HGB 11.9* 07/23/2014   HCT 36.1 07/23/2014   PLT 150 07/23/2014   GLUCOSE 270* 07/23/2014   CHOL 156 07/22/2014   TRIG 525* 07/22/2014   HDL 20* 07/22/2014   LDLCALC UNABLE TO CALCULATE IF TRIGLYCERIDE OVER 400 mg/dL 07/22/2014   ALT 52* 07/22/2014   AST 108* 07/22/2014   NA 139 07/23/2014   K 4.1 07/23/2014   CL 102 07/23/2014   CREATININE 0.76 07/23/2014   BUN 13 07/23/2014   CO2 24 07/23/2014   TSH 1.740 07/22/2014   INR 1.81* 07/25/2014   HGBA1C 9.3* 07/22/2014   MICROALBUR 5.29* 07/22/2013    BNP (last 3 results)  Recent Labs  07/21/14 1830 07/22/14 0244  PROBNP 35.7 355.3*   Echo Study Conclusions  - Left ventricle: The cavity size was normal. Wall thickness was increased in a pattern of  mild LVH. Systolic function was mildly to moderately reduced. The estimated ejection fraction was in the range of 40% to 45%. There is akinesis of the apical myocardium. Doppler parameters are consistent with abnormal left ventricular relaxation (grade 1 diastolic dysfunction). - Aortic valve: Valve mobility was restricted. - Mitral valve: Calcified annulus. - Left atrium: The atrium was mildly dilated.  Impressions:  - Large area of apical akinesis with overall mild to moderate LV dysfunction; trace MR.  CARDIAC CATHETERIZATION  History obtained from chart review.[name] is a 73 year old female admitted yesterday with chest pain. She has no prior cardiac history. She does have a history of a DVT and pulmonary embolus on Coumadin anticoagulation with an INR in the mid 3 range. She is also a Jehovah's Witness. Her EKG showed subtle changes in her troponin was significantly elevated with ongoing chest pain. Therefore, it was decided to bring her to the cath lab to define her anatomy and potentially intervene.  PROCEDURE DESCRIPTION:  The patient was brought to the second floor [facility] cath lab in the postabsorptive state. She was not premedicated . Her right wristwas prepped and shaved in usual sterile fashion. Xylocaine 1% was used for local anesthesia. A  5 French sheath was inserted into the right radial artery using standard Seldinger technique. The patient received 4000 units of heparin intravenously. A 5 [place] TIG catheter and pigtail catheters were used for selective coronary angiography and left ventriculography respectively. Omnipaque dye was used for the entirety of the case. Retrograde aortic, left ventricular and pullback pressures were recorded.  HEMODYNAMICS:  AO SYSTOLIC/AO DIASTOLIC: 818/29  LV SYSTOLIC/LV DIASTOLIC: 937/16  ANGIOGRAPHIC RESULTS:  1. Left main; normal  2. LAD; 95% segmental mid-to distal  3. Left circumflex; nondominant and normal.  4. Right  coronary artery; dominant and normal  5. Left ventriculography; RAO left ventriculogram was performed using  25 mL of Visipaque dye at 12 mL/second. The overall LVEF estimated  35-40 % With wall motion abnormalities notable for apical dyskinesia and distal anterior wall severe hypokinesia.  IMPRESSION:[name] has high grade segmental mid to distal LAD stenosis responsible for acute coronary syndrome. We'll proceed with PCI and stenting using Angiomax, Plavix and bare-metal stent.  Procedure description: The patient received Angiomax bolus with an ACT of 512. Total contrast administered the patient was 160 cc. Using a 6 [place] XB LAD 3-1/2 cm guide catheter along with a 1/190 cm pleural water guidewire and a 2 mm x 12 mm balloon predilatation was performed. Following this stenting was performed with a 2.25 mm x 23 mm long mini vision bare metal stent deployed at 16 atmospheres (2.55 mm) resulting in reduction of a 95% segmental stenosis to 0% residual with TIMI-3 flow no dissection. The patient tolerated the procedure well. The guidewire and catheter were removed. The sheath was removed and a TR band was placed on the right wrist to achieve patent hemostasis.  Final impression: Successful PCI and stenting of mid to distal LAD in the setting of non-STEMI with bare-metal stent, Angiomax and Plavix. The patient's INR is already supra-therapeutic at 3.5. Coumadin is on hold. Plans will be to restart Coumadin once INR falls below 2.5 and continue aspirin 81 mg and Plavix. The Plavix can be discontinued after 4 weeks given that she has a bare-metal stent. A 2-D echocardiogram is pending. The decision for placement of a LIFEVEST will depend on her ejection fraction.  Lorretta Harp MD, [facility]  07/22/2014   Lab Results  Component Value Date   CKTOTAL 51 11/06/2009   CKMB 1.3 11/06/2009   TROPONINI >20.00* 07/22/2014     Assessment / Plan: 1. Recent NSTEMI with BMS to the LAD - doing well clinically.  Check follow up labs today. Not able to afford cardiac rehab.   2. Ischemic CM - EF 40 to 45% by echo and 35 to 40% by cath - would titrate medicines and plan for repeat echo in 3 months - she is not tolerating her metoprolol and wants to cut back. On max dose of ACE. May have to consider aldactone.   3. HTN -  Recheck by me is just 110/60.   4. HLD -  On statin  5. IDDM - poor control based on A1C  Needs baseline labs today. See back in 3 weeks - she would like to establish with Dr. Stanford Breed in [facility]. Will try to arrange. May need to look at her dose of Norvasc on return. This is probably impacting her swelling.   Patient is agreeable to this plan and will call if any problems develop in the interim.   Burtis Junes, RN, [facility] [address] [place], [place]  [address] [phone]   

## 2014-08-07 ENCOUNTER — Encounter: Payer: Self-pay | Admitting: Family

## 2014-08-07 ENCOUNTER — Ambulatory Visit (INDEPENDENT_AMBULATORY_CARE_PROVIDER_SITE_OTHER): Payer: Medicare Other | Admitting: Family

## 2014-08-07 VITALS — BP 120/56 | HR 79 | Temp 98.4°F | Resp 16 | Ht 66.5 in | Wt 207.0 lb

## 2014-08-07 DIAGNOSIS — Z7901 Long term (current) use of anticoagulants: Secondary | ICD-10-CM

## 2014-08-07 DIAGNOSIS — E1165 Type 2 diabetes mellitus with hyperglycemia: Secondary | ICD-10-CM

## 2014-08-07 DIAGNOSIS — IMO0001 Reserved for inherently not codable concepts without codable children: Secondary | ICD-10-CM

## 2014-08-07 DIAGNOSIS — R7989 Other specified abnormal findings of blood chemistry: Secondary | ICD-10-CM

## 2014-08-07 DIAGNOSIS — E785 Hyperlipidemia, unspecified: Secondary | ICD-10-CM

## 2014-08-07 DIAGNOSIS — R945 Abnormal results of liver function studies: Secondary | ICD-10-CM

## 2014-08-07 DIAGNOSIS — I214 Non-ST elevation (NSTEMI) myocardial infarction: Secondary | ICD-10-CM

## 2014-08-07 LAB — HEPATIC FUNCTION PANEL
ALT: 43 U/L — AB (ref 0–35)
AST: 38 U/L — ABNORMAL HIGH (ref 0–37)
Albumin: 3.7 g/dL (ref 3.5–5.2)
Alkaline Phosphatase: 122 U/L — ABNORMAL HIGH (ref 39–117)
Bilirubin, Direct: 0 mg/dL (ref 0.0–0.3)
TOTAL PROTEIN: 7.6 g/dL (ref 6.0–8.3)
Total Bilirubin: 0.7 mg/dL (ref 0.2–1.2)

## 2014-08-07 NOTE — Progress Notes (Signed)
Pre visit review using our clinic review tool, if applicable. No additional management support is needed unless otherwise documented below in the visit note/SLS  

## 2014-08-07 NOTE — Progress Notes (Signed)
Subjective:    Patient ID: [name], female    DOB: [birth year], 73 y.o.   MRN: [id number]  HPI  [name] is a 73 yr old female who presents today for hospital follow up.  She was admitted 07/22/14- 07/25/14 to [facility].  Records are reviewed.  Pt ruled in for MI.  Had cath on 07/22/14 with LAD and had a bare metal stent.  She is currently on plavix ASA and Coumadin.  Plant per cardiology is to d/c plavix in 1 month.  Echo noted LVEF 40-45%. Reports one episode of chest pain since returning home, was brief and resolved without nitro.    Uncontrolled DM- she follows with Dr. Posey Pronto.  She reports that sugar this Am was 102, taking levemir 40 units.  She continues sliding scale 16-30 units TID AC meals.   Lab Results  Component Value Date   HGBA1C 9.3* 07/22/2014   Hyperlipidemia- she was started on statin.  Denies myalgia.    Chronic anticoagulation-  She went to coumadin clinic last week and was told that her INR was 3.9 and her coumadin was adjusted.  She is on lifelong coum   Review of Systems See HPI  Past Medical History  Diagnosis Date  . Hyperplastic colon polyp   . Diabetes mellitus type II   . Osteoarthritis   . GERD (gastroesophageal reflux disease)   . Osteopenia   . Lupus     of skin see dermatologist frequently  . OA (osteoarthritis of spine)     C-spine  . DVT (deep venous thrombosis)     a. right leg DVT 05-17-2008. b. h/o leg swelling of left leg 2-10, u/s showed a old clot. c. another DVT reported 07/2011.  . 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 Allergy   . Clotting disorder     hx of PE and DVT  . C. difficile diarrhea     hx of,   . Skin cancer     skin CA  . PONV (postoperative nausea and vomiting)   . Hypertension   . Breast cancer     left ductal breast ca dx 2003 approx, s/p XRt x 6 weeks, released from oncology (per  patient); dx right breast ca 2014 (in remission)  . Refusal of blood  transfusions as patient is Jehovah's Witness   . CAD (coronary artery disease)     a. NSTEMI 07/2014: s/p BMS to mid-distal LAD.  . Ischemic cardiomyopathy     a. cath 07/22/2014 EF 35-40%; Echo 07/22/2013 EF 09-73%, grade 1 diastolic dysfunction, large area of apical akinesis.  Marland Kitchen HTN (hypertension)   . Dyslipidemia   . Transaminitis     a. Mild during 07/2014 admission ? due to MI    History   Social History  . Marital Status: Widowed    Spouse Name: N/A    Number of Children: 3  . Years of Education: N/A   Occupational History  . Retired    Social History Main Topics  . Smoking status: Never Smoker   . Smokeless tobacco: Never Used  . Alcohol Use: No  . Drug Use: No  . Sexual Activity: Not Currently   Other Topics Concern  . Not on file   Social History Narrative   Single-widow    3 children , 1 in [place] (son, daughter - [place])    [place] to [employer] 2002 from [place], [place]  Alcohol Use - no      tobacco-- never     Illicit Drug Use - no    Pt is Jehovah's Witness-No blood products   Daughter - [name]           Past Surgical History  Procedure Laterality Date  . Cholecystectomy    . Breast biopsy      left breast and right breast  . Breast lumpectomy      left breast-ductal carcinoma in situ  . Appendectomy    . Skin cancer excision      from back   . Breast lumpectomy with needle localization and axillary sentinel lymph node bx Right 06/17/2013    Procedure: BREAST LUMPECTOMY WITH NEEDLE LOCALIZATION AND AXILLARY SENTINEL LYMPH NODE BX;  Surgeon: Merrie Roof, MD;  Location: [facility];  Service: General;  Laterality: Right;  . Irrigation and debridement abscess Right 02/18/2014    Procedure: IRRIGATION AND DEBRIDEMENT RIGHT AXILLARY ABSCESS;  Surgeon: Leighton Ruff, MD;  Location: [facility];  Service: General;  Laterality: Right;    Family History  Problem Relation Age of Onset  . Diabetes Mother   . Diabetes    . Heart attack Neg Hx   . Colon cancer Neg Hx    . Breast cancer Neg Hx   . Esophageal cancer Neg Hx   . Rectal cancer Neg Hx   . Stomach cancer Neg Hx   . Cancer Father     melanoma  . Diabetes Brother   . Other Brother     amputation    Allergies  Allergen Reactions  . Metformin Diarrhea  . Pioglitazone Other (See Comments)    weight gain  . Vancomycin Rash    Erythematous rash, hands and toes became cyanotic.      Current Outpatient Prescriptions on File Prior to Visit  Medication Sig Dispense Refill  . acetaminophen (TYLENOL) 325 MG tablet Take 650 mg by mouth every 6 (six) hours as needed (for pain).      Marland Kitchen amLODipine (NORVASC) 10 MG tablet Take 10 mg by mouth daily.      Marland Kitchen anastrozole (ARIMIDEX) 1 MG tablet Take 1 mg by mouth daily.      Marland Kitchen aspirin EC 81 MG EC tablet Take 1 tablet (81 mg total) by mouth daily.  30 tablet  3  . atorvastatin (LIPITOR) 80 MG tablet Take 1 tablet (80 mg total) by mouth every evening.  30 tablet  6  . B Complex-C (SUPER B COMPLEX PO) Take 1 tablet by mouth daily.      . Calcium Carbonate-Vitamin D (CALCIUM-VITAMIN D) 600-200 MG-UNIT CAPS Take 1 tablet by mouth daily.       . clopidogrel (PLAVIX) 75 MG tablet Take 1 tablet (75 mg total) by mouth daily.  25 tablet  0  . cyclobenzaprine (FLEXERIL) 5 MG tablet Take 5 mg by mouth at bedtime.      . furosemide (LASIX) 20 MG tablet Take 20 mg by mouth daily as needed (for swelling of legs).       . Glucosamine-Chondroitin (GLUCOSAMINE CHONDR COMPLEX PO) Take 1 capsule by mouth daily.      . insulin detemir (LEVEMIR) 100 UNIT/ML injection Inject 45 Units into the skin at bedtime.       . insulin regular (NOVOLIN R,HUMULIN R) 100 units/mL injection Inject into the skin 3 (three) times daily before meals. Sliding scale 16-30  Units: Humulin      . lisinopril (PRINIVIL,ZESTRIL)  40 MG tablet Take 40 mg by mouth daily.      . metoprolol succinate (TOPROL XL) 25 MG 24 hr tablet Take 1 tablet (25 mg total) by mouth daily.  30 tablet  6  . mirabegron ER  (MYRBETRIQ) 50 MG TB24 tablet Take 50 mg by mouth daily. Started 8/26. (7 day package) Started 2nd package (7 days each) on 9/2.      . nitroGLYCERIN (NITROSTAT) 0.4 MG SL tablet Place 1 tablet (0.4 mg total) under the tongue every 5 (five) minutes as needed for chest pain (up to 3 doses - if no relief, call 911).  25 tablet  3  . traMADol (ULTRAM) 50 MG tablet Take 50-100 mg by mouth every 6 (six) hours as needed (for pain).      Marland Kitchen warfarin (COUMADIN) 5 MG tablet Take 2 tablets (10mg ) by mouth tonight (07/25/14). Tomorrow (07/26/14), restart usual dose of 1 and a half tablets (7.5mg ) Monday through Friday and 1 tablet (5mg ) on Saturdays and Sundays.       Current Facility-Administered Medications on File Prior to Visit  Medication Dose Route Frequency Provider Last Rate Last Dose  . silver sulfADIAZINE (SILVADENE) 1 % cream   Topical Daily Lora Paula, MD        BP 120/56  Pulse 79  Temp(Src) 98.4 F (36.9 C) (Oral)  Resp 16  Ht 5' 6.5" (1.689 m)  Wt 207 lb (93.895 kg)  BMI 32.91 kg/m2  SpO2 100%       Objective:   Physical Exam  Constitutional: She is oriented to person, place, and time. She appears well-developed and well-nourished. No distress.  Cardiovascular: Normal rate and regular rhythm.   No murmur heard. Pulmonary/Chest: Effort normal and breath sounds normal. No respiratory distress. She has no wheezes. She has no rales. She exhibits no tenderness.  Abdominal: Soft. Bowel sounds are normal.  Musculoskeletal:  2- 3+ bilateral LE edema.  Neurological: She is alert and oriented to person, place, and time.  Skin:  Chronic venous stasis changes bilateral shins  Psychiatric: She has a normal mood and affect. Her behavior is normal. Judgment and thought content normal.          Assessment & Plan:

## 2014-08-07 NOTE — Assessment & Plan Note (Signed)
Management per coumadin clinic. She understands to stop plavix after 30 days.

## 2014-08-07 NOTE — Patient Instructions (Signed)
Please complete lab work prior to leaving. Increase levemir from 40 to 45 units. Keep upcoming appointment with Dr. Posey Pronto and Dr.Crenshaw. Follow up with [place] in 3 months, sooner if problems or concerns.

## 2014-08-07 NOTE — Assessment & Plan Note (Signed)
S/p Bare metal stent placement. Clinically stable.  Management per cardiology.

## 2014-08-07 NOTE — Assessment & Plan Note (Signed)
Uncontrolled. Will increase levemir from 40 units to 45 units. I have advised her to keep her upcoming appointment with endo.

## 2014-08-07 NOTE — Assessment & Plan Note (Signed)
Recently started on statin by cardiology. LFT's were elevated in the hospital. Will repeat LFT's today to make sure that they are stable.

## 2014-08-13 ENCOUNTER — Other Ambulatory Visit: Payer: Self-pay | Admitting: Family

## 2014-08-14 NOTE — Telephone Encounter (Signed)
Medication Detail      Disp Refills Start End     metoprolol succinate (TOPROL XL) 25 MG 24 hr tablet 30 tablet 6 08/04/2014     Sig - Route: Take 1 tablet (25 mg total) by mouth daily. - Oral    E-Prescribing Status: Receipt confirmed by pharmacy (08/04/2014 3:15 PM EDT)    Pharmacy    [facility] [place] [place] [address]

## 2014-08-19 ENCOUNTER — Other Ambulatory Visit: Payer: Self-pay | Admitting: Family

## 2014-08-21 ENCOUNTER — Telehealth: Payer: Self-pay | Admitting: Family

## 2014-08-21 NOTE — Telephone Encounter (Signed)
Note from prescriber [Hospital] to pharmacy: Order Comments: Note to pharmacy: the patient will only be on Plavix for a total of 4 weeks, including the doses she received in the hospital (which is why prescription is for 25 tablets). Patient informed, understood & agreed/SLS

## 2014-08-21 NOTE — Telephone Encounter (Signed)
What type of pain is the patient having?

## 2014-08-21 NOTE — Telephone Encounter (Signed)
Caller name: [place] Relation to pt: self Call back number: [phone] Pharmacy:  Reason for call:   Patient has questions regarding plavix

## 2014-08-21 NOTE — Telephone Encounter (Signed)
eScribe request from [facility] for refill on Tramadol 50 mg Last filled - 08.18.15, #40x0 [Unknown amount on 09.04.15 in Hospital on Med list] Last AEX - 09.21.15 Next AEX - 3 Mths. Please Advise on refills/SLS

## 2014-08-21 NOTE — Telephone Encounter (Signed)
Pt states Rx is for her Osteoarthritis and that she was instructed to take [2] Tramadol + [2] Tylenol together, as Tramadol was not enough by itsef/SLS Please Advise.

## 2014-08-22 ENCOUNTER — Telehealth: Payer: Self-pay

## 2014-08-22 ENCOUNTER — Other Ambulatory Visit: Payer: Self-pay | Admitting: Family

## 2014-08-22 NOTE — Telephone Encounter (Signed)
Left a message for call back.  Called patient regarding diabetic eye exam.  When patient calls back please ask:  Have you had a recent (2014-2015) eye exam?    Date of Exam?  Where?    

## 2014-08-22 NOTE — Telephone Encounter (Signed)
OK to send 40 tabs zero refills

## 2014-08-23 NOTE — Telephone Encounter (Signed)
Rx request faxed to pharmacy; patient informed via phone message/SLS

## 2014-08-28 NOTE — Telephone Encounter (Signed)
[name] [phone] (M)  Kelsey called back I ask her when her last eye exam was and she said sometime the first of the this year. Dr Leticia Penna in [facility]. I ask her to call them and ask if they could fax visit to [name].

## 2014-08-28 NOTE — Telephone Encounter (Signed)
Had last exam in 2014.   Pt said she is not going to call her eye doc, we should do that

## 2014-08-28 NOTE — Telephone Encounter (Signed)
Noted! Thank you

## 2014-08-29 NOTE — Telephone Encounter (Signed)
Office called. Awaiting fax.

## 2014-08-30 ENCOUNTER — Ambulatory Visit (INDEPENDENT_AMBULATORY_CARE_PROVIDER_SITE_OTHER): Payer: Medicare Other | Admitting: Cardiology

## 2014-08-30 ENCOUNTER — Telehealth: Payer: Self-pay | Admitting: *Deleted

## 2014-08-30 ENCOUNTER — Encounter: Payer: Self-pay | Admitting: Cardiology

## 2014-08-30 VITALS — BP 128/62 | HR 60 | Ht 66.0 in | Wt 204.0 lb

## 2014-08-30 DIAGNOSIS — I2510 Atherosclerotic heart disease of native coronary artery without angina pectoris: Secondary | ICD-10-CM | POA: Insufficient documentation

## 2014-08-30 DIAGNOSIS — Z7901 Long term (current) use of anticoagulants: Secondary | ICD-10-CM

## 2014-08-30 DIAGNOSIS — I255 Ischemic cardiomyopathy: Secondary | ICD-10-CM

## 2014-08-30 DIAGNOSIS — E785 Hyperlipidemia, unspecified: Secondary | ICD-10-CM

## 2014-08-30 DIAGNOSIS — I2583 Coronary atherosclerosis due to lipid rich plaque: Secondary | ICD-10-CM

## 2014-08-30 MED ORDER — PRAVASTATIN SODIUM 40 MG PO TABS: 40.0000 mg | ORAL_TABLET | Freq: Every evening | ORAL | Status: DC

## 2014-08-30 NOTE — Patient Instructions (Signed)
Your physician wants you to follow-up in: [place] will receive a reminder letter in the mail two months in advance. If you don't receive a letter, please call our office to schedule the follow-up appointment.   STOP ASPIRIN  STOP ATORVASTATIN   START PRAVASTATIN 40 MG ONCE DAILY  Your physician recommends that you return for lab work in: [place] OFFICE= DO NOT EAT PRIOR TO LAB WORK

## 2014-08-30 NOTE — Telephone Encounter (Signed)
Fax received

## 2014-08-30 NOTE — Assessment & Plan Note (Signed)
Patient is status post bare metal stent to her LAD on September 5. She is on chronic Coumadin for history of recurrent DVTs and pulmonary emboli. I will continue her Plavix but discontinue aspirin. Continue statin.

## 2014-08-30 NOTE — Telephone Encounter (Signed)
Reviewed Dr. Bari Mantis notes. Pt was non-compliant with cpap and declined therapy. He checked overnight pulse oximetry and felt that nocturnal oxygen alone would not be of any benefit to her.

## 2014-08-30 NOTE — Assessment & Plan Note (Signed)
Blood pressure controlled. Continue present medications. 

## 2014-08-30 NOTE — Assessment & Plan Note (Signed)
Patient feels as though she may be having side effects from Lipitor. I will discontinue and begin recall 40 mg daily. Check lipids and liver in 4 weeks.

## 2014-08-30 NOTE — Telephone Encounter (Signed)
Spoke with pt, she states Dr Elsworth Soho told her she did not need CPAP any longer. Pt scheduled appt for 09/05/14 at 10:30am to discuss further workup.

## 2014-08-30 NOTE — Assessment & Plan Note (Signed)
Continue ACE inhibitor and beta blocker. I will most likely repeat her echocardiogram when she returns in 6 months to see if LV function has improved.

## 2014-08-30 NOTE — Telephone Encounter (Signed)
Received call from pt wanting to know what vitamin she can take to help with her energy. Feels tired all the time. Pt states she just saw cardiologist and forgot to ask him.  Advised pt that she would likely need an appt for further testing before recommendation could be made and to check with cardiologist first. Pt states she has appt in 4 weeks and will address with PCP at that time unless she changes her mind or symptoms worsen.

## 2014-08-30 NOTE — Assessment & Plan Note (Signed)
Patient is on chronic Coumadin her history of recurrent DVTs and pulmonary emboli. INRs are monitored in [facility].

## 2014-08-30 NOTE — Progress Notes (Signed)
HPI: FU CAD. Patient admitted in September 2015 with myocardial infarction. Cardiac catheterization revealed a normal left main, 95% mid to distal LAD, normal circumflex and normal right coronary artery. Ejection fraction 35-40%. Patient had bare metal stent to LAD. Echocardiogram September 2015 showed an ejection fraction of 40-45% with akinesis of the apex. There was mild left atrial enlargement and grade 1 diastolic dysfunction. Patient also on chronic anticoagulation for previous DVT and pulmonary embolus. Since last seen, She has mild dyspnea on exertion but no orthopnea or PND. Chronic mild pedal edema. No chest pain or syncope.   Current Outpatient Prescriptions  Medication Sig Dispense Refill  . acetaminophen (TYLENOL) 325 MG tablet Take 650 mg by mouth every 6 (six) hours as needed (for pain).      Marland Kitchen amLODipine (NORVASC) 10 MG tablet Take 10 mg by mouth daily.      Marland Kitchen anastrozole (ARIMIDEX) 1 MG tablet Take 1 mg by mouth daily.      Marland Kitchen aspirin EC 81 MG EC tablet Take 1 tablet (81 mg total) by mouth daily.  30 tablet  3  . atorvastatin (LIPITOR) 80 MG tablet Take 1 tablet (80 mg total) by mouth every evening.  30 tablet  6  . B Complex-C (SUPER B COMPLEX PO) Take 1 tablet by mouth daily.      . Calcium Carbonate-Vitamin D (CALCIUM-VITAMIN D) 600-200 MG-UNIT CAPS Take 1 tablet by mouth daily.       . clopidogrel (PLAVIX) 75 MG tablet Take 1 tablet (75 mg total) by mouth daily.  25 tablet  0  . cyclobenzaprine (FLEXERIL) 5 MG tablet Take 5 mg by mouth at bedtime.      . furosemide (LASIX) 20 MG tablet Take 20 mg by mouth daily as needed (for swelling of legs).       . Glucosamine-Chondroitin (GLUCOSAMINE CHONDR COMPLEX PO) Take 1 capsule by mouth daily.      . insulin detemir (LEVEMIR) 100 UNIT/ML injection Inject 45 Units into the skin at bedtime.       . insulin regular (NOVOLIN R,HUMULIN R) 100 units/mL injection Inject into the skin 3 (three) times daily before meals. Sliding scale  16-30  Units: Humulin      . lisinopril (PRINIVIL,ZESTRIL) 40 MG tablet Take 40 mg by mouth daily.      . metoprolol succinate (TOPROL XL) 25 MG 24 hr tablet Take 1 tablet (25 mg total) by mouth daily.  30 tablet  6  . mirabegron ER (MYRBETRIQ) 50 MG TB24 tablet Take 50 mg by mouth daily. Started 8/26. (7 day package) Started 2nd package (7 days each) on 9/2.      . nitroGLYCERIN (NITROSTAT) 0.4 MG SL tablet Place 1 tablet (0.4 mg total) under the tongue every 5 (five) minutes as needed for chest pain (up to 3 doses - if no relief, call 911).  25 tablet  3  . traMADol (ULTRAM) 50 MG tablet TAKE 1 OR 2 TABLETS EVERY 6 HOURS AS NEEDED  40 tablet  0  . warfarin (COUMADIN) 5 MG tablet Take 2 tablets (10mg ) by mouth tonight (07/25/14). Tomorrow (07/26/14), restart usual dose of 1 and a half tablets (7.5mg ) Monday through Friday and 1 tablet (5mg ) on Saturdays and Sundays.       No current facility-administered medications for this visit.   Facility-Administered Medications Ordered in Other Visits  Medication Dose Route Frequency Provider Last Rate Last Dose  . silver sulfADIAZINE (SILVADENE) 1 % cream  Topical Daily Lora Paula, MD         Past Medical History  Diagnosis Date  . Hyperplastic colon polyp   . Diabetes mellitus type II   . Osteoarthritis   . GERD (gastroesophageal reflux disease)   . Osteopenia   . Lupus     of skin see dermatologist frequently  . OA (osteoarthritis of spine)     C-spine  . DVT (deep venous thrombosis)     a. right leg DVT 05-17-2008. b. h/o leg swelling of left leg 2-10, u/s showed a old clot. c. another DVT reported 07/2011.  . 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 Allergy   . Clotting disorder     hx of PE and DVT  . C. difficile diarrhea     hx of,   . Skin cancer     skin CA  . PONV (postoperative nausea and vomiting)   . Hypertension   . Breast cancer     left ductal breast ca dx 2003 approx, s/p  XRt x 6 weeks, released from oncology (per  patient); dx right breast ca 2014 (in remission)  . Refusal of blood transfusions as patient is Jehovah's Witness   . CAD (coronary artery disease)     a. NSTEMI 07/2014: s/p BMS to mid-distal LAD.  . Ischemic cardiomyopathy     a. cath 07/22/2014 EF 35-40%; Echo 07/22/2013 EF 21-30%, grade 1 diastolic dysfunction, large area of apical akinesis.  Marland Kitchen HTN (hypertension)   . Dyslipidemia   . Transaminitis     a. Mild during 07/2014 admission ? due to MI    Past Surgical History  Procedure Laterality Date  . Cholecystectomy    . Breast biopsy      left breast and right breast  . Breast lumpectomy      left breast-ductal carcinoma in situ  . Appendectomy    . Skin cancer excision      from back   . Breast lumpectomy with needle localization and axillary sentinel lymph node bx Right 06/17/2013    Procedure: BREAST LUMPECTOMY WITH NEEDLE LOCALIZATION AND AXILLARY SENTINEL LYMPH NODE BX;  Surgeon: Merrie Roof, MD;  Location: [facility];  Service: General;  Laterality: Right;  . Irrigation and debridement abscess Right 02/18/2014    Procedure: IRRIGATION AND DEBRIDEMENT RIGHT AXILLARY ABSCESS;  Surgeon: Leighton Ruff, MD;  Location: [facility];  Service: General;  Laterality: Right;    History   Social History  . Marital Status: Widowed    Spouse Name: N/A    Number of Children: 3  . Years of Education: N/A   Occupational History  . Retired    Social History Main Topics  . Smoking status: Never Smoker   . Smokeless tobacco: Never Used  . Alcohol Use: No  . Drug Use: No  . Sexual Activity: Not Currently   Other Topics Concern  . Not on file   Social History Narrative   Single-widow    3 children , 1 in [place] (son, daughter - [place])    [place] from [place], [place]   Alcohol Use - no      tobacco-- never     Illicit Drug Use - no    Pt is Jehovah's Witness-No blood products   Daughter - Johnnie Moten           ROS: no fevers or  chills, productive cough, hemoptysis, dysphasia,  odynophagia, melena, hematochezia, dysuria, hematuria, rash, seizure activity, orthopnea, PND, claudication. Remaining systems are negative.  Physical Exam: Well-developed well-nourished in no acute distress.  Skin is warm and dry.  HEENT is normal.  Neck is supple.  Chest is clear to auscultation with normal expansion.  Cardiovascular exam is regular rate and rhythm.  Abdominal exam nontender or distended. No masses palpated. Extremities show 1+ edema. neuro grossly intact

## 2014-08-30 NOTE — Telephone Encounter (Signed)
I believe that her sleepiness is due to her untreated sleep apnea, not vitamin deficiency.  I would recommend that she start using her CPAP every night and I think that her symptoms will improve dramatically.

## 2014-09-05 ENCOUNTER — Telehealth: Payer: Self-pay | Admitting: Family

## 2014-09-05 ENCOUNTER — Telehealth: Payer: Self-pay | Admitting: *Deleted

## 2014-09-05 ENCOUNTER — Encounter: Payer: Self-pay | Admitting: Family

## 2014-09-05 ENCOUNTER — Ambulatory Visit (INDEPENDENT_AMBULATORY_CARE_PROVIDER_SITE_OTHER): Payer: Medicare Other | Admitting: Family

## 2014-09-05 VITALS — BP 120/60 | HR 77 | Temp 97.8°F | Resp 16 | Ht 66.0 in | Wt 200.4 lb

## 2014-09-05 DIAGNOSIS — G4733 Obstructive sleep apnea (adult) (pediatric): Secondary | ICD-10-CM | POA: Diagnosis not present

## 2014-09-05 DIAGNOSIS — D649 Anemia, unspecified: Secondary | ICD-10-CM | POA: Diagnosis not present

## 2014-09-05 DIAGNOSIS — Z79899 Other long term (current) drug therapy: Secondary | ICD-10-CM

## 2014-09-05 DIAGNOSIS — M545 Low back pain: Secondary | ICD-10-CM

## 2014-09-05 LAB — IRON: Iron: 68 ug/dL (ref 42–145)

## 2014-09-05 LAB — FOLATE

## 2014-09-05 LAB — VITAMIN B12: VITAMIN B 12: 574 pg/mL (ref 211–911)

## 2014-09-05 MED ORDER — NYSTATIN 100000 UNIT/GM EX OINT: 1.0000 "application " | TOPICAL_OINTMENT | Freq: Two times a day (BID) | CUTANEOUS | Status: DC | PRN

## 2014-09-05 MED ORDER — CLOPIDOGREL BISULFATE 75 MG PO TABS: 75.0000 mg | ORAL_TABLET | Freq: Every day | ORAL | Status: AC

## 2014-09-05 NOTE — Assessment & Plan Note (Signed)
Lab Results  Component Value Date   WBC 6.2 08/04/2014   HGB 11.1* 08/04/2014   HCT 33.9* 08/04/2014   MCV 84.4 08/04/2014   PLT 216.0 08/04/2014   Will obtain IFOB. Pt is on coumadin. Dr. Stanford Breed would like to have her continue on plavix (see phone note).  Check b12, folate, serum iron.

## 2014-09-05 NOTE — Telephone Encounter (Signed)
Message copied by Cristopher Estimable on Tue Sep 05, 2014  2:02 PM ------      Message from: Lelon Perla      Created: Tue Sep 05, 2014  1:56 PM       She should be on plavix and coumadin with no aspirin      Kirk Ruths            ----- Message -----         From: Debbrah Alar, NP         Sent: 09/05/2014   1:26 PM           To: Lelon Perla, MD            Hello,            I saw Ms. Liscano this morning and I wanted to bring to your attention that she has had some confusion re:  Plavix. The cardiology team that discharged her advised her to take for 4 weeks only and then stop which she did. I believe her last dose was around 10/5.  I see in your note that you wished for her to continue the plavix.  She is Jehovah's witness and I know that increase bleed risk is a concern.  I will defer to you but wanted to let you know that she is not currently taking- Melissa       ------

## 2014-09-05 NOTE — Patient Instructions (Addendum)
You will be contacted about your referral to Dr. Elsworth Soho. Please schedule follow up with Dr. Hal Neer re: your back pain. You may use nystatin ointment as need for for groin rash.  Please complete stool for blood and mail back.  Complete lab work prior to leaving.

## 2014-09-05 NOTE — Assessment & Plan Note (Signed)
She has lumbar disc disease on MRI and is followed by Dr. Hal Neer. Recommended that pt arrange follow up with Dr. Hal Neer.

## 2014-09-05 NOTE — Progress Notes (Signed)
Subjective:    Patient ID: [name], female    DOB: Oct 13, 1941, 73 y.o.   MRN: [id number]  HPI  [name] is a 73 year old female who presents today with chief complaint of fatigue.   1. Fatigue - She reports that she has no energy. She reports more fatigue after her MI in September of 2015. She denies angina or palpitations. She reports she wakes up twice during the night to urinate, but other than that she does not wake up.  She gets 8-10 hours of sleep per night.  She does not feel rested when she wakes up.  She reports that her lower back hurts at night and as soon as she wakes up.  She does not wear her CPAP at night because it messes up her hair and she doesn't like anything on her face.  She was advised to wear her CPAP at night.  The importance of this was stressed to her especially in the setting of recent MI.    2. Back pain - Reports back pain at night and as soon as she wakes up in the morning.  It is a dull, constant pain.  She has had this pain for greater than 2 years.  She would like a refill of her flexeril today.      Review of Systems  Constitutional: Positive for fatigue.  HENT: Negative.   Respiratory: Negative for cough, chest tightness, shortness of breath and wheezing.   Cardiovascular: Positive for leg swelling. Negative for chest pain and palpitations.  Musculoskeletal: Positive for back pain.  Skin: Negative for rash and wound.  Neurological: Negative.    Past Medical History  Diagnosis Date  . Hyperplastic colon polyp   . Diabetes mellitus type II   . Osteoarthritis   . GERD (gastroesophageal reflux disease)   . Osteopenia   . Lupus     of skin see dermatologist frequently  . OA (osteoarthritis of spine)     C-spine  . DVT (deep venous thrombosis)     a. right leg DVT 05-17-2008. b. h/o leg swelling of left leg 2-10, u/s showed a old clot. c. another DVT reported 07/2011.  . 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 Pulmonary embolus and  infarction 08/05/2011  . Allergy   . Clotting disorder     hx of PE and DVT  . C. difficile diarrhea     hx of,   . Skin cancer     skin CA  . PONV (postoperative nausea and vomiting)   . Hypertension   . Breast cancer     left ductal breast ca dx 2003 approx, s/p XRt x 6 weeks, released from oncology (per  patient); dx right breast ca 2014 (in remission)  . Refusal of blood transfusions as patient is Jehovah's Witness   . CAD (coronary artery disease)     a. NSTEMI 07/2014: s/p BMS to mid-distal LAD.  . Ischemic cardiomyopathy     a. cath 07/22/2014 EF 35-40%; Echo 07/22/2013 EF 60-63%, grade 1 diastolic dysfunction, large area of apical akinesis.  Marland Kitchen HTN (hypertension)   . Dyslipidemia   . Transaminitis     a. Mild during 07/2014 admission ? due to MI    History   Social History  . Marital Status: Widowed    Spouse Name: N/A    Number of Children: 3  . Years of Education: N/A   Occupational History  . Retired    Social History  Main Topics  . Smoking status: Never Smoker   . Smokeless tobacco: Never Used  . Alcohol Use: No  . Drug Use: No  . Sexual Activity: Not Currently   Other Topics Concern  . Not on file   Social History Narrative   Single-widow    3 children , 1 in [place] (son, daughter - [place])    [place] to [place] 2002 from [place], [place]   Alcohol Use - no      tobacco-- never     Illicit Drug Use - no    Pt is Jehovah's Witness-No blood products   Daughter - [name]           Past Surgical History  Procedure Laterality Date  . Cholecystectomy    . Breast biopsy      left breast and right breast  . Breast lumpectomy      left breast-ductal carcinoma in situ  . Appendectomy    . Skin cancer excision      from back   . Breast lumpectomy with needle localization and axillary sentinel lymph node bx Right 06/17/2013    Procedure: BREAST LUMPECTOMY WITH NEEDLE LOCALIZATION AND AXILLARY SENTINEL LYMPH NODE BX;  Surgeon: Kelsey Roof, MD;  Location:  [facility];  Service: General;  Laterality: Right;  . Irrigation and debridement abscess Right 02/18/2014    Procedure: IRRIGATION AND DEBRIDEMENT RIGHT AXILLARY ABSCESS;  Surgeon: Kelsey Ruff, MD;  Location: [facility];  Service: General;  Laterality: Right;    Family History  Problem Relation Age of Onset  . Diabetes Mother   . Diabetes    . Heart attack Neg Hx   . Colon cancer Neg Hx   . Breast cancer Neg Hx   . Esophageal cancer Neg Hx   . Rectal cancer Neg Hx   . Stomach cancer Neg Hx   . Cancer Father     melanoma  . Diabetes Brother   . Other Brother     amputation    Allergies  Allergen Reactions  . Metformin Diarrhea  . Pioglitazone Other (See Comments)    weight gain  . Vancomycin Rash    Erythematous rash, hands and toes became cyanotic.      Current Outpatient Prescriptions on File Prior to Visit  Medication Sig Dispense Refill  . acetaminophen (TYLENOL) 325 MG tablet Take 650 mg by mouth every 6 (six) hours as needed (for pain).      Marland Kitchen amLODipine (NORVASC) 10 MG tablet Take 10 mg by mouth daily.      Marland Kitchen anastrozole (ARIMIDEX) 1 MG tablet Take 1 mg by mouth daily.      . B Complex-C (SUPER B COMPLEX PO) Take 1 tablet by mouth daily.      . Calcium Carbonate-Vitamin D (CALCIUM-VITAMIN D) 600-200 MG-UNIT CAPS Take 1 tablet by mouth daily.       . cyclobenzaprine (FLEXERIL) 5 MG tablet Take 5 mg by mouth at bedtime.      . furosemide (LASIX) 20 MG tablet Take 20 mg by mouth daily as needed (for swelling of legs).       . Glucosamine-Chondroitin (GLUCOSAMINE CHONDR COMPLEX PO) Take 1 capsule by mouth daily.      . insulin detemir (LEVEMIR) 100 UNIT/ML injection Inject 45 Units into the skin at bedtime.       . insulin regular (NOVOLIN R,HUMULIN R) 100 units/mL injection Inject into the skin 3 (three) times daily before meals. Sliding scale 16-30  Units: Humulin      . lisinopril (PRINIVIL,ZESTRIL) 40 MG tablet Take 40 mg by mouth daily.      . metoprolol succinate (TOPROL  XL) 25 MG 24 hr tablet Take 1 tablet (25 mg total) by mouth daily.  30 tablet  6  . mirabegron ER (MYRBETRIQ) 50 MG TB24 tablet Take 50 mg by mouth daily. Started 8/26. (7 day package) Started 2nd package (7 days each) on 9/2.      . nitroGLYCERIN (NITROSTAT) 0.4 MG SL tablet Place 1 tablet (0.4 mg total) under the tongue every 5 (five) minutes as needed for chest pain (up to 3 doses - if no relief, call 911).  25 tablet  3  . pravastatin (PRAVACHOL) 40 MG tablet Take 1 tablet (40 mg total) by mouth every evening.  90 tablet  3  . traMADol (ULTRAM) 50 MG tablet TAKE 1 OR 2 TABLETS EVERY 6 HOURS AS NEEDED  40 tablet  0  . warfarin (COUMADIN) 5 MG tablet Take 2 tablets (10mg ) by mouth tonight (07/25/14). Tomorrow (07/26/14), restart usual dose of 1 and a half tablets (7.5mg ) Monday through Friday and 1 tablet (5mg ) on Saturdays and Sundays.       Current Facility-Administered Medications on File Prior to Visit  Medication Dose Route Frequency Provider Last Rate Last Dose  . silver sulfADIAZINE (SILVADENE) 1 % cream   Topical Daily Lora Paula, MD        BP 120/60  Pulse 77  Temp(Src) 97.8 F (36.6 C) (Oral)  Resp 16  Ht 5\' 6"  (1.676 m)  Wt 200 lb 6.4 oz (90.901 kg)  BMI 32.36 kg/m2  SpO2 99%       Objective:   Physical Exam  Constitutional: She is oriented to person, place, and time. She appears well-developed and well-nourished.  HENT:  Head: Normocephalic and atraumatic.  Mouth/Throat: Oropharynx is clear and moist.  Neck: Normal range of motion. Neck supple. No JVD present.  Cardiovascular: Normal rate, regular rhythm and intact distal pulses.   No murmur heard. Pulmonary/Chest: Effort normal and breath sounds normal. No stridor. No respiratory distress. She has no wheezes. She has no rales. She exhibits no tenderness.  Musculoskeletal: Normal range of motion.  Neurological: She is alert and oriented to person, place, and time.  Skin: Skin is warm and dry.  Psychiatric: She  has a normal mood and affect.          Assessment & Plan:  Will refer to Dr. Elsworth Soho for appointment to assess CPAP.  Patient wants to consider a smaller device to reduce feeling restricted.  Would like a refill of nystatin cream today for bilateral groin areas.  Will need to make appointment with Dr. Hal Neer for her back pain.    I have personally seen and examined patient and agree with Jettie Booze NP student's assessment and plan.- Debbrah Alar

## 2014-09-05 NOTE — Telephone Encounter (Signed)
Message copied by Debbrah Alar on Tue Sep 05, 2014  2:09 PM ------      Message from: Lelon Perla      Created: Tue Sep 05, 2014  1:56 PM       She should be on plavix and coumadin with no aspirin      [name]            ----- Message -----         From: Debbrah Alar, NP         Sent: 09/05/2014   1:26 PM           To: Lelon Perla, MD            Hello,            I saw [name] this morning and I wanted to bring to your attention that she has had some confusion re:  Plavix. The cardiology team that discharged her advised her to take for 4 weeks only and then stop which she did. I believe her last dose was around 10/5.  I see in your note that you wished for her to continue the plavix.  She is Jehovah's witness and I know that increase bleed risk is a concern.  I will defer to you but wanted to let you know that she is not currently taking- [name]       ------

## 2014-09-05 NOTE — Telephone Encounter (Signed)
Opened in error

## 2014-09-05 NOTE — Assessment & Plan Note (Signed)
Encouraged use of CPAP, will refer to Dr. Elsworth Soho.

## 2014-09-05 NOTE — Telephone Encounter (Signed)
Spoke with pt, Aware of dr Jacalyn Lefevre recommendations.  New script sent into the pharm.

## 2014-09-05 NOTE — Progress Notes (Signed)
Pre visit review using our clinic review tool, if applicable. No additional management support is needed unless otherwise documented below in the visit note. 

## 2014-09-06 LAB — LIPID PANEL
Cholesterol: 114 mg/dL (ref 0–200)
HDL: 32 mg/dL — ABNORMAL LOW (ref >39)
LDL Cholesterol: 60 mg/dL (ref 0–99)
TRIGLYCERIDES: 112 mg/dL (ref <150)
Total CHOL/HDL Ratio: 3.6 Ratio
VLDL: 22 mg/dL (ref 0–40)

## 2014-09-06 LAB — HEPATIC FUNCTION PANEL
ALK PHOS: 125 U/L — AB (ref 39–117)
ALT: 38 U/L — ABNORMAL HIGH (ref 0–35)
AST: 25 U/L (ref 0–37)
Albumin: 3.8 g/dL (ref 3.5–5.2)
BILIRUBIN TOTAL: 0.6 mg/dL (ref 0.2–1.2)
Bilirubin, Direct: 0.1 mg/dL (ref 0.0–0.3)
Indirect Bilirubin: 0.5 mg/dL (ref 0.2–1.2)
TOTAL PROTEIN: 6.6 g/dL (ref 6.0–8.3)

## 2014-09-07 ENCOUNTER — Encounter: Payer: Self-pay | Admitting: Family

## 2014-09-08 ENCOUNTER — Other Ambulatory Visit (HOSPITAL_COMMUNITY): Payer: Self-pay | Admitting: *Deleted

## 2014-09-08 DIAGNOSIS — R7989 Other specified abnormal findings of blood chemistry: Secondary | ICD-10-CM

## 2014-09-08 DIAGNOSIS — R748 Abnormal levels of other serum enzymes: Secondary | ICD-10-CM

## 2014-09-08 DIAGNOSIS — R945 Abnormal results of liver function studies: Principal | ICD-10-CM

## 2014-09-10 ENCOUNTER — Telehealth: Payer: Self-pay | Admitting: Family

## 2014-09-10 DIAGNOSIS — D649 Anemia, unspecified: Secondary | ICD-10-CM

## 2014-09-10 NOTE — Telephone Encounter (Signed)
Looks like ifob was cancelled in error. Could you please contact patient to request completion?

## 2014-09-11 NOTE — Telephone Encounter (Signed)
IFOB was cancelled and re-ordered on 09/05/14 however order entered as routine instead of future. Order cancelled and re-ordered. Spoke with pt, she states her fist kit was missing the vial for collection but she has received 2nd kit and is working on completing it now.

## 2014-09-12 ENCOUNTER — Other Ambulatory Visit: Payer: Self-pay | Admitting: *Deleted

## 2014-09-12 DIAGNOSIS — R748 Abnormal levels of other serum enzymes: Secondary | ICD-10-CM

## 2014-09-12 DIAGNOSIS — R945 Abnormal results of liver function studies: Principal | ICD-10-CM

## 2014-09-12 DIAGNOSIS — R7989 Other specified abnormal findings of blood chemistry: Secondary | ICD-10-CM

## 2014-09-13 ENCOUNTER — Other Ambulatory Visit (INDEPENDENT_AMBULATORY_CARE_PROVIDER_SITE_OTHER): Payer: Medicare Other

## 2014-09-13 DIAGNOSIS — D649 Anemia, unspecified: Secondary | ICD-10-CM

## 2014-09-15 NOTE — Telephone Encounter (Signed)
Received call from [facility], she wanted clarification of IFOB order. States original order was cancelled. They have re-entered order but she wanted to know if pt had IFOB kit already. Advised her that pt confirmed she was working on collection of specimen on 09/11/14.

## 2014-09-19 ENCOUNTER — Telehealth: Payer: Self-pay | Admitting: *Deleted

## 2014-09-19 DIAGNOSIS — D649 Anemia, unspecified: Secondary | ICD-10-CM

## 2014-09-19 NOTE — Telephone Encounter (Signed)
It is medically important to obtain. Lets mail her another kit please to complete.

## 2014-09-19 NOTE — Telephone Encounter (Signed)
New kit mailed to pt. Left detailed message on pt's home # that I am mailing kit and to let me know if she doesn't receive it.

## 2014-09-19 NOTE — Telephone Encounter (Signed)
Future IFOB order entered. Notified Santiago Glad at the lab to be looking for kit in the 2-3weeks.

## 2014-09-19 NOTE — Telephone Encounter (Signed)
Received call from Santiago Glad with [employer], she states that they still have not received 2nd IFOB kit. She apologized for any confusion/error that may have occurred in the lab. States they have put new processes in place to better track and follow IFOBs once they are returned. Spoke with pt, she states she hand delivered 2nd IFOB kit about 1 week ago. Pt will be travelling this week with her daughter as her son-in-law just passed away. Please advise if you want pt to obtain another IFOB for the third time?

## 2014-09-27 ENCOUNTER — Other Ambulatory Visit: Payer: Medicare Other

## 2014-09-27 LAB — FECAL OCCULT BLOOD, IMMUNOCHEMICAL: Fecal Occult Bld: POSITIVE — AB

## 2014-09-28 ENCOUNTER — Telehealth: Payer: Self-pay | Admitting: Family

## 2014-09-28 DIAGNOSIS — R195 Other fecal abnormalities: Secondary | ICD-10-CM

## 2014-09-28 NOTE — Telephone Encounter (Signed)
Please contact patient and let her know that I reviewed her stool study and it is + for blood. I would like to get her in to see gastroenterology and also Dr. Marin Olp (hematology). If she has black or bloody stools, she needs to go to the ER.  I will also forward this info on to Dr. Stanford Breed her cardiologist for review/recommendations since she is [place] witness.

## 2014-09-28 NOTE — Telephone Encounter (Signed)
-----   Message from Mosie Lukes, MD sent at 09/28/2014  8:38 PM EST ----- Sure we can talk tomorrow but I think the IVC filter is a good idea then she could just be on Plavix maybe, I would also get cardiology and gastroenterology involved, we might need them quick if she really starts to bleed ----- Message -----    From: Debbrah Alar, NP    Sent: 09/28/2014   7:08 PM      To: Mosie Lukes, MD  Hi,  Can we talk about her please tomorrow?  She is Neurosurgeon witness, + hx of recurrent DVT on lifelong coumadin, recent bare metal stent on plavix also per cardiology.  Mild anemia, and now of course heme positive.  I can get her in to see GI and notify cardiology and hematology.  Maybe she would be a candidate for an ivc filter?  Any other thoughts?   Thanks!  [name]

## 2014-09-29 NOTE — Telephone Encounter (Signed)
Left message on home # for pt to return my call. 

## 2014-09-29 NOTE — Telephone Encounter (Addendum)
Spoke with pt. Reviewed plans and + IFOB. She is advised to go to the ER if she develops black/bloody stools. Will plan to repeat cbc on Monday at her follow up visit. She goes to coumadin clinic today.

## 2014-09-29 NOTE — Telephone Encounter (Signed)
Would continue plavix if possibe given recent PCI; she should have hgb checked [name]

## 2014-10-02 ENCOUNTER — Other Ambulatory Visit (HOSPITAL_BASED_OUTPATIENT_CLINIC_OR_DEPARTMENT_OTHER): Payer: Medicare Other | Admitting: Lab

## 2014-10-02 ENCOUNTER — Ambulatory Visit (HOSPITAL_BASED_OUTPATIENT_CLINIC_OR_DEPARTMENT_OTHER): Payer: Medicare Other | Admitting: Family

## 2014-10-02 ENCOUNTER — Telehealth: Payer: Self-pay | Admitting: Hematology & Oncology

## 2014-10-02 ENCOUNTER — Telehealth: Payer: Self-pay | Admitting: *Deleted

## 2014-10-02 ENCOUNTER — Ambulatory Visit: Payer: Medicare Other | Admitting: Family

## 2014-10-02 ENCOUNTER — Ambulatory Visit: Payer: Medicare Other

## 2014-10-02 ENCOUNTER — Encounter: Payer: Self-pay | Admitting: Family

## 2014-10-02 VITALS — BP 141/61 | HR 76 | Temp 99.1°F | Resp 14 | Ht 66.0 in | Wt 205.0 lb

## 2014-10-02 DIAGNOSIS — Z86718 Personal history of other venous thrombosis and embolism: Secondary | ICD-10-CM | POA: Insufficient documentation

## 2014-10-02 DIAGNOSIS — D509 Iron deficiency anemia, unspecified: Secondary | ICD-10-CM

## 2014-10-02 DIAGNOSIS — I82409 Acute embolism and thrombosis of unspecified deep veins of unspecified lower extremity: Secondary | ICD-10-CM

## 2014-10-02 DIAGNOSIS — Z7901 Long term (current) use of anticoagulants: Secondary | ICD-10-CM

## 2014-10-02 DIAGNOSIS — D500 Iron deficiency anemia secondary to blood loss (chronic): Secondary | ICD-10-CM

## 2014-10-02 DIAGNOSIS — I2699 Other pulmonary embolism without acute cor pulmonale: Secondary | ICD-10-CM | POA: Diagnosis not present

## 2014-10-02 DIAGNOSIS — Z86711 Personal history of pulmonary embolism: Secondary | ICD-10-CM

## 2014-10-02 LAB — CBC WITH DIFFERENTIAL (CANCER CENTER ONLY)
BASO#: 0 10*3/uL (ref 0.0–0.2)
BASO%: 0.2 % (ref 0.0–2.0)
EOS%: 2.5 % (ref 0.0–7.0)
Eosinophils Absolute: 0.1 10*3/uL (ref 0.0–0.5)
HEMATOCRIT: 34.9 % (ref 34.8–46.6)
HGB: 11.2 g/dL — ABNORMAL LOW (ref 11.6–15.9)
LYMPH#: 1.3 10*3/uL (ref 0.9–3.3)
LYMPH%: 23.6 % (ref 14.0–48.0)
MCH: 27.5 pg (ref 26.0–34.0)
MCHC: 32.1 g/dL (ref 32.0–36.0)
MCV: 86 fL (ref 81–101)
MONO#: 0.5 10*3/uL (ref 0.1–0.9)
MONO%: 10 % (ref 0.0–13.0)
NEUT#: 3.4 10*3/uL (ref 1.5–6.5)
NEUT%: 63.7 % (ref 39.6–80.0)
PLATELETS: 180 10*3/uL (ref 145–400)
RBC: 4.07 10*6/uL (ref 3.70–5.32)
RDW: 13.3 % (ref 11.1–15.7)
WBC: 5.3 10*3/uL (ref 3.9–10.0)

## 2014-10-02 MED ORDER — RIVAROXABAN 15 MG PO TABS: 15.0000 mg | ORAL_TABLET | Freq: Every day | ORAL | Status: AC

## 2014-10-02 NOTE — Telephone Encounter (Signed)
Pt called wanting to let us know that she had her hematology consult today and was told to stop Coumadin and start Xarelto, continue Plavix. Does not currently recommend getting IVC placed. Pt asked what medication was lipitor and if she had ever been on it in the past. Pt has Rx from September. Advised pt that lipitor was changed to pravastatin while she was in the hospital in September by cardiology. Pt states she is so confused with her medications and wants to bring medications here for [place] to verify that she has the right meds she should be taking. Scheduled pt nurse visit for 10/04/14 at 2pm and she will bring medications with her.

## 2014-10-02 NOTE — Progress Notes (Signed)
[facility]  [phone] [phone]  ID: Cecille Aver OB: [birth year] MR#: [id number] IFO#:[id number] Patient Care Team: Debbrah Alar, NP as PCP - General (Internal Medicine) Vernie Ammons, MD as Consulting Physician (Dermatology) Lafayette Dragon, MD (Gastroenterology) Amalia Greenhouse, MD as Referring Physician (Endocrinology) [place] Crane, MD as Referring Physician (Rheumatology) Autumn Messing III, MD as Consulting Physician (General Surgery) Deatra Robinson, MD as Consulting Physician (Oncology) Lora Paula, MD as Consulting Physician (Radiation Oncology)  DIAGNOSIS: 1. Recurrent deep venous thrombosis/pulmonary embolism. 2. History of ductal carcinoma the left breast, 2003. 3. Insulin-dependent diabetes. 4. Iron deficiency anemia.  INTERVAL HISTORY: Ms. Doughman is here today for a follow-up. She has been on Coumadin since 2013. She has not had any other clotting issues while on this treatment. In September, she had an MI and had a bare metal stent placed. She is now on Plavix as well as Coumadin.  A recent occult stool test was positive. She is going to follow-up with GI Dr. Walker Shadow for  Possible colonoscopy. Her last colonoscopy was 3 years ago.  She is wearing her compression stockings all the time and this has really helped with the swelling in her legs.  She last had an iron infusion in August 2013. We heme tested her stool today and it was negative. She denies fever, chills, n/v, cough, rash, headache, dizziness, SOB, chest pain, palpitations, abdominal pain, diarrhea, blood in urine or stool. She has constipation at times that is relieved with stool softeners. No bleeding or pain.  No swelling, tenderness, numbness or tingling in her extremities. Her appetite is good and she is drinking plenty of fluids. Her weight is stable.   CURRENT TREATMENT: 1. Coumadin to maintain INR between 2-3. 2. IV iron as indicated. 3. Plavix  REVIEW  OF SYSTEMS: All other 10 point review of systems is negative.   PAST MEDICAL HISTORY: Past Medical History  Diagnosis Date  . Hyperplastic colon polyp   . Diabetes mellitus type II   . Osteoarthritis   . GERD (gastroesophageal reflux disease)   . Osteopenia   . Lupus     of skin see dermatologist frequently  . OA (osteoarthritis of spine)     C-spine  . DVT (deep venous thrombosis)     a. right leg DVT 05-17-2008. b. h/o leg swelling of left leg 2-10, u/s showed a old clot. c. another DVT reported 07/2011.  . 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 Allergy   . Clotting disorder     hx of PE and DVT  . C. difficile diarrhea     hx of,   . Skin cancer     skin CA  . PONV (postoperative nausea and vomiting)   . Hypertension   . Breast cancer     left ductal breast ca dx 2003 approx, s/p XRt x 6 weeks, released from oncology (per  patient); dx right breast ca 2014 (in remission)  . Refusal of blood transfusions as patient is Jehovah's Witness   . CAD (coronary artery disease)     a. NSTEMI 07/2014: s/p BMS to mid-distal LAD.  . Ischemic cardiomyopathy     a. cath 07/22/2014 EF 35-40%; Echo 07/22/2013 EF 67-67%, grade 1 diastolic dysfunction, large area of apical akinesis.  Marland Kitchen HTN (hypertension)   . Dyslipidemia   . Transaminitis     a. Mild during 07/2014 admission ? due to MI  PAST SURGICAL HISTORY: Past Surgical History  Procedure Laterality Date  . Cholecystectomy    . Breast biopsy      left breast and right breast  . Breast lumpectomy      left breast-ductal carcinoma in situ  . Appendectomy    . Skin cancer excision      from back   . Breast lumpectomy with needle localization and axillary sentinel lymph node bx Right 06/17/2013    Procedure: BREAST LUMPECTOMY WITH NEEDLE LOCALIZATION AND AXILLARY SENTINEL LYMPH NODE BX;  Surgeon: Merrie Roof, MD;  Location: [facility];  Service: General;  Laterality: Right;  . Irrigation and  debridement abscess Right 02/18/2014    Procedure: IRRIGATION AND DEBRIDEMENT RIGHT AXILLARY ABSCESS;  Surgeon: Leighton Ruff, MD;  Location: [facility];  Service: General;  Laterality: Right;    FAMILY HISTORY Family History  Problem Relation Age of Onset  . Diabetes Mother   . Diabetes    . Heart attack Neg Hx   . Colon cancer Neg Hx   . Breast cancer Neg Hx   . Esophageal cancer Neg Hx   . Rectal cancer Neg Hx   . Stomach cancer Neg Hx   . Cancer Father     melanoma  . Diabetes Brother   . Other Brother     amputation    GYNECOLOGIC HISTORY:  No LMP recorded. Patient is postmenopausal.   SOCIAL HISTORY: History   Social History  . Marital Status: Widowed    Spouse Name: N/A    Number of Children: 3  . Years of Education: N/A   Occupational History  . Retired    Social History Main Topics  . Smoking status: Never Smoker   . Smokeless tobacco: Never Used  . Alcohol Use: No  . Drug Use: No  . Sexual Activity: Not Currently   Other Topics Concern  . Not on file   Social History Narrative   Single-widow    3 children , 1 in [place] (son, daughter - [place])    [place] to [place] 2002 from [place], [place]   Alcohol Use - no      tobacco-- never     Illicit Drug Use - no    Pt is Jehovah's Witness-No blood products   Daughter - Arla Boutwell           ADVANCED DIRECTIVES:  <no information>  HEALTH MAINTENANCE: History  Substance Use Topics  . Smoking status: Never Smoker   . Smokeless tobacco: Never Used  . Alcohol Use: No   Colonoscopy: PAP: Bone density: Lipid panel:  Allergies  Allergen Reactions  . Metformin Diarrhea  . Pioglitazone Other (See Comments)    weight gain  . Vancomycin Rash    Erythematous rash, hands and toes became cyanotic.      Current Outpatient Prescriptions  Medication Sig Dispense Refill  . acetaminophen (TYLENOL) 325 MG tablet Take 650 mg by mouth every 6 (six) hours as needed (for pain).    Marland Kitchen amLODipine (NORVASC) 10 MG  tablet Take 10 mg by mouth daily.    Marland Kitchen anastrozole (ARIMIDEX) 1 MG tablet Take 1 mg by mouth daily.    . B Complex-C (SUPER B COMPLEX PO) Take 1 tablet by mouth daily.    . Calcium Carbonate-Vitamin D (CALCIUM-VITAMIN D) 600-200 MG-UNIT CAPS Take 1 tablet by mouth daily.     . clopidogrel (PLAVIX) 75 MG tablet Take 1 tablet (75 mg total) by mouth daily. 30 tablet 12  .  cyclobenzaprine (FLEXERIL) 5 MG tablet Take 5 mg by mouth at bedtime.    . furosemide (LASIX) 20 MG tablet Take 20 mg by mouth daily as needed (for swelling of legs).     . Glucosamine-Chondroitin (GLUCOSAMINE CHONDR COMPLEX PO) Take 1 capsule by mouth daily.    . insulin detemir (LEVEMIR) 100 UNIT/ML injection Inject 45 Units into the skin at bedtime.     . insulin regular (NOVOLIN R,HUMULIN R) 100 units/mL injection Inject into the skin 3 (three) times daily before meals. Sliding scale 16-30  Units: Humulin    . lisinopril (PRINIVIL,ZESTRIL) 40 MG tablet Take 40 mg by mouth daily.    . metoprolol succinate (TOPROL XL) 25 MG 24 hr tablet Take 1 tablet (25 mg total) by mouth daily. 30 tablet 6  . mirabegron ER (MYRBETRIQ) 50 MG TB24 tablet Take 50 mg by mouth daily. Started 8/26. (7 day package) Started 2nd package (7 days each) on 9/2.    . nitroGLYCERIN (NITROSTAT) 0.4 MG SL tablet Place 1 tablet (0.4 mg total) under the tongue every 5 (five) minutes as needed for chest pain (up to 3 doses - if no relief, call 911). 25 tablet 3  . nystatin ointment (MYCOSTATIN) Apply 1 application topically 2 (two) times daily as needed. 30 g 0  . pravastatin (PRAVACHOL) 40 MG tablet Take 1 tablet (40 mg total) by mouth every evening. 90 tablet 3  . traMADol (ULTRAM) 50 MG tablet TAKE 1 OR 2 TABLETS EVERY 6 HOURS AS NEEDED 40 tablet 0  . warfarin (COUMADIN) 5 MG tablet Take 2 tablets (10mg ) by mouth tonight (07/25/14). Tomorrow (07/26/14), restart usual dose of 1 and a half tablets (7.5mg ) Monday through Friday and 1 tablet (5mg ) on Saturdays and  Sundays.     No current facility-administered medications for this visit.   Facility-Administered Medications Ordered in Other Visits  Medication Dose Route Frequency Provider Last Rate Last Dose  . silver sulfADIAZINE (SILVADENE) 1 % cream   Topical Daily Lora Paula, MD        OBJECTIVE: There were no vitals filed for this visit. There were no vitals filed for this visit. ECOG FS:0 - Asymptomatic Ocular: Sclerae unicteric, pupils equal, round and reactive to light Ear-nose-throat: Oropharynx clear, dentition fair Lymphatic: No cervical or supraclavicular adenopathy Lungs no rales or rhonchi, good excursion bilaterally Heart regular rate and rhythm, no murmur appreciated Abd soft, nontender, positive bowel sounds MSK no focal spinal tenderness, no joint edema Neuro: non-focal, well-oriented, appropriate affect Breasts: Deferred  LAB RESULTS: CMP     Component Value Date/Time   NA 138 08/04/2014 1531   NA 141 12/12/2013 1537   K 4.2 08/04/2014 1531   K 4.2 12/12/2013 1537   CL 102 08/04/2014 1531   CO2 29 08/04/2014 1531   CO2 27 12/12/2013 1537   GLUCOSE 163* 08/04/2014 1531   GLUCOSE 228* 12/12/2013 1537   GLUCOSE 278 09/13/2009 0000   BUN 16 08/04/2014 1531   BUN 11.0 12/12/2013 1537   CREATININE 0.8 08/04/2014 1531   CREATININE 0.93 07/10/2014 1023   CREATININE 0.9 12/12/2013 1537   CALCIUM 9.8 08/04/2014 1531   CALCIUM 9.3 12/12/2013 1537   PROT 6.6 09/06/2014 0829   PROT 6.8 12/12/2013 1537   ALBUMIN 3.8 09/06/2014 0829   ALBUMIN 3.4* 12/12/2013 1537   AST 25 09/06/2014 0829   AST 28 12/12/2013 1537   ALT 38* 09/06/2014 0829   ALT 31 12/12/2013 1537   ALKPHOS 125* 09/06/2014 2595  ALKPHOS 92 12/12/2013 1537   BILITOT 0.6 09/06/2014 0829   BILITOT 0.60 12/12/2013 1537   GFRNONAA 81* 07/23/2014 0240   GFRNONAA 71 12/20/2012 1140   GFRAA >90 07/23/2014 0240   GFRAA 81 12/20/2012 1140   INo results found for: SPEP, UPEP Lab Results  Component  Value Date   WBC 6.2 08/04/2014   NEUTROABS 3.2 07/21/2014   HGB 11.1* 08/04/2014   HCT 33.9* 08/04/2014   MCV 84.4 08/04/2014   PLT 216.0 08/04/2014   No results found for: LABCA2 No components found for: DZHGD924 No results for input(s): INR in the last 168 hours.  STUDIES: No results found.  ASSESSMENT/PLAN: Ms. Duhart is a 72 year old white female with history of recurrent deep vein thrombosis/pulmonary embolism.She is on lifelong Coumadin.She recently had an MI and is now on Plavix as well. She had a positive occult blood test during a visit with her PCP. Her occult blood test today was negative.  She denies any bleeding and is asymptomatic at this time.  Her Hgb today is 11.2.  We will stop her Coumadin and start her on Xarelto. Her last INR was 4.  Hopefully, she will do well with the Xarelto and not need an IVC filter.  We will see her back in 3 weeks for labs and follow-up.  She knows to call here with any questions or concerns and to go to the ED in the event of an emergency. We can certainly see her sooner if need be.   Eliezer Bottom, NP 10/02/2014 1:49 PM

## 2014-10-02 NOTE — Telephone Encounter (Signed)
Noted  

## 2014-10-02 NOTE — Telephone Encounter (Signed)
I spoke w NEW PATIENT today to remind them of their appointment with Dr. Ennever. Also, advised them to bring all medication bottles and insurance card information. ° °

## 2014-10-03 ENCOUNTER — Telehealth: Payer: Self-pay | Admitting: Hematology & Oncology

## 2014-10-03 LAB — FERRITIN CHCC: Ferritin: 385 ng/ml — ABNORMAL HIGH (ref 9–269)

## 2014-10-03 LAB — IRON AND TIBC CHCC
%SAT: 19 % — ABNORMAL LOW (ref 21–57)
IRON: 50 ug/dL (ref 41–142)
TIBC: 268 ug/dL (ref 236–444)
UIBC: 217 ug/dL (ref 120–384)

## 2014-10-03 NOTE — Telephone Encounter (Signed)
[facility] has APPROVED the XARELTO tabs and is good until 10/04/2015.        COPY SCANNED

## 2014-10-04 ENCOUNTER — Ambulatory Visit (INDEPENDENT_AMBULATORY_CARE_PROVIDER_SITE_OTHER): Payer: Medicare Other | Admitting: Family

## 2014-10-04 ENCOUNTER — Telehealth: Payer: Self-pay | Admitting: Family

## 2014-10-04 DIAGNOSIS — Z7189 Other specified counseling: Secondary | ICD-10-CM

## 2014-10-04 NOTE — Telephone Encounter (Signed)
Pt to remain off of lipitor continue pravastatin please.

## 2014-10-04 NOTE — Progress Notes (Signed)
Reviewed medications with patient. Pharmacy refilled the Lipitor and patient was not understanding why? Was she suppose to take it? Patient is currently taking pravastatin prescribed by Dr Stanford Breed in Oct 2016. Marked the bottle do not take. Gave the patient copy of medications to give the pharmacy to make sure that they had her up to date. Patient states that she has had a lot of stress in her life with cancer, heart attack and her son in law passing suddenly a week ago.   Advised patient that if she had questions, concerns or needed with coping to let us know and we would help where we could. Patient verbalizes understanding and appreciation.

## 2014-10-04 NOTE — Progress Notes (Signed)
Reviewed cardiology note.  Dr. Stanford Breed stopped lipitor.  Started pravastatin instead.

## 2014-10-05 ENCOUNTER — Telehealth: Payer: Self-pay | Admitting: Family

## 2014-10-05 NOTE — Telephone Encounter (Signed)
Patient instructed at nurse visit to remain off [place]. Bottle was marked "DO NOT TAKE". Patient taking current med list to pharmacy.

## 2014-10-05 NOTE — Telephone Encounter (Signed)
Patient called in stating that she could not afford a medicine that Dr. Marin Olp prescribed and wants to know if she should go back on coumedin. I informed patient that she needs to talk to Dr. Marin Olp office regarding this since he had prescribed the other medication. Call forwarded to [facility].

## 2014-10-21 ENCOUNTER — Other Ambulatory Visit: Payer: Self-pay | Admitting: Family

## 2014-10-23 ENCOUNTER — Ambulatory Visit: Payer: Medicare Other | Admitting: Hematology & Oncology

## 2014-10-23 ENCOUNTER — Other Ambulatory Visit: Payer: Medicare Other | Admitting: Lab

## 2014-10-25 ENCOUNTER — Telehealth: Payer: Self-pay | Admitting: Hematology & Oncology

## 2014-10-25 NOTE — Telephone Encounter (Signed)
Pt made 1-4 from 12-7 no show

## 2014-10-26 ENCOUNTER — Institutional Professional Consult (permissible substitution): Payer: Medicare Other | Admitting: Pulmonary Disease

## 2014-10-26 ENCOUNTER — Encounter (HOSPITAL_COMMUNITY): Payer: Self-pay | Admitting: Cardiovascular Disease

## 2014-10-30 ENCOUNTER — Ambulatory Visit (INDEPENDENT_AMBULATORY_CARE_PROVIDER_SITE_OTHER): Payer: Medicare Other | Admitting: Family

## 2014-10-31 ENCOUNTER — Ambulatory Visit (HOSPITAL_BASED_OUTPATIENT_CLINIC_OR_DEPARTMENT_OTHER)
Admission: RE | Admit: 2014-10-31 | Discharge: 2014-10-31 | Disposition: A | Discharge status: Home / Self Care | Payer: Medicare Other | Source: Ambulatory Visit | Attending: Family | Admitting: Family

## 2014-10-31 ENCOUNTER — Telehealth: Payer: Self-pay | Admitting: Family

## 2014-10-31 ENCOUNTER — Ambulatory Visit (INDEPENDENT_AMBULATORY_CARE_PROVIDER_SITE_OTHER): Payer: Medicare Other | Admitting: Family

## 2014-10-31 ENCOUNTER — Encounter (HOSPITAL_BASED_OUTPATIENT_CLINIC_OR_DEPARTMENT_OTHER): Payer: Self-pay

## 2014-10-31 VITALS — BP 158/68 | HR 76 | Temp 98.0°F | Wt 203.0 lb

## 2014-10-31 DIAGNOSIS — M79662 Pain in left lower leg: Secondary | ICD-10-CM | POA: Diagnosis present

## 2014-10-31 DIAGNOSIS — Z86711 Personal history of pulmonary embolism: Secondary | ICD-10-CM | POA: Insufficient documentation

## 2014-10-31 DIAGNOSIS — Z7901 Long term (current) use of anticoagulants: Secondary | ICD-10-CM | POA: Insufficient documentation

## 2014-10-31 DIAGNOSIS — G4733 Obstructive sleep apnea (adult) (pediatric): Secondary | ICD-10-CM

## 2014-10-31 DIAGNOSIS — E119 Type 2 diabetes mellitus without complications: Secondary | ICD-10-CM | POA: Diagnosis not present

## 2014-10-31 DIAGNOSIS — M7122 Synovial cyst of popliteal space [Baker], left knee: Secondary | ICD-10-CM | POA: Insufficient documentation

## 2014-10-31 DIAGNOSIS — Z853 Personal history of malignant neoplasm of breast: Secondary | ICD-10-CM | POA: Diagnosis not present

## 2014-10-31 DIAGNOSIS — I2510 Atherosclerotic heart disease of native coronary artery without angina pectoris: Secondary | ICD-10-CM

## 2014-10-31 DIAGNOSIS — E785 Hyperlipidemia, unspecified: Secondary | ICD-10-CM

## 2014-10-31 DIAGNOSIS — I10 Essential (primary) hypertension: Secondary | ICD-10-CM

## 2014-10-31 DIAGNOSIS — I2583 Coronary atherosclerosis due to lipid rich plaque: Secondary | ICD-10-CM

## 2014-10-31 DIAGNOSIS — Z86718 Personal history of other venous thrombosis and embolism: Secondary | ICD-10-CM | POA: Diagnosis not present

## 2014-10-31 DIAGNOSIS — C50219 Malignant neoplasm of upper-inner quadrant of unspecified female breast: Secondary | ICD-10-CM

## 2014-10-31 MED ORDER — METOPROLOL SUCCINATE ER 50 MG PO TB24: 50.0000 mg | ORAL_TABLET | Freq: Every day | ORAL | Status: DC

## 2014-10-31 NOTE — Progress Notes (Signed)
Subjective:    Patient ID: [name], female    DOB: 11/15/1941, 73 y.o.   MRN: [id number]  HPI  [name] is a 73 yr old female who presents today for follow up of multiple medical problems:   1) DM2- Pt is currently maintained on the following medications for diabetes:  Levemir was changed from 45units HS to 25 units twice daily and novolin sliding scale- managed by endocrinology. Last A1C was:   Lab Results  Component Value Date   HGBA1C 9.3* 07/22/2014  Last diabetic eye exam was this month Denies polyuria/polydipsia. Denies hypoglycemia Home glucose readings range 160-180, depends on diet  2) Hyperlipidemia-   Patient is currently maintained on the following medication for hyperlipidemia: Pravastatin Last lipid panel as follows:   Lab Results  Component Value Date   CHOL 114 09/06/2014   HDL 32* 09/06/2014   LDLCALC 60 09/06/2014   TRIG 112 09/06/2014   CHOLHDL 3.6 09/06/2014  Patient denies myalgia. Patient reports good compliance with low fat/low cholesterol diet.   3) anemia-  She is followed by Dr. Marin Olp.  Lab Results  Component Value Date   WBC 5.3 10/02/2014   HGB 11.2* 10/02/2014   HCT 34.9 10/02/2014   MCV 86 10/02/2014   PLT 180 10/02/2014   4) breast CA- she continues to follow with oncology- Dr. Tammi Klippel.   5) CAD- she follows with cardiology- followed by Dr. Stanford Breed.    Review of Systems See HPI  Past Medical History  Diagnosis Date  . Hyperplastic colon polyp   . Diabetes mellitus type II   . Osteoarthritis   . GERD (gastroesophageal reflux disease)   . Osteopenia   . Lupus     of skin see dermatologist frequently  . OA (osteoarthritis of spine)     C-spine  . DVT (deep venous thrombosis)     a. right leg DVT 05-17-2008. b. h/o leg swelling of left leg 2-10, u/s showed a old clot. c. another DVT reported 07/2011.  . 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 Allergy   . Clotting  disorder     hx of PE and DVT  . C. difficile diarrhea     hx of,   . Skin cancer     skin CA  . PONV (postoperative nausea and vomiting)   . Hypertension   . Breast cancer     left ductal breast ca dx 2003 approx, s/p XRt x 6 weeks, released from oncology (per  patient); dx right breast ca 2014 (in remission)  . Refusal of blood transfusions as patient is Jehovah's Witness   . CAD (coronary artery disease)     a. NSTEMI 07/2014: s/p BMS to mid-distal LAD.  . Ischemic cardiomyopathy     a. cath 07/22/2014 EF 35-40%; Echo 07/22/2013 EF 07-62%, grade 1 diastolic dysfunction, large area of apical akinesis.  Marland Kitchen HTN (hypertension)   . Dyslipidemia   . Transaminitis     a. Mild during 07/2014 admission ? due to MI    History   Social History  . Marital Status: Widowed    Spouse Name: N/A    Number of Children: 3  . Years of Education: N/A   Occupational History  . Retired    Social History Main Topics  . Smoking status: Never Smoker   . Smokeless tobacco: Never Used  . Alcohol Use: No  . Drug Use: No  . Sexual Activity:  Not Currently   Other Topics Concern  . Not on file   Social History Narrative   Single-widow    3 children , 1 in [place] (son, daughter - [place])    [place] to [place] 2002 from [place], [place]   Alcohol Use - no      tobacco-- never     Illicit Drug Use - no    Pt is Jehovah's Witness-No blood products   Daughter - [name]           Past Surgical History  Procedure Laterality Date  . Cholecystectomy    . Breast biopsy      left breast and right breast  . Breast lumpectomy      left breast-ductal carcinoma in situ  . Appendectomy    . Skin cancer excision      from back   . Breast lumpectomy with needle localization and axillary sentinel lymph node bx Right 06/17/2013    Procedure: BREAST LUMPECTOMY WITH NEEDLE LOCALIZATION AND AXILLARY SENTINEL LYMPH NODE BX;  Surgeon: Merrie Roof, MD;  Location: [facility];  Service: General;  Laterality: Right;    . Irrigation and debridement abscess Right 02/18/2014    Procedure: IRRIGATION AND DEBRIDEMENT RIGHT AXILLARY ABSCESS;  Surgeon: Leighton Ruff, MD;  Location: [facility];  Service: General;  Laterality: Right;  . Left heart cath N/A 07/22/2014    Procedure: LEFT HEART CATH;  Surgeon: Lorretta Harp, MD;  Location: [facility];  Service: Cardiovascular;  Laterality: N/A;    Family History  Problem Relation Age of Onset  . Diabetes Mother   . Diabetes    . Heart attack Neg Hx   . Colon cancer Neg Hx   . Breast cancer Neg Hx   . Esophageal cancer Neg Hx   . Rectal cancer Neg Hx   . Stomach cancer Neg Hx   . Cancer Father     melanoma  . Diabetes Brother   . Other Brother     amputation    Allergies  Allergen Reactions  . Metformin Diarrhea  . Pioglitazone Other (See Comments)    weight gain  . Vancomycin Rash    Erythematous rash, hands and toes became cyanotic.      Current Outpatient Prescriptions on File Prior to Visit  Medication Sig Dispense Refill  . amLODipine (NORVASC) 10 MG tablet Take 10 mg by mouth daily.    Marland Kitchen amLODipine (NORVASC) 10 MG tablet TAKE 1 TABLET (10 MG TOTAL) BY MOUTH DAILY. 30 tablet 1  . anastrozole (ARIMIDEX) 1 MG tablet Take 1 mg by mouth daily.    . B Complex-C (SUPER B COMPLEX PO) Take 1 tablet by mouth daily.    . Calcium Carbonate-Vitamin D (CALCIUM-VITAMIN D) 600-200 MG-UNIT CAPS Take 1 tablet by mouth daily.     . clopidogrel (PLAVIX) 75 MG tablet Take 1 tablet (75 mg total) by mouth daily. 30 tablet 12  . cyclobenzaprine (FLEXERIL) 5 MG tablet Take 5 mg by mouth at bedtime.    . furosemide (LASIX) 20 MG tablet Take 20 mg by mouth daily as needed (for swelling of legs).     . furosemide (LASIX) 20 MG tablet TAKE 1 TABLET (20 MG TOTAL) BY MOUTH DAILY AS NEEDED. 30 tablet 1  . Glucosamine-Chondroitin (GLUCOSAMINE CHONDR COMPLEX PO) Take 1 capsule by mouth daily.    . insulin detemir (LEVEMIR) 100 UNIT/ML injection Inject 45 Units into the skin at  bedtime.     Marland Kitchen  insulin regular (NOVOLIN R,HUMULIN R) 100 units/mL injection Inject into the skin 3 (three) times daily before meals. Sliding scale 16-30  Units: Humulin    . lisinopril (PRINIVIL,ZESTRIL) 40 MG tablet Take 40 mg by mouth daily.    Marland Kitchen lisinopril (PRINIVIL,ZESTRIL) 40 MG tablet TAKE 1 TABLET (40 MG TOTAL) BY MOUTH DAILY. 30 tablet 1  . metoprolol succinate (TOPROL XL) 25 MG 24 hr tablet Take 1 tablet (25 mg total) by mouth daily. 30 tablet 6  . mirabegron ER (MYRBETRIQ) 50 MG TB24 tablet Take 50 mg by mouth daily. Started 8/26. (7 day package) Started 2nd package (7 days each) on 9/2.    . nitroGLYCERIN (NITROSTAT) 0.4 MG SL tablet Place 1 tablet (0.4 mg total) under the tongue every 5 (five) minutes as needed for chest pain (up to 3 doses - if no relief, call 911). 25 tablet 3  . nystatin ointment (MYCOSTATIN) Apply 1 application topically 2 (two) times daily as needed. 30 g 0  . pravastatin (PRAVACHOL) 40 MG tablet Take 1 tablet (40 mg total) by mouth every evening. 90 tablet 3  . Rivaroxaban (XARELTO) 15 MG TABS tablet Take 1 tablet (15 mg total) by mouth daily with supper. 30 tablet 11   Current Facility-Administered Medications on File Prior to Visit  Medication Dose Route Frequency Provider Last Rate Last Dose  . silver sulfADIAZINE (SILVADENE) 1 % cream   Topical Daily Lora Paula, MD        There were no vitals taken for this visit.       Objective:   Physical Exam  Constitutional: She is oriented to person, place, and time. She appears well-developed and well-nourished. No distress.  HENT:  Head: Normocephalic and atraumatic.  Mouth/Throat: No oropharyngeal exudate.  Eyes: No scleral icterus.  Cardiovascular: Normal rate and regular rhythm.   No murmur heard. Pulmonary/Chest: Effort normal and breath sounds normal. No respiratory distress. She has no wheezes. She has no rales. She exhibits no tenderness.  Musculoskeletal: She exhibits no edema.  Trace  bilateral LE edema L>R  Neurological: She is alert and oriented to person, place, and time.  Skin: Skin is warm and dry.  Psychiatric: She has a normal mood and affect. Her behavior is normal. Judgment and thought content normal.          Assessment & Plan:

## 2014-10-31 NOTE — Patient Instructions (Addendum)
Start CPAP (company will contact you to set up). Increase toprol xl from 25mg  to 50mg . Follow up in 6 weeks for nurse visit blood pressure check. Follow up in 3 months for routine office visit.

## 2014-10-31 NOTE — Telephone Encounter (Signed)
I believe it was completed some time back at [facility]- could you please contact and request copy? Thanks.

## 2014-11-01 NOTE — Telephone Encounter (Signed)
Spoke with sleep center, most recent sleep study 2011.Requested that they fax result. Awaiting report.

## 2014-11-02 NOTE — Progress Notes (Signed)
Pt rescheduled, not seen today

## 2014-11-05 NOTE — Assessment & Plan Note (Signed)
Management per oncology. 

## 2014-11-05 NOTE — Assessment & Plan Note (Signed)
BP above goal, increase toprol xl from 25mg  to 50mg .

## 2014-11-05 NOTE — Assessment & Plan Note (Addendum)
Clinically stable, management per cardiology.  

## 2014-11-05 NOTE — Assessment & Plan Note (Signed)
She is now agreeable to CPAP.  Will arrange.

## 2014-11-05 NOTE — Assessment & Plan Note (Signed)
Fair lipid control, continue diet/statin.

## 2014-11-05 NOTE — Assessment & Plan Note (Signed)
Management per Endo, remains uncontrolled.

## 2014-11-06 NOTE — Telephone Encounter (Signed)
[name]-- do you know if you have seen this report come through?

## 2014-11-06 NOTE — Telephone Encounter (Signed)
I have not seen this report.

## 2014-11-16 ENCOUNTER — Telehealth: Payer: Self-pay

## 2014-11-16 NOTE — Telephone Encounter (Signed)
Spoke to pt dtr. Pt had eye exam in November of this year at [facility]. [phone].  Office was closed. Have a note to call back on Monday.

## 2014-11-16 NOTE — Telephone Encounter (Signed)
LVM for pt dtr to call me back.    RE: eye exam for 2015; where and who.

## 2014-11-18 NOTE — Telephone Encounter (Signed)
This pt was referred to Dr. Elsworth Soho back in October, I still want her to see him, could you please arrange apt?  I do not know what happened with that referral. Thanks.

## 2014-11-20 ENCOUNTER — Ambulatory Visit (HOSPITAL_BASED_OUTPATIENT_CLINIC_OR_DEPARTMENT_OTHER): Payer: Self-pay | Admitting: Hematology & Oncology

## 2014-11-20 ENCOUNTER — Encounter: Payer: Self-pay | Admitting: Hematology & Oncology

## 2014-11-20 ENCOUNTER — Other Ambulatory Visit (HOSPITAL_BASED_OUTPATIENT_CLINIC_OR_DEPARTMENT_OTHER): Payer: Self-pay | Admitting: Lab

## 2014-11-20 VITALS — BP 175/54 | HR 62 | Temp 98.2°F | Resp 16 | Ht 66.0 in | Wt 204.0 lb

## 2014-11-20 DIAGNOSIS — D509 Iron deficiency anemia, unspecified: Secondary | ICD-10-CM

## 2014-11-20 DIAGNOSIS — C50911 Malignant neoplasm of unspecified site of right female breast: Secondary | ICD-10-CM

## 2014-11-20 DIAGNOSIS — D500 Iron deficiency anemia secondary to blood loss (chronic): Secondary | ICD-10-CM

## 2014-11-20 DIAGNOSIS — Z86711 Personal history of pulmonary embolism: Secondary | ICD-10-CM

## 2014-11-20 DIAGNOSIS — Z86718 Personal history of other venous thrombosis and embolism: Secondary | ICD-10-CM

## 2014-11-20 DIAGNOSIS — Z7901 Long term (current) use of anticoagulants: Secondary | ICD-10-CM

## 2014-11-20 LAB — CBC WITH DIFFERENTIAL (CANCER CENTER ONLY)
BASO#: 0 10*3/uL (ref 0.0–0.2)
BASO%: 0.2 % (ref 0.0–2.0)
EOS%: 2 % (ref 0.0–7.0)
Eosinophils Absolute: 0.1 10*3/uL (ref 0.0–0.5)
HEMATOCRIT: 36 % (ref 34.8–46.6)
HGB: 11.7 g/dL (ref 11.6–15.9)
LYMPH#: 1.1 10*3/uL (ref 0.9–3.3)
LYMPH%: 18.3 % (ref 14.0–48.0)
MCH: 27.4 pg (ref 26.0–34.0)
MCHC: 32.5 g/dL (ref 32.0–36.0)
MCV: 84 fL (ref 81–101)
MONO#: 0.5 10*3/uL (ref 0.1–0.9)
MONO%: 8.8 % (ref 0.0–13.0)
NEUT#: 4.3 10*3/uL (ref 1.5–6.5)
NEUT%: 70.7 % (ref 39.6–80.0)
PLATELETS: 189 10*3/uL (ref 145–400)
RBC: 4.27 10*6/uL (ref 3.70–5.32)
RDW: 13.7 % (ref 11.1–15.7)
WBC: 6.1 10*3/uL (ref 3.9–10.0)

## 2014-11-20 LAB — COMPREHENSIVE METABOLIC PANEL
ALBUMIN: 3.7 g/dL (ref 3.5–5.2)
ALT: 21 U/L (ref 0–35)
AST: 21 U/L (ref 0–37)
Alkaline Phosphatase: 85 U/L (ref 39–117)
BUN: 12 mg/dL (ref 6–23)
CHLORIDE: 102 meq/L (ref 96–112)
CO2: 27 meq/L (ref 19–32)
CREATININE: 0.82 mg/dL (ref 0.50–1.10)
Calcium: 9.3 mg/dL (ref 8.4–10.5)
Glucose, Bld: 290 mg/dL — ABNORMAL HIGH (ref 70–99)
POTASSIUM: 4.2 meq/L (ref 3.5–5.3)
Sodium: 139 mEq/L (ref 135–145)
TOTAL PROTEIN: 6.5 g/dL (ref 6.0–8.3)
Total Bilirubin: 0.6 mg/dL (ref 0.2–1.2)

## 2014-11-20 LAB — IRON AND TIBC CHCC
%SAT: 18 % — AB (ref 21–57)
IRON: 49 ug/dL (ref 41–142)
TIBC: 280 ug/dL (ref 236–444)
UIBC: 231 ug/dL (ref 120–384)

## 2014-11-20 LAB — FERRITIN CHCC: FERRITIN: 302 ng/mL — AB (ref 9–269)

## 2014-11-20 NOTE — Telephone Encounter (Signed)
She told me on 12/15 that she was now agreeable to CPAP and I would like her to see Dr. Elsworth Soho to help arrange.  Would you mind calling pt and re-arranging pulmonary consult if she is now agreeable? Thanks.

## 2014-11-20 NOTE — Telephone Encounter (Signed)
It is in EPIC under chart review, notes tab but I think it is back in 06/2010.

## 2014-11-20 NOTE — Telephone Encounter (Signed)
Called patient to make appointment with Pulmonary dr again and pt refused again

## 2014-11-20 NOTE — Telephone Encounter (Signed)
Please see below. Also 2011 sleep study is available under notes tab in chart review.

## 2014-11-20 NOTE — Telephone Encounter (Signed)
[name], did the report every come over from the sleep center?

## 2014-11-20 NOTE — Progress Notes (Signed)
Hematology and Oncology Follow Up Visit  [name] [id number] 06/07/1941 74 y.o. 11/20/2014   Principle Diagnosis:   Recurrent DVT/PE-lifelong anticoagulation  Ductal carcinoma of the right breast (T1aN0M0)  Insulin-dependent diabetes  Iron deficiency anemia  Current Therapy:    Arimidex 1 mg by mouth daily       Xarelto 15 mg by mouth daily    Interim History:  Ms.  [name] is back for follow-up. She had another Doppler of her left leg in December. This was negative for any DVT. I don't think she's had a DVT now probably for 3 or 4 years.  She is doing well with the Xarelto.  She's having no problems with the Arimidex. Back she does not feel too tired. She enjoyed the holidays.  She's not complaining much in the way of pain.  Her blood sugars have been doing okay.  Medications: Current outpatient prescriptions: amLODipine (NORVASC) 10 MG tablet, TAKE 1 TABLET (10 MG TOTAL) BY MOUTH DAILY., Disp: 30 tablet, Rfl: 1;  anastrozole (ARIMIDEX) 1 MG tablet, Take 1 mg by mouth daily., Disp: , Rfl: ;  B Complex-C (SUPER B COMPLEX PO), Take 1 tablet by mouth daily., Disp: , Rfl: ;  Calcium Carbonate-Vitamin D (CALCIUM-VITAMIN D) 600-200 MG-UNIT CAPS, Take 1 tablet by mouth daily. , Disp: , Rfl:  clopidogrel (PLAVIX) 75 MG tablet, Take 1 tablet (75 mg total) by mouth daily., Disp: 30 tablet, Rfl: 12;  furosemide (LASIX) 20 MG tablet, TAKE 1 TABLET (20 MG TOTAL) BY MOUTH DAILY AS NEEDED., Disp: 30 tablet, Rfl: 1;  Glucosamine-Chondroitin (GLUCOSAMINE CHONDR COMPLEX PO), Take 1 capsule by mouth daily., Disp: , Rfl: ;  insulin detemir (LEVEMIR) 100 UNIT/ML injection, Inject 25 Units into the skin 2 (two) times daily. , Disp: , Rfl:  insulin regular (NOVOLIN R,HUMULIN R) 100 units/mL injection, Inject into the skin 3 (three) times daily before meals. Sliding scale 16-30  Units: Humulin, Disp: , Rfl: ;  lisinopril (PRINIVIL,ZESTRIL) 40 MG tablet, TAKE 1 TABLET (40 MG TOTAL) BY MOUTH  DAILY., Disp: 30 tablet, Rfl: 1;  metoprolol succinate (TOPROL-XL) 50 MG 24 hr tablet, Take 1 tablet (50 mg total) by mouth daily. Take with or immediately following a meal., Disp: 30 tablet, Rfl: 2 mirabegron ER (MYRBETRIQ) 50 MG TB24 tablet, Take 50 mg by mouth daily. Started 8/26. (7 day package) Started 2nd package (7 days each) on 9/2., Disp: , Rfl: ;  nitroGLYCERIN (NITROSTAT) 0.4 MG SL tablet, Place 1 tablet (0.4 mg total) under the tongue every 5 (five) minutes as needed for chest pain (up to 3 doses - if no relief, call 911)., Disp: 25 tablet, Rfl: 3 nystatin ointment (MYCOSTATIN), Apply 1 application topically 2 (two) times daily as needed., Disp: 30 g, Rfl: 0;  pravastatin (PRAVACHOL) 40 MG tablet, Take 1 tablet (40 mg total) by mouth every evening., Disp: 90 tablet, Rfl: 3;  Rivaroxaban (XARELTO) 15 MG TABS tablet, Take 1 tablet (15 mg total) by mouth daily with supper., Disp: 30 tablet, Rfl: 11 No current facility-administered medications for this visit. Facility-Administered Medications Ordered in Other Visits: silver sulfADIAZINE (SILVADENE) 1 % cream, , Topical, Daily, Lora Paula, MD  Allergies:  Allergies  Allergen Reactions  . Metformin Diarrhea  . Pioglitazone Other (See Comments)    weight gain  . Vancomycin Rash    Erythematous rash, hands and toes became cyanotic.      Past Medical History, Surgical history, Social history, and Family History were reviewed and updated.  Review of Systems: As above  Physical Exam:  height is 5\' 6"  (1.676 m) and weight is 204 lb (92.534 kg). Her oral temperature is 98.2 F (36.8 C). Her blood pressure is 175/54 and her pulse is 62. Her respiration is 16.   Well-developed and well-nourished white female. Head and neck exam shows no ocular or oral lesions. There are no palpable cervical or supraclavicular lymph nodes. Lungs are clear. Card exam regular in rhythm with no murmurs, rubs or bruits. Abdomen is soft. She has good bowel  sounds. There is no fluid wave. There is no palpable liver or spleen tip. Back exam shows no tenderness over the spine, ribs or hips. Extremities shows no clubbing, cyanosis or edema. She is a negative Homans sign with her legs. She has good range of motion of her joints. Skin exam shows no rashes, ecchymoses or petechia.  Lab Results  Component Value Date   WBC 6.1 11/20/2014   HGB 11.7 11/20/2014   HCT 36.0 11/20/2014   MCV 84 11/20/2014   PLT 189 11/20/2014     Chemistry      Component Value Date/Time   NA 139 11/20/2014 1129   NA 141 12/12/2013 1537   K 4.2 11/20/2014 1129   K 4.2 12/12/2013 1537   CL 102 11/20/2014 1129   CO2 27 11/20/2014 1129   CO2 27 12/12/2013 1537   BUN 12 11/20/2014 1129   BUN 11.0 12/12/2013 1537   CREATININE 0.82 11/20/2014 1129   CREATININE 0.93 07/10/2014 1023   CREATININE 0.9 12/12/2013 1537      Component Value Date/Time   CALCIUM 9.3 11/20/2014 1129   CALCIUM 9.3 12/12/2013 1537   ALKPHOS 85 11/20/2014 1129   ALKPHOS 92 12/12/2013 1537   AST 21 11/20/2014 1129   AST 28 12/12/2013 1537   ALT 21 11/20/2014 1129   ALT 31 12/12/2013 1537   BILITOT 0.6 11/20/2014 1129   BILITOT 0.60 12/12/2013 1537         Impression and Plan: [name] is 74 year old white female. She is a lifelong anticoagulation.  For now, we will continue her on the Xarelto. It is possible that we may consider cutting back the dose a little bit while see her back. We may want to consider 10 mg a day dosing.  I don't see any problems with her and the history of breast cancer area and also any evidence of recurrent disease with breast cancer.  We will plan to her back to see [place] in about 3 months.   Volanda Napoleon, MD 1/4/20165:47 PM

## 2014-11-20 NOTE — Telephone Encounter (Signed)
Patient refused appointment with Pulmonary. Please advise.

## 2014-11-21 ENCOUNTER — Telehealth: Payer: Self-pay

## 2014-11-21 NOTE — Telephone Encounter (Signed)
LVM to call back. [name] does not have any record of pt having an eye exam at either location.

## 2014-12-25 ENCOUNTER — Other Ambulatory Visit: Payer: Self-pay

## 2014-12-25 ENCOUNTER — Telehealth: Payer: Self-pay | Admitting: *Deleted

## 2014-12-25 MED ORDER — ANASTROZOLE 1 MG PO TABS: 1.0000 mg | ORAL_TABLET | Freq: Every day | ORAL | Status: DC

## 2014-12-25 NOTE — Telephone Encounter (Signed)
Received refill request for anastrozole tablets - patient was previously seen by Dr. Humphrey Rolls. According to EMR patient sees Dr. Marin Olp now. Called and left voicemail for Dr. Antonieta Pert nurse stating that patient needs refill.

## 2014-12-26 ENCOUNTER — Other Ambulatory Visit: Payer: Self-pay | Admitting: Family

## 2015-01-04 ENCOUNTER — Other Ambulatory Visit: Payer: Self-pay | Admitting: Family

## 2015-02-02 ENCOUNTER — Ambulatory Visit (INDEPENDENT_AMBULATORY_CARE_PROVIDER_SITE_OTHER): Payer: PPO | Admitting: Family

## 2015-02-02 VITALS — BP 110/56 | HR 64 | Temp 97.8°F | Resp 16 | Ht 66.5 in | Wt 199.6 lb

## 2015-02-02 DIAGNOSIS — IMO0001 Reserved for inherently not codable concepts without codable children: Secondary | ICD-10-CM

## 2015-02-02 DIAGNOSIS — E118 Type 2 diabetes mellitus with unspecified complications: Secondary | ICD-10-CM | POA: Diagnosis not present

## 2015-02-02 DIAGNOSIS — IMO0002 Reserved for concepts with insufficient information to code with codable children: Secondary | ICD-10-CM

## 2015-02-02 DIAGNOSIS — E1165 Type 2 diabetes mellitus with hyperglycemia: Secondary | ICD-10-CM | POA: Diagnosis not present

## 2015-02-02 DIAGNOSIS — I10 Essential (primary) hypertension: Secondary | ICD-10-CM | POA: Diagnosis not present

## 2015-02-02 LAB — BASIC METABOLIC PANEL
BUN: 17 mg/dL (ref 6–23)
CO2: 28 mEq/L (ref 19–32)
Calcium: 9.5 mg/dL (ref 8.4–10.5)
Chloride: 104 mEq/L (ref 96–112)
Creatinine, Ser: 0.83 mg/dL (ref 0.40–1.20)
GFR: 71.3 mL/min (ref >60.00)
Glucose, Bld: 190 mg/dL — ABNORMAL HIGH (ref 70–99)
Potassium: 3.6 mEq/L (ref 3.5–5.1)
SODIUM: 137 meq/L (ref 135–145)

## 2015-02-02 NOTE — Patient Instructions (Signed)
Please complete lab work prior to leaving. Schedule diabetic eye exam at your earliest convenience. Please schedule a follow up appointment in 3 months.

## 2015-02-02 NOTE — Progress Notes (Signed)
Pre visit review using our clinic review tool, if applicable. No additional management support is needed unless otherwise documented below in the visit note. 

## 2015-02-02 NOTE — Progress Notes (Signed)
Subjective:    Patient ID: [name], female    DOB: 1941-07-11, 74 y.o.   MRN: [id number]  HPI  [name] is a 74 yr old female who presents today for follow up.  Patient presents today for follow up of multiple medical problems.  Diabetes Type 2  Pt is currently maintained on the following medications for diabetes:lantus novolin, r, reports that she is up to date with endo  Lab Results  Component Value Date   HGBA1C 9.3* 07/22/2014   HGBA1C 9.1* 06/29/2014   HGBA1C 10.2* 05/09/2014    Lab Results  Component Value Date   MICROALBUR 5.29* 07/22/2013   LDLCALC 60 09/06/2014   CREATININE 0.82 11/20/2014    Last diabetic eye exam was  Lab Results  Component Value Date   HMDIABEYEEXA Due 03/10/2011   Denies polyuria/polydipsia. Denies hypoglycemia Home glucose readings range fasting- 151 today, usually 120, after meals as high as the 400's.    Hyperlipidemia  Patient is currently maintained on the following medication for hyperlipidemia: pravastatin Last lipid panel as follows:  Lab Results  Component Value Date   CHOL 114 09/06/2014   HDL 32* 09/06/2014   LDLCALC 60 09/06/2014   TRIG 112 09/06/2014   CHOLHDL 3.6 09/06/2014   Patient denies myalgia. Patient reports good compliance with low fat/low cholesterol diet.   Hypertension  Patient is currently maintained on the following medications for blood pressure: amlodipine, lisinopril, metoprolol Patient reports good compliance with blood pressure medications. Patient denies current chest pain, shortness of breath. Chronic LE swelling- unchanged. Takes lasix. Last 3 blood pressure readings in our office are as follows: BP Readings from Last 3 Encounters:  02/02/15 110/56  11/20/14 175/54  10/31/14 158/68    Review of Systems    see HPI  Past Medical History  Diagnosis Date  . Hyperplastic colon polyp   . Diabetes mellitus type II   . Osteoarthritis   . GERD (gastroesophageal reflux  disease)   . Osteopenia   . Lupus     of skin see dermatologist frequently  . OA (osteoarthritis of spine)     C-spine  . DVT (deep venous thrombosis)     a. right leg DVT 05-17-2008. b. h/o leg swelling of left leg 2-10, u/s showed a old clot. c. another DVT reported 07/2011.  . 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 Allergy   . Clotting disorder     hx of PE and DVT  . C. difficile diarrhea     hx of,   . Skin cancer     skin CA  . PONV (postoperative nausea and vomiting)   . Hypertension   . Breast cancer     left ductal breast ca dx 2003 approx, s/p XRt x 6 weeks, released from oncology (per  patient); dx right breast ca 2014 (in remission)  . Refusal of blood transfusions as patient is Jehovah's Witness   . CAD (coronary artery disease)     a. NSTEMI 07/2014: s/p BMS to mid-distal LAD.  . Ischemic cardiomyopathy     a. cath 07/22/2014 EF 35-40%; Echo 07/22/2013 EF 40-98%, grade 1 diastolic dysfunction, large area of apical akinesis.  Marland Kitchen HTN (hypertension)   . Dyslipidemia   . Transaminitis     a. Mild during 07/2014 admission ? due to MI    History   Social History  . Marital Status: Widowed    Spouse Name: N/A  .  Number of Children: 3  . Years of Education: N/A   Occupational History  . Retired    Social History Main Topics  . Smoking status: Never Smoker   . Smokeless tobacco: Never Used     Comment: Never  Used Tobacco  . Alcohol Use: No  . Drug Use: No  . Sexual Activity: Not Currently   Other Topics Concern  . Not on file   Social History Narrative   Single-widow    3 children , 1 in [place] (son, daughter - [place])    [place] to [place] from [place], [place]   Alcohol Use - no      tobacco-- never     Illicit Drug Use - no    Pt is Jehovah's Witness-No blood products   Daughter - [name]           Past Surgical History  Procedure Laterality Date  . Cholecystectomy    . Breast biopsy      left breast  and right breast  . Breast lumpectomy      left breast-ductal carcinoma in situ  . Appendectomy    . Skin cancer excision      from back   . Breast lumpectomy with needle localization and axillary sentinel lymph node bx Right 06/17/2013    Procedure: BREAST LUMPECTOMY WITH NEEDLE LOCALIZATION AND AXILLARY SENTINEL LYMPH NODE BX;  Surgeon: Merrie Roof, MD;  Location: [facility];  Service: General;  Laterality: Right;  . Irrigation and debridement abscess Right 02/18/2014    Procedure: IRRIGATION AND DEBRIDEMENT RIGHT AXILLARY ABSCESS;  Surgeon: Leighton Ruff, MD;  Location: [facility];  Service: General;  Laterality: Right;  . Left heart cath N/A 07/22/2014    Procedure: LEFT HEART CATH;  Surgeon: Lorretta Harp, MD;  Location: [facility] C[facility];  Service: Cardiovascular;  Laterality: N/A;    Family History  Problem Relation Age of Onset  . Diabetes Mother   . Diabetes    . Heart attack Neg Hx   . Colon cancer Neg Hx   . Breast cancer Neg Hx   . Esophageal cancer Neg Hx   . Rectal cancer Neg Hx   . Stomach cancer Neg Hx   . Cancer Father     melanoma  . Diabetes Brother   . Other Brother     amputation    Allergies  Allergen Reactions  . Metformin Diarrhea  . Pioglitazone Other (See Comments)    weight gain  . Vancomycin Rash    Erythematous rash, hands and toes became cyanotic.      Current Outpatient Prescriptions on File Prior to Visit  Medication Sig Dispense Refill  . amLODipine (NORVASC) 10 MG tablet TAKE 1 TABLET (10 MG TOTAL) BY MOUTH DAILY. 30 tablet 2  . anastrozole (ARIMIDEX) 1 MG tablet Take 1 tablet (1 mg total) by mouth daily. 30 tablet 6  . B Complex-C (SUPER B COMPLEX PO) Take 1 tablet by mouth daily.    . Calcium Carbonate-Vitamin D (CALCIUM-VITAMIN D) 600-200 MG-UNIT CAPS Take 1 tablet by mouth daily.     . clopidogrel (PLAVIX) 75 MG tablet Take 1 tablet (75 mg total) by mouth daily. 30 tablet 12  . furosemide (LASIX) 20 MG tablet TAKE 1 TABLET (20 MG TOTAL) BY MOUTH  DAILY AS NEEDED. 30 tablet 2  . Glucosamine-Chondroitin (GLUCOSAMINE CHONDR COMPLEX PO) Take 1 capsule by mouth daily.    . insulin regular (NOVOLIN R,HUMULIN R) 100 units/mL injection  Inject into the skin 3 (three) times daily before meals. Sliding scale 16-30  Units: Humulin    . lisinopril (PRINIVIL,ZESTRIL) 40 MG tablet TAKE 1 TABLET (40 MG TOTAL) BY MOUTH DAILY. 30 tablet 2  . metoprolol succinate (TOPROL-XL) 50 MG 24 hr tablet Take 1 tablet (50 mg total) by mouth daily. Take with or immediately following a meal. 30 tablet 2  . nitroGLYCERIN (NITROSTAT) 0.4 MG SL tablet Place 1 tablet (0.4 mg total) under the tongue every 5 (five) minutes as needed for chest pain (up to 3 doses - if no relief, call 911). 25 tablet 3  . pravastatin (PRAVACHOL) 40 MG tablet Take 1 tablet (40 mg total) by mouth every evening. 90 tablet 3  . Rivaroxaban (XARELTO) 15 MG TABS tablet Take 1 tablet (15 mg total) by mouth daily with supper. 30 tablet 11   Current Facility-Administered Medications on File Prior to Visit  Medication Dose Route Frequency Provider Last Rate Last Dose  . silver sulfADIAZINE (SILVADENE) 1 % cream   Topical Daily Tyler Pita, MD        BP 110/56 mmHg  Pulse 64  Temp(Src) 97.8 F (36.6 C) (Oral)  Resp 16  Ht 5' 6.5" (1.689 m)  Wt 199 lb 9.6 oz (90.538 kg)  BMI 31.74 kg/m2  SpO2 99%    Objective:   Physical Exam  Constitutional: She is oriented to person, place, and time. She appears well-developed and well-nourished.  HENT:  Head: Normocephalic and atraumatic.  Cardiovascular: Normal rate, regular rhythm and normal heart sounds.   No murmur heard. Pulmonary/Chest: Effort normal and breath sounds normal. No respiratory distress. She has no wheezes.  Musculoskeletal:  2-3+bilateral LE edema  Neurological: She is alert and oriented to person, place, and time.  Psychiatric: She has a normal mood and affect. Her behavior is normal. Judgment and thought content normal.           Assessment & Plan:

## 2015-02-03 ENCOUNTER — Encounter: Payer: Self-pay | Admitting: Family

## 2015-02-03 NOTE — Assessment & Plan Note (Signed)
Uncontrolled.  Advised follow up with endo, obtain urine microalbumin and bmet

## 2015-02-03 NOTE — Assessment & Plan Note (Signed)
Stable on current meds.  Continue same. 

## 2015-02-13 ENCOUNTER — Other Ambulatory Visit (INDEPENDENT_AMBULATORY_CARE_PROVIDER_SITE_OTHER): Payer: PPO

## 2015-02-13 DIAGNOSIS — E1310 Other specified diabetes mellitus with ketoacidosis without coma: Secondary | ICD-10-CM | POA: Diagnosis not present

## 2015-02-13 LAB — MICROALBUMIN / CREATININE URINE RATIO
CREATININE, U: 126.7 mg/dL
MICROALB UR: 3.3 mg/dL — AB (ref 0.0–1.9)
Microalb Creat Ratio: 2.6 mg/g (ref 0.0–30.0)

## 2015-02-13 NOTE — Addendum Note (Signed)
Addended by: Peggyann Shoals on: 02/13/2015 12:22 PM   Modules accepted: Orders

## 2015-02-19 ENCOUNTER — Telehealth: Payer: Self-pay | Admitting: Family

## 2015-02-19 ENCOUNTER — Encounter: Payer: Self-pay | Admitting: *Deleted

## 2015-02-19 ENCOUNTER — Ambulatory Visit (HOSPITAL_BASED_OUTPATIENT_CLINIC_OR_DEPARTMENT_OTHER): Payer: PPO | Admitting: Family

## 2015-02-19 ENCOUNTER — Other Ambulatory Visit (HOSPITAL_BASED_OUTPATIENT_CLINIC_OR_DEPARTMENT_OTHER): Payer: PPO

## 2015-02-19 ENCOUNTER — Encounter: Payer: Self-pay | Admitting: Pharmacist

## 2015-02-19 VITALS — BP 148/63 | HR 66 | Temp 97.4°F | Wt 207.4 lb

## 2015-02-19 DIAGNOSIS — C50911 Malignant neoplasm of unspecified site of right female breast: Secondary | ICD-10-CM

## 2015-02-19 DIAGNOSIS — Z7901 Long term (current) use of anticoagulants: Secondary | ICD-10-CM

## 2015-02-19 DIAGNOSIS — Z86711 Personal history of pulmonary embolism: Secondary | ICD-10-CM

## 2015-02-19 DIAGNOSIS — Z86718 Personal history of other venous thrombosis and embolism: Secondary | ICD-10-CM | POA: Diagnosis not present

## 2015-02-19 DIAGNOSIS — C50219 Malignant neoplasm of upper-inner quadrant of unspecified female breast: Secondary | ICD-10-CM

## 2015-02-19 LAB — CBC WITH DIFFERENTIAL (CANCER CENTER ONLY)
BASO#: 0 10*3/uL (ref 0.0–0.2)
BASO%: 0.1 % (ref 0.0–2.0)
EOS ABS: 0.1 10*3/uL (ref 0.0–0.5)
EOS%: 1.6 % (ref 0.0–7.0)
HEMATOCRIT: 36.5 % (ref 34.8–46.6)
HEMOGLOBIN: 11.8 g/dL (ref 11.6–15.9)
LYMPH#: 1.2 10*3/uL (ref 0.9–3.3)
LYMPH%: 18.4 % (ref 14.0–48.0)
MCH: 27.8 pg (ref 26.0–34.0)
MCHC: 32.3 g/dL (ref 32.0–36.0)
MCV: 86 fL (ref 81–101)
MONO#: 0.6 10*3/uL (ref 0.1–0.9)
MONO%: 8.8 % (ref 0.0–13.0)
NEUT#: 4.7 10*3/uL (ref 1.5–6.5)
NEUT%: 71.1 % (ref 39.6–80.0)
Platelets: 180 10*3/uL (ref 145–400)
RBC: 4.25 10*6/uL (ref 3.70–5.32)
RDW: 13.3 % (ref 11.1–15.7)
WBC: 6.7 10*3/uL (ref 3.9–10.0)

## 2015-02-19 LAB — CMP (CANCER CENTER ONLY)
ALT: 24 U/L (ref 10–47)
AST: 29 U/L (ref 11–38)
Albumin: 3.7 g/dL (ref 3.3–5.5)
Alkaline Phosphatase: 76 U/L (ref 26–84)
BILIRUBIN TOTAL: 0.7 mg/dL (ref 0.20–1.60)
BUN, Bld: 16 mg/dL (ref 7–22)
CHLORIDE: 102 meq/L (ref 98–108)
CO2: 27 meq/L (ref 18–33)
Calcium: 9.8 mg/dL (ref 8.0–10.3)
Creat: 0.9 mg/dl (ref 0.6–1.2)
Glucose, Bld: 206 mg/dL — ABNORMAL HIGH (ref 73–118)
Potassium: 4.2 mEq/L (ref 3.3–4.7)
SODIUM: 143 meq/L (ref 128–145)
TOTAL PROTEIN: 7.6 g/dL (ref 6.4–8.1)

## 2015-02-19 NOTE — Telephone Encounter (Signed)
Attempted to reach pt at home # and daughter advised me to call pt's cell# as pt is not at home. Left message on cell# to return my call.

## 2015-02-19 NOTE — Progress Notes (Signed)
Hematology and Oncology Follow Up Visit  [name] [id number] Feb 23, 1941 74 y.o. 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 She is doing well on Xarelto and has had no episodes of bleeding.  [place] of her left leg was negative for DVT in December.  She is still wearing her compression stockings all the time and this has really helped with the swelling in her legs.  She is taking her Arimidex daily and has had no problems.  She denies fever, chills, n/v, cough, rash, headache, dizziness, SOB, chest pain, palpitations, abdominal pain, diarrhea, blood in urine or stool. She has constipation at times that is relieved with stool softeners.   No swelling, tenderness, numbness or tingling in her extremities. No new aches or pains.  Her appetite is good and she is staying hydrated. Her weight is stable.   Medications:    Medication List       This list is accurate as of: 02/19/15 10:56 AM.  Always use your most recent med list.               amLODipine 10 MG tablet  Commonly known as:  NORVASC  TAKE 1 TABLET (10 MG TOTAL) BY MOUTH DAILY.     anastrozole 1 MG tablet  Commonly known as:  ARIMIDEX  Take 1 tablet (1 mg total) by mouth daily.     Calcium-Vitamin D 600-200 MG-UNIT Caps  Take 1 tablet by mouth daily.     clopidogrel 75 MG tablet  Commonly known as:  PLAVIX  Take 1 tablet (75 mg total) by mouth daily.     furosemide 20 MG tablet  Commonly known as:  LASIX  TAKE 1 TABLET (20 MG TOTAL) BY MOUTH DAILY AS NEEDED.     GLUCOSAMINE CHONDR COMPLEX PO  Take 1 capsule by mouth daily.     insulin glargine 100 UNIT/ML injection  Commonly known as:  LANTUS  Take 15 units at lunch and 30 units  at bedtime.     insulin regular 100 units/mL injection  Commonly known as:  NOVOLIN R,HUMULIN R  Inject into the skin 3 (three) times daily before meals. Sliding scale 16-30  Units: Humulin     lisinopril 40 MG tablet  Commonly known as:  PRINIVIL,ZESTRIL  TAKE 1 TABLET (40 MG TOTAL) BY MOUTH DAILY.     metoprolol succinate 50 MG 24 hr tablet  Commonly known as:  TOPROL-XL  Take 1 tablet (50 mg total) by mouth daily. Take with or immediately following a meal.     nitroGLYCERIN 0.4 MG SL tablet  Commonly known as:  NITROSTAT  Place 1 tablet (0.4 mg total) under the tongue every 5 (five) minutes as needed for chest pain (up to 3 doses - if no relief, call 911).     pravastatin 40 MG tablet  Commonly known as:  PRAVACHOL  Take 1 tablet (40 mg total) by mouth every evening.     Rivaroxaban 15 MG Tabs tablet  Commonly known as:  XARELTO  Take 1 tablet (15 mg total) by mouth daily with supper.     SUPER B COMPLEX PO  Take 1 tablet by mouth daily.        Allergies:  Allergies  Allergen Reactions  .  Metformin Diarrhea  . Pioglitazone Other (See Comments)    weight gain  . Vancomycin Rash    Erythematous rash, hands and toes became cyanotic.      Past Medical History, Surgical history, Social history, and Family History were reviewed and updated.  Review of Systems: All other 10 point review of systems is negative.   Physical Exam:  weight is 207 lb 6.4 oz (94.076 kg). Her oral temperature is 97.4 F (36.3 C). Her blood pressure is 148/63 and her pulse is 66.   Wt Readings from Last 3 Encounters:  02/19/15 207 lb 6.4 oz (94.076 kg)  02/02/15 199 lb 9.6 oz (90.538 kg)  11/20/14 204 lb (92.534 kg)    Ocular: Sclerae unicteric, pupils equal, round and reactive to light Ear-nose-throat: Oropharynx clear, dentition fair Lymphatic: No cervical or supraclavicular adenopathy Lungs no rales or rhonchi, good excursion bilaterally Heart regular rate and rhythm, no murmur  appreciated Abd soft, nontender, positive bowel sounds MSK no focal spinal tenderness, no joint edema Neuro: non-focal, well-oriented, appropriate affect Breasts: No changes. No mass, lesions, rash or lymphadenopathy.   Lab Results  Component Value Date   WBC 6.7 02/19/2015   HGB 11.8 02/19/2015   HCT 36.5 02/19/2015   MCV 86 02/19/2015   PLT 180 02/19/2015   Lab Results  Component Value Date   FERRITIN 302* 11/20/2014   IRON 49 11/20/2014   TIBC 280 11/20/2014   UIBC 231 11/20/2014   IRONPCTSAT 18* 11/20/2014   Lab Results  Component Value Date   RBC 4.25 02/19/2015   No results found for: KPAFRELGTCHN, LAMBDASER, KAPLAMBRATIO No results found for: IGGSERUM, IGA, IGMSERUM No results found for: Odetta Pink, SPEI   Chemistry      Component Value Date/Time   NA 137 02/02/2015 1214   NA 141 12/12/2013 1537   K 3.6 02/02/2015 1214   K 4.2 12/12/2013 1537   CL 104 02/02/2015 1214   CO2 28 02/02/2015 1214   CO2 27 12/12/2013 1537   BUN 17 02/02/2015 1214   BUN 11.0 12/12/2013 1537   CREATININE 0.83 02/02/2015 1214   CREATININE 0.93 07/10/2014 1023   CREATININE 0.9 12/12/2013 1537      Component Value Date/Time   CALCIUM 9.5 02/02/2015 1214   CALCIUM 9.3 12/12/2013 1537   ALKPHOS 85 11/20/2014 1129   ALKPHOS 92 12/12/2013 1537   AST 21 11/20/2014 1129   AST 28 12/12/2013 1537   ALT 21 11/20/2014 1129   ALT 31 12/12/2013 1537   BILITOT 0.6 11/20/2014 1129   BILITOT 0.60 12/12/2013 1537     Impression and Plan: [name] is 74 year old white female with history of recurrent DVT/PE. She is now on lifelong anticoagulation with [name]. She is doing well and is asymptomatic at this time.  She also has history of ductal carcinoma of the right breast. She is doing well on Arimidex and has shown no evidence of recurrence.  We will see her back in 3 months for labs and follow-up.  She knows to call here with any  questions or concerns. We can certainly see her sooner if need be.   Eliezer Bottom, NP 4/4/201610:56 AM

## 2015-02-19 NOTE — Progress Notes (Signed)
Xarelto samples given:    15 mg   #3 bottles of 14 tablets each.                                                       Lot:  [id number]                                                        Exp:  12/17/16

## 2015-02-19 NOTE — Telephone Encounter (Signed)
[name], Bion[place] [birth year] Pt came in because she said that she spoke with you last week and you told her to call/come by to discuss some labs with her. Pt stated that she couldn't wait much longer and that you can call her back to discuss the labs with her. Pt was informed that you were busy gettting a patient settled in a room, but that I would let you know that she came by. I looked in the chart and I didn't see anything stating that you called her or that she needed to come to talk about those labs.  [phone] is the best way to reach her to get in touch with her.   [name]  02-19-15

## 2015-02-20 LAB — D-DIMER, QUANTITATIVE: D-Dimer, Quant: 0.43 ug/mL-FEU (ref 0.00–0.48)

## 2015-02-20 NOTE — Telephone Encounter (Signed)
Left message on cell# to return my call.

## 2015-02-27 ENCOUNTER — Telehealth: Payer: Self-pay | Admitting: Family

## 2015-02-27 ENCOUNTER — Other Ambulatory Visit: Payer: PPO

## 2015-02-27 NOTE — Telephone Encounter (Signed)
Left message for pt to return my call.

## 2015-02-27 NOTE — Telephone Encounter (Signed)
Caller name:Vilda Ernst Bowler Relationship to patient: Self Can be reached:[phone]   Reason for call: PT returning Tricia's call RE: results

## 2015-02-27 NOTE — Telephone Encounter (Signed)
Caller name: Abreanna Relation to pt: self Call back number: [phone] Pharmacy:  Reason for call:   Requesting last lab results

## 2015-02-27 NOTE — Telephone Encounter (Signed)
Attempted to reach pt and left message for pt to return my call. 

## 2015-02-28 NOTE — Telephone Encounter (Signed)
Attempted to reach pt and left message to return my call. 

## 2015-03-02 ENCOUNTER — Other Ambulatory Visit: Payer: Self-pay | Admitting: Nurse Practitioner

## 2015-03-06 ENCOUNTER — Telehealth: Payer: Self-pay | Admitting: *Deleted

## 2015-03-06 NOTE — Telephone Encounter (Signed)
Received refill request from [facility] for metoprolol succinate 25mg , 1 tablet daily. Current med list is 50mg  1 tablet daily. Unable to reach pt to verify how she is currently taking Rx.  Will call pt tomorrow to verify.  Spoke with Rodman Key at [facility] and he states 25mg  Rx was sent on 08/04/14 by Truitt Merle. Pt picked up 50mg  Rx on 10/2014 then filled 25mg  Rx on 03/02/15.  What dose is pt supposed to be taking? Please advise.

## 2015-03-06 NOTE — Telephone Encounter (Signed)
Left message for pt to return my call and tell questions she has to telephone operator when she calls back if I am not available.

## 2015-03-07 MED ORDER — METOPROLOL SUCCINATE ER 50 MG PO TB24: 50.0000 mg | ORAL_TABLET | Freq: Every day | ORAL | Status: DC

## 2015-03-07 NOTE — Telephone Encounter (Signed)
50 mg please

## 2015-03-07 NOTE — Telephone Encounter (Signed)
Left detailed message on cell# that pt should be taking metoprolol 50mg  daily and to call if any questions. Rx sent to pharmacy with note to D/C 25mg  RX.

## 2015-03-09 ENCOUNTER — Encounter: Payer: Self-pay | Admitting: Nurse Practitioner

## 2015-03-09 NOTE — Progress Notes (Signed)
Patient is aware that she must complete paperwork to obtain assistance for her Xarelto. She is unable to pay for this script monthly and drug rep has assured [place] that she would get some sort of assistance once all of the information is completed for the [facility] and [place] application. Baxter Flattery has the paperwork and the patient is aware that we can not continue to supply her with samples for her monthly maintenance medications.

## 2015-03-12 ENCOUNTER — Telehealth: Payer: Self-pay | Admitting: Nurse Practitioner

## 2015-03-12 NOTE — Telephone Encounter (Signed)
All financial info and documentation sent to [employer] for approval for assistance with Xarelto. Confirmation received.

## 2015-03-15 ENCOUNTER — Telehealth: Payer: Self-pay | Admitting: Family

## 2015-03-15 NOTE — Telephone Encounter (Signed)
Caller name: Rhodie Relation to pt: Self Call back number: [phone] try to call twice since pt states phone not working well. Pharmacy:  Reason for call: Pt came in office again trying to get in contact with Gilmore Laroche to consult that her med metoprolol succinate (TOPROL-XL) 50 MG 24 hr tablet is not making her feel good, making her weak and tried. Pt stated that her rx was 25 mg before and it was changed to 50mg  since then she is not feeling good. Please advise.

## 2015-03-16 NOTE — Telephone Encounter (Signed)
LM for patient to return the call.  

## 2015-03-20 NOTE — Telephone Encounter (Signed)
Left message on pt's voicemail to call the office and schedule a follow up for further evaluation.

## 2015-03-21 ENCOUNTER — Encounter: Payer: Self-pay | Admitting: Cardiology

## 2015-03-21 ENCOUNTER — Ambulatory Visit (INDEPENDENT_AMBULATORY_CARE_PROVIDER_SITE_OTHER): Payer: PPO | Admitting: Cardiology

## 2015-03-21 VITALS — BP 138/64 | HR 63 | Ht 66.5 in | Wt 203.0 lb

## 2015-03-21 DIAGNOSIS — E785 Hyperlipidemia, unspecified: Secondary | ICD-10-CM

## 2015-03-21 DIAGNOSIS — I2510 Atherosclerotic heart disease of native coronary artery without angina pectoris: Secondary | ICD-10-CM

## 2015-03-21 DIAGNOSIS — I10 Essential (primary) hypertension: Secondary | ICD-10-CM

## 2015-03-21 DIAGNOSIS — I255 Ischemic cardiomyopathy: Secondary | ICD-10-CM | POA: Diagnosis not present

## 2015-03-21 DIAGNOSIS — I2583 Coronary atherosclerosis due to lipid rich plaque: Secondary | ICD-10-CM

## 2015-03-21 DIAGNOSIS — Z7901 Long term (current) use of anticoagulants: Secondary | ICD-10-CM

## 2015-03-21 MED ORDER — METOPROLOL SUCCINATE ER 25 MG PO TB24: 25.0000 mg | ORAL_TABLET | Freq: Every day | ORAL | Status: DC

## 2015-03-21 NOTE — Assessment & Plan Note (Signed)
- 

## 2015-03-21 NOTE — Progress Notes (Signed)
HPI: FU CAD. Patient admitted in September 2015 with myocardial infarction. Cardiac catheterization revealed a normal left main, 95% mid to distal LAD, normal circumflex and normal right coronary artery. Ejection fraction 35-40%. Patient had bare metal stent to LAD. Echocardiogram September 2015 showed an ejection fraction of 40-45% with akinesis of the apex. There was mild left atrial enlargement and grade 1 diastolic dysfunction. Patient also on chronic anticoagulation for previous DVT and pulmonary embolus. Since last seen,  There is no significant dyspnea on exertion. She had a brief episode of chest pain several weeks ago that she attributes to her dog pulling her. Lasted several minutes and resolve spontaneously. No exertional chest pain. Chronic pedal edema.  Current Outpatient Prescriptions  Medication Sig Dispense Refill  . amLODipine (NORVASC) 10 MG tablet TAKE 1 TABLET (10 MG TOTAL) BY MOUTH DAILY. 30 tablet 2  . anastrozole (ARIMIDEX) 1 MG tablet Take 1 tablet (1 mg total) by mouth daily. 30 tablet 6  . B Complex-C (SUPER B COMPLEX PO) Take 1 tablet by mouth daily.    . Calcium Carbonate-Vitamin D (CALCIUM-VITAMIN D) 600-200 MG-UNIT CAPS Take 1 tablet by mouth daily.     . clopidogrel (PLAVIX) 75 MG tablet Take 1 tablet (75 mg total) by mouth daily. 30 tablet 12  . furosemide (LASIX) 20 MG tablet TAKE 1 TABLET (20 MG TOTAL) BY MOUTH DAILY AS NEEDED. 30 tablet 2  . Glucosamine-Chondroitin (GLUCOSAMINE CHONDR COMPLEX PO) Take 1 capsule by mouth daily.    Marland Kitchen GLUCOSAMINE-CHONDROITIN PO Take by mouth.    . insulin glargine (LANTUS) 100 UNIT/ML injection Take 15 units at lunch and 30 units at bedtime.    . insulin regular (NOVOLIN R,HUMULIN R) 100 units/mL injection Inject into the skin 3 (three) times daily before meals. Sliding scale 16-30  Units: Humulin    . lisinopril (PRINIVIL,ZESTRIL) 40 MG tablet TAKE 1 TABLET (40 MG TOTAL) BY MOUTH DAILY. 30 tablet 2  . metoprolol succinate  (TOPROL-XL) 50 MG 24 hr tablet Take 1 tablet (50 mg total) by mouth daily. Take with or immediately following a meal. 30 tablet 2  . mirabegron ER (MYRBETRIQ) 50 MG TB24 tablet Take 50 mg by mouth daily.    . nitroGLYCERIN (NITROSTAT) 0.4 MG SL tablet Place 1 tablet (0.4 mg total) under the tongue every 5 (five) minutes as needed for chest pain (up to 3 doses - if no relief, call 911). 25 tablet 3  . pravastatin (PRAVACHOL) 40 MG tablet Take 1 tablet (40 mg total) by mouth every evening. 90 tablet 3  . Rivaroxaban (XARELTO) 15 MG TABS tablet Take 1 tablet (15 mg total) by mouth daily with supper. 30 tablet 11   No current facility-administered medications for this visit.   Facility-Administered Medications Ordered in Other Visits  Medication Dose Route Frequency Provider Last Rate Last Dose  . silver sulfADIAZINE (SILVADENE) 1 % cream   Topical Daily Tyler Pita, MD         Past Medical History  Diagnosis Date  . Hyperplastic colon polyp   . Diabetes mellitus type II   . Osteoarthritis   . GERD (gastroesophageal reflux disease)   . Osteopenia   . Lupus     of skin see dermatologist frequently  . OA (osteoarthritis of spine)     C-spine  . DVT (deep venous thrombosis)     a. right leg DVT 05-17-2008. b. h/o leg swelling of left leg 2-10, u/s showed a old clot. c.  another DVT reported 07/2011.  . 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 Allergy   . Clotting disorder     hx of PE and DVT  . C. difficile diarrhea     hx of,   . Skin cancer     skin CA  . PONV (postoperative nausea and vomiting)   . Hypertension   . Breast cancer     left ductal breast ca dx 2003 approx, s/p XRt x 6 weeks, released from oncology (per  patient); dx right breast ca 2014 (in remission)  . Refusal of blood transfusions as patient is Jehovah's Witness   . CAD (coronary artery disease)     a. NSTEMI 07/2014: s/p BMS to mid-distal LAD.  . Ischemic  cardiomyopathy     a. cath 07/22/2014 EF 35-40%; Echo 07/22/2013 EF 06-26%, grade 1 diastolic dysfunction, large area of apical akinesis.  Marland Kitchen HTN (hypertension)   . Dyslipidemia   . Transaminitis     a. Mild during 07/2014 admission ? due to MI    Past Surgical History  Procedure Laterality Date  . Cholecystectomy    . Breast biopsy      left breast and right breast  . Breast lumpectomy      left breast-ductal carcinoma in situ  . Appendectomy    . Skin cancer excision      from back   . Breast lumpectomy with needle localization and axillary sentinel lymph node bx Right 06/17/2013    Procedure: BREAST LUMPECTOMY WITH NEEDLE LOCALIZATION AND AXILLARY SENTINEL LYMPH NODE BX;  Surgeon: Merrie Roof, MD;  Location: [facility];  Service: General;  Laterality: Right;  . Irrigation and debridement abscess Right 02/18/2014    Procedure: IRRIGATION AND DEBRIDEMENT RIGHT AXILLARY ABSCESS;  Surgeon: Leighton Ruff, MD;  Location: [facility];  Service: General;  Laterality: Right;  . Left heart cath N/A 07/22/2014    Procedure: LEFT HEART CATH;  Surgeon: Lorretta Harp, MD;  Location: [facility] CATH [facility];  Service: Cardiovascular;  Laterality: N/A;    History   Social History  . Marital Status: Widowed    Spouse Name: N/A  . Number of Children: 3  . Years of Education: N/A   Occupational History  . Retired    Social History Main Topics  . Smoking status: Never Smoker   . Smokeless tobacco: Never Used     Comment: Never  Used Tobacco  . Alcohol Use: No  . Drug Use: No  . Sexual Activity: Not Currently   Other Topics Concern  . Not on file   Social History Narrative   Single-widow    3 children , 1 in [place] (son, daughter - [place])    [place] to [place] from [place], [place]   Alcohol Use - no      tobacco-- never     Illicit Drug Use - no    Pt is Jehovah's Witness-No blood products   Daughter - Adreena Moore           ROS: back pain but no fevers or chills, productive cough, hemoptysis,  dysphasia, odynophagia, melena, hematochezia, dysuria, hematuria, rash, seizure activity, orthopnea, PND, claudication. Remaining systems are negative.  Physical Exam: Well-developed well-nourished in no acute distress.  Skin is warm and dry.  HEENT is normal.  Neck is supple.  Chest is clear to auscultation with normal expansion.  Cardiovascular exam is regular rate and rhythm. 2/6 systolic ejection  murmur Abdominal exam nontender or distended. No masses palpated. Extremities show trace to 1+ edema. neuro grossly intact  ECG sinus rhythm at a rate of 63. Cannot rule out prior inferior infarct. No ST changes.

## 2015-03-21 NOTE — Assessment & Plan Note (Signed)
Blood pressure controlled. Continue present medications. 

## 2015-03-21 NOTE — Assessment & Plan Note (Signed)
Continue ACE inhibitor and beta blocker.repeat echocardiogram to see if LV function has improved following revascularization.

## 2015-03-21 NOTE — Patient Instructions (Signed)

## 2015-03-21 NOTE — Telephone Encounter (Signed)
Pt came in to the office. Just saw Dr Stanford Breed today and he decreased pt's metoprolol dosage to 25mg  daily. Pt requested test results from visit with PCP in March. Lab letters printed and given to pt.

## 2015-03-21 NOTE — Assessment & Plan Note (Signed)
Managed by hematology oncology.

## 2015-03-21 NOTE — Assessment & Plan Note (Signed)
Continue statin. 

## 2015-03-22 ENCOUNTER — Other Ambulatory Visit: Payer: Self-pay | Admitting: Family

## 2015-03-27 ENCOUNTER — Telehealth: Payer: Self-pay | Admitting: Hematology & Oncology

## 2015-03-27 NOTE — Telephone Encounter (Signed)
[employer] has APPROVED the XARELTO tabs and is good until 10/04/2015. Pts   co-pay is $45.   Pt applied for [employer] Patient Assistance foundation and was DENIED.      COPY SCANNED

## 2015-03-28 ENCOUNTER — Other Ambulatory Visit (HOSPITAL_BASED_OUTPATIENT_CLINIC_OR_DEPARTMENT_OTHER): Payer: PPO

## 2015-03-28 ENCOUNTER — Ambulatory Visit (HOSPITAL_BASED_OUTPATIENT_CLINIC_OR_DEPARTMENT_OTHER)
Admission: RE | Admit: 2015-03-28 | Discharge: 2015-03-28 | Disposition: A | Discharge status: Home / Self Care | Payer: PPO | Source: Ambulatory Visit | Attending: Cardiology | Admitting: Cardiology

## 2015-03-28 DIAGNOSIS — I2510 Atherosclerotic heart disease of native coronary artery without angina pectoris: Secondary | ICD-10-CM | POA: Diagnosis not present

## 2015-03-28 DIAGNOSIS — Z888 Allergy status to other drugs, medicaments and biological substances status: Secondary | ICD-10-CM

## 2015-03-28 DIAGNOSIS — I872 Venous insufficiency (chronic) (peripheral): Secondary | ICD-10-CM | POA: Diagnosis present

## 2015-03-28 DIAGNOSIS — E785 Hyperlipidemia, unspecified: Secondary | ICD-10-CM | POA: Diagnosis present

## 2015-03-28 DIAGNOSIS — Z881 Allergy status to other antibiotic agents status: Secondary | ICD-10-CM

## 2015-03-28 DIAGNOSIS — Z86718 Personal history of other venous thrombosis and embolism: Secondary | ICD-10-CM

## 2015-03-28 DIAGNOSIS — G629 Polyneuropathy, unspecified: Secondary | ICD-10-CM | POA: Diagnosis present

## 2015-03-28 DIAGNOSIS — G9341 Metabolic encephalopathy: Secondary | ICD-10-CM | POA: Diagnosis present

## 2015-03-28 DIAGNOSIS — Z7902 Long term (current) use of antithrombotics/antiplatelets: Secondary | ICD-10-CM

## 2015-03-28 DIAGNOSIS — A419 Sepsis, unspecified organism: Principal | ICD-10-CM | POA: Diagnosis present

## 2015-03-28 DIAGNOSIS — M1990 Unspecified osteoarthritis, unspecified site: Secondary | ICD-10-CM | POA: Diagnosis present

## 2015-03-28 DIAGNOSIS — M549 Dorsalgia, unspecified: Secondary | ICD-10-CM | POA: Diagnosis present

## 2015-03-28 DIAGNOSIS — K219 Gastro-esophageal reflux disease without esophagitis: Secondary | ICD-10-CM | POA: Diagnosis present

## 2015-03-28 DIAGNOSIS — N390 Urinary tract infection, site not specified: Secondary | ICD-10-CM | POA: Diagnosis present

## 2015-03-28 DIAGNOSIS — M8580 Other specified disorders of bone density and structure, unspecified site: Secondary | ICD-10-CM | POA: Diagnosis present

## 2015-03-28 DIAGNOSIS — Z7901 Long term (current) use of anticoagulants: Secondary | ICD-10-CM

## 2015-03-28 DIAGNOSIS — Z85820 Personal history of malignant melanoma of skin: Secondary | ICD-10-CM

## 2015-03-28 DIAGNOSIS — G8929 Other chronic pain: Secondary | ICD-10-CM | POA: Diagnosis present

## 2015-03-28 DIAGNOSIS — Z86711 Personal history of pulmonary embolism: Secondary | ICD-10-CM

## 2015-03-28 DIAGNOSIS — Z794 Long term (current) use of insulin: Secondary | ICD-10-CM

## 2015-03-28 DIAGNOSIS — I10 Essential (primary) hypertension: Secondary | ICD-10-CM | POA: Diagnosis present

## 2015-03-28 DIAGNOSIS — G4733 Obstructive sleep apnea (adult) (pediatric): Secondary | ICD-10-CM | POA: Diagnosis present

## 2015-03-28 DIAGNOSIS — Z86010 Personal history of colonic polyps: Secondary | ICD-10-CM

## 2015-03-28 DIAGNOSIS — R4182 Altered mental status, unspecified: Secondary | ICD-10-CM | POA: Diagnosis not present

## 2015-03-28 DIAGNOSIS — I255 Ischemic cardiomyopathy: Secondary | ICD-10-CM | POA: Diagnosis present

## 2015-03-28 DIAGNOSIS — E1165 Type 2 diabetes mellitus with hyperglycemia: Secondary | ICD-10-CM | POA: Diagnosis present

## 2015-03-28 DIAGNOSIS — I5032 Chronic diastolic (congestive) heart failure: Secondary | ICD-10-CM | POA: Diagnosis present

## 2015-03-28 DIAGNOSIS — Z853 Personal history of malignant neoplasm of breast: Secondary | ICD-10-CM

## 2015-03-28 DIAGNOSIS — Z9049 Acquired absence of other specified parts of digestive tract: Secondary | ICD-10-CM | POA: Diagnosis present

## 2015-03-28 NOTE — Progress Notes (Signed)
  Echocardiogram 2D Echocardiogram has been performed.  [name] FRANCES 03/28/2015, 1:43 PM

## 2015-03-29 ENCOUNTER — Emergency Department (HOSPITAL_COMMUNITY): Payer: PPO

## 2015-03-29 ENCOUNTER — Encounter (HOSPITAL_COMMUNITY): Payer: Self-pay

## 2015-03-29 ENCOUNTER — Inpatient Hospital Stay (HOSPITAL_COMMUNITY)
Admission: EM | Admit: 2015-03-29 | Discharge: 2015-04-02 | DRG: 871 | Disposition: A | Discharge status: Home / Self Care | Payer: PPO | Attending: Internal Medicine | Admitting: Internal Medicine

## 2015-03-29 DIAGNOSIS — E1165 Type 2 diabetes mellitus with hyperglycemia: Secondary | ICD-10-CM | POA: Diagnosis present

## 2015-03-29 DIAGNOSIS — E119 Type 2 diabetes mellitus without complications: Secondary | ICD-10-CM

## 2015-03-29 DIAGNOSIS — I872 Venous insufficiency (chronic) (peripheral): Secondary | ICD-10-CM | POA: Diagnosis present

## 2015-03-29 DIAGNOSIS — I5032 Chronic diastolic (congestive) heart failure: Secondary | ICD-10-CM

## 2015-03-29 DIAGNOSIS — I5030 Unspecified diastolic (congestive) heart failure: Secondary | ICD-10-CM | POA: Insufficient documentation

## 2015-03-29 DIAGNOSIS — I10 Essential (primary) hypertension: Secondary | ICD-10-CM | POA: Diagnosis present

## 2015-03-29 DIAGNOSIS — A419 Sepsis, unspecified organism: Principal | ICD-10-CM

## 2015-03-29 DIAGNOSIS — E785 Hyperlipidemia, unspecified: Secondary | ICD-10-CM | POA: Diagnosis present

## 2015-03-29 DIAGNOSIS — M545 Low back pain, unspecified: Secondary | ICD-10-CM | POA: Diagnosis present

## 2015-03-29 DIAGNOSIS — I2510 Atherosclerotic heart disease of native coronary artery without angina pectoris: Secondary | ICD-10-CM | POA: Diagnosis present

## 2015-03-29 DIAGNOSIS — Z86718 Personal history of other venous thrombosis and embolism: Secondary | ICD-10-CM

## 2015-03-29 DIAGNOSIS — E118 Type 2 diabetes mellitus with unspecified complications: Secondary | ICD-10-CM | POA: Diagnosis not present

## 2015-03-29 DIAGNOSIS — N390 Urinary tract infection, site not specified: Secondary | ICD-10-CM

## 2015-03-29 DIAGNOSIS — R509 Fever, unspecified: Secondary | ICD-10-CM

## 2015-03-29 DIAGNOSIS — N3281 Overactive bladder: Secondary | ICD-10-CM | POA: Diagnosis present

## 2015-03-29 DIAGNOSIS — IMO0002 Reserved for concepts with insufficient information to code with codable children: Secondary | ICD-10-CM

## 2015-03-29 DIAGNOSIS — G4733 Obstructive sleep apnea (adult) (pediatric): Secondary | ICD-10-CM | POA: Diagnosis present

## 2015-03-29 DIAGNOSIS — Z86711 Personal history of pulmonary embolism: Secondary | ICD-10-CM

## 2015-03-29 DIAGNOSIS — G9340 Encephalopathy, unspecified: Secondary | ICD-10-CM

## 2015-03-29 DIAGNOSIS — R739 Hyperglycemia, unspecified: Secondary | ICD-10-CM

## 2015-03-29 DIAGNOSIS — Z9889 Other specified postprocedural states: Secondary | ICD-10-CM

## 2015-03-29 DIAGNOSIS — Z7901 Long term (current) use of anticoagulants: Secondary | ICD-10-CM

## 2015-03-29 HISTORY — DX: Unspecified diastolic (congestive) heart failure: I50.30

## 2015-03-29 HISTORY — DX: Type 2 diabetes mellitus without complications: E11.9

## 2015-03-29 LAB — COMPREHENSIVE METABOLIC PANEL
ALT: 25 U/L (ref 14–54)
AST: 23 U/L (ref 15–41)
Albumin: 3.8 g/dL (ref 3.5–5.0)
Alkaline Phosphatase: 106 U/L (ref 38–126)
Anion gap: 12 (ref 5–15)
BUN: 17 mg/dL (ref 6–20)
CO2: 24 mmol/L (ref 22–32)
Calcium: 9.7 mg/dL (ref 8.9–10.3)
Chloride: 100 mmol/L — ABNORMAL LOW (ref 101–111)
Creatinine, Ser: 1 mg/dL (ref 0.44–1.00)
GFR calc Af Amer: >60 mL/min (ref >60)
GFR calc non Af Amer: 54 mL/min — ABNORMAL LOW (ref >60)
Glucose, Bld: 397 mg/dL — ABNORMAL HIGH (ref 65–99)
Potassium: 4.3 mmol/L (ref 3.5–5.1)
Sodium: 136 mmol/L (ref 135–145)
Total Bilirubin: 1 mg/dL (ref 0.3–1.2)
Total Protein: 7.4 g/dL (ref 6.5–8.1)

## 2015-03-29 LAB — CBC
HCT: 35.9 % — ABNORMAL LOW (ref 36.0–46.0)
Hemoglobin: 12 g/dL (ref 12.0–15.0)
MCH: 27.8 pg (ref 26.0–34.0)
MCHC: 33.4 g/dL (ref 30.0–36.0)
MCV: 83.3 fL (ref 78.0–100.0)
Platelets: 163 10*3/uL (ref 150–400)
RBC: 4.31 MIL/uL (ref 3.87–5.11)
RDW: 12.7 % (ref 11.5–15.5)
WBC: 15.8 10*3/uL — ABNORMAL HIGH (ref 4.0–10.5)

## 2015-03-29 LAB — URINALYSIS, ROUTINE W REFLEX MICROSCOPIC
Bilirubin Urine: NEGATIVE
Glucose, UA: >1000 mg/dL — AB
Ketones, ur: 15 mg/dL — AB
Nitrite: POSITIVE — AB
Protein, ur: NEGATIVE mg/dL
Specific Gravity, Urine: 1.025 (ref 1.005–1.030)
Urobilinogen, UA: 0.2 mg/dL (ref 0.0–1.0)
pH: 6 (ref 5.0–8.0)

## 2015-03-29 LAB — URINE MICROSCOPIC-ADD ON

## 2015-03-29 LAB — CBG MONITORING, ED: Glucose-Capillary: 374 mg/dL — ABNORMAL HIGH (ref 65–99)

## 2015-03-29 LAB — I-STAT CG4 LACTIC ACID, ED: LACTIC ACID, VENOUS: 1.4 mmol/L (ref 0.5–2.0)

## 2015-03-29 MED ORDER — ACETAMINOPHEN 325 MG PO TABS
650.0000 mg | ORAL_TABLET | Freq: Once | ORAL | Status: AC
  Administered 2015-03-30: 650 mg via ORAL
  Filled 2015-03-29: qty 2

## 2015-03-29 MED ORDER — DEXTROSE 5 % IV SOLN
1.0000 g | Freq: Once | INTRAVENOUS | Status: AC
  Administered 2015-03-30: 1 g via INTRAVENOUS
  Filled 2015-03-29: qty 10

## 2015-03-29 MED ORDER — SODIUM CHLORIDE 0.9 % IV BOLUS (SEPSIS)
2000.0000 mL | Freq: Once | INTRAVENOUS | Status: AC
  Administered 2015-03-29: 2000 mL via INTRAVENOUS
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
## 2015-03-29 NOTE — ED Notes (Signed)
Dr Wilson Singer in room

## 2015-03-30 ENCOUNTER — Other Ambulatory Visit: Payer: Self-pay | Admitting: Family

## 2015-03-30 DIAGNOSIS — Z86718 Personal history of other venous thrombosis and embolism: Secondary | ICD-10-CM

## 2015-03-30 DIAGNOSIS — M8580 Other specified disorders of bone density and structure, unspecified site: Secondary | ICD-10-CM | POA: Diagnosis present

## 2015-03-30 DIAGNOSIS — Z86010 Personal history of colonic polyps: Secondary | ICD-10-CM | POA: Diagnosis not present

## 2015-03-30 DIAGNOSIS — N3281 Overactive bladder: Secondary | ICD-10-CM

## 2015-03-30 DIAGNOSIS — Z7901 Long term (current) use of anticoagulants: Secondary | ICD-10-CM | POA: Diagnosis not present

## 2015-03-30 DIAGNOSIS — G4733 Obstructive sleep apnea (adult) (pediatric): Secondary | ICD-10-CM | POA: Diagnosis present

## 2015-03-30 DIAGNOSIS — Z85820 Personal history of malignant melanoma of skin: Secondary | ICD-10-CM | POA: Diagnosis not present

## 2015-03-30 DIAGNOSIS — M549 Dorsalgia, unspecified: Secondary | ICD-10-CM | POA: Diagnosis present

## 2015-03-30 DIAGNOSIS — Z881 Allergy status to other antibiotic agents status: Secondary | ICD-10-CM | POA: Diagnosis not present

## 2015-03-30 DIAGNOSIS — G9341 Metabolic encephalopathy: Secondary | ICD-10-CM | POA: Diagnosis present

## 2015-03-30 DIAGNOSIS — M1990 Unspecified osteoarthritis, unspecified site: Secondary | ICD-10-CM | POA: Diagnosis present

## 2015-03-30 DIAGNOSIS — I10 Essential (primary) hypertension: Secondary | ICD-10-CM

## 2015-03-30 DIAGNOSIS — G8929 Other chronic pain: Secondary | ICD-10-CM | POA: Diagnosis present

## 2015-03-30 DIAGNOSIS — K219 Gastro-esophageal reflux disease without esophagitis: Secondary | ICD-10-CM | POA: Diagnosis present

## 2015-03-30 DIAGNOSIS — Z86711 Personal history of pulmonary embolism: Secondary | ICD-10-CM | POA: Diagnosis not present

## 2015-03-30 DIAGNOSIS — Z853 Personal history of malignant neoplasm of breast: Secondary | ICD-10-CM | POA: Diagnosis not present

## 2015-03-30 DIAGNOSIS — Z7902 Long term (current) use of antithrombotics/antiplatelets: Secondary | ICD-10-CM | POA: Diagnosis not present

## 2015-03-30 DIAGNOSIS — I2510 Atherosclerotic heart disease of native coronary artery without angina pectoris: Secondary | ICD-10-CM | POA: Diagnosis present

## 2015-03-30 DIAGNOSIS — G9340 Encephalopathy, unspecified: Secondary | ICD-10-CM | POA: Diagnosis not present

## 2015-03-30 DIAGNOSIS — I5032 Chronic diastolic (congestive) heart failure: Secondary | ICD-10-CM | POA: Insufficient documentation

## 2015-03-30 DIAGNOSIS — E118 Type 2 diabetes mellitus with unspecified complications: Secondary | ICD-10-CM | POA: Diagnosis not present

## 2015-03-30 DIAGNOSIS — G629 Polyneuropathy, unspecified: Secondary | ICD-10-CM | POA: Diagnosis present

## 2015-03-30 DIAGNOSIS — E1165 Type 2 diabetes mellitus with hyperglycemia: Secondary | ICD-10-CM | POA: Diagnosis present

## 2015-03-30 DIAGNOSIS — E785 Hyperlipidemia, unspecified: Secondary | ICD-10-CM | POA: Diagnosis present

## 2015-03-30 DIAGNOSIS — N390 Urinary tract infection, site not specified: Secondary | ICD-10-CM

## 2015-03-30 DIAGNOSIS — A419 Sepsis, unspecified organism: Secondary | ICD-10-CM | POA: Diagnosis present

## 2015-03-30 DIAGNOSIS — Z794 Long term (current) use of insulin: Secondary | ICD-10-CM | POA: Diagnosis not present

## 2015-03-30 DIAGNOSIS — Z9049 Acquired absence of other specified parts of digestive tract: Secondary | ICD-10-CM | POA: Diagnosis present

## 2015-03-30 DIAGNOSIS — Z9889 Other specified postprocedural states: Secondary | ICD-10-CM

## 2015-03-30 DIAGNOSIS — I255 Ischemic cardiomyopathy: Secondary | ICD-10-CM | POA: Diagnosis present

## 2015-03-30 DIAGNOSIS — R4182 Altered mental status, unspecified: Secondary | ICD-10-CM | POA: Diagnosis not present

## 2015-03-30 DIAGNOSIS — Z888 Allergy status to other drugs, medicaments and biological substances status: Secondary | ICD-10-CM | POA: Diagnosis not present

## 2015-03-30 DIAGNOSIS — I872 Venous insufficiency (chronic) (peripheral): Secondary | ICD-10-CM | POA: Diagnosis present

## 2015-03-30 LAB — CBC
HEMATOCRIT: 31.8 % — AB (ref 36.0–46.0)
Hemoglobin: 10.5 g/dL — ABNORMAL LOW (ref 12.0–15.0)
MCH: 27.5 pg (ref 26.0–34.0)
MCHC: 33 g/dL (ref 30.0–36.0)
MCV: 83.2 fL (ref 78.0–100.0)
Platelets: 143 10*3/uL — ABNORMAL LOW (ref 150–400)
RBC: 3.82 MIL/uL — ABNORMAL LOW (ref 3.87–5.11)
RDW: 12.9 % (ref 11.5–15.5)
WBC: 15.9 10*3/uL — ABNORMAL HIGH (ref 4.0–10.5)

## 2015-03-30 LAB — COMPREHENSIVE METABOLIC PANEL
ALT: 20 U/L (ref 14–54)
AST: 19 U/L (ref 15–41)
Albumin: 3 g/dL — ABNORMAL LOW (ref 3.5–5.0)
Alkaline Phosphatase: 80 U/L (ref 38–126)
Anion gap: 10 (ref 5–15)
BUN: 19 mg/dL (ref 6–20)
CHLORIDE: 100 mmol/L — AB (ref 101–111)
CO2: 22 mmol/L (ref 22–32)
Calcium: 8.7 mg/dL — ABNORMAL LOW (ref 8.9–10.3)
Creatinine, Ser: 0.96 mg/dL (ref 0.44–1.00)
GFR calc Af Amer: >60 mL/min (ref >60)
GFR, EST NON AFRICAN AMERICAN: 57 mL/min — AB (ref >60)
GLUCOSE: 504 mg/dL — AB (ref 65–99)
Potassium: 4.1 mmol/L (ref 3.5–5.1)
Sodium: 132 mmol/L — ABNORMAL LOW (ref 135–145)
Total Bilirubin: 1.1 mg/dL (ref 0.3–1.2)
Total Protein: 6 g/dL — ABNORMAL LOW (ref 6.5–8.1)

## 2015-03-30 LAB — GLUCOSE, CAPILLARY
GLUCOSE-CAPILLARY: 535 mg/dL — AB (ref 65–99)
Glucose-Capillary: 438 mg/dL — ABNORMAL HIGH (ref 65–99)
Glucose-Capillary: 522 mg/dL — ABNORMAL HIGH (ref 65–99)
Glucose-Capillary: 482 mg/dL — ABNORMAL HIGH (ref 65–99)
Glucose-Capillary: 441 mg/dL — ABNORMAL HIGH (ref 65–99)
Glucose-Capillary: 414 mg/dL — ABNORMAL HIGH (ref 65–99)
Glucose-Capillary: 379 mg/dL — ABNORMAL HIGH (ref 65–99)

## 2015-03-30 LAB — BRAIN NATRIURETIC PEPTIDE: B Natriuretic Peptide: 95.3 pg/mL (ref 0.0–100.0)

## 2015-03-30 LAB — PROTIME-INR
INR: 1.26 (ref 0.00–1.49)
Prothrombin Time: 15.9 seconds — ABNORMAL HIGH (ref 11.6–15.2)

## 2015-03-30 LAB — PROCALCITONIN: PROCALCITONIN: 0.82 ng/mL

## 2015-03-30 LAB — APTT: aPTT: 29 seconds (ref 24–37)

## 2015-03-30 MED ORDER — CEFTRIAXONE SODIUM IN DEXTROSE 20 MG/ML IV SOLN
1.0000 g | INTRAVENOUS | Status: DC
  Filled 2015-03-30: qty 50

## 2015-03-30 MED ORDER — INSULIN ASPART 100 UNIT/ML ~~LOC~~ SOLN: 0.0000 [IU] | Freq: Three times a day (TID) | SUBCUTANEOUS | Status: DC

## 2015-03-30 MED ORDER — CALCIUM CARBONATE-VITAMIN D 500-200 MG-UNIT PO TABS
1.0000 | ORAL_TABLET | Freq: Every day | ORAL | Status: DC
  Administered 2015-03-30 – 2015-04-02 (×4): 1 via ORAL
  Filled 2015-03-30 (×5): qty 1

## 2015-03-30 MED ORDER — INSULIN ASPART 100 UNIT/ML ~~LOC~~ SOLN
7.0000 [IU] | Freq: Once | SUBCUTANEOUS | Status: AC
  Administered 2015-03-30: 7 [IU] via SUBCUTANEOUS

## 2015-03-30 MED ORDER — ONDANSETRON HCL 4 MG/2ML IJ SOLN: 4.0000 mg | Freq: Four times a day (QID) | INTRAMUSCULAR | Status: DC | PRN

## 2015-03-30 MED ORDER — METOPROLOL SUCCINATE ER 25 MG PO TB24
25.0000 mg | ORAL_TABLET | Freq: Every day | ORAL | Status: DC
  Administered 2015-03-30 – 2015-04-02 (×4): 25 mg via ORAL
  Filled 2015-03-30 (×4): qty 1

## 2015-03-30 MED ORDER — ONDANSETRON HCL 4 MG PO TABS: 4.0000 mg | ORAL_TABLET | Freq: Four times a day (QID) | ORAL | Status: DC | PRN

## 2015-03-30 MED ORDER — INSULIN GLARGINE 100 UNIT/ML ~~LOC~~ SOLN
20.0000 [IU] | Freq: Every morning | SUBCUTANEOUS | Status: DC
  Administered 2015-03-31 – 2015-04-02 (×3): 20 [IU] via SUBCUTANEOUS
  Filled 2015-03-30 (×3): qty 0.2

## 2015-03-30 MED ORDER — INSULIN ASPART 100 UNIT/ML ~~LOC~~ SOLN
9.0000 [IU] | Freq: Once | SUBCUTANEOUS | Status: AC
  Administered 2015-03-30: 9 [IU] via SUBCUTANEOUS

## 2015-03-30 MED ORDER — SODIUM CHLORIDE 0.9 % IV SOLN
INTRAVENOUS | Status: DC
  Administered 2015-03-30: 01:00:00 via INTRAVENOUS

## 2015-03-30 MED ORDER — CEFTRIAXONE SODIUM 1 G IJ SOLR
1.0000 g | INTRAMUSCULAR | Status: DC
  Administered 2015-03-30 – 2015-04-01 (×3): 1 g via INTRAVENOUS
  Filled 2015-03-30 (×4): qty 10

## 2015-03-30 MED ORDER — RIVAROXABAN 15 MG PO TABS
15.0000 mg | ORAL_TABLET | Freq: Every day | ORAL | Status: DC
  Filled 2015-03-30: qty 1

## 2015-03-30 MED ORDER — INSULIN ASPART 100 UNIT/ML ~~LOC~~ SOLN
0.0000 [IU] | Freq: Three times a day (TID) | SUBCUTANEOUS | Status: DC
  Administered 2015-03-31: 3 [IU] via SUBCUTANEOUS
  Administered 2015-03-31: 5 [IU] via SUBCUTANEOUS
  Administered 2015-03-31: 7 [IU] via SUBCUTANEOUS
  Administered 2015-04-01 (×2): 2 [IU] via SUBCUTANEOUS
  Administered 2015-04-01: 7 [IU] via SUBCUTANEOUS
  Administered 2015-04-02: 3 [IU] via SUBCUTANEOUS
  Administered 2015-04-02: 1 [IU] via SUBCUTANEOUS
  Administered 2015-04-02: 9 [IU] via SUBCUTANEOUS

## 2015-03-30 MED ORDER — ANASTROZOLE 1 MG PO TABS
1.0000 mg | ORAL_TABLET | Freq: Every day | ORAL | Status: DC
  Administered 2015-03-30 – 2015-04-02 (×4): 1 mg via ORAL
  Filled 2015-03-30 (×4): qty 1

## 2015-03-30 MED ORDER — INSULIN GLARGINE 100 UNIT/ML ~~LOC~~ SOLN
10.0000 [IU] | Freq: Every morning | SUBCUTANEOUS | Status: DC
  Administered 2015-03-30: 10 [IU] via SUBCUTANEOUS
  Filled 2015-03-30: qty 0.1

## 2015-03-30 MED ORDER — INSULIN ASPART 100 UNIT/ML ~~LOC~~ SOLN
4.0000 [IU] | Freq: Three times a day (TID) | SUBCUTANEOUS | Status: DC
  Administered 2015-03-30 – 2015-04-02 (×10): 4 [IU] via SUBCUTANEOUS

## 2015-03-30 MED ORDER — INSULIN GLARGINE 100 UNIT/ML ~~LOC~~ SOLN
20.0000 [IU] | Freq: Every day | SUBCUTANEOUS | Status: DC
  Filled 2015-03-30: qty 0.2

## 2015-03-30 MED ORDER — HYDRALAZINE HCL 20 MG/ML IJ SOLN: 5.0000 mg | INTRAMUSCULAR | Status: DC | PRN

## 2015-03-30 MED ORDER — INSULIN ASPART 100 UNIT/ML ~~LOC~~ SOLN
0.0000 [IU] | SUBCUTANEOUS | Status: DC
  Administered 2015-03-30 (×2): 9 [IU] via SUBCUTANEOUS

## 2015-03-30 MED ORDER — SODIUM CHLORIDE 0.9 % IJ SOLN
3.0000 mL | Freq: Two times a day (BID) | INTRAMUSCULAR | Status: DC
  Administered 2015-03-30 – 2015-04-02 (×7): 3 mL via INTRAVENOUS

## 2015-03-30 MED ORDER — INSULIN ASPART 100 UNIT/ML ~~LOC~~ SOLN
0.0000 [IU] | Freq: Every day | SUBCUTANEOUS | Status: DC
  Administered 2015-03-30: 5 [IU] via SUBCUTANEOUS
  Administered 2015-03-31 – 2015-04-01 (×2): 3 [IU] via SUBCUTANEOUS

## 2015-03-30 MED ORDER — OXYCODONE-ACETAMINOPHEN 5-325 MG PO TABS: 1.0000 | ORAL_TABLET | ORAL | Status: DC | PRN

## 2015-03-30 MED ORDER — GLUCOSAMINE-CHONDROITIN PO TABS: 1.0000 | ORAL_TABLET | Freq: Every day | ORAL | Status: DC

## 2015-03-30 MED ORDER — MIRABEGRON ER 50 MG PO TB24
50.0000 mg | ORAL_TABLET | Freq: Every day | ORAL | Status: DC
  Administered 2015-03-30: 50 mg via ORAL
  Filled 2015-03-30 (×3): qty 1

## 2015-03-30 MED ORDER — ALUM & MAG HYDROXIDE-SIMETH 200-200-20 MG/5ML PO SUSP: 30.0000 mL | Freq: Four times a day (QID) | ORAL | Status: DC | PRN

## 2015-03-30 MED ORDER — NITROGLYCERIN 0.4 MG SL SUBL: 0.4000 mg | SUBLINGUAL_TABLET | SUBLINGUAL | Status: DC | PRN

## 2015-03-30 MED ORDER — RIVAROXABAN 20 MG PO TABS
20.0000 mg | ORAL_TABLET | Freq: Every day | ORAL | Status: DC
  Administered 2015-03-30 – 2015-04-02 (×4): 20 mg via ORAL
  Filled 2015-03-30 (×4): qty 1

## 2015-03-30 MED ORDER — INSULIN GLARGINE 100 UNIT/ML ~~LOC~~ SOLN
30.0000 [IU] | Freq: Every day | SUBCUTANEOUS | Status: DC
  Administered 2015-03-30: 30 [IU] via SUBCUTANEOUS
  Filled 2015-03-30 (×2): qty 0.3

## 2015-03-30 MED ORDER — CLOPIDOGREL BISULFATE 75 MG PO TABS
75.0000 mg | ORAL_TABLET | Freq: Every day | ORAL | Status: DC
  Administered 2015-03-30 – 2015-04-02 (×4): 75 mg via ORAL
  Filled 2015-03-30 (×4): qty 1

## 2015-03-30 MED ORDER — PRAVASTATIN SODIUM 40 MG PO TABS
40.0000 mg | ORAL_TABLET | Freq: Every evening | ORAL | Status: DC
  Administered 2015-03-30 – 2015-04-02 (×4): 40 mg via ORAL
  Filled 2015-03-30 (×4): qty 1
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
## 2015-03-30 NOTE — Evaluation (Signed)
Physical Therapy Evaluation Patient Details Name: [name] MRN: [id number] DOB: 05-Dec-1940 Today's Date: 03/30/2015   History of Present Illness  74 y/o female admitted with chills and AMS and found to have UTI.  Clinical Impression  Pt admitted with above diagnosis. Pt currently with functional limitations due to the deficits listed below (see PT Problem List). Pt will benefit from skilled PT to increase their independence and safety with mobility to allow discharge to the venue listed below.  Pt moving well with RW, but would benefit from HHPT to work on gait and balance on unlevel surfaces as pt likes to walk her dog.  Pt was unable to don her socks and PT had to put on.     Follow Up Recommendations Home health PT    Equipment Recommendations  None recommended by PT    Recommendations for Other Services       Precautions / Restrictions Precautions Precautions: Fall      Mobility  Bed Mobility Overal bed mobility: Needs Assistance Bed Mobility: Supine to Sit     Supine to sit: Supervision        Transfers Overall transfer level: Needs assistance Equipment used: Rolling walker (2 wheeled) Transfers: Sit to/from Stand Sit to Stand: Supervision            Ambulation/Gait Ambulation/Gait assistance: Min guard;Supervision Ambulation Distance (Feet): 300 Feet Assistive device: Rolling walker (2 wheeled) Gait Pattern/deviations: Step-through pattern     General Gait Details: Steady with RW, but some difficulty with turning and pt reports she likes her rollator better  Stairs            Wheelchair Mobility    Modified Rankin (Stroke Patients Only)       Balance Overall balance assessment: Needs assistance   Sitting balance-Leahy Scale: Good     Standing balance support: Bilateral upper extremity supported;During functional activity Standing balance-Leahy Scale: Fair                               Pertinent Vitals/Pain  Pain Assessment: No/denies pain    Home Living Family/patient expects to be discharged to:: Private residence Living Arrangements: Children Available Help at Discharge: Family;Available PRN/intermittently Type of Home: House Home Access: Level entry     Home Layout: Two level Home Equipment: Walker - 4 wheels;Bedside commode Additional Comments: Lives in basement with bedroom and no bathroom.  Uses [facility].    Prior Function Level of Independence: Independent with assistive device(s)         Comments: Amb with rollator at times in the house, but not outside.  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    Exercises        Assessment/Plan    PT Assessment    PT Diagnosis Difficulty walking   PT  Problem List    PT Treatment Interventions     PT Goals (Current goals can be found in the Care Plan section) Acute Rehab PT Goals Patient Stated Goal: Get back home to walk her dog PT Goal Formulation: With patient Time For Goal Achievement: 04/13/15 Potential to Achieve Goals: Good    Frequency     Barriers to discharge        Co-evaluation               End of Session Equipment Utilized During Treatment: Gait belt Activity Tolerance: Patient tolerated treatment well Patient left: in chair;with call bell/phone within reach;with chair alarm set;with nursing/sitter in room Nurse Communication: Mobility  status         Time: 1101-1134 PT Time Calculation (min) (ACUTE ONLY): 33 min   Charges:   PT Evaluation $Initial PT Evaluation Tier I: 1 Procedure PT Treatments $Gait Training: 8-22 mins   PT G Codes:        Saranya Harlin LUBECK 03/30/2015, 12:22 PM

## 2015-03-30 NOTE — Progress Notes (Signed)
ANTIBIOTIC CONSULT NOTE - INITIAL  Pharmacy Consult for Ceftriaxone Indication: UTI  Allergies  Allergen Reactions  . Metformin Diarrhea  . Pioglitazone Other (See Comments)    weight gain  . Vancomycin Rash    Erythematous rash, hands and toes became cyanotic.      Vital Signs: Temp: 99.6 F (37.6 C) (05/13 0201) Temp Source: Oral (05/13 0201) BP: 128/49 mmHg (05/13 0201) Pulse Rate: 81 (05/13 0201)  Labs:  Recent Labs  03/29/15 2230  WBC 15.8*  HGB 12.0  PLT 163  CREATININE 1.00   Estimated Creatinine Clearance: 57 mL/min (by C-G formula based on Cr of 1).  Medical History: Past Medical History  Diagnosis Date  . Hyperplastic colon polyp   . Diabetes mellitus type II   . Osteoarthritis   . GERD (gastroesophageal reflux disease)   . Osteopenia   . Lupus     of skin see dermatologist frequently  . OA (osteoarthritis of spine)     C-spine  . DVT (deep venous thrombosis)     a. right leg DVT 05-17-2008. b. h/o leg swelling of left leg 2-10, u/s showed a old clot. c. another DVT reported 07/2011.  . 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 Allergy   . Clotting disorder     hx of PE and DVT  . C. difficile diarrhea     hx of,   . Skin cancer     skin CA  . PONV (postoperative nausea and vomiting)   . Hypertension   . Breast cancer     left ductal breast ca dx 2003 approx, s/p XRt x 6 weeks, released from oncology (per  patient); dx right breast ca 2014 (in remission)  . Refusal of blood transfusions as patient is Jehovah's Witness   . CAD (coronary artery disease)     a. NSTEMI 07/2014: s/p BMS to mid-distal LAD.  . Ischemic cardiomyopathy     a. cath 07/22/2014 EF 35-40%; Echo 07/22/2013 EF 79-48%, grade 1 diastolic dysfunction, large area of apical akinesis.  Marland Kitchen HTN (hypertension)   . Dyslipidemia   . Transaminitis     a. Mild during 07/2014 admission ? due to MI  . Diabetes mellitus without complication   . Diastolic  congestive heart failure     Assessment: 74 y/o F here with chills/shaking, dysuria, altered mental status. WBC elevated. Pt is febrile. U/A with + nitrites.   Plan:  -Ceftriaxone 1g IV q24h -Trend WBC, temp, urine cultures  Narda Bonds 03/30/2015,2:10 AM

## 2015-03-30 NOTE — Evaluation (Signed)
Occupational Therapy Evaluation Patient Details Name: [name] MRN: [id number] DOB: 10-Nov-1941 Today's Date: 03/30/2015    History of Present Illness 74 y/o female admitted with chills and AMS and found to have UTI.   Clinical Impression   PTA pt lived at home and was independent with ADLs with use of rollator. Pt currently at Supervision level and close to Mod I. Educated pt on fall prevention and energy conservation. No further acute OT needs.     Follow Up Recommendations  No OT follow up    Equipment Recommendations  None recommended by OT    Recommendations for Other Services       Precautions / Restrictions Precautions Precautions: Fall Restrictions Weight Bearing Restrictions: No      Mobility Bed Mobility Overal bed mobility: Modified Independent                Transfers Overall transfer level: Needs assistance Equipment used: Rolling walker (2 wheeled) Transfers: Sit to/from Stand Sit to Stand: Supervision         General transfer comment: Supervision for safety.          ADL Overall ADL's : Needs assistance/impaired                                       General ADL Comments: Pt at Supervision level for functional mobility and ADLs and is likely close to baseline. Educated pt on fall prevention and energy conservation.      Vision Additional Comments: No change from baseline.    Perception Perception Perception Tested?: No   Praxis Praxis Praxis tested?: Not tested    Pertinent Vitals/Pain Pain Assessment: No/denies pain     Hand Dominance Right   Extremity/Trunk Assessment Upper Extremity Assessment Upper Extremity Assessment: Overall WFL for tasks assessed   Lower Extremity Assessment Lower Extremity Assessment: Defer to PT evaluation   Cervical / Trunk Assessment Cervical / Trunk Assessment: Normal   Communication Communication Communication: No difficulties   Cognition Arousal/Alertness:  Awake/alert Behavior During Therapy: WFL for tasks assessed/performed Overall Cognitive Status: Within Functional Limits for tasks assessed                                Home Living Family/patient expects to be discharged to:: Private residence Living Arrangements: Children Available Help at Discharge: Family;Available PRN/intermittently Type of Home: House Home Access: Level entry     Home Layout: Two level Alternate Level Stairs-Number of Steps: 5  Alternate Level Stairs-Rails: Right     Bathroom Toilet: Standard     Home Equipment: Walker - 4 wheels;Bedside commode   Additional Comments: Pt lives in garage apartment and uses [facility]. She sponge bathes. Uses bathroom in main living area if needed.       Prior Functioning/Environment Level of Independence: Independent with assistive device(s)        Comments: Amb with rollator at times in the house, but not outside.  Walks her dog "little Bear"    OT Diagnosis: Generalized weakness;Acute pain    End of Session Equipment Utilized During Treatment: Rolling walker  Activity Tolerance: Patient tolerated treatment well Patient left: in bed;with call bell/phone within reach;with bed alarm set   Time: 7001-7494 OT Time Calculation (min): 17 min Charges:  OT General Charges $OT Visit: 1 Procedure OT Evaluation $Initial OT Evaluation Tier I: 1  Procedure G-Codes:    Juluis Rainier 04-26-15, 5:55 PM  Cyndie Chime, OTR/L Occupational Therapist [phone] (pager)

## 2015-03-30 NOTE — Progress Notes (Signed)
PHARMACIST - PHYSICIAN ORDER COMMUNICATION  CONCERNING: P&T Medication Policy on Herbal Medications  DESCRIPTION:  This patient's order for:  Glucosamine-Chondroitin  has been noted.  This product(s) is classified as an "herbal" or natural product. Due to a lack of definitive safety studies or FDA approval, nonstandard manufacturing practices, plus the potential risk of unknown drug-drug interactions while on inpatient medications, the Pharmacy and Therapeutics Committee does not permit the use of "herbal" or natural products of this type within [facility].   ACTION TAKEN: The pharmacy department is unable to verify this order at this time and your patient has been informed of this safety policy. Please reevaluate patient's clinical condition at discharge and address if the herbal or natural product(s) should be resumed at that time.   

## 2015-03-30 NOTE — Progress Notes (Signed)
Noon CBG of 522 reported to Dr Erlinda Hong. Advised of interventions by off going RN. Plan to recheck CBG at 1400 and report results to Dr. Erlinda Hong

## 2015-03-30 NOTE — Progress Notes (Signed)
Utilization Review completed. Maryanna Stuber RN BSN CM 

## 2015-03-31 LAB — BASIC METABOLIC PANEL
Anion gap: 9 (ref 5–15)
BUN: 15 mg/dL (ref 6–20)
CALCIUM: 8.8 mg/dL — AB (ref 8.9–10.3)
CHLORIDE: 100 mmol/L — AB (ref 101–111)
CO2: 24 mmol/L (ref 22–32)
CREATININE: 0.81 mg/dL (ref 0.44–1.00)
GFR calc Af Amer: >60 mL/min (ref >60)
GFR calc non Af Amer: >60 mL/min (ref >60)
Glucose, Bld: 306 mg/dL — ABNORMAL HIGH (ref 65–99)
POTASSIUM: 3.7 mmol/L (ref 3.5–5.1)
Sodium: 133 mmol/L — ABNORMAL LOW (ref 135–145)

## 2015-03-31 LAB — GLUCOSE, CAPILLARY
GLUCOSE-CAPILLARY: 228 mg/dL — AB (ref 65–99)
GLUCOSE-CAPILLARY: 277 mg/dL — AB (ref 65–99)
Glucose-Capillary: 325 mg/dL — ABNORMAL HIGH (ref 65–99)
Glucose-Capillary: 299 mg/dL — ABNORMAL HIGH (ref 65–99)

## 2015-03-31 LAB — CBC
HCT: 32.8 % — ABNORMAL LOW (ref 36.0–46.0)
Hemoglobin: 10.9 g/dL — ABNORMAL LOW (ref 12.0–15.0)
MCH: 27.7 pg (ref 26.0–34.0)
MCHC: 33.2 g/dL (ref 30.0–36.0)
MCV: 83.5 fL (ref 78.0–100.0)
Platelets: 159 10*3/uL (ref 150–400)
RBC: 3.93 MIL/uL (ref 3.87–5.11)
RDW: 13 % (ref 11.5–15.5)
WBC: 8.4 10*3/uL (ref 4.0–10.5)

## 2015-03-31 LAB — HEMOGLOBIN A1C
Hgb A1c MFr Bld: 11.3 % — ABNORMAL HIGH (ref 4.8–5.6)
Mean Plasma Glucose: 278 mg/dL

## 2015-03-31 MED ORDER — INSULIN GLARGINE 100 UNIT/ML ~~LOC~~ SOLN
40.0000 [IU] | Freq: Every day | SUBCUTANEOUS | Status: DC
  Administered 2015-03-31 – 2015-04-01 (×2): 40 [IU] via SUBCUTANEOUS
  Filled 2015-03-31 (×3): qty 0.4

## 2015-03-31 NOTE — Discharge Instructions (Signed)

## 2015-03-31 NOTE — Progress Notes (Signed)
PROGRESS NOTE  [name] HQP:[id number] DOB: 1941-02-14 DOA: 03/29/2015 PCP: Kelsey Pear., NP  HPI/Recap of past 24 hours:  Feeling better, AAOX3, improved memory, denies pain, no sob. Reported generalized weakness.  Assessment/Plan: Principal Problem:   UTI (lower urinary tract infection) Active Problems:   Diabetes mellitus type 2 with complications, uncontrolled   Hyperlipemia   Essential hypertension   Overactive bladder   Chronic anticoagulation   S/P lumpectomy, right breast   Venous (peripheral) insufficiency   Low back pain   OSA (obstructive sleep apnea)   CAD (coronary artery disease)   Personal history of PE (pulmonary embolism)   Personal history of DVT (deep vein thrombosis)   Sepsis   Acute encephalopathy  UTI/spesis/metabolic encephalopathy: improving, AAOX3 today, urine culture +g-rods. blood culture no growth, continue rocephin.   Uncontrolled insulin dependent DM2, a1c 11.3, increase nighttime insulin to 40unit qhs, increase am lantus to 20unit . Continue meal coverage and SSI. continue adjust insulin due to persistent very elevated blood glucose, no gap. Diabetic education.  H/o Diastolic CHF, ivf stopped with improvement in sepsis. Continue hold diuretics.  H/o DVT, continue xarelto  HTN/CAD; stable, hold norvasc, continue toprol xl/plavix/statin  H/o breast cancer s/p surgery and XRT, unknown detail, continue arimidex, need close outpatient oncology follow up.    Code Status: full  Family Communication: patient  Disposition Plan: remain in the hospital,    Consultants:  none  Procedures:  none  Antibiotics:  rocephin   Objective: BP 110/49 mmHg  Pulse 64  Temp(Src) 98.2 F (36.8 C) (Oral)  Resp 20  Ht 5\' 6"  (1.676 m)  Wt 90.8 kg (200 lb 2.8 oz)  BMI 32.32 kg/m2  SpO2 95%  Intake/Output Summary (Last 24 hours) at 03/31/15 1143 Last data filed at 03/31/15 0519  Gross per 24 hour  Intake    240 ml  Output     900 ml  Net   -660 ml   Filed Weights   03/31/15 0517  Weight: 90.8 kg (200 lb 2.8 oz)    Exam:   General:  NAD  Cardiovascular: RRR  Respiratory: CTABL  Abdomen: Soft/ND/NT, positive BS  Musculoskeletal: No Edema  Neuro: aaox3, improved memory  Data Reviewed: Basic Metabolic Panel:  Recent Labs Lab 03/29/15 2230 03/30/15 0344 03/31/15 0506  NA 136 132* 133*  K 4.3 4.1 3.7  CL 100* 100* 100*  CO2 24 22 24   GLUCOSE 397* 504* 306*  BUN 17 19 15   CREATININE 1.00 0.96 0.81  CALCIUM 9.7 8.7* 8.8*   Liver Function Tests:  Recent Labs Lab 03/29/15 2230 03/30/15 0344  AST 23 19  ALT 25 20  ALKPHOS 106 80  BILITOT 1.0 1.1  PROT 7.4 6.0*  ALBUMIN 3.8 3.0*   No results for input(s): LIPASE, AMYLASE in the last 168 hours. No results for input(s): AMMONIA in the last 168 hours. CBC:  Recent Labs Lab 03/29/15 2230 03/30/15 0344 03/31/15 0506  WBC 15.8* 15.9* 8.4  HGB 12.0 10.5* 10.9*  HCT 35.9* 31.8* 32.8*  MCV 83.3 83.2 83.5  PLT 163 143* 159   Cardiac Enzymes:   No results for input(s): CKTOTAL, CKMB, CKMBINDEX, TROPONINI in the last 168 hours. BNP (last 3 results)  Recent Labs  03/30/15 0344  BNP 95.3    ProBNP (last 3 results)  Recent Labs  07/21/14 1830 07/22/14 0244  PROBNP 35.7 355.3*    CBG:  Recent Labs Lab 03/30/15 1350 03/30/15 1640 03/30/15 2003 03/30/15 2143 03/31/15 0818  GLUCAP [name]    Recent Results (from the past 240 hour(s))  Blood culture (routine x 2)     Status: None (Preliminary result)   Collection Time: 03/29/15 10:28 PM  Result Value Ref Range Status   Specimen Description BLOOD RIGHT ARM  Final   Special Requests BOTTLES DRAWN AEROBIC AND ANAEROBIC 4CC  Final   Culture   Final           BLOOD CULTURE RECEIVED NO GROWTH TO DATE CULTURE WILL BE HELD FOR 5 DAYS BEFORE ISSUING A FINAL NEGATIVE REPORT Performed at [facility]    Report Status PENDING  Incomplete  Urine  culture     Status: None (Preliminary result)   Collection Time: 03/29/15 10:55 PM  Result Value Ref Range Status   Specimen Description URINE, RANDOM  Final   Special Requests ADD  Final   Colony Count   Final    >=100,000 COLONIES/ML Performed at [facility]    Culture   Final    [name]    Report Status PENDING  Incomplete  Blood culture (routine x 2)     Status: None (Preliminary result)   Collection Time: 03/29/15 11:30 PM  Result Value Ref Range Status   Specimen Description BLOOD RIGHT HAND  Final   Special Requests BOTTLES DRAWN AEROBIC AND ANAEROBIC 5CC  Final   Culture   Final           BLOOD CULTURE RECEIVED NO GROWTH TO DATE CULTURE WILL BE HELD FOR 5 DAYS BEFORE ISSUING A FINAL NEGATIVE REPORT Performed at [facility]    Report Status PENDING  Incomplete     Studies: No results found.  Scheduled Meds: . anastrozole  1 mg Oral Daily  . calcium-vitamin D  1 tablet Oral Q breakfast  . cefTRIAXone (ROCEPHIN) 1 g IVPB  1 g Intravenous Q24H  . clopidogrel  75 mg Oral Daily  . insulin aspart  0-5 Units Subcutaneous QHS  . insulin aspart  0-9 Units Subcutaneous TID WC  . insulin aspart  4 Units Subcutaneous TID WC  . insulin glargine  20 Units Subcutaneous q morning - 10a  . insulin glargine  40 Units Subcutaneous QHS  . metoprolol succinate  25 mg Oral Daily  . mirabegron ER  50 mg Oral Daily  . pravastatin  40 mg Oral QPM  . Rivaroxaban  20 mg Oral Q supper  . sodium chloride  3 mL Intravenous Q12H    Continuous Infusions:    Time spent: 59mins  Kelsey Blakeley MD, PhD  [facility] Pager [phone]. If 7PM-7AM, please contact night-coverage at [email or web address], password [facility] 03/31/2015, 11:43 AM  LOS: 1 day

## 2015-03-31 NOTE — Evaluation (Signed)
Clinical/Bedside Swallow Evaluation Patient Details  Name: [name] MRN: [id number] Date of Birth: 06-23-1941  Today's Date: 03/31/2015 Time: SLP Start Time (ACUTE ONLY): 3557 SLP Stop Time (ACUTE ONLY): 0849 SLP Time Calculation (min) (ACUTE ONLY): 16 min  Past Medical History:  Past Medical History  Diagnosis Date  . Hyperplastic colon polyp   . Diabetes mellitus type II   . Osteoarthritis   . GERD (gastroesophageal reflux disease)   . Osteopenia   . Lupus     of skin see dermatologist frequently  . OA (osteoarthritis of spine)     C-spine  . DVT (deep venous thrombosis)     a. right leg DVT 05-17-2008. b. h/o leg swelling of left leg 2-10, u/s showed a old clot. c. another DVT reported 07/2011.  . 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 Allergy   . Clotting disorder     hx of PE and DVT  . C. difficile diarrhea     hx of,   . Skin cancer     skin CA  . PONV (postoperative nausea and vomiting)   . Hypertension   . Breast cancer     left ductal breast ca dx 2003 approx, s/p XRt x 6 weeks, released from oncology (per  patient); dx right breast ca 2014 (in remission)  . Refusal of blood transfusions as patient is Jehovah's Witness   . CAD (coronary artery disease)     a. NSTEMI 07/2014: s/p BMS to mid-distal LAD.  . Ischemic cardiomyopathy     a. cath 07/22/2014 EF 35-40%; Echo 07/22/2013 EF 32-20%, grade 1 diastolic dysfunction, large area of apical akinesis.  Marland Kitchen HTN (hypertension)   . Dyslipidemia   . Transaminitis     a. Mild during 07/2014 admission ? due to MI  . Diabetes mellitus without complication   . Diastolic congestive heart failure    Past Surgical History:  Past Surgical History  Procedure Laterality Date  . Cholecystectomy    . Breast biopsy      left breast and right breast  . Breast lumpectomy      left breast-ductal carcinoma in situ  . Appendectomy    . Skin cancer excision      from back   . Breast  lumpectomy with needle localization and axillary sentinel lymph node bx Right 06/17/2013    Procedure: BREAST LUMPECTOMY WITH NEEDLE LOCALIZATION AND AXILLARY SENTINEL LYMPH NODE BX;  Surgeon: Merrie Roof, MD;  Location: [facility];  Service: General;  Laterality: Right;  . Irrigation and debridement abscess Right 02/18/2014    Procedure: IRRIGATION AND DEBRIDEMENT RIGHT AXILLARY ABSCESS;  Surgeon: Leighton Ruff, MD;  Location: [facility];  Service: General;  Laterality: Right;  . Left heart cath N/A 07/22/2014    Procedure: LEFT HEART CATH;  Surgeon: Lorretta Harp, MD;  Location: [facility] CATH LAB;  Service: Cardiovascular;  Laterality: N/A;   HPI:  74 y/o female admitted with chills and AMS and found to have UTI.   Assessment / Plan / Recommendation Clinical Impression  Pt's oropharyngeal swallow appears WFL without overt signs of aspiration or dysphagia. She reports having difficulty swallowing pills at baseline, for which SLP recommended trial of meds whole in puree. Otherwise, recommend a regular diet and thin liquids without further acute SLP needs identified.    Aspiration Risk  Mild    Diet Recommendation Age appropriate regular solids;Thin   Medication Administration: Whole meds with  puree Compensations: Slow rate;Small sips/bites    Other  Recommendations Oral Care Recommendations: Oral care BID   Pertinent Vitals/Pain n/a    SLP Swallow Goals     Swallow Study Prior Functional Status       General Other Pertinent Information: 74 y/o female admitted with chills and AMS and found to have UTI. Type of Study: Bedside swallow evaluation Previous Swallow Assessment: none in chart Diet Prior to this Study: Dysphagia 3 (soft);Nectar-thick liquids Temperature Spikes Noted: No Respiratory Status: Room air History of Recent Intubation: No Behavior/Cognition: Alert;Cooperative;Pleasant mood Oral Cavity - Dentition: Adequate natural dentition/normal for age Self-Feeding Abilities: Able  to feed self Patient Positioning: Upright in bed Baseline Vocal Quality: Normal    Oral/Motor/Sensory Function Overall Oral Motor/Sensory Function: Appears within functional limits for tasks assessed   Ice Chips Ice chips: Not tested   Thin Liquid Thin Liquid: Within functional limits Presentation: Cup;Self Fed;Straw    Nectar Thick Nectar Thick Liquid: Not tested   Honey Thick Honey Thick Liquid: Not tested   Puree Puree: Not tested   Solid   Solid: Within functional limits Presentation: Self Fed      Germain Osgood, M.A. CCC-SLP [phone]  Germain Osgood 03/31/2015,8:59 AM

## 2015-03-31 NOTE — Progress Notes (Signed)
Patient is refusing CPAP for tonight. States that she does not like the mask and she is working on getting the nasal pillows to wear at home. RT made pt aware that if she changed her mind to call.

## 2015-03-31 NOTE — Progress Notes (Signed)
RT Note:  Patient refused CPAP.  States she does not wear CPAP at home.

## 2015-04-01 LAB — BASIC METABOLIC PANEL
ANION GAP: 9 (ref 5–15)
BUN: 12 mg/dL (ref 6–20)
CALCIUM: 9.1 mg/dL (ref 8.9–10.3)
CHLORIDE: 105 mmol/L (ref 101–111)
CO2: 25 mmol/L (ref 22–32)
Creatinine, Ser: 0.68 mg/dL (ref 0.44–1.00)
GFR calc non Af Amer: >60 mL/min (ref >60)
Glucose, Bld: 165 mg/dL — ABNORMAL HIGH (ref 65–99)
POTASSIUM: 3.6 mmol/L (ref 3.5–5.1)
Sodium: 139 mmol/L (ref 135–145)

## 2015-04-01 LAB — URINE CULTURE: Colony Count: >=100000

## 2015-04-01 LAB — CBC
HCT: 35.1 % — ABNORMAL LOW (ref 36.0–46.0)
HEMOGLOBIN: 11.3 g/dL — AB (ref 12.0–15.0)
MCH: 27.2 pg (ref 26.0–34.0)
MCHC: 32.2 g/dL (ref 30.0–36.0)
MCV: 84.4 fL (ref 78.0–100.0)
PLATELETS: 169 10*3/uL (ref 150–400)
RBC: 4.16 MIL/uL (ref 3.87–5.11)
RDW: 13.1 % (ref 11.5–15.5)
WBC: 5.8 10*3/uL (ref 4.0–10.5)

## 2015-04-01 LAB — GLUCOSE, CAPILLARY
GLUCOSE-CAPILLARY: 156 mg/dL — AB (ref 65–99)
GLUCOSE-CAPILLARY: 327 mg/dL — AB (ref 65–99)
GLUCOSE-CAPILLARY: 292 mg/dL — AB (ref 65–99)
Glucose-Capillary: 192 mg/dL — ABNORMAL HIGH (ref 65–99)

## 2015-04-01 LAB — MAGNESIUM: Magnesium: 1.8 mg/dL (ref 1.7–2.4)

## 2015-04-01 LAB — TSH: TSH: 1.874 u[IU]/mL (ref 0.350–4.500)

## 2015-04-01 MED ORDER — CYCLOBENZAPRINE HCL 10 MG PO TABS
10.0000 mg | ORAL_TABLET | Freq: Once | ORAL | Status: AC
  Administered 2015-04-01: 10 mg via ORAL
  Filled 2015-04-01: qty 1

## 2015-04-01 MED ORDER — ENOXAPARIN SODIUM 100 MG/ML ~~LOC~~ SOLN: 1.0000 mg/kg | SUBCUTANEOUS | Status: DC

## 2015-04-01 MED ORDER — LISINOPRIL 2.5 MG PO TABS
2.5000 mg | ORAL_TABLET | Freq: Every day | ORAL | Status: DC
  Administered 2015-04-01: 2.5 mg via ORAL
  Filled 2015-04-01 (×2): qty 1

## 2015-04-01 MED ORDER — HYDROCODONE-ACETAMINOPHEN 5-325 MG PO TABS
1.0000 | ORAL_TABLET | Freq: Once | ORAL | Status: AC
  Administered 2015-04-01: 1 via ORAL
  Filled 2015-04-01: qty 1

## 2015-04-01 NOTE — Progress Notes (Signed)
PROGRESS NOTE  [name] WLS:[id number] DOB: 08/09/1941 DOA: 03/29/2015 PCP: Nance Pear., NP  HPI/Recap of past 24 hours:  AAOX3, immediate recall 2/3, Reported generalized weakness. Feeling dizzy when standing up. Reported urinary frequent for the last several months wearing dipper and seeing urology for this, reported not taking myrbetriq due to not able to affort. Reported feeling depressed and stressed due to family situations.  Assessment/Plan: Principal Problem:   UTI (lower urinary tract infection) Active Problems:   Diabetes mellitus type 2 with complications, uncontrolled   Hyperlipemia   Essential hypertension   Overactive bladder   Chronic anticoagulation   S/P lumpectomy, right breast   Venous (peripheral) insufficiency   Low back pain   OSA (obstructive sleep apnea)   CAD (coronary artery disease)   Personal history of PE (pulmonary embolism)   Personal history of DVT (deep vein thrombosis)   Sepsis   Acute encephalopathy  UTI/spesis/metabolic encephalopathy: improving, AAOX3 , urine culture +g-rods. blood culture no growth, continue rocephin.   Uncontrolled insulin dependent DM2, a1c 11.3, increase nighttime insulin to 40unit qhs, increase am lantus to 20unit . Continue meal coverage and SSI. continue adjust insulin due to persistent very elevated blood glucose, no gap. Diabetic education.  Diastolic CHF, ivf stopped with improvement in sepsis. Euvolemic, Continue hold diuretics.  H/o bilateral DVT/bilateral PE, continue xarelto  HTN/CAD s/p BMS 2015; stable, denies chest pain.  continue toprol xl/plavix/statin. D/c norvasc, start low dose lisinopril.    H/o breast cancer s/p surgery and XRT, unknown detail, continue arimidex, need close outpatient oncology follow up.  Urinary frequency for the last several months, likely due to uncontrolled diabetes. Patient declined mirabetriq. Reported less urinary frequency here.  Weakness/dzziness upon  standing: check orthostatic, PT eval.  Code Status: full  Family Communication: patient  Disposition Plan: improving,awiting urine culture result, likely e/d 5/16   Consultants:  none  Procedures:  none  Antibiotics:  rocephin   Objective: BP 140/52 mmHg  Pulse 60  Temp(Src) 98 F (36.7 C) (Oral)  Resp 18  Ht 5\' 6"  (1.676 m)  Wt 89.8 kg (197 lb 15.6 oz)  BMI 31.97 kg/m2  SpO2 98%  Intake/Output Summary (Last 24 hours) at 04/01/15 1125 Last data filed at 04/01/15 0915  Gross per 24 hour  Intake    480 ml  Output   1150 ml  Net   -670 ml   Filed Weights   03/31/15 0517 04/01/15 0501  Weight: 90.8 kg (200 lb 2.8 oz) 89.8 kg (197 lb 15.6 oz)    Exam:   General:  NAD  Cardiovascular: RRR  Respiratory: CTABL  Abdomen: Soft/ND/NT, positive BS  Musculoskeletal: No Edema, chronic venous stasis changes lower extremities.  Neuro: aaox3, improved memory  Data Reviewed: Basic Metabolic Panel:  Recent Labs Lab 03/29/15 2230 03/30/15 0344 03/31/15 0506 04/01/15 0633  NA 136 132* 133* 139  K 4.3 4.1 3.7 3.6  CL 100* 100* 100* 105  CO2 24 22 24 25   GLUCOSE 397* 504* 306* 165*  BUN 17 19 15 12   CREATININE 1.00 0.96 0.81 0.68  CALCIUM 9.7 8.7* 8.8* 9.1  MG  --   --   --  1.8   Liver Function Tests:  Recent Labs Lab 03/29/15 2230 03/30/15 0344  AST 23 19  ALT 25 20  ALKPHOS 106 80  BILITOT 1.0 1.1  PROT 7.4 6.0*  ALBUMIN 3.8 3.0*   No results for input(s): LIPASE, AMYLASE in the last 168 hours. No results  for input(s): AMMONIA in the last 168 hours. CBC:  Recent Labs Lab 03/29/15 2230 03/30/15 0344 03/31/15 0506 04/01/15 0633  WBC 15.8* 15.9* 8.4 5.8  HGB 12.0 10.5* 10.9* 11.3*  HCT 35.9* 31.8* 32.8* 35.1*  MCV 83.3 83.2 83.5 84.4  PLT 163 143* 159 169   Cardiac Enzymes:   No results for input(s): CKTOTAL, CKMB, CKMBINDEX, TROPONINI in the last 168 hours. BNP (last 3 results)  Recent Labs  03/30/15 0344  BNP 95.3     ProBNP (last 3 results)  Recent Labs  07/21/14 1830 07/22/14 0244  PROBNP 35.7 355.3*    CBG:  Recent Labs Lab 03/31/15 0818 03/31/15 1237 03/31/15 1649 03/31/15 2137 04/01/15 0818  GLUCAP 325* 299* 228* 277* 156*    Recent Results (from the past 240 hour(s))  Blood culture (routine x 2)     Status: None (Preliminary result)   Collection Time: 03/29/15 10:28 PM  Result Value Ref Range Status   Specimen Description BLOOD RIGHT ARM  Final   Special Requests BOTTLES DRAWN AEROBIC AND ANAEROBIC 4CC  Final   Culture   Final           BLOOD CULTURE RECEIVED NO GROWTH TO DATE CULTURE WILL BE HELD FOR 5 DAYS BEFORE ISSUING A FINAL NEGATIVE REPORT Performed at [facility]    Report Status PENDING  Incomplete  Urine culture     Status: None (Preliminary result)   Collection Time: 03/29/15 10:55 PM  Result Value Ref Range Status   Specimen Description URINE, RANDOM  Final   Special Requests ADD  Final   Colony Count   Final    >=100,000 COLONIES/ML Performed at [facility]    Culture   Final    [place] Performed at [facility]    Report Status PENDING  Incomplete  Blood culture (routine x 2)     Status: None (Preliminary result)   Collection Time: 03/29/15 11:30 PM  Result Value Ref Range Status   Specimen Description BLOOD RIGHT HAND  Final   Special Requests BOTTLES DRAWN AEROBIC AND ANAEROBIC 5CC  Final   Culture   Final           BLOOD CULTURE RECEIVED NO GROWTH TO DATE CULTURE WILL BE HELD FOR 5 DAYS BEFORE ISSUING A FINAL NEGATIVE REPORT Performed at [facility]    Report Status PENDING  Incomplete     Studies: No results found.  Scheduled Meds: . anastrozole  1 mg Oral Daily  . calcium-vitamin D  1 tablet Oral Q breakfast  . cefTRIAXone (ROCEPHIN) 1 g IVPB  1 g Intravenous Q24H  . clopidogrel  75 mg Oral Daily  . insulin aspart  0-5 Units Subcutaneous QHS  . insulin aspart  0-9 Units Subcutaneous TID  WC  . insulin aspart  4 Units Subcutaneous TID WC  . insulin glargine  20 Units Subcutaneous q morning - 10a  . insulin glargine  40 Units Subcutaneous QHS  . metoprolol succinate  25 mg Oral Daily  . mirabegron ER  50 mg Oral Daily  . pravastatin  40 mg Oral QPM  . Rivaroxaban  20 mg Oral Q supper  . sodium chloride  3 mL Intravenous Q12H    Continuous Infusions:    Time spent: >61mins  Reya Aurich MD, PhD  [facility] Pager [phone]. If 7PM-7AM, please contact night-coverage at [email or web address], password [facility] 04/01/2015, 11:25 AM  LOS: 2 days

## 2015-04-02 LAB — GLUCOSE, CAPILLARY
GLUCOSE-CAPILLARY: 241 mg/dL — AB (ref 65–99)
Glucose-Capillary: 143 mg/dL — ABNORMAL HIGH (ref 65–99)
Glucose-Capillary: 385 mg/dL — ABNORMAL HIGH (ref 65–99)

## 2015-04-02 LAB — MAGNESIUM: Magnesium: 1.8 mg/dL (ref 1.7–2.4)

## 2015-04-02 LAB — CBC
HEMATOCRIT: 34.8 % — AB (ref 36.0–46.0)
Hemoglobin: 11.2 g/dL — ABNORMAL LOW (ref 12.0–15.0)
MCH: 27.1 pg (ref 26.0–34.0)
MCHC: 32.2 g/dL (ref 30.0–36.0)
MCV: 84.3 fL (ref 78.0–100.0)
Platelets: 165 10*3/uL (ref 150–400)
RBC: 4.13 MIL/uL (ref 3.87–5.11)
RDW: 13 % (ref 11.5–15.5)
WBC: 5.3 10*3/uL (ref 4.0–10.5)

## 2015-04-02 LAB — BASIC METABOLIC PANEL
ANION GAP: 8 (ref 5–15)
BUN: 14 mg/dL (ref 6–20)
CHLORIDE: 104 mmol/L (ref 101–111)
CO2: 26 mmol/L (ref 22–32)
Calcium: 9 mg/dL (ref 8.9–10.3)
Creatinine, Ser: 0.74 mg/dL (ref 0.44–1.00)
GFR calc Af Amer: >60 mL/min (ref >60)
GFR calc non Af Amer: >60 mL/min (ref >60)
Glucose, Bld: 138 mg/dL — ABNORMAL HIGH (ref 65–99)
Potassium: 3.7 mmol/L (ref 3.5–5.1)
Sodium: 138 mmol/L (ref 135–145)

## 2015-04-02 MED ORDER — GABAPENTIN 100 MG PO CAPS: 100.0000 mg | ORAL_CAPSULE | Freq: Every day | ORAL | Status: AC

## 2015-04-02 MED ORDER — LISINOPRIL 40 MG PO TABS: 20.0000 mg | ORAL_TABLET | Freq: Every day | ORAL | Status: DC

## 2015-04-02 MED ORDER — LISINOPRIL 5 MG PO TABS
5.0000 mg | ORAL_TABLET | Freq: Every day | ORAL | Status: DC
  Administered 2015-04-02: 5 mg via ORAL
  Filled 2015-04-02: qty 1

## 2015-04-02 MED ORDER — INSULIN GLARGINE 100 UNIT/ML ~~LOC~~ SOLN: SUBCUTANEOUS | Status: AC

## 2015-04-02 MED ORDER — CEPHALEXIN 500 MG PO CAPS: 500.0000 mg | ORAL_CAPSULE | Freq: Two times a day (BID) | ORAL | Status: DC

## 2015-04-02 NOTE — Progress Notes (Signed)
ANTIBIOTIC CONSULT NOTE   Pharmacy Consult for Ceftriaxone Indication: UTI  Allergies  Allergen Reactions  . Metformin Diarrhea  . Pioglitazone Other (See Comments)    weight gain  . Vancomycin Rash    Erythematous rash, hands and toes became cyanotic.      Vital Signs: Temp: 98.3 F (36.8 C) (05/16 0503) Temp Source: Oral (05/16 0503) BP: 144/52 mmHg (05/16 0503) Pulse Rate: 62 (05/16 0503)  Labs:  Recent Labs  03/31/15 0506 04/01/15 0633  WBC 8.4 5.8  HGB 10.9* 11.3*  PLT 159 169  CREATININE 0.81 0.68   Estimated Creatinine Clearance: 69.7 mL/min (by C-G formula based on Cr of 0.68).  Medical History: Past Medical History  Diagnosis Date  . Hyperplastic colon polyp   . Diabetes mellitus type II   . Osteoarthritis   . GERD (gastroesophageal reflux disease)   . Osteopenia   . Lupus     of skin see dermatologist frequently  . OA (osteoarthritis of spine)     C-spine  . DVT (deep venous thrombosis)     a. right leg DVT 05-17-2008. b. h/o leg swelling of left leg 2-10, u/s showed a old clot. c. another DVT reported 07/2011.  . 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 Allergy   . Clotting disorder     hx of PE and DVT  . C. difficile diarrhea     hx of,   . Skin cancer     skin CA  . PONV (postoperative nausea and vomiting)   . Hypertension   . Breast cancer     left ductal breast ca dx 2003 approx, s/p XRt x 6 weeks, released from oncology (per  patient); dx right breast ca 2014 (in remission)  . Refusal of blood transfusions as patient is [employer]   . CAD (coronary artery disease)     a. NSTEMI 07/2014: s/p BMS to mid-distal LAD.  . Ischemic cardiomyopathy     a. cath 07/22/2014 EF 35-40%; Echo 07/22/2013 EF 65-78%, grade 1 diastolic dysfunction, large area of apical akinesis.  Marland Kitchen HTN (hypertension)   . Dyslipidemia   . Transaminitis     a. Mild during 07/2014 admission ? due to MI  . Diabetes mellitus  without complication   . Diastolic congestive heart failure     Assessment: 74 y/o F on ceftriaxone for Proteus mirabilis UTI.  She is afeb and WBC normal.  Proteus is pan sensitive.  Plan:  -Cont ceftriaxone 1g IV q24h -Change to po when appropriate.   Excell Seltzer Poteet 04/02/2015,7:23 AM

## 2015-04-02 NOTE — Progress Notes (Signed)
Pt. Received discharge instructions with daughter at bedside. Educated pt. On follow-up appointments explained importance of finishing out ABT for duration of treatment. Removed IV. No skin issues noted. All questions answered. No further needs noted at this time.

## 2015-04-02 NOTE — Progress Notes (Signed)
Inpatient Diabetes Program Recommendations  AACE/ADA: New Consensus Statement on Inpatient Glycemic Control (2013)  Target Ranges:  Prepandial:   less than 140 mg/dL      Peak postprandial:   less than 180 mg/dL (1-2 hours)      Critically ill patients:  140 - 180 mg/dL   Reason for Assessment:  Referral received.   Results for [name], [name] (MRN [id number]) as of 04/02/2015 12:13  Ref. Range 03/30/2015 03:44  Hemoglobin A1C Latest Ref Range: 4.8-5.6 % 11.3 (H)   Diabetes history: Type 2 diabetes Outpatient Diabetes medications: Lantus 15 units q AM, 30 units q HS Current orders for Inpatient glycemic control:  Lantus 20 units q AM and Lanuts 40 units q PM  Discussed elevated A1C with patient.  She states that blood sugars had been higher prior to admission.  She see's Dr. Posey Pronto in [facility] for her diabetes.  Advised her to follow-up with MD after discharge.  She understands that insulin doses will be increased at discharge.  States she wants information about diet since she does not cook anymore.  Will order "Living well with diabetes" book for patient.   Thanks, Adah Perl, RN, BC-ADM Inpatient Diabetes Coordinator Pager [phone] (8a-5p)

## 2015-04-02 NOTE — Telephone Encounter (Signed)
Please advise amlodipine request. Not on current med list? Has this been changed since hospital admission on 03/29/15?
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
## 2015-04-02 NOTE — Plan of Care (Signed)
Problem: Phase III Progression Outcomes Goal: Foley discontinued Outcome: Not Applicable Date Met:  95/32/02 Pt voids

## 2015-04-02 NOTE — Care Management Note (Signed)
Case Management Note  Patient Details  Name: [name] MRN: [id number] Date of Birth: Nov 11, 1941  Subjective/Objective:                 Patient from home with daughter, UTI,    Action/Plan:  HH PT ordered, CM requested RN as wel to follow with DM and pt's complaints about "stress" at her daughter's house, as she does not like her living situation or daughter's significant othetr. No evidence of neglect per MD. Expected Discharge Date:                  Expected Discharge Plan:  [place]  In-House Referral:  Clinical Social Work  Discharge planning Services  CM Consult  Post Acute Care Choice:  Home Health Choice offered to:  Patient  DME Arranged:    DME Agency:     HH Arranged:  RN, PT [place] Agency:  [facility]  Status of Service:  Completed, signed off  Medicare Important Message Given:  Yes Date Medicare IM Given:  04/02/15 Medicare IM give by:  Alasha Mcguinness rn bsn cm Date Additional Medicare IM Given:    Additional Medicare Important Message give by:     If discussed at [place] of Stay Meetings, dates discussed:    Additional Comments:  Carles Collet, RN 04/02/2015, 11:26 AM

## 2015-04-02 NOTE — Progress Notes (Signed)
Notified Miranda at [facility] RN and PT order with discharge today

## 2015-04-02 NOTE — Progress Notes (Signed)
Physical Therapy Treatment Patient Details Name: [name] MRN: [id number] DOB: [birth year] Today's Date: 04-25-2015    History of Present Illness 74 y/o female admitted with chills and AMS and found to have UTI.    PT Comments    Patient progressing with strength and endurance; reports already walked this AM.  Had less difficulty on turns with RW, but uses rollator at home.  Did discuss follow up outpatient PT, but agree right now HHPT most appropriate.  Will continue skilled PT in acute setting till d/c home.  Follow Up Recommendations  Home health PT     Equipment Recommendations  None recommended by PT    Recommendations for Other Services       Precautions / Restrictions Precautions Precautions: Fall    Mobility  Bed Mobility               General bed mobility comments: pt in chair  Transfers   Equipment used: Rolling walker (2 wheeled) Transfers: Sit to/from Stand Sit to Stand: Supervision         General transfer comment: assist for safety  Ambulation/Gait Ambulation/Gait assistance: Supervision Ambulation Distance (Feet): 400 Feet Assistive device: Rolling walker (2 wheeled) Gait Pattern/deviations: Step-through pattern;Trunk flexed     General Gait Details: cues for posture   Stairs            Wheelchair Mobility    Modified Rankin (Stroke Patients Only)       Balance           Standing balance support: Bilateral upper extremity supported;No upper extremity supported Standing balance-Leahy Scale: Fair Standing balance comment: able to walk short distance in room without walker, but uses walker for stability in hallway and in room in wider spaces                    Cognition Arousal/Alertness: Awake/alert Behavior During Therapy: WFL for tasks assessed/performed Overall Cognitive Status: Within Functional Limits for tasks assessed                      Exercises Other Exercises Other Exercises:  standing with back at wall, cues to perform chin tuck and scapular retraction with 10 second hold x 5 reps for postural strengthening    General Comments        Pertinent Vitals/Pain Pain Assessment: No/denies pain    Home Living                      Prior Function            PT Goals (current goals can now be found in the care plan section) Progress towards PT goals: Progressing toward goals    Frequency  Min 3X/week    PT Plan Current plan remains appropriate    Co-evaluation             End of Session Equipment Utilized During Treatment: Gait belt Activity Tolerance: Patient tolerated treatment well Patient left: in chair;with call bell/phone within reach;with chair alarm set     Time: 1030-1054 PT Time Calculation (min) (ACUTE ONLY): 24 min  Charges:  $Gait Training: 8-22 mins $Therapeutic Exercise: 8-22 mins                    G Codes:      [name],[name] Apr 25, 2015, 1:40 PM  Magda Kiel, [place] Apr 25, 2015

## 2015-04-02 NOTE — Progress Notes (Signed)
Utilization Review completed. Elisah Parmer RN BSN CM 

## 2015-04-03 ENCOUNTER — Telehealth: Payer: Self-pay | Admitting: *Deleted

## 2015-04-03 ENCOUNTER — Telehealth: Payer: Self-pay | Admitting: Family

## 2015-04-03 NOTE — Telephone Encounter (Signed)
Unable to reach patient at time of TCM Call. Left message for patient to return call when available.  

## 2015-04-03 NOTE — Telephone Encounter (Signed)
See additional phone note of 04/03/15 re: TCM call.

## 2015-04-03 NOTE — Addendum Note (Signed)
Addended by: Leticia Penna A on: 04/03/2015 09:02 AM   Modules accepted: Medications

## 2015-04-03 NOTE — Telephone Encounter (Signed)
Pt returning your call. Please call her again [phone].

## 2015-04-03 NOTE — Telephone Encounter (Signed)
This was d/c'd.  I will ask Caryl Pina to speak with pt about this when she calls her today to arrange her hospital follow up.

## 2015-04-03 NOTE — Telephone Encounter (Signed)
Please contact pt to arrange TCM hospital follow up.

## 2015-04-03 NOTE — Telephone Encounter (Addendum)
Transition Care Management Follow-up Telephone Call  How have you been since you were released from the hospital? Patient is still complaining of leg pain (given Gabapentin at discharge, but daughter has not picked it up yet)    Do you understand why you were in the hospital?  YES    Do you understand the discharge instrcutions?  YES   Items Reviewed:  Medications reviewed:YES - reviewed new antibiotics and gabapentin with patient, notified patient to stop Norvasc.  Patient was not able to review all of medications, she did not recognize the names- daughter is helping and [facility] RN is ordered.    Allergies reviewed: YES   Dietary changes reviewed: YES - low sodium  Referrals reviewed: YES    Functional Questionnaire:   Activities of Daily Living (ADLs):   She states they are independent in the following: ambulating,  States they require assistance with the following: medications    Any transportation issues/concerns?: NO- daughter will be driving    Any patient concerns? NO    Confirmed importance and date/time of follow-up visits scheduled: YES- scheduled with Debbrah Alar on 04/09/15 at 1:30.     Confirmed with patient if condition begins to worsen call PCP or go to the ER.  Patient was given the Call-a-Nurse line [phone]:

## 2015-04-04 ENCOUNTER — Telehealth: Payer: Self-pay | Admitting: Family

## 2015-04-04 NOTE — Telephone Encounter (Signed)
error 

## 2015-04-04 NOTE — Telephone Encounter (Signed)
Caller name: [place]  Can be reached: [phone]   Reason for call: Clair Gulling calling in reference to hospital visit- has been unsuccessful to reach PT for start of care after referral.

## 2015-04-04 NOTE — Telephone Encounter (Signed)
Left detailed message on [name]'s voicemail that I frequently have trouble reaching pt via telephone. Left pt's and daughter's phone #s for him to try and to call me if any further concerns.

## 2015-04-05 LAB — CULTURE, BLOOD (ROUTINE X 2)
Culture: NO GROWTH
Culture: NO GROWTH

## 2015-04-09 ENCOUNTER — Ambulatory Visit: Payer: PPO | Admitting: Family

## 2015-04-09 DIAGNOSIS — Z0289 Encounter for other administrative examinations: Secondary | ICD-10-CM

## 2015-04-19 ENCOUNTER — Telehealth: Payer: Self-pay | Admitting: Family

## 2015-04-19 NOTE — Telephone Encounter (Signed)
Pt was no show for appointment on 5/23- Charge?

## 2015-04-19 NOTE — Telephone Encounter (Signed)
Yes please

## 2015-04-30 ENCOUNTER — Other Ambulatory Visit: Payer: Self-pay | Admitting: Family

## 2015-05-01 NOTE — Telephone Encounter (Signed)
Sent denial to pharmacy for amlodipine Rx as pt is not supposed to be on this any longer.

## 2015-05-02 ENCOUNTER — Telehealth: Payer: Self-pay | Admitting: Family

## 2015-05-02 NOTE — Telephone Encounter (Signed)
Pt called in stating she missed a call. I advised her that it may be due to a medication refill denial. Please follow up with pt tomorrow. She asked for a call after 12:00pm on her cell # [phone].

## 2015-05-03 NOTE — Telephone Encounter (Signed)
Attempted to reach pt but mailbox is full and cannot accept messages.

## 2015-05-04 ENCOUNTER — Ambulatory Visit: Payer: PPO | Admitting: Family

## 2015-05-07 ENCOUNTER — Encounter: Payer: Self-pay | Admitting: Family

## 2015-05-07 ENCOUNTER — Ambulatory Visit (INDEPENDENT_AMBULATORY_CARE_PROVIDER_SITE_OTHER): Payer: PPO | Admitting: Family

## 2015-05-07 ENCOUNTER — Ambulatory Visit (HOSPITAL_BASED_OUTPATIENT_CLINIC_OR_DEPARTMENT_OTHER)
Admission: RE | Admit: 2015-05-07 | Discharge: 2015-05-07 | Disposition: A | Discharge status: Home / Self Care | Payer: PPO | Source: Ambulatory Visit | Attending: Family | Admitting: Family

## 2015-05-07 VITALS — BP 116/50 | HR 71 | Temp 98.5°F | Resp 18 | Ht 66.5 in | Wt 199.6 lb

## 2015-05-07 DIAGNOSIS — Z7902 Long term (current) use of antithrombotics/antiplatelets: Secondary | ICD-10-CM | POA: Insufficient documentation

## 2015-05-07 DIAGNOSIS — E118 Type 2 diabetes mellitus with unspecified complications: Secondary | ICD-10-CM

## 2015-05-07 DIAGNOSIS — Z86718 Personal history of other venous thrombosis and embolism: Secondary | ICD-10-CM | POA: Insufficient documentation

## 2015-05-07 DIAGNOSIS — L03032 Cellulitis of left toe: Secondary | ICD-10-CM | POA: Diagnosis not present

## 2015-05-07 DIAGNOSIS — E1165 Type 2 diabetes mellitus with hyperglycemia: Secondary | ICD-10-CM

## 2015-05-07 DIAGNOSIS — B002 Herpesviral gingivostomatitis and pharyngotonsillitis: Secondary | ICD-10-CM

## 2015-05-07 DIAGNOSIS — E785 Hyperlipidemia, unspecified: Secondary | ICD-10-CM

## 2015-05-07 DIAGNOSIS — M79662 Pain in left lower leg: Secondary | ICD-10-CM | POA: Diagnosis not present

## 2015-05-07 DIAGNOSIS — I10 Essential (primary) hypertension: Secondary | ICD-10-CM

## 2015-05-07 DIAGNOSIS — M7989 Other specified soft tissue disorders: Secondary | ICD-10-CM | POA: Diagnosis not present

## 2015-05-07 DIAGNOSIS — IMO0002 Reserved for concepts with insufficient information to code with codable children: Secondary | ICD-10-CM

## 2015-05-07 MED ORDER — VALACYCLOVIR HCL 1 G PO TABS: ORAL_TABLET | ORAL | Status: AC

## 2015-05-07 MED ORDER — CEPHALEXIN 500 MG PO CAPS: 500.0000 mg | ORAL_CAPSULE | Freq: Four times a day (QID) | ORAL | Status: DC

## 2015-05-07 MED ORDER — METOPROLOL SUCCINATE ER 50 MG PO TB24: 50.0000 mg | ORAL_TABLET | Freq: Every day | ORAL | Status: AC

## 2015-05-07 NOTE — Addendum Note (Signed)
Addended by: Kelle Darting A on: 05/07/2015 02:46 PM   Modules accepted: Orders

## 2015-05-07 NOTE — Progress Notes (Signed)
Pre visit review using our clinic review tool, if applicable. No additional management support is needed unless otherwise documented below in the visit note. 

## 2015-05-07 NOTE — Telephone Encounter (Signed)
Pt was seen in our office today.  

## 2015-05-07 NOTE — Addendum Note (Signed)
Addended by: Kelle Darting A on: 05/07/2015 02:42 PM   Modules accepted: Orders

## 2015-05-07 NOTE — Assessment & Plan Note (Signed)
Obtained culture. Start keflex. Pt to call if symptoms worsen, otherwise follow up in 1 week.

## 2015-05-07 NOTE — Assessment & Plan Note (Signed)
Need to rule out recurrent DVT.  Will obtain LLE doppler.

## 2015-05-07 NOTE — Assessment & Plan Note (Signed)
LDL at goal, continue statin. 

## 2015-05-07 NOTE — Progress Notes (Signed)
Subjective:    Patient ID: [name], female    DOB: 09-29-1941, 74 y.o.   MRN: [id number]  HPI   [name] is a 74 yr old female who presents today for follow up.  Patient presents today for follow up of multiple medical problems.  Diabetes Type 2  Pt is currently maintained on the following medications for diabetes: lantus 40 units HS and 20 units AM, novolin R (sliding scale). She is followed by Dr. Posey Pronto at [facility]- next appointment in July.   Lab Results  Component Value Date   HGBA1C 11.3* 03/30/2015   HGBA1C 9.3* 07/22/2014   HGBA1C 9.1* 06/29/2014    Lab Results  Component Value Date   MICROALBUR 3.3* 02/13/2015   LDLCALC 60 09/06/2014   CREATININE 0.74 04/02/2015   Home glucose readings range: AM fasting 130-140's.  She admits to not checking her sugars ac meals and not dosing the novolin.   Hyperlipidemia  Patient is currently maintained on the following medication for hyperlipidemia: pravachol Last lipid panel as follows:  Lab Results  Component Value Date   CHOL 114 09/06/2014   HDL 32* 09/06/2014   LDLCALC 60 09/06/2014   TRIG 112 09/06/2014   CHOLHDL 3.6 09/06/2014   Patient denies myalgia. Patient reports good compliance with low fat/low cholesterol diet.   Hypertension  Patient is currently maintained on the following medications for blood pressure: toprol xl (50mg ) lisinopril, lasix.  Patient reports good compliance with blood pressure medications. Patient denies chest pain, shortness of breath or swelling. Last 3 blood pressure readings in our office are as follows: BP Readings from Last 3 Encounters:  05/07/15 116/50  04/02/15 132/57  03/21/15 138/64   Reports that she could not afford PT.  Notes some issues with feeling like her legs are not as strong as they used to be.     Review of Systems    see HPI  Past Medical History  Diagnosis Date  . Hyperplastic colon polyp   . Diabetes mellitus type II   .  Osteoarthritis   . GERD (gastroesophageal reflux disease)   . Osteopenia   . Lupus     of skin see dermatologist frequently  . OA (osteoarthritis of spine)     C-spine  . DVT (deep venous thrombosis)     a. right leg DVT 05-17-2008. b. h/o leg swelling of left leg 2-10, u/s showed a old clot. c. another DVT reported 07/2011.  . 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 Allergy   . Clotting disorder     hx of PE and DVT  . C. difficile diarrhea     hx of,   . Skin cancer     skin CA  . PONV (postoperative nausea and vomiting)   . Hypertension   . Breast cancer     left ductal breast ca dx 2003 approx, s/p XRt x 6 weeks, released from oncology (per  patient); dx right breast ca 2014 (in remission)  . Refusal of blood transfusions as patient is Jehovah's Witness   . CAD (coronary artery disease)     a. NSTEMI 07/2014: s/p BMS to mid-distal LAD.  . Ischemic cardiomyopathy     a. cath 07/22/2014 EF 35-40%; Echo 07/22/2013 EF 62-95%, grade 1 diastolic dysfunction, large area of apical akinesis.  Marland Kitchen HTN (hypertension)   . Dyslipidemia   . Transaminitis     a. Mild during 07/2014 admission ? due  to MI  . Diabetes mellitus without complication   . Diastolic congestive heart failure     History   Social History  . Marital Status: Widowed    Spouse Name: N/A  . Number of Children: 3  . Years of Education: N/A   Occupational History  . Retired    Social History Main Topics  . Smoking status: Never Smoker   . Smokeless tobacco: Never Used     Comment: Never  Used Tobacco  . Alcohol Use: No  . Drug Use: No  . Sexual Activity: Not Currently   Other Topics Concern  . Not on file   Social History Narrative   Single-widow    3 children , 1 in [place] (son, daughter - [place])    [place] to [place] 2002 from [place], [place]   Alcohol Use - no      tobacco-- never     Illicit Drug Use - no    Pt is Jehovah's Witness-No blood products   Daughter -  Sonny Poth           Past Surgical History  Procedure Laterality Date  . Cholecystectomy    . Breast biopsy      left breast and right breast  . Breast lumpectomy      left breast-ductal carcinoma in situ  . Appendectomy    . Skin cancer excision      from back   . Breast lumpectomy with needle localization and axillary sentinel lymph node bx Right 06/17/2013    Procedure: BREAST LUMPECTOMY WITH NEEDLE LOCALIZATION AND AXILLARY SENTINEL LYMPH NODE BX;  Surgeon: Merrie Roof, MD;  Location: [place];  Service: General;  Laterality: Right;  . Irrigation and debridement abscess Right 02/18/2014    Procedure: IRRIGATION AND DEBRIDEMENT RIGHT AXILLARY ABSCESS;  Surgeon: Leighton Ruff, MD;  Location: [facility];  Service: General;  Laterality: Right;  . Left heart cath N/A 07/22/2014    Procedure: LEFT HEART CATH;  Surgeon: Lorretta Harp, MD;  Location: [facility] CATH [facility];  Service: Cardiovascular;  Laterality: N/A;    Family History  Problem Relation Age of Onset  . Diabetes Mother   . Diabetes    . Heart attack Neg Hx   . Colon cancer Neg Hx   . Breast cancer Neg Hx   . Esophageal cancer Neg Hx   . Rectal cancer Neg Hx   . Stomach cancer Neg Hx   . Cancer Father     melanoma  . Diabetes Brother   . Other Brother     amputation    Allergies  Allergen Reactions  . Metformin Diarrhea  . Pioglitazone Other (See Comments)    weight gain  . Vancomycin Rash    Erythematous rash, hands and toes became cyanotic.      Current Outpatient Prescriptions on File Prior to Visit  Medication Sig Dispense Refill  . anastrozole (ARIMIDEX) 1 MG tablet Take 1 tablet (1 mg total) by mouth daily. 30 tablet 6  . B Complex-C (SUPER B COMPLEX PO) Take 1 tablet by mouth daily.    . Calcium Carbonate-Vitamin D (CALCIUM-VITAMIN D) 600-200 MG-UNIT CAPS Take 1 tablet by mouth daily.     . clopidogrel (PLAVIX) 75 MG tablet Take 1 tablet (75 mg total) by mouth daily. 30 tablet 12  . furosemide (LASIX) 20 MG  tablet TAKE 1 TABLET (20 MG TOTAL) BY MOUTH DAILY AS NEEDED. 30 tablet 1  . gabapentin (NEURONTIN) 100 MG capsule  Take 1 capsule (100 mg total) by mouth at bedtime. 30 capsule 0  . Glucosamine-Chondroitin (GLUCOSAMINE CHONDR COMPLEX PO) Take 1 capsule by mouth daily.    . insulin glargine (LANTUS) 100 UNIT/ML injection Take 20 units in the morning and 40 units at bedtime. 10 mL 11  . insulin regular (NOVOLIN R,HUMULIN R) 100 units/mL injection Inject into the skin 3 (three) times daily before meals. Sliding scale 16-30  Units: Humulin    . lisinopril (PRINIVIL,ZESTRIL) 40 MG tablet Take 0.5 tablets (20 mg total) by mouth daily. 30 tablet 2  . pravastatin (PRAVACHOL) 40 MG tablet Take 1 tablet (40 mg total) by mouth every evening. 90 tablet 3  . Rivaroxaban (XARELTO) 15 MG TABS tablet Take 1 tablet (15 mg total) by mouth daily with supper. 30 tablet 11  . metoprolol succinate (TOPROL-XL) 25 MG 24 hr tablet Take 1 tablet (25 mg total) by mouth daily. Take with or immediately following a meal. 90 tablet 3  . nitroGLYCERIN (NITROSTAT) 0.4 MG SL tablet Place 1 tablet (0.4 mg total) under the tongue every 5 (five) minutes as needed for chest pain (up to 3 doses - if no relief, call 911). 25 tablet 3   Current Facility-Administered Medications on File Prior to Visit  Medication Dose Route Frequency Provider Last Rate Last Dose  . silver sulfADIAZINE (SILVADENE) 1 % cream   Topical Daily Tyler Pita, MD        BP 116/50 mmHg  Pulse 71  Temp(Src) 98.5 F (36.9 C) (Oral)  Resp 18  Ht 5' 6.5" (1.689 m)  Wt 199 lb 9.6 oz (90.538 kg)  BMI 31.74 kg/m2  SpO2 97%    Objective:   Physical Exam  Constitutional: She is oriented to person, place, and time. She appears well-developed and well-nourished.  HENT:  Head: Normocephalic and atraumatic.  + tiny vesicels noted right upper inner lip with some associated swelling of right upper lip.   Cardiovascular: Normal rate, regular rhythm and normal  heart sounds.   No murmur heard. 1+ RLE edema 2-3+ LLE edema  Pulmonary/Chest: Effort normal and breath sounds normal. No respiratory distress. She has no wheezes.  Neurological: She is alert and oriented to person, place, and time.  Skin:  Left great to- mildly swollen, mild erythema, some puss noted surrounding nailbed    Psychiatric: She has a normal mood and affect. Her behavior is normal. Judgment and thought content normal.          Assessment & Plan:  I did discuss with the patient looking into silver sneakers membership through her insurance.  She could benefit from regular exercise such as water aerobics, stationary bike.

## 2015-05-07 NOTE — Patient Instructions (Signed)
Start Valtrex (for cold sore on lip). Start Keflex- for infected toenail. Call if increased pain/redness swelling of the left big toe. Follow up in 1 week.

## 2015-05-07 NOTE — Assessment & Plan Note (Signed)
rx with valtrex.

## 2015-05-07 NOTE — Assessment & Plan Note (Signed)
Uncontrolled. Reinforced importance of meal time coverage compliance.

## 2015-05-08 LAB — BASIC METABOLIC PANEL
BUN: 15 mg/dL (ref 6–23)
CO2: 25 mEq/L (ref 19–32)
Calcium: 9.3 mg/dL (ref 8.4–10.5)
Chloride: 102 mEq/L (ref 96–112)
Creatinine, Ser: 0.81 mg/dL (ref 0.40–1.20)
GFR: 73.28 mL/min (ref >60.00)
GLUCOSE: 357 mg/dL — AB (ref 70–99)
Potassium: 4 mEq/L (ref 3.5–5.1)
Sodium: 136 mEq/L (ref 135–145)

## 2015-05-10 LAB — WOUND CULTURE
GRAM STAIN: NONE SEEN
Gram Stain: NONE SEEN
Gram Stain: NONE SEEN

## 2015-05-16 ENCOUNTER — Ambulatory Visit (INDEPENDENT_AMBULATORY_CARE_PROVIDER_SITE_OTHER): Payer: PPO | Admitting: Family

## 2015-05-16 ENCOUNTER — Encounter: Payer: Self-pay | Admitting: Family

## 2015-05-16 VITALS — BP 112/58 | HR 67 | Temp 97.7°F | Resp 18 | Ht 66.0 in | Wt 198.0 lb

## 2015-05-16 DIAGNOSIS — L03032 Cellulitis of left toe: Secondary | ICD-10-CM | POA: Diagnosis not present

## 2015-05-16 MED ORDER — DOXYCYCLINE HYCLATE 100 MG PO TABS: 100.0000 mg | ORAL_TABLET | Freq: Two times a day (BID) | ORAL | Status: AC

## 2015-05-16 NOTE — Patient Instructions (Signed)
Start doxycycline.  Soak right foot in warm water for 15 minutes 2x daily.   You will be contacted about your referral to the foot doctor. Please let us know if you have not heard back within 1 week about your referral. Call if increased pain/swelling/redness in the left toe.

## 2015-05-16 NOTE — Progress Notes (Signed)
Subjective:    Patient ID: [name], female    DOB: 06/18/1941, 74 y.o.   MRN: [id number]  HPI  [name] is a 74 yr old female who presents today for follow up of her LLE cellulitis.  [place] was negative for DVT. She was placed on keflex.  She completed keflex 2 days ago. Notes toe is still sore and draining.   Review of Systems See HPI  Past Medical History  Diagnosis Date  . Hyperplastic colon polyp   . Diabetes mellitus type II   . Osteoarthritis   . GERD (gastroesophageal reflux disease)   . Osteopenia   . Lupus     of skin see dermatologist frequently  . OA (osteoarthritis of spine)     C-spine  . DVT (deep venous thrombosis)     a. right leg DVT 05-17-2008. b. h/o leg swelling of left leg 2-10, u/s showed a old clot. c. another DVT reported 07/2011.  . 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 Allergy   . Clotting disorder     hx of PE and DVT  . C. difficile diarrhea     hx of,   . Skin cancer     skin CA  . PONV (postoperative nausea and vomiting)   . Hypertension   . Breast cancer     left ductal breast ca dx 2003 approx, s/p XRt x 6 weeks, released from oncology (per  patient); dx right breast ca 2014 (in remission)  . Refusal of blood transfusions as patient is Jehovah's Witness   . CAD (coronary artery disease)     a. NSTEMI 07/2014: s/p BMS to mid-distal LAD.  . Ischemic cardiomyopathy     a. cath 07/22/2014 EF 35-40%; Echo 07/22/2013 EF 24-09%, grade 1 diastolic dysfunction, large area of apical akinesis.  Marland Kitchen HTN (hypertension)   . Dyslipidemia   . Transaminitis     a. Mild during 07/2014 admission ? due to MI  . Diabetes mellitus without complication   . Diastolic congestive heart failure     History   Social History  . Marital Status: Widowed    Spouse Name: N/A  . Number of Children: 3  . Years of Education: N/A   Occupational History  . Retired    Social History Main Topics  . Smoking status:  Never Smoker   . Smokeless tobacco: Never Used     Comment: Never  Used Tobacco  . Alcohol Use: No  . Drug Use: No  . Sexual Activity: Not Currently   Other Topics Concern  . Not on file   Social History Narrative   Single-widow    3 children , 1 in [place] (son, daughter - [place])    [place] to [place] 2002 from [place], [place]   Alcohol Use - no      tobacco-- never     Illicit Drug Use - no    Pt is Jehovah's Witness-No blood products   Daughter - [name]           Past Surgical History  Procedure Laterality Date  . Cholecystectomy    . Breast biopsy      left breast and right breast  . Breast lumpectomy      left breast-ductal carcinoma in situ  . Appendectomy    . Skin cancer excision      from back   . Breast lumpectomy with needle localization and axillary  sentinel lymph node bx Right 06/17/2013    Procedure: BREAST LUMPECTOMY WITH NEEDLE LOCALIZATION AND AXILLARY SENTINEL LYMPH NODE BX;  Surgeon: Merrie Roof, MD;  Location: [place];  Service: General;  Laterality: Right;  . Irrigation and debridement abscess Right 02/18/2014    Procedure: IRRIGATION AND DEBRIDEMENT RIGHT AXILLARY ABSCESS;  Surgeon: Leighton Ruff, MD;  Location: [facility];  Service: General;  Laterality: Right;  . Left heart cath N/A 07/22/2014    Procedure: LEFT HEART CATH;  Surgeon: Lorretta Harp, MD;  Location: [facility] CATH LAB;  Service: Cardiovascular;  Laterality: N/A;    Family History  Problem Relation Age of Onset  . Diabetes Mother   . Diabetes    . Heart attack Neg Hx   . Colon cancer Neg Hx   . Breast cancer Neg Hx   . Esophageal cancer Neg Hx   . Rectal cancer Neg Hx   . Stomach cancer Neg Hx   . Cancer Father     melanoma  . Diabetes Brother   . Other Brother     amputation    Allergies  Allergen Reactions  . Metformin Diarrhea  . Pioglitazone Other (See Comments)    weight gain  . Vancomycin Rash    Erythematous rash, hands and toes became cyanotic.      Current  Outpatient Prescriptions on File Prior to Visit  Medication Sig Dispense Refill  . anastrozole (ARIMIDEX) 1 MG tablet Take 1 tablet (1 mg total) by mouth daily. 30 tablet 6  . B Complex-C (SUPER B COMPLEX PO) Take 1 tablet by mouth daily.    . Calcium Carbonate-Vitamin D (CALCIUM-VITAMIN D) 600-200 MG-UNIT CAPS Take 1 tablet by mouth daily.     . clopidogrel (PLAVIX) 75 MG tablet Take 1 tablet (75 mg total) by mouth daily. 30 tablet 12  . furosemide (LASIX) 20 MG tablet TAKE 1 TABLET (20 MG TOTAL) BY MOUTH DAILY AS NEEDED. 30 tablet 1  . gabapentin (NEURONTIN) 100 MG capsule Take 1 capsule (100 mg total) by mouth at bedtime. 30 capsule 0  . Glucosamine-Chondroitin (GLUCOSAMINE CHONDR COMPLEX PO) Take 1 capsule by mouth daily.    . insulin glargine (LANTUS) 100 UNIT/ML injection Take 20 units in the morning and 40 units at bedtime. 10 mL 11  . insulin regular (NOVOLIN R,HUMULIN R) 100 units/mL injection Inject into the skin 3 (three) times daily before meals. Sliding scale 16-30  Units: Humulin    . lisinopril (PRINIVIL,ZESTRIL) 40 MG tablet Take 0.5 tablets (20 mg total) by mouth daily. 30 tablet 2  . metoprolol succinate (TOPROL XL) 50 MG 24 hr tablet Take 1 tablet (50 mg total) by mouth daily. Take with or immediately following a meal.    . nitroGLYCERIN (NITROSTAT) 0.4 MG SL tablet Place 1 tablet (0.4 mg total) under the tongue every 5 (five) minutes as needed for chest pain (up to 3 doses - if no relief, call 911). 25 tablet 3  . pravastatin (PRAVACHOL) 40 MG tablet Take 1 tablet (40 mg total) by mouth every evening. 90 tablet 3  . Rivaroxaban (XARELTO) 15 MG TABS tablet Take 1 tablet (15 mg total) by mouth daily with supper. 30 tablet 11  . valACYclovir (VALTREX) 1000 MG tablet Take 2 tabs now and 2 tabs in 12 hours 4 tablet 0   Current Facility-Administered Medications on File Prior to Visit  Medication Dose Route Frequency Provider Last Rate Last Dose  . silver sulfADIAZINE (SILVADENE) 1 %  cream  Topical Daily Tyler Pita, MD        BP 112/58 mmHg  Pulse 67  Temp(Src) 97.7 F (36.5 C) (Oral)  Resp 18  Ht 5\' 6"  (1.676 m)  Wt 198 lb (89.812 kg)  BMI 31.97 kg/m2  SpO2 98%       Objective:   Physical Exam  Constitutional: She is oriented to person, place, and time. She appears well-developed and well-nourished. No distress.  Neurological: She is alert and oriented to person, place, and time.  Skin:  L great toe remains erythematous, some crusting/scabbing at the edge of the left nailbed.   Psychiatric: She has a normal mood and affect. Her behavior is normal. Judgment and thought content normal.          Assessment & Plan:

## 2015-05-16 NOTE — Assessment & Plan Note (Addendum)
Unchanged. Not worse though. Start keflex, refer to podiatry (I wonder if she may need I and D near the nailbed). Follow up with me in 1 week. Recommend daily bid foot soaks.

## 2015-05-21 ENCOUNTER — Other Ambulatory Visit: Payer: Self-pay | Admitting: Family

## 2015-05-23 ENCOUNTER — Other Ambulatory Visit: Payer: Self-pay | Admitting: Cardiology

## 2015-05-24 ENCOUNTER — Ambulatory Visit: Payer: PPO | Admitting: Family

## 2015-05-24 ENCOUNTER — Institutional Professional Consult (permissible substitution): Payer: PPO | Admitting: Pulmonary Disease

## 2015-05-24 NOTE — Telephone Encounter (Signed)
Rx(s) sent to pharmacy electronically.  

## 2015-05-25 ENCOUNTER — Ambulatory Visit: Payer: PPO | Admitting: Hematology & Oncology

## 2015-05-25 ENCOUNTER — Other Ambulatory Visit: Payer: PPO

## 2015-05-25 ENCOUNTER — Telehealth: Payer: Self-pay | Admitting: *Deleted

## 2015-05-25 NOTE — Telephone Encounter (Signed)
Appointment cancelled today due to patient not feeling well. She states her illness does not require any work-up, she just doesn't feel well. Asked to reschedule and she states she will call back at a later time.

## 2015-06-04 ENCOUNTER — Telehealth: Payer: Self-pay | Admitting: Hematology & Oncology

## 2015-06-04 NOTE — Telephone Encounter (Signed)
SPOKE WITH PT REGARDING SCHEDULEING FUTURE APPT, PT WANTS TO DISCUSS TRANSPORTATION WITH DAUGHTER AND WILL CALL BACK

## 2015-06-12 ENCOUNTER — Telehealth: Payer: Self-pay | Admitting: Hematology & Oncology

## 2015-06-12 NOTE — Telephone Encounter (Signed)
RETURNED PT'S CALL TO SCHEDULE A FUTURE APPT. PT CONFIRMED NEW APPT

## 2015-06-19 ENCOUNTER — Other Ambulatory Visit: Payer: Self-pay | Admitting: Hematology & Oncology

## 2015-06-26 ENCOUNTER — Telehealth: Payer: Self-pay | Admitting: *Deleted

## 2015-06-26 MED ORDER — LISINOPRIL 20 MG PO TABS: 20.0000 mg | ORAL_TABLET | Freq: Every day | ORAL | Status: AC

## 2015-06-26 NOTE — Telephone Encounter (Signed)
Received fax from [facility] requesting Lisinopril Rx for 20mg  once a day.  Pt currently taking 40mg , 1/2 tablet daily and pt doesn't want to cut it in half any longer.  Please advise?

## 2015-06-26 NOTE — Telephone Encounter (Signed)
Rx sent for 20mg  tablets once a day. Med list updated.

## 2015-06-26 NOTE — Telephone Encounter (Signed)
Ok to send 20mg  tabs.

## 2015-06-27 ENCOUNTER — Telehealth: Payer: Self-pay | Admitting: Family

## 2015-06-27 NOTE — Telephone Encounter (Signed)
Patient Name: [name] DOB: April 22, 1941 Initial Comment Caller states her fingers lock in at night. She is not able to open her fingers. She also has a lump going up her right arm Nurse Assessment Nurse: Marcelline Deist, RN, Kermit Balo Date/Time (Eastern Time): 06/27/2015 12:16:08 PM Confirm and document reason for call. If symptomatic, describe symptoms. ---Caller states her fingers lock in at night. She is not able to open her fingers, they are painful. Her fingers are sometimes swollen & takes awhile to open them up. Has had symptoms for 3 weeks. She also has a lump going up her right arm from inside of elbow area. The lump is tender. No fever. Has the patient traveled out of the country within the last 30 days? ---Not Applicable Does the patient require triage? ---Yes Related visit to physician within the last 2 weeks? ---No Does the PT have any chronic conditions? (i.e. diabetes, asthma, etc.) ---Yes List chronic conditions. ---diabetic, on blood thinners for clots in legs & lungs, arthritis Guidelines Guideline Title Affirmed Question Affirmed Notes Finger Pain [1] MODERATE pain (e.g., interferes with normal activities) AND [2] present > 3 days Skin Lump or Localized Swelling [1] Swelling is red AND [2] size > 2 inches (5.0 cm) (Exception: itchy area of skin) Final Disposition User See Physician within 4 Hours (or PCP triage) Marcelline Deist, RN, Lynda Comments Caller triaged initially for finger pain. She then told the nurse that the lump/lumps on her right arm are the size of a hard boiled egg. Triaged for that as well. She states the skin is red & tender when she touches it. Has hx of blood clots. Advised to be seen within 3-4 hrs. at ER. Referrals [facility] - ED Disagree/Comply: Comply Disagree/Comply: Comply

## 2015-06-27 NOTE — Telephone Encounter (Signed)
Called to follow up with patient.  Spoke with daughter who stated that patient was currently at [facility] off of [address] in [place], [place].  Daughter is there with patient.  No further needs voiced at this time.     Routed message to PCP for FYI.

## 2015-07-11 ENCOUNTER — Encounter (HOSPITAL_COMMUNITY): Payer: Self-pay | Admitting: Emergency Medicine

## 2015-07-11 ENCOUNTER — Inpatient Hospital Stay (HOSPITAL_COMMUNITY)
Admission: EM | Admit: 2015-07-11 | Discharge: 2015-07-19 | DRG: 296 | Disposition: E | Payer: PPO | Attending: Internal Medicine | Admitting: Internal Medicine

## 2015-07-11 ENCOUNTER — Inpatient Hospital Stay (HOSPITAL_COMMUNITY): Payer: PPO

## 2015-07-11 ENCOUNTER — Emergency Department (HOSPITAL_COMMUNITY): Payer: PPO

## 2015-07-11 DIAGNOSIS — E119 Type 2 diabetes mellitus without complications: Secondary | ICD-10-CM | POA: Diagnosis present

## 2015-07-11 DIAGNOSIS — I5032 Chronic diastolic (congestive) heart failure: Secondary | ICD-10-CM | POA: Diagnosis present

## 2015-07-11 DIAGNOSIS — M1990 Unspecified osteoarthritis, unspecified site: Secondary | ICD-10-CM | POA: Diagnosis present

## 2015-07-11 DIAGNOSIS — Z01818 Encounter for other preprocedural examination: Secondary | ICD-10-CM

## 2015-07-11 DIAGNOSIS — J690 Pneumonitis due to inhalation of food and vomit: Secondary | ICD-10-CM | POA: Diagnosis present

## 2015-07-11 DIAGNOSIS — K7200 Acute and subacute hepatic failure without coma: Secondary | ICD-10-CM | POA: Diagnosis present

## 2015-07-11 DIAGNOSIS — J9600 Acute respiratory failure, unspecified whether with hypoxia or hypercapnia: Secondary | ICD-10-CM | POA: Diagnosis present

## 2015-07-11 DIAGNOSIS — J9601 Acute respiratory failure with hypoxia: Secondary | ICD-10-CM | POA: Diagnosis not present

## 2015-07-11 DIAGNOSIS — Z7901 Long term (current) use of anticoagulants: Secondary | ICD-10-CM

## 2015-07-11 DIAGNOSIS — G9340 Encephalopathy, unspecified: Secondary | ICD-10-CM | POA: Diagnosis present

## 2015-07-11 DIAGNOSIS — R001 Bradycardia, unspecified: Secondary | ICD-10-CM | POA: Diagnosis not present

## 2015-07-11 DIAGNOSIS — E785 Hyperlipidemia, unspecified: Secondary | ICD-10-CM | POA: Diagnosis present

## 2015-07-11 DIAGNOSIS — Z86711 Personal history of pulmonary embolism: Secondary | ICD-10-CM

## 2015-07-11 DIAGNOSIS — I255 Ischemic cardiomyopathy: Secondary | ICD-10-CM | POA: Diagnosis present

## 2015-07-11 DIAGNOSIS — M479 Spondylosis, unspecified: Secondary | ICD-10-CM | POA: Diagnosis present

## 2015-07-11 DIAGNOSIS — I252 Old myocardial infarction: Secondary | ICD-10-CM

## 2015-07-11 DIAGNOSIS — Z955 Presence of coronary angioplasty implant and graft: Secondary | ICD-10-CM | POA: Diagnosis not present

## 2015-07-11 DIAGNOSIS — I4581 Long QT syndrome: Secondary | ICD-10-CM | POA: Diagnosis present

## 2015-07-11 DIAGNOSIS — Z7902 Long term (current) use of antithrombotics/antiplatelets: Secondary | ICD-10-CM | POA: Diagnosis not present

## 2015-07-11 DIAGNOSIS — Z86718 Personal history of other venous thrombosis and embolism: Secondary | ICD-10-CM

## 2015-07-11 DIAGNOSIS — G931 Anoxic brain damage, not elsewhere classified: Secondary | ICD-10-CM | POA: Diagnosis present

## 2015-07-11 DIAGNOSIS — J9621 Acute and chronic respiratory failure with hypoxia: Secondary | ICD-10-CM | POA: Diagnosis not present

## 2015-07-11 DIAGNOSIS — Z85820 Personal history of malignant melanoma of skin: Secondary | ICD-10-CM | POA: Diagnosis not present

## 2015-07-11 DIAGNOSIS — R569 Unspecified convulsions: Secondary | ICD-10-CM | POA: Diagnosis not present

## 2015-07-11 DIAGNOSIS — I469 Cardiac arrest, cause unspecified: Principal | ICD-10-CM | POA: Diagnosis present

## 2015-07-11 DIAGNOSIS — K219 Gastro-esophageal reflux disease without esophagitis: Secondary | ICD-10-CM | POA: Diagnosis present

## 2015-07-11 DIAGNOSIS — J9690 Respiratory failure, unspecified, unspecified whether with hypoxia or hypercapnia: Secondary | ICD-10-CM

## 2015-07-11 DIAGNOSIS — Z66 Do not resuscitate: Secondary | ICD-10-CM | POA: Diagnosis present

## 2015-07-11 DIAGNOSIS — Z452 Encounter for adjustment and management of vascular access device: Secondary | ICD-10-CM

## 2015-07-11 DIAGNOSIS — Z794 Long term (current) use of insulin: Secondary | ICD-10-CM

## 2015-07-11 DIAGNOSIS — I2510 Atherosclerotic heart disease of native coronary artery without angina pectoris: Secondary | ICD-10-CM | POA: Diagnosis present

## 2015-07-11 DIAGNOSIS — I10 Essential (primary) hypertension: Secondary | ICD-10-CM | POA: Diagnosis present

## 2015-07-11 DIAGNOSIS — Z86010 Personal history of colonic polyps: Secondary | ICD-10-CM

## 2015-07-11 DIAGNOSIS — E669 Obesity, unspecified: Secondary | ICD-10-CM | POA: Diagnosis present

## 2015-07-11 DIAGNOSIS — M8580 Other specified disorders of bone density and structure, unspecified site: Secondary | ICD-10-CM | POA: Diagnosis present

## 2015-07-11 DIAGNOSIS — R4020 Unspecified coma: Secondary | ICD-10-CM | POA: Insufficient documentation

## 2015-07-11 DIAGNOSIS — E876 Hypokalemia: Secondary | ICD-10-CM | POA: Diagnosis not present

## 2015-07-11 DIAGNOSIS — Z853 Personal history of malignant neoplasm of breast: Secondary | ICD-10-CM | POA: Diagnosis not present

## 2015-07-11 DIAGNOSIS — Z6833 Body mass index (BMI) 33.0-33.9, adult: Secondary | ICD-10-CM | POA: Diagnosis not present

## 2015-07-11 DIAGNOSIS — R40243 Glasgow coma scale score 3-8: Secondary | ICD-10-CM | POA: Diagnosis not present

## 2015-07-11 LAB — POCT I-STAT 3, ART BLOOD GAS (G3+)
ACID-BASE DEFICIT: 9 mmol/L — AB (ref 0.0–2.0)
Bicarbonate: 18.7 mEq/L — ABNORMAL LOW (ref 20.0–24.0)
O2 SAT: 100 %
PCO2 ART: 51.2 mmHg — AB (ref 35.0–45.0)
PH ART: 7.177 — AB (ref 7.350–7.450)
Patient temperature: 100.6
TCO2: 20 mmol/L (ref 0–100)
pO2, Arterial: >450 mmHg — ABNORMAL HIGH (ref 80.0–100.0)

## 2015-07-11 LAB — CBC WITH DIFFERENTIAL/PLATELET
Basophils Absolute: 0 10*3/uL (ref 0.0–0.1)
Basophils Relative: 0 % (ref 0–1)
EOS ABS: 0 10*3/uL (ref 0.0–0.7)
Eosinophils Relative: 0 % (ref 0–5)
HCT: 39.1 % (ref 36.0–46.0)
Hemoglobin: 12.5 g/dL (ref 12.0–15.0)
Lymphocytes Relative: 19 % (ref 12–46)
Lymphs Abs: 2.8 10*3/uL (ref 0.7–4.0)
MCH: 28.1 pg (ref 26.0–34.0)
MCHC: 32 g/dL (ref 30.0–36.0)
MCV: 87.9 fL (ref 78.0–100.0)
MONO ABS: 0.6 10*3/uL (ref 0.1–1.0)
Monocytes Relative: 4 % (ref 3–12)
NEUTROS ABS: 11.5 10*3/uL — AB (ref 1.7–7.7)
Neutrophils Relative %: 77 % (ref 43–77)
PLATELETS: 188 10*3/uL (ref 150–400)
RBC: 4.45 MIL/uL (ref 3.87–5.11)
RDW: 14 % (ref 11.5–15.5)
WBC: 14.9 10*3/uL — ABNORMAL HIGH (ref 4.0–10.5)

## 2015-07-11 LAB — COMPREHENSIVE METABOLIC PANEL
ALBUMIN: 3.2 g/dL — AB (ref 3.5–5.0)
ALT: 522 U/L — AB (ref 14–54)
AST: 555 U/L — ABNORMAL HIGH (ref 15–41)
Alkaline Phosphatase: 107 U/L (ref 38–126)
Anion gap: 22 — ABNORMAL HIGH (ref 5–15)
BUN: 18 mg/dL (ref 6–20)
CALCIUM: 8.6 mg/dL — AB (ref 8.9–10.3)
CO2: 12 mmol/L — ABNORMAL LOW (ref 22–32)
CREATININE: 1.19 mg/dL — AB (ref 0.44–1.00)
Chloride: 105 mmol/L (ref 101–111)
GFR calc non Af Amer: 44 mL/min — ABNORMAL LOW (ref >60)
GFR, EST AFRICAN AMERICAN: 50 mL/min — AB (ref >60)
GLUCOSE: 223 mg/dL — AB (ref 65–99)
Potassium: 3.8 mmol/L (ref 3.5–5.1)
SODIUM: 139 mmol/L (ref 135–145)
Total Bilirubin: 0.8 mg/dL (ref 0.3–1.2)
Total Protein: 6.6 g/dL (ref 6.5–8.1)

## 2015-07-11 LAB — URINE MICROSCOPIC-ADD ON

## 2015-07-11 LAB — URINALYSIS, ROUTINE W REFLEX MICROSCOPIC
BILIRUBIN URINE: NEGATIVE
GLUCOSE, UA: 250 mg/dL — AB
KETONES UR: NEGATIVE mg/dL
Leukocytes, UA: NEGATIVE
Nitrite: NEGATIVE
PH: 6.5 (ref 5.0–8.0)
Protein, ur: 100 mg/dL — AB
Specific Gravity, Urine: 1.009 (ref 1.005–1.030)
Urobilinogen, UA: 0.2 mg/dL (ref 0.0–1.0)

## 2015-07-11 LAB — GLUCOSE, CAPILLARY: GLUCOSE-CAPILLARY: 130 mg/dL — AB (ref 65–99)

## 2015-07-11 LAB — LACTIC ACID, PLASMA: Lactic Acid, Venous: 8.8 mmol/L (ref 0.5–2.0)

## 2015-07-11 LAB — TROPONIN I: Troponin I: 0.04 ng/mL — ABNORMAL HIGH (ref <0.031)

## 2015-07-11 MED ORDER — MIDAZOLAM HCL 2 MG/2ML IJ SOLN: 1.0000 mg | INTRAMUSCULAR | Status: DC | PRN

## 2015-07-11 MED ORDER — FENTANYL CITRATE (PF) 100 MCG/2ML IJ SOLN
50.0000 ug | Freq: Once | INTRAMUSCULAR | Status: AC
  Administered 2015-07-11: 50 ug via INTRAVENOUS

## 2015-07-11 MED ORDER — LORAZEPAM 2 MG/ML IJ SOLN
INTRAMUSCULAR | Status: AC
  Administered 2015-07-11: 2 mg via INTRAVENOUS
  Filled 2015-07-11: qty 1

## 2015-07-11 MED ORDER — MIDAZOLAM HCL 2 MG/2ML IJ SOLN
2.0000 mg | Freq: Once | INTRAMUSCULAR | Status: AC
  Administered 2015-07-11: 2 mg via INTRAVENOUS

## 2015-07-11 MED ORDER — SODIUM CHLORIDE 0.9 % IV SOLN: 250.0000 mL | INTRAVENOUS | Status: DC | PRN

## 2015-07-11 MED ORDER — FENTANYL CITRATE (PF) 2500 MCG/50ML IJ SOLN
25.0000 ug/h | INTRAMUSCULAR | Status: DC
  Administered 2015-07-11: 50 ug/h via INTRAVENOUS
  Filled 2015-07-11: qty 50

## 2015-07-11 MED ORDER — MIDAZOLAM HCL 2 MG/2ML IJ SOLN
1.0000 mg | INTRAMUSCULAR | Status: DC | PRN
  Administered 2015-07-12 (×2): 1 mg via INTRAVENOUS
  Filled 2015-07-11 (×3): qty 2

## 2015-07-11 MED ORDER — PROPOFOL 1000 MG/100ML IV EMUL
INTRAVENOUS | Status: AC
Start: 2015-07-11 — End: 2015-07-11
  Filled 2015-07-11: qty 100

## 2015-07-11 MED ORDER — SODIUM CHLORIDE 0.9 % IV SOLN
INTRAVENOUS | Status: DC
  Administered 2015-07-11 – 2015-07-15 (×4): via INTRAVENOUS

## 2015-07-11 MED ORDER — SODIUM CHLORIDE 0.9 % IV SOLN
INTRAVENOUS | Status: AC | PRN
  Administered 2015-07-11: 2000 mL via INTRAVENOUS

## 2015-07-11 MED ORDER — PROPOFOL 1000 MG/100ML IV EMUL: 5.0000 ug/kg/min | Freq: Once | INTRAVENOUS | Status: DC

## 2015-07-11 MED ORDER — MIDAZOLAM HCL 2 MG/2ML IJ SOLN
INTRAMUSCULAR | Status: AC
  Filled 2015-07-11: qty 6

## 2015-07-11 MED ORDER — INSULIN ASPART 100 UNIT/ML ~~LOC~~ SOLN
2.0000 [IU] | SUBCUTANEOUS | Status: DC
  Administered 2015-07-11 – 2015-07-12 (×4): 2 [IU] via SUBCUTANEOUS
  Administered 2015-07-12 (×2): 4 [IU] via SUBCUTANEOUS
  Administered 2015-07-12: 2 [IU] via SUBCUTANEOUS
  Administered 2015-07-13 (×4): 4 [IU] via SUBCUTANEOUS
  Administered 2015-07-14: 2 [IU] via SUBCUTANEOUS
  Administered 2015-07-14 (×2): 4 [IU] via SUBCUTANEOUS
  Administered 2015-07-14 – 2015-07-15 (×4): 2 [IU] via SUBCUTANEOUS
  Administered 2015-07-15: 4 [IU] via SUBCUTANEOUS
  Administered 2015-07-15: 2 [IU] via SUBCUTANEOUS

## 2015-07-11 MED ORDER — LORAZEPAM 2 MG/ML IJ SOLN
2.0000 mg | Freq: Once | INTRAMUSCULAR | Status: AC
  Administered 2015-07-11: 2 mg via INTRAVENOUS

## 2015-07-11 MED ORDER — FENTANYL BOLUS VIA INFUSION
25.0000 ug | INTRAVENOUS | Status: DC | PRN
  Filled 2015-07-11: qty 25

## 2015-07-11 MED ORDER — PANTOPRAZOLE SODIUM 40 MG IV SOLR
40.0000 mg | Freq: Every day | INTRAVENOUS | Status: DC
  Administered 2015-07-11 – 2015-07-12 (×2): 40 mg via INTRAVENOUS
  Filled 2015-07-11 (×3): qty 40

## 2015-07-11 MED ORDER — SODIUM CHLORIDE 0.9 % IV SOLN
500.0000 mg | Freq: Two times a day (BID) | INTRAVENOUS | Status: DC
  Administered 2015-07-12: 500 mg via INTRAVENOUS
  Filled 2015-07-11 (×2): qty 5

## 2015-07-11 MED ORDER — PROPOFOL 10 MG/ML IV BOLUS
INTRAVENOUS | Status: DC | PRN
  Administered 2015-07-11: 445 ug via INTRAVENOUS

## 2015-07-11 MED ORDER — MIDAZOLAM HCL 5 MG/5ML IJ SOLN
INTRAMUSCULAR | Status: DC | PRN
  Administered 2015-07-11: 5 mg via INTRAVENOUS

## 2015-07-11 NOTE — ED Provider Notes (Signed)
CSN: [id number]     Arrival date & time 06/29/2015  1917 History   First MD Initiated Contact with Patient 07/06/2015 1915     Found unresponsive, post cardiac arrest   (Consider location/radiation/quality/duration/timing/severity/associated sxs/prior Treatment) The history is provided by the EMS personnel, a relative and medical records. The history is limited by the condition of the patient. No language interpreter was used.   Patient is a 74 year old female with past medical history diabetes, coronary artery disease, on anticoagulation, hyperlipidemia who presents today after being found unresponsive and medially prior to arrival. Per report of EMS patient was last seen normal at approximately 12:00 this afternoon. Patient was discovered by her daughter to be unresponsive and EMS was subsequently called this evening. On arrival patient was notably in PA arrest. CPR was initiated immediately. Patient received multiple doses of epinephrine and return of spontaneous circulation was obtained. No shockable rhythms were noted at that point. Patient was notably breathing spontaneously although not protecting her airway. Given her cardiac arrest and inability to protect airway patient was intubated by EMS in route. Right IO was also placed for access as well as they were unable to obtain peripheral access. Currently the history is limited due to the patient's acute condition.    Past Medical History  Diagnosis Date  . Hyperplastic colon polyp   . Diabetes mellitus type II   . Osteoarthritis   . GERD (gastroesophageal reflux disease)   . Osteopenia   . Lupus     of skin see dermatologist frequently  . OA (osteoarthritis of spine)     C-spine  . DVT (deep venous thrombosis)     a. right leg DVT 05-17-2008. b. h/o leg swelling of left leg 2-10, u/s showed a old clot. c. another DVT reported 07/2011.  . 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 Allergy   .  Clotting disorder     hx of PE and DVT  . C. difficile diarrhea     hx of,   . Skin cancer     skin CA  . PONV (postoperative nausea and vomiting)   . Hypertension   . Breast cancer     left ductal breast ca dx 2003 approx, s/p XRt x 6 weeks, released from oncology (per  patient); dx right breast ca 2014 (in remission)  . Refusal of blood transfusions as patient is Jehovah's Witness   . CAD (coronary artery disease)     a. NSTEMI 07/2014: s/p BMS to mid-distal LAD.  . Ischemic cardiomyopathy     a. cath 07/22/2014 EF 35-40%; Echo 07/22/2013 EF 12-75%, grade 1 diastolic dysfunction, large area of apical akinesis.  Marland Kitchen HTN (hypertension)   . Dyslipidemia   . Transaminitis     a. Mild during 07/2014 admission ? due to MI  . Diabetes mellitus without complication   . Diastolic congestive heart failure    Past Surgical History  Procedure Laterality Date  . Cholecystectomy    . Breast biopsy      left breast and right breast  . Breast lumpectomy      left breast-ductal carcinoma in situ  . Appendectomy    . Skin cancer excision      from back   . Breast lumpectomy with needle localization and axillary sentinel lymph node bx Right 06/17/2013    Procedure: BREAST LUMPECTOMY WITH NEEDLE LOCALIZATION AND AXILLARY SENTINEL LYMPH NODE BX;  Surgeon: Merrie Roof, MD;  Location: [facility] OR;  Service: General;  Laterality: Right;  . Irrigation and debridement abscess Right 02/18/2014    Procedure: IRRIGATION AND DEBRIDEMENT RIGHT AXILLARY ABSCESS;  Surgeon: Leighton Ruff, MD;  Location: [facility];  Service: General;  Laterality: Right;  . Left heart cath N/A 07/22/2014    Procedure: LEFT HEART CATH;  Surgeon: Lorretta Harp, MD;  Location: [facility] CATH LAB;  Service: Cardiovascular;  Laterality: N/A;   Family History  Problem Relation Age of Onset  . Diabetes Mother   . Diabetes    . Heart attack Neg Hx   . Colon cancer Neg Hx   . Breast cancer Neg Hx   . Esophageal cancer Neg Hx   . Rectal cancer Neg Hx    . Stomach cancer Neg Hx   . Cancer Father     melanoma  . Diabetes Brother   . Other Brother     amputation   Social History  Substance Use Topics  . Smoking status: Never Smoker   . Smokeless tobacco: Never Used     Comment: Never  Used Tobacco  . Alcohol Use: No   OB History    No data available     Review of Systems  Unable to perform ROS: Acuity of condition      Allergies  Metformin; Pioglitazone; and Vancomycin  Home Medications   Prior to Admission medications   Medication Sig Start Date End Date Taking? Authorizing Provider  anastrozole (ARIMIDEX) 1 MG tablet TAKE 1 TABLET (1 MG TOTAL) BY MOUTH DAILY. 06/19/15   Volanda Napoleon, MD  B Complex-C (SUPER B COMPLEX PO) Take 1 tablet by mouth daily.    Historical Provider, MD  Calcium Carbonate-Vitamin D (CALCIUM-VITAMIN D) 600-200 MG-UNIT CAPS Take 1 tablet by mouth daily.     Historical Provider, MD  clopidogrel (PLAVIX) 75 MG tablet Take 1 tablet (75 mg total) by mouth daily. 09/05/14   Lelon Perla, MD  doxycycline (VIBRA-TABS) 100 MG tablet Take 1 tablet (100 mg total) by mouth 2 (two) times daily. 05/16/15   Debbrah Alar, NP  furosemide (LASIX) 20 MG tablet TAKE 1 TABLET (20 MG TOTAL) BY MOUTH DAILY AS NEEDED. 05/22/15   Debbrah Alar, NP  gabapentin (NEURONTIN) 100 MG capsule Take 1 capsule (100 mg total) by mouth at bedtime. 04/02/15   Florencia Reasons, MD  Glucosamine-Chondroitin (GLUCOSAMINE CHONDR COMPLEX PO) Take 1 capsule by mouth daily.    Historical Provider, MD  insulin glargine (LANTUS) 100 UNIT/ML injection Take 20 units in the morning and 40 units at bedtime. 04/02/15   Florencia Reasons, MD  insulin regular (NOVOLIN R,HUMULIN R) 100 units/mL injection Inject into the skin 3 (three) times daily before meals. Sliding scale 16-30  Units: Humulin    Historical Provider, MD  lisinopril (PRINIVIL,ZESTRIL) 20 MG tablet Take 1 tablet (20 mg total) by mouth daily. 06/26/15   Debbrah Alar, NP  metoprolol succinate  (TOPROL XL) 50 MG 24 hr tablet Take 1 tablet (50 mg total) by mouth daily. Take with or immediately following a meal. 05/07/15   Debbrah Alar, NP  nitroGLYCERIN (NITROSTAT) 0.4 MG SL tablet Place 1 tablet (0.4 mg total) under the tongue every 5 (five) minutes as needed for chest pain (up to 3 doses - if no relief, call 911). 07/25/14   Dayna N Dunn, PA-C  pravastatin (PRAVACHOL) 40 MG tablet TAKE 1 TABLET (40 MG TOTAL) BY MOUTH EVERY EVENING. FOR CHOLESTEROL AT NIGHT. 05/24/15   Lelon Perla, MD  Rivaroxaban (XARELTO) 15 MG TABS tablet Take 1 tablet (15 mg total) by mouth daily with supper. 10/02/14   Eliezer Bottom, NP  valACYclovir (VALTREX) 1000 MG tablet Take 2 tabs now and 2 tabs in 12 hours 05/07/15   Debbrah Alar, NP   BP 103/50 mmHg  Pulse 87  Temp(Src) 100.6 F (38.1 C) (Oral)  Resp 24  Ht 5\' 6"  (1.676 m)  Wt 208 lb 1.8 oz (94.4 kg)  BMI 33.61 kg/m2  SpO2 92% Physical Exam  Constitutional: She appears distressed. She is intubated.  HENT:  Head: Normocephalic and atraumatic.  Eyes: Conjunctivae and EOM are normal. Right pupil is round and reactive. Left pupil is round and reactive. Pupils are equal.  Neck: Normal range of motion. Neck supple.  Cardiovascular: Normal rate and regular rhythm.   Pulmonary/Chest: Bradypnea noted. She is intubated. She has no wheezes. She has rales (coarse and diffuse).  Abdominal: Soft. She exhibits no distension.  Musculoskeletal: Normal range of motion. She exhibits no edema.  Neurological: She is unresponsive. GCS eye subscore is 1. GCS verbal subscore is 1. GCS motor subscore is 1.  Skin: Skin is dry. She is not diaphoretic. There is pallor.  Nursing note and vitals reviewed.   ED Course  Procedures (including critical care time) Labs Review Labs Reviewed  LACTIC ACID, PLASMA - Abnormal; Notable for the following:    Lactic Acid, Venous 8.8 (*)    All other components within normal limits  TROPONIN I - Abnormal; Notable for  the following:    Troponin I 0.04 (*)    All other components within normal limits  COMPREHENSIVE METABOLIC PANEL - Abnormal; Notable for the following:    CO2 12 (*)    Glucose, Bld 223 (*)    Creatinine, Ser 1.19 (*)    Calcium 8.6 (*)    Albumin 3.2 (*)    AST 555 (*)    ALT 522 (*)    GFR calc non Af Amer 44 (*)    GFR calc Af Amer 50 (*)    Anion gap 22 (*)    All other components within normal limits  CBC WITH DIFFERENTIAL/PLATELET - Abnormal; Notable for the following:    WBC 14.9 (*)    Neutro Abs 11.5 (*)    All other components within normal limits  URINALYSIS, ROUTINE W REFLEX MICROSCOPIC (NOT AT [facility]) - Abnormal; Notable for the following:    Glucose, UA 250 (*)    Hgb urine dipstick LARGE (*)    Protein, ur 100 (*)    All other components within normal limits  GLUCOSE, CAPILLARY - Abnormal; Notable for the following:    Glucose-Capillary 130 (*)    All other components within normal limits  POCT I-STAT 3, ART BLOOD GAS (G3+) - Abnormal; Notable for the following:    pH, Arterial 7.177 (*)    pCO2 arterial 51.2 (*)    pO2, Arterial >450 (*)    Bicarbonate 18.7 (*)    Acid-base deficit 9.0 (*)    All other components within normal limits  CULTURE, BLOOD (ROUTINE X 2)  CULTURE, BLOOD (ROUTINE X 2)  URINE CULTURE  CULTURE, RESPIRATORY (NON-EXPECTORATED)  MRSA PCR SCREENING  URINE MICROSCOPIC-ADD ON  MAGNESIUM  PHOSPHORUS  PROTIME-INR  BASIC METABOLIC PANEL  BLOOD GAS, ARTERIAL  MAGNESIUM  PHOSPHORUS  TROPONIN I  TROPONIN I  TROPONIN I  HEPARIN LEVEL (UNFRACTIONATED)  CBC  BLOOD GAS, ARTERIAL  APTT  LACTIC ACID, PLASMA  LACTIC ACID,  PLASMA  TRIGLYCERIDES    Imaging Review Ct Head Wo Contrast  07/18/2015   CLINICAL DATA:  Found on the floor unresponsive. Stated history of coma. Post cardiac arrest.  EXAM: CT HEAD WITHOUT CONTRAST  TECHNIQUE: Contiguous axial images were obtained from the base of the skull through the vertex without intravenous  contrast.  COMPARISON:  03/29/2015  FINDINGS: Ill-defined cortical hypodensity in the anterior right temporal lobe, with questionable small focus of cortical hypodensity in the left temporal lobe, may reflect subacute ischemia. No significant mass effect or midline shift. No intracranial hemorrhage. No cerebral edema. Atrophy, chronic small vessel ischemia, and remote lacunar infarct in the right caudate, unchanged. No hydrocephalus. The basilar cisterns are patent. No intracranial fluid collection. Calvarium is intact. Minimal mucosal thickening of left side of sphenoid sinus, remaining paranasal sinuses and mastoid air cells are well aerated.  IMPRESSION: 1. Findings suspicious for acute/subacute ischemia involving the anterior right temporal lobe, and possibly left temporal lobe. 2. No intracranial hemorrhage or findings of diffuse cerebral edema. These results were called by telephone at the time of interpretation on 07/06/2015 at 45:80 pm to Dr. Roxanne Mins , who verbally acknowledged these results.   Electronically Signed   By: Jeb Levering M.D.   On: 07/13/2015 22:29   Dg Chest Port 1 View  06/21/2015   CLINICAL DATA:  Status post intubation.  EXAM: PORTABLE CHEST - 1 VIEW  COMPARISON:  03/29/2015  FINDINGS: The endotracheal tube tip is in the right mainstem bronchus. Recommend withdrawing by 1-1/2 to 2 cm. Normal heart size. Asymmetric opacity within the right upper lobe is noted, nonspecific. No pleural effusion or edema.  IMPRESSION: 1. ET tube tip is in the right mainstem bronchus. Recommend withdrawal by 1-1/2 to 2 cm. 2. Right upper lobe airspace opacity. 3. These results were called by telephone at the time of interpretation on 07/14/2015 at 8:03 pm to Dr. Delora Fuel , who verbally acknowledged these results.   Electronically Signed   By: Kerby Moors M.D.   On: 06/20/2015 20:03   I have personally reviewed and evaluated these images and lab results as part of my medical decision-making.   EKG  Interpretation   Date/Time:  Wednesday July 11 2015 19:17:45 EDT Ventricular Rate:  91 PR Interval:  169 QRS Duration: 104 QT Interval:  440 QTC Calculation: 541 R Axis:   69 Text Interpretation:  Sinus rhythm Prolonged QT interval When compared  with ECG of 03/29/2015, QT has lengthened Confirmed by Roxanne Mins  MD, DAVID  ([id number]) on 07/03/2015 7:56:48 PM      MDM   Final diagnoses:  Cardiac arrest    Patient presents today status post cardiac arrest resuscitation. Decerebrate posturing was appreciated on exam. Patient's blood pressure was stable within an acceptable limit. She was intubated and pupils were equal and reactive although extremely sluggish. No clear etiology although cardiac is high on the differential in addition to pulmonary embolism. Discussed with the vehicle care medicine team to limit the patient for further evaluation and management. Patient was in critical condition at the time of transfer of care to the ICU. Family is aware of the patient's critical condition and is at bedside.    Theodosia Quay, MD 82/50/53 [id number]  Delora Fuel, MD 34/19/37 [id number]

## 2015-07-11 NOTE — ED Notes (Signed)
Per ems-- pt had unwitnessed arrest at home. Last seen 12 noon today. Pt in PEA upon ems arrival- CPR initiated x 24 mins and again for 4 mins. 5 epis, narcan, 5mg  versed and D50 given PTA. Per family pt had seizure yesterday and again today.

## 2015-07-11 NOTE — Code Documentation (Signed)
Critical care at bedside  

## 2015-07-11 NOTE — Consult Note (Addendum)
Admit date: 06/29/2015 Referring Physician  Dr. Roxanne Mins Primary Physician Nance Pear., NP Primary Cardiologist  Dr. Stanford Breed Reason for Consultation  PEA arrest  HPI: 74 year old female with coronary artery disease post LAD stent with now normal ejection fraction on last echocardiogram in 2015 with comorbidities of lupus, history of PE and DVT with chronic anticoagulation managed by hematology, diabetes, hyperlipidemia who earlier today was found by her daughter on the floor unresponsive. Per gathered history, there was perhaps 20 minutes of CPR at home and EMS did not perform any defibrillations. PEA arrest. Epinephrine was administered. Finally, pulse was obtained per EMS. She was intubated in the field.  Here in the emergency room, she is showing some spontaneous movements, some posturing activity. She may have some spontaneous breathing.  Per gathered history she may have had what sounds like seizure-like activity yesterday and earlier this afternoon per her grandson.    PMH:   Past Medical History  Diagnosis Date  . Hyperplastic colon polyp   . Diabetes mellitus type II   . Osteoarthritis   . GERD (gastroesophageal reflux disease)   . Osteopenia   . Lupus     of skin see dermatologist frequently  . OA (osteoarthritis of spine)     C-spine  . DVT (deep venous thrombosis)     a. right leg DVT 05-17-2008. b. h/o leg swelling of left leg 2-10, u/s showed a old clot. c. another DVT reported 07/2011.  . 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 Allergy   . Clotting disorder     hx of PE and DVT  . C. difficile diarrhea     hx of,   . Skin cancer     skin CA  . PONV (postoperative nausea and vomiting)   . Hypertension   . Breast cancer     left ductal breast ca dx 2003 approx, s/p XRt x 6 weeks, released from oncology (per  patient); dx right breast ca 2014 (in remission)  . Refusal of blood transfusions as patient is Jehovah's  Witness   . CAD (coronary artery disease)     a. NSTEMI 07/2014: s/p BMS to mid-distal LAD.  . Ischemic cardiomyopathy     a. cath 07/22/2014 EF 35-40%; Echo 07/22/2013 EF 76-72%, grade 1 diastolic dysfunction, large area of apical akinesis.  Marland Kitchen HTN (hypertension)   . Dyslipidemia   . Transaminitis     a. Mild during 07/2014 admission ? due to MI  . Diabetes mellitus without complication   . Diastolic congestive heart failure     PSH:   Past Surgical History  Procedure Laterality Date  . Cholecystectomy    . Breast biopsy      left breast and right breast  . Breast lumpectomy      left breast-ductal carcinoma in situ  . Appendectomy    . Skin cancer excision      from back   . Breast lumpectomy with needle localization and axillary sentinel lymph node bx Right 06/17/2013    Procedure: BREAST LUMPECTOMY WITH NEEDLE LOCALIZATION AND AXILLARY SENTINEL LYMPH NODE BX;  Surgeon: Merrie Roof, MD;  Location: [facility];  Service: General;  Laterality: Right;  . Irrigation and debridement abscess Right 02/18/2014    Procedure: IRRIGATION AND DEBRIDEMENT RIGHT AXILLARY ABSCESS;  Surgeon: Leighton Ruff, MD;  Location: [facility] ORS;  Service: General;  Laterality: Right;  . Left heart cath N/A 07/22/2014    Procedure:  LEFT HEART CATH;  Surgeon: Lorretta Harp, MD;  Location: [facility] CATH LAB;  Service: Cardiovascular;  Laterality: N/A;   Allergies:  Metformin; Pioglitazone; and Vancomycin Prior to Admit Meds:   Prior to Admission medications   Medication Sig Start Date End Date Taking? Authorizing Provider  anastrozole (ARIMIDEX) 1 MG tablet TAKE 1 TABLET (1 MG TOTAL) BY MOUTH DAILY. 06/19/15   Volanda Napoleon, MD  B Complex-C (SUPER B COMPLEX PO) Take 1 tablet by mouth daily.    Historical Provider, MD  Calcium Carbonate-Vitamin D (CALCIUM-VITAMIN D) 600-200 MG-UNIT CAPS Take 1 tablet by mouth daily.     Historical Provider, MD  clopidogrel (PLAVIX) 75 MG tablet Take 1 tablet (75 mg total) by mouth daily. 09/05/14    Lelon Perla, MD  doxycycline (VIBRA-TABS) 100 MG tablet Take 1 tablet (100 mg total) by mouth 2 (two) times daily. 05/16/15   Debbrah Alar, NP  furosemide (LASIX) 20 MG tablet TAKE 1 TABLET (20 MG TOTAL) BY MOUTH DAILY AS NEEDED. 05/22/15   Debbrah Alar, NP  gabapentin (NEURONTIN) 100 MG capsule Take 1 capsule (100 mg total) by mouth at bedtime. 04/02/15   Florencia Reasons, MD  Glucosamine-Chondroitin (GLUCOSAMINE CHONDR COMPLEX PO) Take 1 capsule by mouth daily.    Historical Provider, MD  insulin glargine (LANTUS) 100 UNIT/ML injection Take 20 units in the morning and 40 units at bedtime. 04/02/15   Florencia Reasons, MD  insulin regular (NOVOLIN R,HUMULIN R) 100 units/mL injection Inject into the skin 3 (three) times daily before meals. Sliding scale 16-30  Units: Humulin    Historical Provider, MD  lisinopril (PRINIVIL,ZESTRIL) 20 MG tablet Take 1 tablet (20 mg total) by mouth daily. 06/26/15   Debbrah Alar, NP  metoprolol succinate (TOPROL XL) 50 MG 24 hr tablet Take 1 tablet (50 mg total) by mouth daily. Take with or immediately following a meal. 05/07/15   Debbrah Alar, NP  nitroGLYCERIN (NITROSTAT) 0.4 MG SL tablet Place 1 tablet (0.4 mg total) under the tongue every 5 (five) minutes as needed for chest pain (up to 3 doses - if no relief, call 911). 07/25/14   Dayna N Dunn, PA-C  pravastatin (PRAVACHOL) 40 MG tablet TAKE 1 TABLET (40 MG TOTAL) BY MOUTH EVERY EVENING. FOR CHOLESTEROL AT NIGHT. 05/24/15   Lelon Perla, MD  Rivaroxaban (XARELTO) 15 MG TABS tablet Take 1 tablet (15 mg total) by mouth daily with supper. 10/02/14   Eliezer Bottom, NP  valACYclovir (VALTREX) 1000 MG tablet Take 2 tabs now and 2 tabs in 12 hours 05/07/15   Debbrah Alar, NP   Fam HX:    Family History  Problem Relation Age of Onset  . Diabetes Mother   . Diabetes    . Heart attack Neg Hx   . Colon cancer Neg Hx   . Breast cancer Neg Hx   . Esophageal cancer Neg Hx   . Rectal cancer Neg Hx   .  Stomach cancer Neg Hx   . Cancer Father     melanoma  . Diabetes Brother   . Other Brother     amputation   Social HX:    Social History   Social History  . Marital Status: Widowed    Spouse Name: N/A  . Number of Children: 3  . Years of Education: N/A   Occupational History  . Retired    Social History Main Topics  . Smoking status: Never Smoker   . Smokeless tobacco: Never Used  Comment: Never  Used Tobacco  . Alcohol Use: No  . Drug Use: No  . Sexual Activity: Not Currently   Other Topics Concern  . Not on file   Social History Narrative   Single-widow    3 children , 1 in [place] (son, daughter - [place])    [place] to [place] 2002 from [place], [place]   Alcohol Use - no      tobacco-- never     Illicit Drug Use - no    Pt is Jehovah's Witness-No blood products   Daughter - Shereda Moore            ROS:  Unable, obtunded   Physical Exam: Blood pressure 130/98, pulse 91, temperature 95.2 F (35.1 C), temperature source Temporal, SpO2 98 %.   General: Well developed, ill appearing on ventilator, somewhat posturing Head: No specific pupillary response. Normal cephalic and atramatic  Lungs:   Minimal crackles heard, vent noise. Heart:   HRRR S1 S2 Pulses are 2+ & equal. No murmur, rubs, gallops.  No carotid bruit. No JVD.  No abdominal bruits.  Abdomen: Bowel sounds are positive, abdomen soft and non-tender without masses. No hepatosplenomegaly. Overweight Msk:  Unable to assess strength and tone for age. Extremities:  No clubbing, cyanosis. Minimal lower extremity edema.  Hyperpigmentation noted. DP +1 Neuro: Obtunded, reading somewhat spontaneously, posturing GU: Deferred Rectal: Deferred Psych:  Unable to assess      Labs: Lab Results  Component Value Date   WBC 5.3 04/02/2015   HGB 11.2* 04/02/2015   HCT 34.8* 04/02/2015   MCV 84.3 04/02/2015   PLT 165 04/02/2015    No results for input(s): NA, K, CL, CO2, BUN, CREATININE, CALCIUM, PROT,  BILITOT, ALKPHOS, ALT, AST, GLUCOSE in the last 168 hours.  Invalid input(s): LABALBU No results for input(s): CKTOTAL, CKMB, TROPONINI in the last 72 hours. Lab Results  Component Value Date   CHOL 114 09/06/2014   HDL 32* 09/06/2014   LDLCALC 60 09/06/2014   TRIG 112 09/06/2014   Lab Results  Component Value Date   DDIMER 0.43 02/19/2015     Radiology:  Dg Chest Port 1 View  07/13/2015   CLINICAL DATA:  Status post intubation.  EXAM: PORTABLE CHEST - 1 VIEW  COMPARISON:  03/29/2015  FINDINGS: The endotracheal tube tip is in the right mainstem bronchus. Recommend withdrawing by 1-1/2 to 2 cm. Normal heart size. Asymmetric opacity within the right upper lobe is noted, nonspecific. No pleural effusion or edema.  IMPRESSION: 1. ET tube tip is in the right mainstem bronchus. Recommend withdrawal by 1-1/2 to 2 cm. 2. Right upper lobe airspace opacity. 3. These results were called by telephone at the time of interpretation on 07/10/2015 at 8:03 pm to Dr. Delora Fuel , who verbally acknowledged these results.   Electronically Signed   By: Kerby Moors M.D.   On: 06/27/2015 20:03   Personally viewed.  EKG:  Sinus rhythm, nonspecific ST-T wave changes, no ST segment elevation, QT interval 440 ms with QTC of 540 Personally viewed. QT has prolonged with compared to prior.  ASSESSMENT/PLAN:    74 year old female post unwitnessed PEA arrest, CPR greater than 20 minutes with underlying coronary artery disease status post mid to distal LAD stent with most recent ejection fraction normal.  1. PEA arrest  - Current supportive care through critical care medicine (pressor agents if necessary).  - Unable to determine whether this was a primary cardiac event. No evidence of ST elevation  on EKG.  - We will check 2-D echocardiogram.  - Obviously at this point, she is in grave/critical condition.  - Awaiting lactic acid  2. Coronary artery disease  - I reviewed 03/21/15 office note from Dr. Stanford Breed where  he described her admission in September 2015 with myocardial infarction, 95% mid to distal LAD noted with otherwise normal coronary arteries. Ejection fraction 35-40%. Most recent ejection fraction was normal.  - We will try to utilize beta blocker/ACE inhibitor when capable. Also will try to utilize statin once again when capable.  3. History of DVT and pulmonary embolism  - On chronic anticoagulation. Xarelto.  4. Prolonged QT  - This is increased from previous EKG. Note, she was not in ventricular tachycardia when found. Continue to try to maintain potassium greater than 4, magnesium greater than 2. She has had no prior history of polymorphic VT. Current QT interval could also be exacerbated by her current metabolic state.  We will follow along.  Candee Furbish, MD  06/26/2015  8:08 PM

## 2015-07-11 NOTE — Progress Notes (Signed)
RT attempted to place arterial line two times with no success. RN aware.

## 2015-07-11 NOTE — ED Provider Notes (Signed)
74 year old female is brought in by EMS postcardiac arrest. Her daughter came home and found her on the floor unresponsive and fire rescue started CPR. There is no estimated at 20 minutes or more of CPR at home. EMS, on arrival noted pulseless electrical activity. She was given epinephrine through an intraosseous line and did have return of spontaneous circulation. She briefly lost pulses again requiring reinitiation of CPR but she responded to additional epinephrine. She had a long transport time to the hospital and required no additional epinephrine. She has had an steady rhythm with strong pulses since then. She was intubated by EMS. In the ED, she is noted to have some spontaneous respirations but no spontaneous motor activity. Lungs do have some slight wheezing. Extremities have 1+ edema with venous stasis changes present. She had no other neurologic activity except as noted above. ECG showed no evidence of STEMI. Critical care medicine has been consulted regarding possible initiation of therapeutic hypothermia. Dr. Alva Garnet states that one of their practitioners will be coming to the ED. Case is also discussed with Rolin Barry PA-C of cardiology service who will be coming to see the patient.  I began to talk with the family. Daughter states that she found her mother unresponsive and was told by fire rescue that she was dead. A grandson is there relating that patient had what sounds like seizure activity yesterday and earlier this afternoon. I reviewed her old records and she had a heart attack about one year ago with placement of a stent in the LAD and no other significant coronary disease at that time. I went back to reevaluate the patient and she is now showing decerebrate posturing. She is also fighting her endotracheal tube and is requiring sedation with propofol.  CRITICAL CARE Performed by: Delora Fuel Total critical care time: 100 minutes Critical care time was exclusive of separately billable  procedures and treating other patients. Critical care was necessary to treat or prevent imminent or life-threatening deterioration. Critical care was time spent personally by me on the following activities: development of treatment plan with patient and/or surrogate as well as nursing, discussions with consultants, evaluation of patient's response to treatment, examination of patient, obtaining history from patient or surrogate, ordering and performing treatments and interventions, ordering and review of laboratory studies, ordering and review of radiographic studies, pulse oximetry and re-evaluation of patient's condition.  I saw and evaluated the patient, reviewed the resident's note and I agree with the findings and plan.   EKG Interpretation   Date/Time:  Wednesday July 11 2015 19:17:45 EDT Ventricular Rate:  91 PR Interval:  169 QRS Duration: 104 QT Interval:  440 QTC Calculation: 541 R Axis:   69 Text Interpretation:  Sinus rhythm Prolonged QT interval When compared  with ECG of 03/29/2015, QT has lengthened Confirmed by Roxanne Mins  MD, Imanii Gosdin  ([id number]) on 07/10/2015 7:56:48 PM        Delora Fuel, MD 09/32/35 [id number]

## 2015-07-11 NOTE — Consult Note (Signed)
Consult Reason for Consult:cardiac arrest Referring Physician: Dr Vaughan Browner CCM  CC: cardiac arrest  HPI: [name] is an 74 y.o. female hx of DM, CAD, DVT/PE on xarelto, SLE, breast CA, seizures presenting after cardiac arrest. LSW 12 noon on 8/24. Family returned at 1830 to find her unresponsive. Upon EMS arrival noted to be in PEA, 20 minutes CPR prior to [place] though unclear total down time. Per EMS, initially completely unresponsive though in ED noted to have some posturing. Family raises possibility of seizure activity the day prior to cardiac arrest though history is unclear.  Labs pertinent for lactic acid 8.8, WBC 14.9. CT head imaging pending.  Past Medical History  Diagnosis Date  . Hyperplastic colon polyp   . Diabetes mellitus type II   . Osteoarthritis   . GERD (gastroesophageal reflux disease)   . Osteopenia   . Lupus     of skin see dermatologist frequently  . OA (osteoarthritis of spine)     C-spine  . DVT (deep venous thrombosis)     a. right leg DVT 05-17-2008. b. h/o leg swelling of left leg 2-10, u/s showed a old clot. c. another DVT reported 07/2011.  . 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 Allergy   . Clotting disorder     hx of PE and DVT  . C. difficile diarrhea     hx of,   . Skin cancer     skin CA  . PONV (postoperative nausea and vomiting)   . Hypertension   . Breast cancer     left ductal breast ca dx 2003 approx, s/p XRt x 6 weeks, released from oncology (per  patient); dx right breast ca 2014 (in remission)  . Refusal of blood transfusions as patient is Jehovah's Witness   . CAD (coronary artery disease)     a. NSTEMI 07/2014: s/p BMS to mid-distal LAD.  . Ischemic cardiomyopathy     a. cath 07/22/2014 EF 35-40%; Echo 07/22/2013 EF 09-73%, grade 1 diastolic dysfunction, large area of apical akinesis.  Marland Kitchen HTN (hypertension)   . Dyslipidemia   . Transaminitis     a. Mild during 07/2014 admission ? due to  MI  . Diabetes mellitus without complication   . Diastolic congestive heart failure     Past Surgical History  Procedure Laterality Date  . Cholecystectomy    . Breast biopsy      left breast and right breast  . Breast lumpectomy      left breast-ductal carcinoma in situ  . Appendectomy    . Skin cancer excision      from back   . Breast lumpectomy with needle localization and axillary sentinel lymph node bx Right 06/17/2013    Procedure: BREAST LUMPECTOMY WITH NEEDLE LOCALIZATION AND AXILLARY SENTINEL LYMPH NODE BX;  Surgeon: Merrie Roof, MD;  Location: [facility];  Service: General;  Laterality: Right;  . Irrigation and debridement abscess Right 02/18/2014    Procedure: IRRIGATION AND DEBRIDEMENT RIGHT AXILLARY ABSCESS;  Surgeon: Leighton Ruff, MD;  Location: [facility];  Service: General;  Laterality: Right;  . Left heart cath N/A 07/22/2014    Procedure: LEFT HEART CATH;  Surgeon: Lorretta Harp, MD;  Location: [facility] C[facility];  Service: Cardiovascular;  Laterality: N/A;    Family History  Problem Relation Age of Onset  . Diabetes Mother   . Diabetes    . Heart attack Neg Hx   .  Colon cancer Neg Hx   . Breast cancer Neg Hx   . Esophageal cancer Neg Hx   . Rectal cancer Neg Hx   . Stomach cancer Neg Hx   . Cancer Father     melanoma  . Diabetes Brother   . Other Brother     amputation    Social History:  reports that she has never smoked. She has never used smokeless tobacco. She reports that she does not drink alcohol or use illicit drugs.  Allergies  Allergen Reactions  . Metformin Diarrhea  . Pioglitazone Other (See Comments)    weight gain  . Vancomycin Rash    Erythematous rash, hands and toes became cyanotic.      Medications:  Scheduled: . fentaNYL (SUBLIMAZE) injection  50 mcg Intravenous Once  . [START ON 07/12/2015] insulin aspart  2-6 Units Subcutaneous 6 times per day  . pantoprazole (PROTONIX) IV  40 mg Intravenous QHS    ROS: Out of a complete 14 system  review, the patient complains of only the following symptoms, and all other reviewed systems are negative. Unable to obtain  Physical Examination: Filed Vitals:   06/27/2015 2145  BP: 115/76  Pulse: 88  Temp: 99.9 F (37.7 C)  Resp: 29   Physical Exam  Constitutional: He appears well-developed and well-nourished.  Psych: Affect appropriate to situation Eyes: No scleral injection HENT: No OP obstrucion Head: Normocephalic.  Cardiovascular: Normal rate and regular rhythm.  Respiratory: Effort normal and breath sounds normal.  GI: Soft. Bowel sounds are normal. No distension. There is no tenderness.  Skin: WDI  Neurologic Examination Patient does not respond to verbal stimuli.  Does not respond to deep sternal rub.  Does not follow commands.  No verbalizations noted.  Cranial Nerves: II: patient does not respond confrontation bilaterally, pupils right 4 mm, left 4 mm,and reactive bilaterally III,IV,VI: doll's response + bilaterally.  V,VII: corneal reflex absentbilaterally  VIII: patient does not respond to verbal stimuli IX,X: gag reflex absent, XI: trapezius strength unable to test bilaterally XII: tongue strength unable to test Motor: Extremities flaccid throughout.  No spontaneous movement noted.  No purposeful movements noted. Sensory: Does not respond to noxious stimuli in any extremity. Deep Tendon Reflexes:  Absent throughout. Plantars: equivocal bilaterally Cerebellar: Unable to perform   Laboratory Studies:   Basic Metabolic Panel:  Recent Labs Lab 06/25/2015 1923  NA 139  K 3.8  CL 105  CO2 12*  GLUCOSE 223*  BUN 18  CREATININE 1.19*  CALCIUM 8.6*    Liver Function Tests:  Recent Labs Lab 06/20/2015 1923  AST 555*  ALT 522*  ALKPHOS 107  BILITOT 0.8  PROT 6.6  ALBUMIN 3.2*   No results for input(s): LIPASE, AMYLASE in the last 168 hours. No results for input(s): AMMONIA in the last 168 hours.  CBC:  Recent Labs Lab 07/07/2015 1923   WBC 14.9*  NEUTROABS 11.5*  HGB 12.5  HCT 39.1  MCV 87.9  PLT 188    Cardiac Enzymes:  Recent Labs Lab 07/14/2015 1923  TROPONINI 0.04*    BNP: Invalid input(s): POCBNP  CBG: No results for input(s): GLUCAP in the last 168 hours.  Microbiology: Results for orders placed or performed in visit on 05/07/15  Wound culture     Status: None   Collection Time: 05/07/15  2:46 PM  Result Value Ref Range Status   Gram Stain No WBC Seen  Final   Gram Stain No Squamous Epithelial Cells Seen  Final  Gram Stain No Organisms Seen  Final   Organism ID, Bacteria Multiple Organisms Present,None Predominant  Final    Comment: No Staphylococcus aureus isolated NO GROUP A STREP (S. PYOGENES) ISOLATED    *Note: Due to a large number of results and/or encounters for the requested time period, some results have not been displayed. A complete set of results can be found in Results Review.    Coagulation Studies: No results for input(s): LABPROT, INR in the last 72 hours.  Urinalysis:  Recent Labs Lab 07/17/2015 1941  COLORURINE YELLOW  LABSPEC 1.009  PHURINE 6.5  GLUCOSEU 250*  HGBUR LARGE*  BILIRUBINUR NEGATIVE  KETONESUR NEGATIVE  PROTEINUR 100*  UROBILINOGEN 0.2  NITRITE NEGATIVE  LEUKOCYTESUR NEGATIVE    Lipid Panel:     Component Value Date/Time   CHOL 114 09/06/2014 0829   TRIG 112 09/06/2014 0829   TRIG 207 12/21/2009 0000   HDL 32* 09/06/2014 0829   CHOLHDL 3.6 09/06/2014 0829   VLDL 22 09/06/2014 0829   LDLCALC 60 09/06/2014 0829    HgbA1C:  Lab Results  Component Value Date   HGBA1C 11.3* 03/30/2015    Urine Drug Screen:  No results found for: LABOPIA, COCAINSCRNUR, LABBENZ, AMPHETMU, THCU, LABBARB  Alcohol Level: No results for input(s): ETH in the last 168 hours.   Imaging: Dg Chest Port 1 View  06/21/2015   CLINICAL DATA:  Status post intubation.  EXAM: PORTABLE CHEST - 1 VIEW  COMPARISON:  03/29/2015  FINDINGS: The endotracheal tube tip is in the  right mainstem bronchus. Recommend withdrawing by 1-1/2 to 2 cm. Normal heart size. Asymmetric opacity within the right upper lobe is noted, nonspecific. No pleural effusion or edema.  IMPRESSION: 1. ET tube tip is in the right mainstem bronchus. Recommend withdrawal by 1-1/2 to 2 cm. 2. Right upper lobe airspace opacity. 3. These results were called by telephone at the time of interpretation on 06/28/2015 at 8:03 pm to Dr. Delora Fuel , who verbally acknowledged these results.   Electronically Signed   By: Kerby Moors M.D.   On: 06/27/2015 20:03     Assessment/Plan:  74y/o woman hx of DM, CAD, DVT/PE on xarelto, seizures presenting with cardiac arrest. 20 minutes CPR before ROSC though true down time is unclear. Not a candidate for hypothermia protocol due to prolonged down time. Concern for hypoxic injury. Guarded prognosis at this time.    -EEG brain -CT head pending -will follow  Jim Like, DO [facility] [phone]  If 7pm- 7am, please page neurology on call as listed in [place]. 07/14/2015, 10:03 PM

## 2015-07-11 NOTE — Code Documentation (Signed)
Family at bedside- daughter given all pt clothing and earrings.

## 2015-07-11 NOTE — Progress Notes (Signed)
   07/10/2015 2000  Clinical Encounter Type  Visited With Family;Health care provider  Visit Type ED;Critical Care;Code  Referral From Nurse  Consult/Referral To Chaplain  Spiritual Encounters  Spiritual Needs Emotional;Grief support  [facility] responded to page; pt post-cpr; [place] moved family to consult room to meet with MD; Family noted the pt is Jehovah Witness; emotional and spiritual support provided.

## 2015-07-11 NOTE — Progress Notes (Addendum)
ANTICOAGULATION CONSULT NOTE - Initial Consult  Pharmacy Consult for Heparin Indication: pulmonary embolus  Allergies  Allergen Reactions  . Metformin Diarrhea  . Pioglitazone Other (See Comments)    weight gain  . Vancomycin Rash    Erythematous rash, hands and toes became cyanotic.      Patient Measurements: Weight: 196 lb 3.4 oz (89 kg) Heparin Dosing Weight: 79 kg  Vital Signs: Temp: 99.9 F (37.7 C) (08/24 2145) Temp Source: Temporal (08/24 1920) BP: 115/76 mmHg (08/24 2145) Pulse Rate: 88 (08/24 2145)  Labs:  Recent Labs  07/17/2015 1923  HGB 12.5  HCT 39.1  PLT 188  CREATININE 1.19*  TROPONINI 0.04*    Estimated Creatinine Clearance: 45.9 mL/min (by C-G formula based on Cr of 1.19).   Medical History: Past Medical History  Diagnosis Date  . Hyperplastic colon polyp   . Diabetes mellitus type II   . Osteoarthritis   . GERD (gastroesophageal reflux disease)   . Osteopenia   . Lupus     of skin see dermatologist frequently  . OA (osteoarthritis of spine)     C-spine  . DVT (deep venous thrombosis)     a. right leg DVT 05-17-2008. b. h/o leg swelling of left leg 2-10, u/s showed a old clot. c. another DVT reported 07/2011.  . 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 Allergy   . Clotting disorder     hx of PE and DVT  . C. difficile diarrhea     hx of,   . Skin cancer     skin CA  . PONV (postoperative nausea and vomiting)   . Hypertension   . Breast cancer     left ductal breast ca dx 2003 approx, s/p XRt x 6 weeks, released from oncology (per  patient); dx right breast ca 2014 (in remission)  . Refusal of blood transfusions as patient is Jehovah's Witness   . CAD (coronary artery disease)     a. NSTEMI 07/2014: s/p BMS to mid-distal LAD.  . Ischemic cardiomyopathy     a. cath 07/22/2014 EF 35-40%; Echo 07/22/2013 EF 93-79%, grade 1 diastolic dysfunction, large area of apical akinesis.  Marland Kitchen HTN (hypertension)    . Dyslipidemia   . Transaminitis     a. Mild during 07/2014 admission ? due to MI  . Diabetes mellitus without complication   . Diastolic congestive heart failure     Medications:  Prescriptions prior to admission  Medication Sig Dispense Refill Last Dose  . anastrozole (ARIMIDEX) 1 MG tablet TAKE 1 TABLET (1 MG TOTAL) BY MOUTH DAILY. 30 tablet 5   . B Complex-C (SUPER B COMPLEX PO) Take 1 tablet by mouth daily.   Taking  . Calcium Carbonate-Vitamin D (CALCIUM-VITAMIN D) 600-200 MG-UNIT CAPS Take 1 tablet by mouth daily.    Taking  . clopidogrel (PLAVIX) 75 MG tablet Take 1 tablet (75 mg total) by mouth daily. 30 tablet 12 Taking  . doxycycline (VIBRA-TABS) 100 MG tablet Take 1 tablet (100 mg total) by mouth 2 (two) times daily. 20 tablet 0   . furosemide (LASIX) 20 MG tablet TAKE 1 TABLET (20 MG TOTAL) BY MOUTH DAILY AS NEEDED. 30 tablet 3   . gabapentin (NEURONTIN) 100 MG capsule Take 1 capsule (100 mg total) by mouth at bedtime. 30 capsule 0 Taking  . Glucosamine-Chondroitin (GLUCOSAMINE CHONDR COMPLEX PO) Take 1 capsule by mouth daily.   Taking  . insulin glargine (LANTUS)  100 UNIT/ML injection Take 20 units in the morning and 40 units at bedtime. 10 mL 11 Taking  . insulin regular (NOVOLIN R,HUMULIN R) 100 units/mL injection Inject into the skin 3 (three) times daily before meals. Sliding scale 16-30  Units: Humulin   Taking  . lisinopril (PRINIVIL,ZESTRIL) 20 MG tablet Take 1 tablet (20 mg total) by mouth daily. 30 tablet 2   . metoprolol succinate (TOPROL XL) 50 MG 24 hr tablet Take 1 tablet (50 mg total) by mouth daily. Take with or immediately following a meal.   Taking  . nitroGLYCERIN (NITROSTAT) 0.4 MG SL tablet Place 1 tablet (0.4 mg total) under the tongue every 5 (five) minutes as needed for chest pain (up to 3 doses - if no relief, call 911). 25 tablet 3 Taking  . pravastatin (PRAVACHOL) 40 MG tablet TAKE 1 TABLET (40 MG TOTAL) BY MOUTH EVERY EVENING. FOR CHOLESTEROL AT NIGHT.  90 tablet 2   . Rivaroxaban (XARELTO) 15 MG TABS tablet Take 1 tablet (15 mg total) by mouth daily with supper. 30 tablet 11 Taking  . valACYclovir (VALTREX) 1000 MG tablet Take 2 tabs now and 2 tabs in 12 hours 4 tablet 0 Taking   Scheduled:  . fentaNYL (SUBLIMAZE) injection  50 mcg Intravenous Once  . [START ON 07/12/2015] insulin aspart  2-6 Units Subcutaneous 6 times per day  . pantoprazole (PROTONIX) IV  40 mg Intravenous QHS   Infusions:  . sodium chloride    . fentaNYL infusion INTRAVENOUS      Assessment: 74yo female with history of CAD and PE/DVT on xarelto PTA presents with PEA arrest. Pharmacy is consulted to dose heparin for pulmonary embolus. CBC wnl, trop 0.04, sCr 1.2, and baseline aPTT/HL pending. Will wait to determine if patient took xarelto today prior to arrest.  Goal of Therapy:  Heparin level 0.3-0.7 units/ml; aPTT 66-102 sec Monitor platelets by anticoagulation protocol: Yes   Plan:  F/u baseline aPTT/HL to start heparin Continue to monitor H&H and platelets  Andrey Cota. Diona Foley, PharmD Clinical Pharmacist Pager [phone] 06/23/2015,10:28 PM   Addendum: Family unsure when pt's last dose of Xarelto was taken but they think it was 8/23. Baseline heparin level 0.28 (elevated but not dramatically indicating that pt probably did not take Xarelto dose on 8/24). Baseline aPTT 33 sec. Noted that Xarelto is being held and plan to bridge with heparin. Head CT negative for hemorrhage but suspicious for ischemia.  Plan: Will begin heparin gtt at 1000 units/hr. No bolus. Will f/u 8 hour heparin level and aPTT - may be correlating by next check Daily heparin level and aPTT  Sherlon Handing, PharmD, BCPS Clinical pharmacist, pager [phone] 07/12/2015 1:49 AM

## 2015-07-11 NOTE — H&P (Signed)
PULMONARY / CRITICAL CARE MEDICINE   Name: [name] MRN: [id number] DOB: May 30, 1941    ADMISSION DATE:  06/30/2015 CONSULTATION DATE:  07/10/2015  REFERRING MD :  EDP Dr Roxanne Mins  CHIEF COMPLAINT:  Cardiac arrest  INITIAL PRESENTATION: 74 year old female, Jehova's witness, presented to [facility] ED 8/24 in progress PEA arrest, prolonged downtime. 40 mins ACLS required for ROSC, however, unknown downtime prior to that. As many as several hours. PCCM to admit.   STUDIES:  CT head 8/24 >>>  SIGNIFICANT EVENTS:   HISTORY OF PRESENT ILLNESS:  74 year old female with PMH as below, which includes DM, CAD, DVT/PE on xarelto, SLE, Breast Ca, seizures, and ischemic cardiomyopathy. 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 Has recently been admitted for urosepsis. She was not evaluated for this, and it is unclear whether or not this was the first occurrence of this. 8/24 she was last seen normal at about 12 noon. Her family left her for several hours, and returned home at 1830 to find her unresponsive. EMS was called, and upon their arrival she was noted to be in PEA. She received 20 mins of ACLS prior to ROSC. She was intubated in route and transported to ED. In ED she suffered another brief PEA arrest of about 4 minutes. Initially post arrest she was completely unresponsive with GCS 3, however, she has developed some posturing since that time. PCCM has been asked to admit. Of note there is questionable seizure activity a year ago, and possibly the day before the arrest, however, grandsons who are reporting this are unclear of events.   PAST MEDICAL HISTORY :   has a past medical history of Hyperplastic colon polyp; Diabetes mellitus type II; Osteoarthritis; GERD (gastroesophageal reflux disease); Osteopenia; Lupus; OA (osteoarthritis of spine); DVT (deep venous thrombosis); Melanoma in situ of back (06/23/2011); Pulmonary embolus and infarction (08/05/2011); Allergy; Clotting  disorder; C. difficile diarrhea; Skin cancer; PONV (postoperative nausea and vomiting); Hypertension; Breast cancer; Refusal of blood transfusions as patient is Jehovah's Witness; CAD (coronary artery disease); Ischemic cardiomyopathy; HTN (hypertension); Dyslipidemia; Transaminitis; Diabetes mellitus without complication; and Diastolic congestive heart failure.  has past surgical history that includes Cholecystectomy; Breast biopsy; Breast lumpectomy; Appendectomy; Skin cancer excision; Breast lumpectomy with needle localization and axillary sentinel lymph node bx (Right, 06/17/2013); Irrigation and debridement abscess (Right, 02/18/2014); and left heart cath (N/A, 07/22/2014). Prior to Admission medications   Medication Sig Start Date End Date Taking? Authorizing Provider  anastrozole (ARIMIDEX) 1 MG tablet TAKE 1 TABLET (1 MG TOTAL) BY MOUTH DAILY. 06/19/15   Volanda Napoleon, MD  B Complex-C (SUPER B COMPLEX PO) Take 1 tablet by mouth daily.    Historical Provider, MD  Calcium Carbonate-Vitamin D (CALCIUM-VITAMIN D) 600-200 MG-UNIT CAPS Take 1 tablet by mouth daily.     Historical Provider, MD  clopidogrel (PLAVIX) 75 MG tablet Take 1 tablet (75 mg total) by mouth daily. 09/05/14   Lelon Perla, MD  doxycycline (VIBRA-TABS) 100 MG tablet Take 1 tablet (100 mg total) by mouth 2 (two) times daily. 05/16/15   Debbrah Alar, NP  furosemide (LASIX) 20 MG tablet TAKE 1 TABLET (20 MG TOTAL) BY MOUTH DAILY AS NEEDED. 05/22/15   Debbrah Alar, NP  gabapentin (NEURONTIN) 100 MG capsule Take 1 capsule (100 mg total) by mouth at bedtime. 04/02/15   Florencia Reasons, MD  Glucosamine-Chondroitin (GLUCOSAMINE CHONDR COMPLEX PO) Take 1 capsule by mouth daily.    Historical Provider, MD  insulin glargine (LANTUS) 100 UNIT/ML injection Take 20 units in the morning and 40 units at bedtime. 04/02/15   Florencia Reasons, MD  insulin regular (NOVOLIN R,HUMULIN R) 100 units/mL injection Inject into the skin 3 (three) times daily before  meals. Sliding scale 16-30  Units: Humulin    Historical Provider, MD  lisinopril (PRINIVIL,ZESTRIL) 20 MG tablet Take 1 tablet (20 mg total) by mouth daily. 06/26/15   Debbrah Alar, NP  metoprolol succinate (TOPROL XL) 50 MG 24 hr tablet Take 1 tablet (50 mg total) by mouth daily. Take with or immediately following a meal. 05/07/15   Debbrah Alar, NP  nitroGLYCERIN (NITROSTAT) 0.4 MG SL tablet Place 1 tablet (0.4 mg total) under the tongue every 5 (five) minutes as needed for chest pain (up to 3 doses - if no relief, call 911). 07/25/14   Dayna N Dunn, PA-C  pravastatin (PRAVACHOL) 40 MG tablet TAKE 1 TABLET (40 MG TOTAL) BY MOUTH EVERY EVENING. FOR CHOLESTEROL AT NIGHT. 05/24/15   Lelon Perla, MD  Rivaroxaban (XARELTO) 15 MG TABS tablet Take 1 tablet (15 mg total) by mouth daily with supper. 10/02/14   Eliezer Bottom, NP  valACYclovir (VALTREX) 1000 MG tablet Take 2 tabs now and 2 tabs in 12 hours 05/07/15   Debbrah Alar, NP   Allergies  Allergen Reactions  . Metformin Diarrhea  . Pioglitazone Other (See Comments)    weight gain  . Vancomycin Rash    Erythematous rash, hands and toes became cyanotic.      FAMILY HISTORY:   SOCIAL HISTORY:  reports that she has never smoked. She has never used smokeless tobacco. She reports that she does not drink alcohol or use illicit drugs.  REVIEW OF SYSTEMS:  Unable due to encephaloopathy  SUBJECTIVE:   VITAL SIGNS: Temp:  [95.2 F (35.1 C)] 95.2 F (35.1 C) (08/24 1920) Pulse Rate:  [91] 91 (08/24 1920) BP: (130)/(98) 130/98 mmHg (08/24 1920) SpO2:  [98 %] 98 % (08/24 1920) FiO2 (%):  [100 %] 100 % (08/24 1928) HEMODYNAMICS:   VENTILATOR SETTINGS: Vent Mode:  [-] PRVC FiO2 (%):  [100 %] 100 % Set Rate:  [14 bmp] 14 bmp Vt Set:  [500 mL] 500 mL PEEP:  [5 cmH20] 5 cmH20 INTAKE / OUTPUT: No intake or output data in the 24 hours ending 07/14/2015 2012  PHYSICAL EXAMINATION: General:  Elderly female in no distress on  vent Neuro:  GCS 3 (e1,v1,m2). No pupillary response to light.  HEENT:  [place]/AT, no JVD noted Cardiovascular:  RRR, no MRG Lungs:  Clear bilateral breath sounds Abdomen:  Soft, non-distended Musculoskeletal:  No acute deformity or edema Skin:  Grossly intact  LABS:  CBC No results for input(s): WBC, HGB, HCT, PLT in the last 168 hours. Coag's No results for input(s): APTT, INR in the last 168 hours. BMET No results for input(s): NA, K, CL, CO2, BUN, CREATININE, GLUCOSE in the last 168 hours. Electrolytes No results for input(s): CALCIUM, MG, PHOS in the last 168 hours. Sepsis Markers No results for input(s): LATICACIDVEN, PROCALCITON, O2SATVEN in the last 168 hours. ABG No results for input(s): PHART, PCO2ART, PO2ART in the last 168 hours. Liver Enzymes No results for input(s): AST, ALT, ALKPHOS, BILITOT, ALBUMIN in the last 168 hours. Cardiac Enzymes No results for input(s): TROPONINI, PROBNP in the last 168 hours. Glucose No results for input(s): GLUCAP in the last 168 hours.  Imaging Dg Chest Port 1 View  06/23/2015   CLINICAL DATA:  Status post intubation.  EXAM: PORTABLE CHEST - 1 VIEW  COMPARISON:  03/29/2015  FINDINGS: The endotracheal tube tip is in the right mainstem bronchus. Recommend withdrawing by 1-1/2 to 2 cm. Normal heart size. Asymmetric opacity within the right upper lobe is noted, nonspecific. No pleural effusion or edema.  IMPRESSION: 1. ET tube tip is in the right mainstem bronchus. Recommend withdrawal by 1-1/2 to 2 cm. 2. Right upper lobe airspace opacity. 3. These results were called by telephone at the time of interpretation on 06/28/2015 at 8:03 pm to Dr. Delora Fuel , who verbally acknowledged these results.   Electronically Signed   By: Kerby Moors M.D.   On: 06/25/2015 20:03     ASSESSMENT / PLAN:  PULMONARY OETT 8/24 >>> A: Acute respiratory failure in setting cardiac arrest  P:   Full vent support Check ABG CXR in AM VAP bundle SBT    CARDIOVASCULAR CVL 8/24 >>> A:  PEA arrest CAD Ischemic cardiomyopathy H/o HTN  P:  Tele MAP goal > 65 mm/Hg Trend lactic Trend troponin IVF resusctitaiton Echo EKG in AM  RENAL A:   No acute issues  P:   bmet pending. Will follow  GASTROINTESTINAL A:  GERD Transaminitis > suspect shock liver  P:   NPO PPI Trend LFT  HEMATOLOGIC A:   H/o DVT/PE on xarelto  P:  Hold xarelto Initiate heparin gtt after heat CT cleared.  DVT legs Check Coags Follow CBC   INFECTIOUS A:   ? Aspiration PNA  P:   BCx2 8/24 >>> UC 8/24 >>> Sputum 8/24 >>> Abx: Zosyn, start date 8/24 >>> Abx: Vanc, start date 8/24 >>> Abx: Doxy, start date 8/24 >>> UA pending  ENDOCRINE A:   DM  P:   CBG and SSI  NEUROLOGIC A:   Acute anoxic encephlaopathy ?Seizure history  P:   RASS goal: 0 Fentanyl infusion sedation CT head EEG Consult neurology   FAMILY  - Updates: Discussed with daughter who lives with patient. Updated about her mothers status and that prognosis of neurologic recovery is extremely guarded. Discussed GOC, family deciding. Full code for now.  - Inter-disciplinary family meet or Palliative Care meeting due by:  9/1   Georgann Housekeeper, AGACNP-BC [place] [facility] Pager [phone] or [phone]  07/18/2015 9:29 PM

## 2015-07-12 ENCOUNTER — Inpatient Hospital Stay (HOSPITAL_COMMUNITY): Payer: PPO

## 2015-07-12 ENCOUNTER — Inpatient Hospital Stay (INDEPENDENT_AMBULATORY_CARE_PROVIDER_SITE_OTHER): Payer: PPO

## 2015-07-12 DIAGNOSIS — I469 Cardiac arrest, cause unspecified: Principal | ICD-10-CM

## 2015-07-12 DIAGNOSIS — J9601 Acute respiratory failure with hypoxia: Secondary | ICD-10-CM

## 2015-07-12 DIAGNOSIS — R4020 Unspecified coma: Secondary | ICD-10-CM

## 2015-07-12 DIAGNOSIS — I2510 Atherosclerotic heart disease of native coronary artery without angina pectoris: Secondary | ICD-10-CM | POA: Diagnosis not present

## 2015-07-12 DIAGNOSIS — Z86711 Personal history of pulmonary embolism: Secondary | ICD-10-CM

## 2015-07-12 DIAGNOSIS — J9621 Acute and chronic respiratory failure with hypoxia: Secondary | ICD-10-CM

## 2015-07-12 DIAGNOSIS — J9600 Acute respiratory failure, unspecified whether with hypoxia or hypercapnia: Secondary | ICD-10-CM | POA: Insufficient documentation

## 2015-07-12 LAB — PROTIME-INR
INR: 1.53 — ABNORMAL HIGH (ref 0.00–1.49)
Prothrombin Time: 18.4 seconds — ABNORMAL HIGH (ref 11.6–15.2)

## 2015-07-12 LAB — POCT I-STAT 3, ART BLOOD GAS (G3+)
Acid-base deficit: 4 mmol/L — ABNORMAL HIGH (ref 0.0–2.0)
Bicarbonate: 19.9 mEq/L — ABNORMAL LOW (ref 20.0–24.0)
O2 Saturation: 100 %
PH ART: 7.392 (ref 7.350–7.450)
TCO2: 21 mmol/L (ref 0–100)
pCO2 arterial: 33 mmHg — ABNORMAL LOW (ref 35.0–45.0)
pO2, Arterial: 179 mmHg — ABNORMAL HIGH (ref 80.0–100.0)

## 2015-07-12 LAB — GLUCOSE, CAPILLARY
GLUCOSE-CAPILLARY: 159 mg/dL — AB (ref 65–99)
GLUCOSE-CAPILLARY: 132 mg/dL — AB (ref 65–99)
Glucose-Capillary: 122 mg/dL — ABNORMAL HIGH (ref 65–99)
Glucose-Capillary: 134 mg/dL — ABNORMAL HIGH (ref 65–99)
Glucose-Capillary: 145 mg/dL — ABNORMAL HIGH (ref 65–99)
Glucose-Capillary: 155 mg/dL — ABNORMAL HIGH (ref 65–99)

## 2015-07-12 LAB — CBC
HEMATOCRIT: 34.1 % — AB (ref 36.0–46.0)
HEMOGLOBIN: 11.1 g/dL — AB (ref 12.0–15.0)
MCH: 27.6 pg (ref 26.0–34.0)
MCHC: 32.6 g/dL (ref 30.0–36.0)
MCV: 84.8 fL (ref 78.0–100.0)
Platelets: 155 10*3/uL (ref 150–400)
RBC: 4.02 MIL/uL (ref 3.87–5.11)
RDW: 13.9 % (ref 11.5–15.5)
WBC: 17.3 10*3/uL — ABNORMAL HIGH (ref 4.0–10.5)

## 2015-07-12 LAB — BASIC METABOLIC PANEL
Anion gap: 10 (ref 5–15)
BUN: 22 mg/dL — AB (ref 6–20)
CO2: 21 mmol/L — ABNORMAL LOW (ref 22–32)
Calcium: 7.7 mg/dL — ABNORMAL LOW (ref 8.9–10.3)
Chloride: 110 mmol/L (ref 101–111)
Creatinine, Ser: 0.94 mg/dL (ref 0.44–1.00)
GFR calc Af Amer: >60 mL/min (ref >60)
GFR, EST NON AFRICAN AMERICAN: 58 mL/min — AB (ref >60)
GLUCOSE: 184 mg/dL — AB (ref 65–99)
POTASSIUM: 3.7 mmol/L (ref 3.5–5.1)
Sodium: 141 mmol/L (ref 135–145)

## 2015-07-12 LAB — LACTIC ACID, PLASMA
Lactic Acid, Venous: 1.9 mmol/L (ref 0.5–2.0)
Lactic Acid, Venous: 5 mmol/L (ref 0.5–2.0)

## 2015-07-12 LAB — PHOSPHORUS
Phosphorus: 2.7 mg/dL (ref 2.5–4.6)
Phosphorus: 2.9 mg/dL (ref 2.5–4.6)

## 2015-07-12 LAB — HEPARIN LEVEL (UNFRACTIONATED)
HEPARIN UNFRACTIONATED: 0.28 [IU]/mL — AB (ref 0.30–0.70)
Heparin Unfractionated: 0.62 IU/mL (ref 0.30–0.70)

## 2015-07-12 LAB — APTT
aPTT: 33 s (ref 24–37)
aPTT: 100 seconds — ABNORMAL HIGH (ref 24–37)

## 2015-07-12 LAB — MAGNESIUM
Magnesium: 1.7 mg/dL (ref 1.7–2.4)
Magnesium: 1.7 mg/dL (ref 1.7–2.4)

## 2015-07-12 LAB — TROPONIN I
TROPONIN I: 0.94 ng/mL — AB (ref <0.031)
Troponin I: 0.64 ng/mL
Troponin I: 0.54 ng/mL (ref <0.031)

## 2015-07-12 LAB — MRSA PCR SCREENING: MRSA BY PCR: INVALID — AB

## 2015-07-12 LAB — TRIGLYCERIDES: TRIGLYCERIDES: 71 mg/dL (ref <150)

## 2015-07-12 MED ORDER — HEPARIN (PORCINE) IN NACL 100-0.45 UNIT/ML-% IJ SOLN
1100.0000 [IU]/h | INTRAMUSCULAR | Status: DC
  Administered 2015-07-12 – 2015-07-14 (×3): 1000 [IU]/h via INTRAVENOUS
  Administered 2015-07-15: 1100 [IU]/h via INTRAVENOUS
  Filled 2015-07-12 (×6): qty 250

## 2015-07-12 MED ORDER — FENTANYL CITRATE (PF) 100 MCG/2ML IJ SOLN
50.0000 ug | INTRAMUSCULAR | Status: DC | PRN
  Administered 2015-07-14: 50 ug via INTRAVENOUS
  Filled 2015-07-12 (×2): qty 2

## 2015-07-12 MED ORDER — FENTANYL CITRATE (PF) 100 MCG/2ML IJ SOLN
50.0000 ug | INTRAMUSCULAR | Status: DC | PRN
  Administered 2015-07-13 – 2015-07-15 (×8): 50 ug via INTRAVENOUS
  Filled 2015-07-12 (×7): qty 2

## 2015-07-12 MED ORDER — PERFLUTREN LIPID MICROSPHERE
1.0000 mL | INTRAVENOUS | Status: AC | PRN
  Filled 2015-07-12: qty 10

## 2015-07-12 MED ORDER — SODIUM CHLORIDE 0.9 % IV SOLN
1000.0000 mg | Freq: Two times a day (BID) | INTRAVENOUS | Status: DC
Start: 2015-07-12 — End: 2015-07-13
  Administered 2015-07-12 – 2015-07-13 (×2): 1000 mg via INTRAVENOUS
  Filled 2015-07-12 (×3): qty 10

## 2015-07-12 MED ORDER — PIPERACILLIN-TAZOBACTAM 3.375 G IVPB 30 MIN
3.3750 g | INTRAVENOUS | Status: AC
Start: 2015-07-12 — End: 2015-07-12
  Administered 2015-07-12: 3.375 g via INTRAVENOUS
  Filled 2015-07-12: qty 50

## 2015-07-12 MED ORDER — PIPERACILLIN-TAZOBACTAM 3.375 G IVPB
3.3750 g | Freq: Three times a day (TID) | INTRAVENOUS | Status: DC
  Administered 2015-07-12 – 2015-07-15 (×10): 3.375 g via INTRAVENOUS
  Filled 2015-07-12 (×12): qty 50

## 2015-07-12 MED ORDER — DOXYCYCLINE HYCLATE 100 MG IV SOLR
100.0000 mg | Freq: Two times a day (BID) | INTRAVENOUS | Status: DC
  Administered 2015-07-12: 100 mg via INTRAVENOUS
  Filled 2015-07-12 (×2): qty 100

## 2015-07-12 MED ORDER — PROPOFOL 1000 MG/100ML IV EMUL
0.0000 ug/kg/min | INTRAVENOUS | Status: DC
  Administered 2015-07-12: 5 ug/kg/min via INTRAVENOUS
  Filled 2015-07-12: qty 100

## 2015-07-12 MED ORDER — SODIUM CHLORIDE 0.9 % IV SOLN
750.0000 mg | Freq: Two times a day (BID) | INTRAVENOUS | Status: DC
  Administered 2015-07-12: 750 mg via INTRAVENOUS
  Filled 2015-07-12 (×2): qty 7.5

## 2015-07-12 MED ORDER — LORAZEPAM 2 MG/ML IJ SOLN
4.0000 mg | Freq: Once | INTRAMUSCULAR | Status: AC
  Administered 2015-07-12: 4 mg via INTRAVENOUS

## 2015-07-12 MED ORDER — LINEZOLID 600 MG/300ML IV SOLN
600.0000 mg | INTRAVENOUS | Status: AC
  Administered 2015-07-12: 600 mg via INTRAVENOUS
  Filled 2015-07-12: qty 300

## 2015-07-12 MED ORDER — LORAZEPAM 2 MG/ML IJ SOLN
INTRAMUSCULAR | Status: AC
  Filled 2015-07-12: qty 2

## 2015-07-12 MED ORDER — CHLORHEXIDINE GLUCONATE 0.12% ORAL RINSE (MEDLINE KIT)
15.0000 mL | Freq: Two times a day (BID) | OROMUCOSAL | Status: DC
  Administered 2015-07-12 – 2015-07-15 (×7): 15 mL via OROMUCOSAL

## 2015-07-12 MED ORDER — PERFLUTREN LIPID MICROSPHERE
1.0000 mL | INTRAVENOUS | Status: AC | PRN
  Administered 2015-07-12: 2 mL via INTRAVENOUS
  Filled 2015-07-12: qty 10

## 2015-07-12 MED ORDER — LINEZOLID 600 MG/300ML IV SOLN
600.0000 mg | Freq: Two times a day (BID) | INTRAVENOUS | Status: DC
  Administered 2015-07-12 (×2): 600 mg via INTRAVENOUS
  Filled 2015-07-12 (×3): qty 300

## 2015-07-12 MED ORDER — ANTISEPTIC ORAL RINSE SOLUTION (CORINZ)
7.0000 mL | OROMUCOSAL | Status: DC
  Administered 2015-07-12 – 2015-07-15 (×34): 7 mL via OROMUCOSAL

## 2015-07-12 MED ORDER — SODIUM CHLORIDE 0.9 % IV BOLUS (SEPSIS)
1000.0000 mL | Freq: Once | INTRAVENOUS | Status: AC
  Administered 2015-07-12: 1000 mL via INTRAVENOUS

## 2015-07-12 MED ORDER — LORAZEPAM 2 MG/ML IJ SOLN
2.0000 mg | INTRAMUSCULAR | Status: DC | PRN
  Administered 2015-07-12 – 2015-07-14 (×2): 2 mg via INTRAVENOUS
  Filled 2015-07-12 (×4): qty 1

## 2015-07-12 NOTE — Progress Notes (Signed)
VASCULAR LAB PRELIMINARY  PRELIMINARY  PRELIMINARY  PRELIMINARY  Bilateral lower extremity venous duplex  completed.    Preliminary report:  Bilateral:  No obvious evidence of DVT, superficial thrombosis, or Baker's Cyst.  Unable to image right CFV, SFJ due to arterial line.  Unable to visualize right calf veins due to poor acoustic trasnmission.   Trey Bebee, RVT 07/12/2015, 10:51 AM

## 2015-07-12 NOTE — Progress Notes (Signed)
Pt having what appears to be tonic clonic seizure activity for approximately 2 minutes, followed by what looked like a gran mal seizure with decorticate posturing.  Georgann Housekeeper, NP at bedside.  Versed given followed by Ativan.  Pt currently resting.  Keppra ordered. Also notified Dr. Janann Colonel of recent events.  Will continue to monitor.  Vista Lawman, RN

## 2015-07-12 NOTE — Progress Notes (Signed)
230cc fentanyl wasted from bag.  Witnessed by Tommy Rainwater RN  Vista Lawman, RN

## 2015-07-12 NOTE — Progress Notes (Signed)
[facility] Progress Note Patient Name: [name] DOB: 07/16/1941 MRN: [id number]   Date of Service  07/12/2015  HPI/Events of Note  Ongoing seizure activity with episode of bradycardia.   Camera check shows patient with seizure activity.  Placed on Keppra earlier.  eICU Interventions  Plan: Change from fentanyl to propofol for continuous sedation 4 mg ativan IV times one now     Intervention Category Major Interventions: Seizures - evaluation and management  Savior Himebaugh 07/12/2015, 12:46 AM

## 2015-07-12 NOTE — Progress Notes (Signed)
PULMONARY / CRITICAL CARE MEDICINE   Name: [name] MRN: [id number] DOB: Jul 16, 1941    ADMISSION DATE:  07/10/2015 CONSULTATION DATE:  06/23/2015  REFERRING MD :  EDP Dr Roxanne Mins  CHIEF COMPLAINT:  Cardiac arrest  INITIAL PRESENTATION: 74 year old female, Jehova's witness, presented to [facility] ED 8/24 in progress PEA arrest, prolonged downtime. 40 mins ACLS required for ROSC, however, unknown downtime prior to that. As many as several hours. PCCM to admit.   STUDIES:  CT head 8/24 >>>  SIGNIFICANT EVENTS:   HISTORY OF PRESENT ILLNESS:  74 year old female with PMH as below, which includes DM, CAD, DVT/PE on xarelto, SLE, Breast Ca, seizures, and ischemic cardiomyopathy. 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 Has recently been admitted for urosepsis. 8/24 she was last seen normal at about 12 noon. Her family left her for several hours, and returned home at 1830 to find her unresponsive. EMS was called, and upon their arrival she was noted to be in PEA. She received 20 mins of ACLS prior to ROSC. She was intubated in route and transported to ED. In ED she suffered another brief PEA arrest of about 4 minutes. Initially post arrest she was completely unresponsive with GCS 3, however, she has developed some posturing since that time. PCCM has been asked to admit. Of note there is questionable seizure activity a year ago, and possibly the day before the arrest, however, grandsons who are reporting this are unclear of events.    SUBJECTIVE: Low gr fever Remains unresponsive, critically ill Good UO   VITAL SIGNS: Temp:  [95.2 F (35.1 C)-100.6 F (38.1 C)] 97.6 F (36.4 C) (08/25 0754) Pulse Rate:  [66-100] 82 (08/25 0742) Resp:  [15-40] 30 (08/25 0742) BP: (87-180)/(45-98) 178/67 mmHg (08/25 0742) SpO2:  [89 %-100 %] 97 % (08/25 0742) Arterial Line BP: (108-158)/(45-74) 108/45 mmHg (08/25 0700) FiO2 (%):  [40 %-100 %] 60 % (08/25 0742) Weight:  [196 lb 3.4 oz  (89 kg)-208 lb 12.4 oz (94.7 kg)] 208 lb 12.4 oz (94.7 kg) (08/25 0400) HEMODYNAMICS: CVP:  [4 mmHg-12 mmHg] 4 mmHg VENTILATOR SETTINGS: Vent Mode:  [-] PRVC FiO2 (%):  [40 %-100 %] 60 % Set Rate:  [14 bmp-20 bmp] 20 bmp Vt Set:  [500 mL] 500 mL PEEP:  [5 cmH20] 5 cmH20 Plateau Pressure:  [20 cmH20-24 cmH20] 20 cmH20 INTAKE / OUTPUT:  Intake/Output Summary (Last 24 hours) at 07/12/15 0814 Last data filed at 07/12/15 0800  Gross per 24 hour  Intake 4530.73 ml  Output   2775 ml  Net 1755.73 ml    PHYSICAL EXAMINATION: General:  Elderly female in no distress on vent Neuro:  GCS 3 , comatose, no response to DPS,pupils 84mm BERTL, upward gaze, plantars Bl upgoing HEENT:  [place]/AT, no JVD noted Cardiovascular:  RRR, no MRG Lungs:  Clear bilateral breath sounds Abdomen:  Soft, non-distended Musculoskeletal:  No acute deformity or edema Skin:  Grossly intact  LABS:  CBC  Recent Labs Lab 06/23/2015 1923 07/12/15 0446  WBC 14.9* 17.3*  HGB 12.5 11.1*  HCT 39.1 34.1*  PLT 188 155   Coag's  Recent Labs Lab 07/12/15 0030  APTT 33  INR 1.53*   BMET  Recent Labs Lab 07/12/2015 1923 07/12/15 0446  NA 139 141  K 3.8 3.7  CL 105 110  CO2 12* 21*  BUN 18 22*  CREATININE 1.19* 0.94  GLUCOSE 223* 184*   Electrolytes  Recent Labs  Lab 07/09/2015 1923 07/12/15 0030 07/12/15 0446  CALCIUM 8.6*  --  7.7*  MG  --  1.7 1.7  PHOS  --  2.7 2.9   Sepsis Markers  Recent Labs Lab 07/05/2015 1916 07/12/15 0045 07/12/15 0447  LATICACIDVEN 8.8* 5.0* 1.9   ABG  Recent Labs Lab 06/20/2015 2352 07/12/15 0405  PHART 7.177* 7.392  PCO2ART 51.2* 33.0*  PO2ART >450* 179.0*   Liver Enzymes  Recent Labs Lab 06/18/2015 1923  AST 555*  ALT 522*  ALKPHOS 107  BILITOT 0.8  ALBUMIN 3.2*   Cardiac Enzymes  Recent Labs Lab 06/25/2015 1923 07/08/2015 2132 07/12/15 0446  TROPONINI 0.04* 0.64* 0.94*   Glucose  Recent Labs Lab 06/26/2015 2322 07/12/15 0348  GLUCAP 130* 145*     Imaging Ct Head Wo Contrast  07/04/2015   CLINICAL DATA:  Found on the floor unresponsive. Stated history of coma. Post cardiac arrest.  EXAM: CT HEAD WITHOUT CONTRAST  TECHNIQUE: Contiguous axial images were obtained from the base of the skull through the vertex without intravenous contrast.  COMPARISON:  03/29/2015  FINDINGS: Ill-defined cortical hypodensity in the anterior right temporal lobe, with questionable small focus of cortical hypodensity in the left temporal lobe, may reflect subacute ischemia. No significant mass effect or midline shift. No intracranial hemorrhage. No cerebral edema. Atrophy, chronic small vessel ischemia, and remote lacunar infarct in the right caudate, unchanged. No hydrocephalus. The basilar cisterns are patent. No intracranial fluid collection. Calvarium is intact. Minimal mucosal thickening of left side of sphenoid sinus, remaining paranasal sinuses and mastoid air cells are well aerated.  IMPRESSION: 1. Findings suspicious for acute/subacute ischemia involving the anterior right temporal lobe, and possibly left temporal lobe. 2. No intracranial hemorrhage or findings of diffuse cerebral edema. These results were called by telephone at the time of interpretation on 07/01/2015 at 91:69 pm to Dr. Roxanne Mins , who verbally acknowledged these results.   Electronically Signed   By: Jeb Levering M.D.   On: 07/12/2015 22:29   Dg Chest Port 1 View  07/12/2015   CLINICAL DATA:  Intubation.  EXAM: PORTABLE CHEST - 1 VIEW  COMPARISON:  07/12/2015.  FINDINGS: Endotracheal tube tip in the orifice of the right mainstem bronchus. Proximal repositioning suggested. Left IJ line and NG tube in stable position. Cardiomegaly with bilateral pulmonary interstitial prominence and small bilateral effusions consistent with congestive heart failure. Pulmonary interstitial edema is increased from prior exam. Surgical clips over right chest.  IMPRESSION: 1. Endotracheal tube tip is in the orifice of  the right mainstem bronchus. Proximal repositioning suggested. Remaining lines and tubes in stable position.  2. Cardiomegaly with diffuse bilateral pulmonary interstitial prominence consistent with pulmonary interstitial edema. Pulmonary interstitial edema has increased slightly from prior exam. Small bilateral pleural effusions.  Critical Value/emergent results were called by telephone at the time of interpretation on 07/12/2015 at 7:24 am to nurse Altamese Dilling, who verbally acknowledged these results.   Electronically Signed   By: Marcello Moores  Register   On: 07/12/2015 07:26   Dg Chest Port 1 View  07/12/2015   CLINICAL DATA:  Central line placement.  EXAM: PORTABLE CHEST - 1 VIEW  COMPARISON:  07/12/2015  FINDINGS: Endotracheal tube has been withdrawn. Now tip measures about 9 mm above the carina. Enteric tube tip is coiled in the left upper quadrant consistent with location in the stomach. Interval placement of a left central venous catheter with tip over the mid SVC region just beneath the SVC/brachiocephalic junction. No pneumothorax. Shallow inspiration.  Cardiac enlargement. Mild pulmonary vascular congestion. Mild perihilar edema. No blunting of costophrenic angles.  IMPRESSION: Appliances appear to be in satisfactory position. Mild cardiac enlargement pulmonary vascular congestion with interstitial edema.   Electronically Signed   By: Lucienne Capers M.D.   On: 07/12/2015 03:17   Dg Chest Port 1 View  07/12/2015   CLINICAL DATA:  Intubation.  Shortness of breath.  EXAM: PORTABLE CHEST - 1 VIEW  COMPARISON:  06/27/2015  FINDINGS: Endotracheal tube is again positioned into the right mainstem bronchus. Retraction of about 2 cm is suggested. No change in appearance since prior study. Enteric tube is coiled in the left upper quadrant consistent with location in the stomach. Shallow inspiration. Cardiac enlargement. Pulmonary vascular congestion with probable perihilar edema. No blunting of costophrenic angles. No  pneumothorax.  IMPRESSION: Appliances are unchanged in position. Endotracheal tube remains in the right mainstem bronchus and should be withdrawn 2 cm. Cardiac enlargement with pulmonary vascular congestion and perihilar edema.   Electronically Signed   By: Lucienne Capers M.D.   On: 07/12/2015 01:43   Dg Chest Port 1 View  07/05/2015   CLINICAL DATA:  Status post intubation.  EXAM: PORTABLE CHEST - 1 VIEW  COMPARISON:  03/29/2015  FINDINGS: The endotracheal tube tip is in the right mainstem bronchus. Recommend withdrawing by 1-1/2 to 2 cm. Normal heart size. Asymmetric opacity within the right upper lobe is noted, nonspecific. No pleural effusion or edema.  IMPRESSION: 1. ET tube tip is in the right mainstem bronchus. Recommend withdrawal by 1-1/2 to 2 cm. 2. Right upper lobe airspace opacity. 3. These results were called by telephone at the time of interpretation on 06/22/2015 at 8:03 pm to Dr. Delora Fuel , who verbally acknowledged these results.   Electronically Signed   By: Kerby Moors M.D.   On: 07/17/2015 20:03     ASSESSMENT / PLAN:  PULMONARY OETT 8/24 >>> A: Acute respiratory failure in setting cardiac arrest  P:   Full vent support VAP bundle SBT   CARDIOVASCULAR CVL 8/24 >>> A:  PEA arrest CAD Ischemic cardiomyopathy H/o HTN Prolonged QTc on EKG 8/25  P:  MAP goal > 65 mm/Hg Echo   RENAL A:   At risk AKI  P:  follow   GASTROINTESTINAL A:  GERD Transaminitis > suspect shock liver  P:   NPO PPI Trend LFT  HEMATOLOGIC A:   H/o DVT/PE on xarelto  P:  Hold xarelto Ct heparin gtt  DVT legs    INFECTIOUS A:   ? Aspiration PNA  P:   BCx2 8/24 >>> UC 8/24 >>> Sputum 8/24 >>> Abx: Zosyn, start date 8/24 >>> Abx: Vanc, start date 8/24 >>> Abx: Doxy, start date 8/24 >>> UA pending  ENDOCRINE A:   DM  P:   CBG and SSI  NEUROLOGIC A:   Acute anoxic encephlaopathy ?Seizure history Head CT 8/24 >> Findings suspicious for  acute/subacute ischemia involving the anterior right temporal lobe, and possibly left temporal lobe.  P:   RASS goal: 0 Fentanyl prn  EEG Consulted neurology   FAMILY  - Updates: 8/25  Discussed with daughter who lives with patient. Updated about her mothers status and that prognosis of neurologic recovery is extremely guarded. Discussed GOC, family deciding. Full code for now.  - Inter-disciplinary family meet or Palliative Care meeting due by:  9/1   The patient is critically ill with multiple organ systems failure and requires high complexity decision making for assessment and support,  frequent evaluation and titration of therapies, application of advanced monitoring technologies and extensive interpretation of multiple databases. Critical Care Time devoted to patient care services described in this note independent of APP time is 35 minutes.   Kara Mead MD. Shade Flood. [place] [facility] Pager [phone] If no response call [phone]    07/12/2015 8:23 AM

## 2015-07-12 NOTE — Progress Notes (Addendum)
Pt again having what looks like tonic clonic seizures with brady down to 79s.  Dr. Jimmy Footman, elink MD, notified.  Orders received.  Will continue to monitor.   Vista Lawman, RN

## 2015-07-12 NOTE — Progress Notes (Signed)
eLink Physician-Brief Progress Note Patient Name: [name] DOB: [birth year] MRN: [id number]   Date of Service  07/12/2015  HPI/Events of Note  Earlier had right mainstem intubation.   ETT had been pulled back earlier.   Now unable to hear breath sounds on left per nurse and RT at bedside.  eICU Interventions  Plan: PCXR to evaluate ETT position     Intervention Category Minor Interventions: Clinical assessment - ordering diagnostic tests  DETERDING,ELIZABETH 07/12/2015, 12:57 AM

## 2015-07-12 NOTE — Progress Notes (Signed)
Utilization review completed.  

## 2015-07-12 NOTE — Progress Notes (Addendum)
Initial Nutrition Assessment  DOCUMENTATION CODES:   Obesity unspecified  INTERVENTION:   If TF started, recommend:   Vital High Protein formula -- initiate at 25 ml/hr and increase by 10 ml every 4 hours to goal rate of 45 ml/hr   Prostat liquid protein 30 ml BID  Total TF regimen to provide 1280 kcals, 124 gm protein, 903 ml of free water  NUTRITION DIAGNOSIS:   Inadequate oral intake related to inability to eat as evidenced by NPO status  GOAL:   Provide needs based on ASPEN/SCCM guidelines  MONITOR:   Vent status, Labs, Weight trends, I & O's  REASON FOR ASSESSMENT:   Ventilator  ASSESSMENT:   74 yo Female w/ hx LAD stent, nl EF by echo 2015, lupus, PE/DVT on Xarelto (per hematology), DM, HL, admitted 08/24 after PEA arrest, 20" CPR at home, QT 494 ms.  Patient is currently intubated on ventilator support -- OGT in place MV: 9.8 L/min Temp (24hrs), Avg:98.5 F (36.9 C), Min:95.2 F (35.1 C), Max:100.6 F (38.1 C)   RD unable to complete Nutrition Focused Physical Exam at this time.  CCM note reviewed.  Pt with likely anoxic brain injury.  Prognosis guarded.  Diet Order:  Diet NPO time specified  Skin:  Reviewed, no issues  Last BM:  N/A  Height:   Ht Readings from Last 1 Encounters:  07/10/2015 5\' 6"  (1.676 m)    Weight:   Wt Readings from Last 1 Encounters:  07/12/15 208 lb 12.4 oz (94.7 kg)    Ideal Body Weight:  59 kg  BMI:  Body mass index is 33.71 kg/(m^2).  Estimated Nutritional Needs:   Kcal:  0258-5277  Protein:  120-130 gm  Fluid:  per MD  EDUCATION NEEDS:   No education needs identified at this time  Arthur Holms, RD, LDN Pager #: [phone] After-Hours Pager #: [phone]

## 2015-07-12 NOTE — Procedures (Addendum)
History: 74 yo F with cardiac arrest  Sedation: propofol  Technique: This is a 19 channel routine scalp EEG performed at the bedside with bipolar and monopolar montages arranged in accordance to the international 10/20 system of electrode placement. One channel was dedicated to EKG recording.    Background: The background is discontinuous with trains of centrally predominant sharp wave discharges interspersed with periods of 1-2 seconds of relative attenuation, though there is still some irregular delta activity during these periods of attenuation. There are 2 periods where the discharges become slightly more continuous which could be suggestive of seizure, but I do not feel this is definite.  Photic stimulation: Physiologic driving is not performed  EEG Abnormalities: 1) frequent centrally predominant sharp wave discharges occuring in trains without evolution to suggest ongoing seizure. 2) periods of EEG that appear to be approaching a burst suppression pattern  Clinical Interpretation: This EEG is consistent with a severe generalized dysfunction with potential epileptogenicity from the central regions. 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   Roland Rack, MD [facility] [phone]  If 7pm- 7am, please page neurology on call as listed in [place].

## 2015-07-12 NOTE — Procedures (Signed)
Central Venous Catheter Insertion Procedure Note [name] [id number] June 13, 1941  Procedure: Insertion of Central Venous Catheter Indications: Assessment of intravascular volume, Drug and/or fluid administration and Frequent blood sampling  Procedure Details Consent: Risks of procedure as well as the alternatives and risks of each were explained to the (patient/caregiver).  Consent for procedure obtained. Time Out: Verified patient identification, verified procedure, site/side was marked, verified correct patient position, special equipment/implants available, medications/allergies/relevent history reviewed, required imaging and test results available.  Performed  Maximum sterile technique was used including antiseptics, cap, gloves, gown, hand hygiene, mask and sheet. Skin prep: Chlorhexidine; local anesthetic administered A antimicrobial bonded/coated triple lumen catheter was placed in the left internal jugular vein using the Seldinger technique. Ultrasound guidance used.Yes.   Catheter placed to 20 cm. Blood aspirated via all 3 ports and then flushed x 3. Line sutured x 2 and dressing applied.  Evaluation Blood flow good Complications: No apparent complications Patient did tolerate procedure well. Chest X-ray ordered to verify placement.  CXR: pending.  Georgann Housekeeper, AGACNP-BC [facility] Pulmonology/Critical Care Pager [phone] or [phone]  07/12/2015 3:07 AM

## 2015-07-12 NOTE — Progress Notes (Signed)
ANTICOAGULATION CONSULT NOTE - Follow Up Consult  Pharmacy Consult for Heparin bridge Indication:  PE history (previously on Xarelto)  Allergies  Allergen Reactions  . Metformin Diarrhea  . Pioglitazone Other (See Comments)    weight gain  . Vancomycin Rash    Erythematous rash, hands and toes became cyanotic.      Labs:  Recent Labs  06/23/2015 1923 06/20/2015 2132 07/12/15 0030 07/12/15 0446 07/12/15 1000 07/12/15 1300  HGB 12.5  --   --  11.1*  --   --   HCT 39.1  --   --  34.1*  --   --   PLT 188  --   --  155  --   --   APTT  --   --  33  --   --  100*  LABPROT  --   --  18.4*  --   --   --   INR  --   --  1.53*  --   --   --   HEPARINUNFRC  --   --  0.28*  --   --  0.62  CREATININE 1.19*  --   --  0.94  --   --   TROPONINI 0.04* 0.64*  --  0.94* 0.54*  --     Estimated Creatinine Clearance: 60 mL/min (by C-G formula based on Cr of 0.94).    Assessment: 74yo female with history of CAD and PE/DVT on xarelto PTA presents with PEA arrest Heparin level this afternoon = 0.62, PTT = 100 seconds  (therapeutic and labs are correlating)  Goal of Therapy:  Heparin level 0.3-0.7 units/ml Monitor platelets by anticoagulation protocol: Yes  PTT = 66 to 102 seconds   Plan:  Continue heparin at 1000 units / hr Follow up AM labs  Thank you Anette Guarneri, PharmD [phone]  07/12/2015,2:04 PM

## 2015-07-12 NOTE — Procedures (Signed)
Arterial Catheter Insertion Procedure Note ACHSAH MCQUADE [id number] [birth year]  Procedure: Insertion of Arterial Catheter  Indications: Blood pressure monitoring  Procedure Details Consent: Risks of procedure as well as the alternatives and risks of each were explained to the (patient/caregiver).  Consent for procedure obtained. Time Out: Verified patient identification, verified procedure, site/side was marked, verified correct patient position, special equipment/implants available, medications/allergies/relevent history reviewed, required imaging and test results available.  Performed  Maximum sterile technique was used including antiseptics, cap, gloves, gown, hand hygiene, mask and sheet. Skin prep: Chlorhexidine; local anesthetic administered 20 gauge catheter was inserted into right femoral artery using the Seldinger technique.  Evaluation Blood flow good; BP tracing good. Complications: No apparent complications.  Georgann Housekeeper, AGACNP-BC [facility] Pulmonology/Critical Care Pager [phone] or [phone]  07/12/2015 3:09 AM

## 2015-07-12 NOTE — Progress Notes (Signed)
Subjective: 74 yo F s/p cardiac arrest with unknown downtime. No further seizures with   Exam: Filed Vitals:   07/12/15 0754  BP:   Pulse:   Temp: 97.6 F (36.4 C)  Resp:    Gen: In bed, NAD Resp: non-labored breathing, no acute distress Abd: soft, nt  Neuro: MS: opens eyes to noxious stimuli.  OF:BPZWC, left corneal present, right absent.  Motor: flexion to noxious stimuli bilateral UE, flexion vs withdrawal in LE Sensory:as above  Pertinent Labs: Bmp - unremearkable  Impression: 74 yo F with liekly anoxic brain injury as evidenced by recurrent seizures cand continued encephaloapthy post arrest.   Recommendations: 1) EEG 2) will continue to follow.   Roland Rack, MD [facility] [phone]  If 7pm- 7am, please page neurology on call as listed in [place].

## 2015-07-12 NOTE — Care Management Note (Addendum)
Case Management Note  Patient Details  Name: [name] MRN: [id number] Date of Birth: 1941/08/22  Subjective/Objective:      Pt admitted after PEA arrest at home- found down by family              Action/Plan: Unknown at this time pt's prognosis- NCM to follow   Expected Discharge Date:                  Expected Discharge Plan: unknown at this time  In-House Referral:     Discharge planning Services  CM Consult  Post Acute Care Choice:    Choice offered to:     DME Arranged:    DME Agency:     HH Arranged:    [place] Agency:     Status of Service:  In process, will continue to follow  Medicare Important Message Given:    Date Medicare IM Given:    Medicare IM give by:    Date Additional Medicare IM Given:    Additional Medicare Important Message give by:     If discussed at [place] of Stay Meetings, dates discussed:    Additional Comments:  Dawayne Patricia, RN 07/12/2015, 10:41 AM

## 2015-07-12 NOTE — Progress Notes (Signed)
Echocardiogram 2D Echocardiogram has been performed.  [name] 07/12/2015, 4:00 PM

## 2015-07-12 NOTE — Progress Notes (Signed)
CRITICAL VALUE ALERT  Critical value received:  Trop 0.94  Date of notification:  07/12/15  Time of notification:  4276  Critical value read back:Yes.    Nurse who received alert:  Vista Lawman RN  MD notified (1st page):  Georgann Housekeeper NP at bedside  Time of first page:  [phone] **

## 2015-07-12 NOTE — Progress Notes (Signed)
Bedside EEG completed at 0930.  Results pending.

## 2015-07-12 NOTE — Progress Notes (Signed)
ms    Patient Name: [name] Date of Encounter: 07/12/2015  Active Problems:   Cardiac arrest   Primary Cardiologist: Dr Stanford Breed  Patient Profile: 74 yo female w/ hx LAD stent, nl EF by echo 2015, lupus, PE/DVT on Xarelto (per hematology), DM, HL, admitted 08/24 after PEA arrest, 20" CPR at home, QT 494 ms,   SUBJECTIVE: Unresponsive on the vent, sedation is off  OBJECTIVE Filed Vitals:   07/12/15 0600 07/12/15 0700 07/12/15 0742 07/12/15 0754  BP: 118/52 104/45 178/67   Pulse: 66 68 82   Temp:    97.6 F (36.4 C)  TempSrc:    Axillary  Resp: 22 20 30    Height:      Weight:      SpO2: 100% 98% 97%     Intake/Output Summary (Last 24 hours) at 07/12/15 0859 Last data filed at 07/12/15 0800  Gross per 24 hour  Intake 4530.73 ml  Output   2775 ml  Net 1755.73 ml   Filed Weights   07/14/2015 2047 06/28/2015 2230 07/12/15 0400  Weight: 196 lb 3.4 oz (89 kg) 208 lb 1.8 oz (94.4 kg) 208 lb 12.4 oz (94.7 kg)    PHYSICAL EXAM General: Well developed, well nourished, female unresponsive on the vent. Head: Normocephalic, atraumatic.  Neck: Supple without bruits, JVD not assessable due to pt position/equipment. Lungs:  Resp regular and unlabored, CTA. Heart: RRR, S1, S2, no S3, S4, or murmur; no rub. Abdomen: Soft, non-tender, non-distended, BS + x 4.  Extremities: No clubbing, cyanosis, edema.  Neuro: Unresponsive  LABS: CBC: Recent Labs  06/27/2015 1923 07/12/15 0446  WBC 14.9* 17.3*  NEUTROABS 11.5*  --   HGB 12.5 11.1*  HCT 39.1 34.1*  MCV 87.9 84.8  PLT 188 155   INR: Recent Labs  07/12/15 0030  INR 8.67*   Basic Metabolic Panel: Recent Labs  06/26/2015 1923 07/12/15 0030 07/12/15 0446  NA 139  --  141  K 3.8  --  3.7  CL 105  --  110  CO2 12*  --  21*  GLUCOSE 223*  --  184*  BUN 18  --  22*  CREATININE 1.19*  --  0.94  CALCIUM 8.6*  --  7.7*  MG  --  1.7 1.7  PHOS  --  2.7 2.9   Liver Function Tests: Recent Labs  07/10/2015 1923    AST 555*  ALT 522*  ALKPHOS 107  BILITOT 0.8  PROT 6.6  ALBUMIN 3.2*   Cardiac Enzymes: Recent Labs  07/05/2015 1923 07/16/2015 2132 07/12/15 0446  TROPONINI 0.04* 0.64* 0.94*   Fasting Lipid Panel: Recent Labs  07/12/15 0445  TRIG 71   TELE:  SR, S brady into the 40s. Rare non-conducted P waves    ECG: 08/25 SR, QT 494, QTc 529 08/24 QT 440, QTc 549 03/2015 QT 351, QTc 453  Radiology/Studies: Ct Head Wo Contrast 07/18/2015   CLINICAL DATA:  Found on the floor unresponsive. Stated history of coma. Post cardiac arrest.  EXAM: CT HEAD WITHOUT CONTRAST  TECHNIQUE: Contiguous axial images were obtained from the base of the skull through the vertex without intravenous contrast.  COMPARISON:  03/29/2015  FINDINGS: Ill-defined cortical hypodensity in the anterior right temporal lobe, with questionable small focus of cortical hypodensity in the left temporal lobe, may reflect subacute ischemia. No significant mass effect or midline shift. No intracranial hemorrhage. No cerebral edema. Atrophy, chronic small vessel ischemia, and remote lacunar infarct in the right  caudate, unchanged. No hydrocephalus. The basilar cisterns are patent. No intracranial fluid collection. Calvarium is intact. Minimal mucosal thickening of left side of sphenoid sinus, remaining paranasal sinuses and mastoid air cells are well aerated.  IMPRESSION: 1. Findings suspicious for acute/subacute ischemia involving the anterior right temporal lobe, and possibly left temporal lobe. 2. No intracranial hemorrhage or findings of diffuse cerebral edema. These results were called by telephone at the time of interpretation on 06/22/2015 at 96:22 pm to Dr. Roxanne Mins , who verbally acknowledged these results.   Electronically Signed   By: Jeb Levering M.D.   On: 06/22/2015 22:29   Dg Chest Port 1 View 07/12/2015   CLINICAL DATA:  Intubation.  EXAM: PORTABLE CHEST - 1 VIEW  COMPARISON:  07/12/2015.  FINDINGS: Endotracheal tube tip in the  orifice of the right mainstem bronchus. Proximal repositioning suggested. Left IJ line and NG tube in stable position. Cardiomegaly with bilateral pulmonary interstitial prominence and small bilateral effusions consistent with congestive heart failure. Pulmonary interstitial edema is increased from prior exam. Surgical clips over right chest.  IMPRESSION: 1. Endotracheal tube tip is in the orifice of the right mainstem bronchus. Proximal repositioning suggested. Remaining lines and tubes in stable position.  2. Cardiomegaly with diffuse bilateral pulmonary interstitial prominence consistent with pulmonary interstitial edema. Pulmonary interstitial edema has increased slightly from prior exam. Small bilateral pleural effusions.  Critical Value/emergent results were called by telephone at the time of interpretation on 07/12/2015 at 7:24 am to nurse Altamese Dilling, who verbally acknowledged these results.   Electronically Signed   By: Marcello Moores  Register   On: 07/12/2015 07:26   Dg Chest Port 1 View 07/12/2015   CLINICAL DATA:  Central line placement.  EXAM: PORTABLE CHEST - 1 VIEW  COMPARISON:  07/12/2015  FINDINGS: Endotracheal tube has been withdrawn. Now tip measures about 9 mm above the carina. Enteric tube tip is coiled in the left upper quadrant consistent with location in the stomach. Interval placement of a left central venous catheter with tip over the mid SVC region just beneath the SVC/brachiocephalic junction. No pneumothorax. Shallow inspiration. Cardiac enlargement. Mild pulmonary vascular congestion. Mild perihilar edema. No blunting of costophrenic angles.  IMPRESSION: Appliances appear to be in satisfactory position. Mild cardiac enlargement pulmonary vascular congestion with interstitial edema.   Electronically Signed   By: Lucienne Capers M.D.   On: 07/12/2015 03:17   Dg Chest Port 1 View 07/12/2015   CLINICAL DATA:  Intubation.  Shortness of breath.  EXAM: PORTABLE CHEST - 1 VIEW  COMPARISON:  07/16/2015   FINDINGS: Endotracheal tube is again positioned into the right mainstem bronchus. Retraction of about 2 cm is suggested. No change in appearance since prior study. Enteric tube is coiled in the left upper quadrant consistent with location in the stomach. Shallow inspiration. Cardiac enlargement. Pulmonary vascular congestion with probable perihilar edema. No blunting of costophrenic angles. No pneumothorax.  IMPRESSION: Appliances are unchanged in position. Endotracheal tube remains in the right mainstem bronchus and should be withdrawn 2 cm. Cardiac enlargement with pulmonary vascular congestion and perihilar edema.   Electronically Signed   By: Lucienne Capers M.D.   On: 07/12/2015 01:43   Dg Chest Port 1 View 06/30/2015   CLINICAL DATA:  Status post intubation.  EXAM: PORTABLE CHEST - 1 VIEW  COMPARISON:  03/29/2015  FINDINGS: The endotracheal tube tip is in the right mainstem bronchus. Recommend withdrawing by 1-1/2 to 2 cm. Normal heart size. Asymmetric opacity within the right upper lobe is  noted, nonspecific. No pleural effusion or edema.  IMPRESSION: 1. ET tube tip is in the right mainstem bronchus. Recommend withdrawal by 1-1/2 to 2 cm. 2. Right upper lobe airspace opacity. 3. These results were called by telephone at the time of interpretation on 07/16/2015 at 8:03 pm to Dr. Delora Fuel , who verbally acknowledged these results.   Electronically Signed   By: Kerby Moors M.D.   On: 06/21/2015 20:03   Current Medications:  . antiseptic oral rinse  7 mL Mouth Rinse 10 times per day  . chlorhexidine gluconate  15 mL Mouth Rinse BID  . insulin aspart  2-6 Units Subcutaneous 6 times per day  . levETIRAcetam  750 mg Intravenous BID  . linezolid  600 mg Intravenous Q12H  . pantoprazole (PROTONIX) IV  40 mg Intravenous QHS  . piperacillin-tazobactam (ZOSYN)  IV  3.375 g Intravenous 3 times per day   . sodium chloride 100 mL/hr at 07/12/15 0800  . heparin 1,000 Units/hr (07/12/15 0800)  . propofol  (DIPRIVAN) infusion Stopped (07/12/15 0730)    ASSESSMENT AND PLAN: Active Problems:   Cardiac arrest - PEA w/ at least 28" EMS CPR per reports, no shocks; Unknown down time prior to EMS arrival, had not been seen > 6 hr when found down.  - mild elevation in enzymes c/w this  - echo ordered - ECG w/ prolonged QT/QTc - some bradycardia and dropped beats, was on Toprol XL 50 mg qd PTA    VDRF - mgt per CCM    Possible Seizures - Neuro managing  Signed, Lenoard Aden 8:59 AM 07/12/2015  The patient was seen, examined and discussed with Rosaria Ferries, PA-C and I agree with the above.   74 year old female post PEA arrest, prolonged downtime and resuscitations, EEG results pending but the patient is not responding. She has minimal troponin elevation most probably sec to CPR. At this point there is not much we can offer, we will follow, if she improve neurologically we will proceed with further workup.   [name] 07/12/2015

## 2015-07-12 NOTE — Progress Notes (Signed)
40cc propofol wasted in sharps. Witnessed by Tommy Rainwater, RN

## 2015-07-12 NOTE — Progress Notes (Signed)
ANTIBIOTIC CONSULT NOTE - INITIAL  Pharmacy Consult for Linezolid, Zosyn, and Doxycycline Indication: sepsis  Allergies  Allergen Reactions  . Metformin Diarrhea  . Pioglitazone Other (See Comments)    weight gain  . Vancomycin Rash    Erythematous rash, hands and toes became cyanotic.      Patient Measurements: Height: 5\' 6"  (167.6 cm) Weight: 208 lb 1.8 oz (94.4 kg) IBW/kg (Calculated) : 59.3  Vital Signs: Temp: 100.6 F (38.1 C) (08/25 0003) Temp Source: Oral (08/25 0003) BP: 103/50 mmHg (08/25 0030) Pulse Rate: 87 (08/25 0030) Intake/Output from previous day: 08/24 0701 - 08/25 0700 In: 2235 [I.V.:2100; NG/GT:30; IV Piggyback:105] Out: 2130 [Urine:1275; Emesis/NG output:550] Intake/Output from this shift: Total I/O In: 2235 [I.V.:2100; NG/GT:30; IV Piggyback:105] Out: 1825 [Urine:1275; Emesis/NG output:550]  Labs:  Recent Labs  06/18/2015 1923  WBC 14.9*  HGB 12.5  PLT 188  CREATININE 1.19*   Estimated Creatinine Clearance: 47.3 mL/min (by C-G formula based on Cr of 1.19). No results for input(s): VANCOTROUGH, VANCOPEAK, VANCORANDOM, GENTTROUGH, GENTPEAK, GENTRANDOM, TOBRATROUGH, TOBRAPEAK, TOBRARND, AMIKACINPEAK, AMIKACINTROU, AMIKACIN in the last 72 hours.   Microbiology: No results found for this or any previous visit (from the past 720 hour(s)).  Medical History: Past Medical History  Diagnosis Date  . Hyperplastic colon polyp   . Diabetes mellitus type II   . Osteoarthritis   . GERD (gastroesophageal reflux disease)   . Osteopenia   . Lupus     of skin see dermatologist frequently  . OA (osteoarthritis of spine)     C-spine  . DVT (deep venous thrombosis)     a. right leg DVT 05-17-2008. b. h/o leg swelling of left leg 2-10, u/s showed a old clot. c. another DVT reported 07/2011.  . 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 Allergy   . Clotting disorder     hx of PE and DVT  . C. difficile diarrhea    hx of,   . Skin cancer     skin CA  . PONV (postoperative nausea and vomiting)   . Hypertension   . Breast cancer     left ductal breast ca dx 2003 approx, s/p XRt x 6 weeks, released from oncology (per  patient); dx right breast ca 2014 (in remission)  . Refusal of blood transfusions as patient is Jehov[employer]'s Witness   . CAD (coronary artery disease)     a. NSTEMI 07/2014: s/p BMS to mid-distal LAD.  . Ischemic cardiomyopathy     a. cath 07/22/2014 EF 35-40%; Echo 07/22/2013 EF 86-57%, grade 1 diastolic dysfunction, large area of apical akinesis.  Marland Kitchen HTN (hypertension)   . Dyslipidemia   . Transaminitis     a. Mild during 07/2014 admission ? due to MI  . Diabetes mellitus without complication   . Diastolic congestive heart failure     Medications:  Awaiting med rec  Assessment: 74 y.o. female presented s/p PEA arrest at home. To begin broad spectrum antibiotics (Linezolid, Doxy, and Zosyn) for sepsis- ?asp pna. Cultures pending. Noted pt with allergy to Vancomycin - reported as rash and toes became cyanotic. LA 8.8. WBC elevated to 14.9. Hypothermic to Tm 100.6. SCr 1.19, est CrCl 47 ml/min.  Goal of Therapy:  Resolution of infection  Plan:  Linezolid 600mg  IV q12h Zosyn 3.375gm IV q8h - first dose over 30 min then subsequent doses over 4 hours Doxycycline 100mg  IV q12h Will f/u micro data, renal function, and pt's  clinical condition  Sherlon Handing, PharmD, BCPS Clinical pharmacist, pager [phone] 07/12/2015,12:57 AM

## 2015-07-12 NOTE — Progress Notes (Signed)
CRITICAL VALUE ALERT  Critical value received:  Trop 0.64  Date of notification:  07/12/15  Time of notification:  0201  Critical value read back:Yes.    Nurse who received alert:  Vista Lawman, RN  MD notified (1st page):  Georgann Housekeeper, NP at bedside

## 2015-07-13 ENCOUNTER — Inpatient Hospital Stay (HOSPITAL_COMMUNITY): Payer: PPO

## 2015-07-13 ENCOUNTER — Encounter (HOSPITAL_COMMUNITY): Payer: Self-pay

## 2015-07-13 DIAGNOSIS — G931 Anoxic brain damage, not elsewhere classified: Secondary | ICD-10-CM | POA: Insufficient documentation

## 2015-07-13 DIAGNOSIS — J9600 Acute respiratory failure, unspecified whether with hypoxia or hypercapnia: Secondary | ICD-10-CM

## 2015-07-13 LAB — APTT: aPTT: 66 seconds — ABNORMAL HIGH (ref 24–37)

## 2015-07-13 LAB — GLUCOSE, CAPILLARY
GLUCOSE-CAPILLARY: 179 mg/dL — AB (ref 65–99)
GLUCOSE-CAPILLARY: 116 mg/dL — AB (ref 65–99)
Glucose-Capillary: 162 mg/dL — ABNORMAL HIGH (ref 65–99)
Glucose-Capillary: 186 mg/dL — ABNORMAL HIGH (ref 65–99)
Glucose-Capillary: 184 mg/dL — ABNORMAL HIGH (ref 65–99)

## 2015-07-13 LAB — COMPREHENSIVE METABOLIC PANEL
ALK PHOS: 64 U/L (ref 38–126)
ALT: 210 U/L — AB (ref 14–54)
AST: 131 U/L — AB (ref 15–41)
Albumin: 2.3 g/dL — ABNORMAL LOW (ref 3.5–5.0)
Anion gap: 7 (ref 5–15)
BILIRUBIN TOTAL: 1.3 mg/dL — AB (ref 0.3–1.2)
BUN: 16 mg/dL (ref 6–20)
CALCIUM: 7.7 mg/dL — AB (ref 8.9–10.3)
CO2: 21 mmol/L — ABNORMAL LOW (ref 22–32)
CREATININE: 0.94 mg/dL (ref 0.44–1.00)
Chloride: 111 mmol/L (ref 101–111)
GFR, EST NON AFRICAN AMERICAN: 58 mL/min — AB (ref >60)
Glucose, Bld: 183 mg/dL — ABNORMAL HIGH (ref 65–99)
Potassium: 3.4 mmol/L — ABNORMAL LOW (ref 3.5–5.1)
Sodium: 139 mmol/L (ref 135–145)
Total Protein: 5.1 g/dL — ABNORMAL LOW (ref 6.5–8.1)

## 2015-07-13 LAB — CBC
HEMATOCRIT: 32.8 % — AB (ref 36.0–46.0)
HEMOGLOBIN: 10.7 g/dL — AB (ref 12.0–15.0)
MCH: 27.3 pg (ref 26.0–34.0)
MCHC: 32.6 g/dL (ref 30.0–36.0)
MCV: 83.7 fL (ref 78.0–100.0)
Platelets: 145 10*3/uL — ABNORMAL LOW (ref 150–400)
RBC: 3.92 MIL/uL (ref 3.87–5.11)
RDW: 14.2 % (ref 11.5–15.5)
WBC: 12.5 10*3/uL — ABNORMAL HIGH (ref 4.0–10.5)

## 2015-07-13 LAB — HEPARIN LEVEL (UNFRACTIONATED): Heparin Unfractionated: 0.49 IU/mL (ref 0.30–0.70)

## 2015-07-13 MED ORDER — DEXTROSE 5 % IV SOLN
1000.0000 mg | Freq: Once | INTRAVENOUS | Status: AC
  Administered 2015-07-13: 1000 mg via INTRAVENOUS
  Filled 2015-07-13: qty 10

## 2015-07-13 MED ORDER — LORAZEPAM 2 MG/ML IJ SOLN
2.0000 mg | Freq: Once | INTRAMUSCULAR | Status: AC
  Administered 2015-07-13: 2 mg via INTRAVENOUS

## 2015-07-13 MED ORDER — PANTOPRAZOLE SODIUM 40 MG PO PACK
40.0000 mg | PACK | Freq: Every day | ORAL | Status: DC
Start: 2015-07-13 — End: 2015-07-14
  Administered 2015-07-13: 40 mg
  Filled 2015-07-13 (×2): qty 20

## 2015-07-13 MED ORDER — LACOSAMIDE 200 MG/20ML IV SOLN
200.0000 mg | Freq: Once | INTRAVENOUS | Status: AC
  Administered 2015-07-13: 200 mg via INTRAVENOUS
  Filled 2015-07-13: qty 20

## 2015-07-13 MED ORDER — POTASSIUM CHLORIDE 10 MEQ/50ML IV SOLN
10.0000 meq | INTRAVENOUS | Status: AC
  Administered 2015-07-13 (×2): 10 meq via INTRAVENOUS
  Filled 2015-07-13 (×2): qty 50

## 2015-07-13 MED ORDER — LORAZEPAM 2 MG/ML IJ SOLN
2.0000 mg | Freq: Once | INTRAMUSCULAR | Status: AC
  Administered 2015-07-13: 2 mg via INTRAVENOUS
  Filled 2015-07-13: qty 1

## 2015-07-13 MED ORDER — VALPROATE SODIUM 500 MG/5ML IV SOLN
500.0000 mg | Freq: Three times a day (TID) | INTRAVENOUS | Status: DC
  Administered 2015-07-13 – 2015-07-15 (×7): 500 mg via INTRAVENOUS
  Filled 2015-07-13 (×12): qty 5

## 2015-07-13 MED ORDER — SODIUM CHLORIDE 0.9 % IV SOLN
1500.0000 mg | Freq: Two times a day (BID) | INTRAVENOUS | Status: DC
  Administered 2015-07-13 – 2015-07-15 (×4): 1500 mg via INTRAVENOUS
  Filled 2015-07-13 (×6): qty 15

## 2015-07-13 NOTE — Progress Notes (Signed)
Patient continues to have seizures despite 2 antiepileptics. I will add a third antiepileptics, Vimpat. I do not think that given that the patient is in status epilepticus proceeding with general anesthesia would be at all likely to improve her outcome and do not think that this is indicated at this time. We'll continue to try and treat seizures with antiepileptics medications.  Roland Rack, MD [facility] [phone]  If 7pm- 7am, please page neurology on call as listed in [place].

## 2015-07-13 NOTE — Progress Notes (Signed)
PULMONARY / CRITICAL CARE MEDICINE   Name: [name] MRN: [id number] DOB: Mar 05, 1941    ADMISSION DATE:  06/19/2015 CONSULTATION DATE:  06/23/2015  REFERRING MD :  EDP Dr Roxanne Mins  CHIEF COMPLAINT:  Cardiac arrest  INITIAL PRESENTATION: 74 year old female, Jehova's witness, presented to [facility] ED 8/24 in progress PEA arrest, prolonged downtime. 40 mins ACLS required for ROSC, however, unknown downtime prior to that. As many as several hours. PCCM to admit.   STUDIES:  CT head 8/24 >>>Findings suspicious for acute/subacute ischemia involving the anterior right temporal lobe, and possibly left temporal lobe. 8/25 EEG >> consistent with a severe generalized dysfunction with potential epileptogenicity from the central regions. 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  8/25 duplex neg 8/26 head CT neg  SIGNIFICANT EVENTS:   HISTORY OF PRESENT ILLNESS:  74 year old female with PMH as below, which includes DM, CAD, DVT/PE on xarelto, SLE, Breast Ca, seizures, and ischemic cardiomyopathy. 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 Has recently been admitted for urosepsis. 8/24 she was last seen normal at about 12 noon. Her family left her for several hours, and returned home at 1830 to find her unresponsive. EMS was called, and upon their arrival she was noted to be in PEA. She received 20 mins of ACLS prior to ROSC. She was intubated in route and transported to ED. In ED she suffered another brief PEA arrest of about 4 minutes. Initially post arrest she was completely unresponsive with GCS 3, however, she has developed some posturing since that time. PCCM has been asked to admit. Of note there is questionable seizure activity a year ago, and possibly the day before the arrest, however, grandsons who are reporting this are unclear of events.    SUBJECTIVE:Afebrile Remains unresponsive, critically ill Good UO   VITAL SIGNS: Temp:  [97.9 F (36.6 C)-99.2 F  (37.3 C)] 98.8 F (37.1 C) (08/26 0800) Pulse Rate:  [61-114] 72 (08/26 0800) Resp:  [0-29] 20 (08/26 0800) BP: (105-177)/(36-70) 146/50 mmHg (08/26 0800) SpO2:  [91 %-100 %] 98 % (08/26 0800) Arterial Line BP: (115-292)/(37-61) 170/51 mmHg (08/26 0800) FiO2 (%):  [40 %-50 %] 40 % (08/26 0311) Weight:  [211 lb 3.2 oz (95.8 kg)] 211 lb 3.2 oz (95.8 kg) (08/26 0338) HEMODYNAMICS: CVP:  [5 mmHg-8 mmHg] 5 mmHg VENTILATOR SETTINGS: Vent Mode:  [-] PRVC FiO2 (%):  [40 %-50 %] 40 % Set Rate:  [20 bmp] 20 bmp Vt Set:  [500 mL] 500 mL PEEP:  [5 cmH20] 5 cmH20 Plateau Pressure:  [18 cmH20-22 cmH20] 18 cmH20 INTAKE / OUTPUT:  Intake/Output Summary (Last 24 hours) at 07/13/15 0905 Last data filed at 07/13/15 0700  Gross per 24 hour  Intake 2267.5 ml  Output   1225 ml  Net 1042.5 ml    PHYSICAL EXAMINATION: General:  Elderly female in no distress on vent Neuro:  GCS 3 , comatose, no response to DPS,pupils 26mm BERTL, upward gaze, plantars Bl upgoing, occasional quivering of lips & eyelids per RN HEENT:  [place]/AT, no JVD noted Cardiovascular:  RRR, no MRG Lungs:  Clear bilateral breath sounds Abdomen:  Soft, non-distended Musculoskeletal:  No acute deformity or edema Skin:  Grossly intact  LABS:  CBC  Recent Labs Lab 06/30/2015 1923 07/12/15 0446 07/13/15 0330  WBC 14.9* 17.3* 12.5*  HGB 12.5 11.1* 10.7*  HCT 39.1 34.1* 32.8*  PLT 188 155 145*   Coag's  Recent Labs Lab  07/12/15 0030 07/12/15 1300 07/13/15 0330  APTT 33 100* 66*  INR 1.53*  --   --    BMET  Recent Labs Lab 07/14/2015 1923 07/12/15 0446 07/13/15 0330  NA 139 141 139  K 3.8 3.7 3.4*  CL 105 110 111  CO2 12* 21* 21*  BUN 18 22* 16  CREATININE 1.19* 0.94 0.94  GLUCOSE 223* 184* 183*   Electrolytes  Recent Labs Lab 06/24/2015 1923 07/12/15 0030 07/12/15 0446 07/13/15 0330  CALCIUM 8.6*  --  7.7* 7.7*  MG  --  1.7 1.7  --   PHOS  --  2.7 2.9  --    Sepsis Markers  Recent Labs Lab  06/24/2015 1916 07/12/15 0045 07/12/15 0447  LATICACIDVEN 8.8* 5.0* 1.9   ABG  Recent Labs Lab 07/03/2015 2352 07/12/15 0405  PHART 7.177* 7.392  PCO2ART 51.2* 33.0*  PO2ART >450* 179.0*   Liver Enzymes  Recent Labs Lab 07/18/2015 1923 07/13/15 0330  AST 555* 131*  ALT 522* 210*  ALKPHOS 107 64  BILITOT 0.8 1.3*  ALBUMIN 3.2* 2.3*   Cardiac Enzymes  Recent Labs Lab 07/14/2015 2132 07/12/15 0446 07/12/15 1000  TROPONINI 0.64* 0.94* 0.54*   Glucose  Recent Labs Lab 07/12/15 1145 07/12/15 1631 07/12/15 1915 07/12/15 2339 07/13/15 0326 07/13/15 0812  GLUCAP 155* 132* 134* 122* 162* 186*    Imaging Ct Head Wo Contrast  07/13/2015   CLINICAL DATA:  Follow-up examination for anoxic brain injury.  EXAM: CT HEAD WITHOUT CONTRAST  TECHNIQUE: Contiguous axial images were obtained from the base of the skull through the vertex without intravenous contrast.  COMPARISON:  Prior CT from 06/25/2015.  FINDINGS: Diffuse prominence of the CSF containing spaces compatible with generalized cerebral atrophy. Chronic microvascular ischemic changes present within the periventricular and deep white matter. Remote lacunar infarct present within the right basal ganglia/corona radiata. Additional remote lacunar infarct within the subcortical white matter of the left corona radiata. Intracranial atherosclerosis noted.  No acute large vessel territory infarct. Gray-white matter differentiation maintained. Deep brain nuclei symmetric and within normal limits. No definite evidence for anoxic brain injury identified.  No intracranial hemorrhage. No mass lesion, midline shift or mass effect. No hydrocephalus. No extra-axial fluid collection.  Scalp soft tissues within normal limits. No acute abnormality about the orbits.  Calvarium intact. Scattered mucosal thickening within the ethmoidal air cells. Small amount of layering opacity within the left sphenoid sinus. Paranasal sinuses are otherwise clear. Fluid  within the posterior nasopharynx. Minimal opacity within the bilateral mastoid air cells. Middle ear cavities are clear. Patient is intubated.  IMPRESSION: 1. No evidence for anoxic brain injury identified on this noncontrast CT examination. No other acute intracranial process. 2. Age-related cerebral atrophy with chronic microvascular ischemic disease and remote lacunar infarcts as above. 3. Intracranial atherosclerosis.   Electronically Signed   By: Jeannine Boga M.D.   On: 07/13/2015 06:39   Dg Chest Port 1 View  07/13/2015   CLINICAL DATA:  Acute respiratory failure, cardiac arrest  EXAM: PORTABLE CHEST - 1 VIEW  COMPARISON:  Portable chest x-ray of July 12, 2015  FINDINGS: The lungs are well-expanded. The interstitial markings have become more conspicuous bilaterally. Confluent perihilar density is present especially on the right. The right hemidiaphragm is now obscured in there is persistent obscuration of the left hemidiaphragm. The esophago gastric tube tip projects in the gastric cardia with the proximal port at the level of the GE junction. The endotracheal tube tip lies approximately 2.5  cm above the carina. The left internal jugular venous catheter tip projects over the proximal SVC.  IMPRESSION: 1. Worsening of pulmonary interstitial edema and small bilateral pleural effusions. 2. The endotracheal tube tip has been withdrawn such that it now lies 2.5 cm above the carina. 3. Advancement of the nasogastric tube by 10 cm would assure that the proximal port remains below the level of the GE junction.   Electronically Signed   By: David  Martinique M.D.   On: 07/13/2015 07:34     ASSESSMENT / PLAN:  PULMONARY OETT 8/24 >>> A: Acute respiratory failure in setting cardiac arrest  P:   Full vent support VAP bundle   CARDIOVASCULAR CVL 8/24 >>> A:  PEA arrest CAD Ischemic cardiomyopathy H/o HTN Prolonged QTc on EKG 8/25 Echo >> nml LV fn P:  MAP goal > 65 mm/Hg    RENAL A:    hypokalemia  P: replete lytes as needed   GASTROINTESTINAL A:  GERD Transaminitis > suspect shock liver, LFTs trending down  P:   NPO PPI   HEMATOLOGIC A:   H/o DVT/PE on xarelto Duplex neg P:  Hold xarelto Ct heparin gtt    INFECTIOUS A:   ? Aspiration PNA  P:   BCx2 8/24 >>> UC 8/24 >>> Sputum 8/24 >>> Abx: Zosyn, start date 8/24 >>> Abx: Linezolid start date 8/24 >>> Abx: Doxy, start date 8/24 >>>8/25 UA pending  ENDOCRINE A:   DM  P:   CBG and SSI  NEUROLOGIC A:   Acute anoxic encephalopathy - no evidence of CVA or edema on imaging Post anoxic seizures  P:   RASS goal: 0 Fentanyl prn  Rpt EEG for seizure 8/26 Consulted neurology - ct keppra, added depakote   FAMILY  - Updates: 8/26  Discussed with family incl 2 daughters . Updated about her mothers status and that prognosis of neurologic recovery is extremely guarded.DNR - plan for withdrawal after neuro prognostication at 79h  - Inter-disciplinary family meet or Palliative Care meeting due by:  done   The patient is critically ill with multiple organ systems failure and requires high complexity decision making for assessment and support, frequent evaluation and titration of therapies, application of advanced monitoring technologies and extensive interpretation of multiple databases. Critical Care Time devoted to patient care services described in this note independent of APP time is 35 minutes.   Kara Mead MD. [place] [facility]. [facility] Pager [phone] If no response call [phone]    07/13/2015 9:05 AM

## 2015-07-13 NOTE — Progress Notes (Signed)
EEG completed; results pending.  L. Daryl Atheena Spano, R.EEGT.  

## 2015-07-13 NOTE — Procedures (Addendum)
History: 74 year old female status post cardiac arrest with seizures  Sedation: None  Technique: This is a 19 channel routine scalp EEG performed at the bedside with bipolar and monopolar montages arranged in accordance to the international 10/20 system of electrode placement. One channel was dedicated to EKG recording.    Background: At onset of recording there are generalized epileptiform discharges with a bifrontal predominance that are continuous ranging from 5-9 Hz. There are continuous with the exception of occasional brief 1-2 second periods of suppression. Following 2 mg Ativan administration there is marked improvement in these discharges. In following an additional 2 mg, and no longer appears that she is seizing, but she continues to have frequent intermittent brief trains of epileptiform discharges.  Photic stimulation: Physiologic driving is not performed  EEG Abnormalities: 1) seizure with resolution following Ativan administration  Clinical Interpretation: This EEG is consistent with generalized seizure which resolved with Ativan administration.  Roland Rack, MD [facility] [phone]  If 7pm- 7am, please page neurology on call as listed in [place].

## 2015-07-13 NOTE — Plan of Care (Signed)
Problem: Acute Treatment Outcomes Goal: 02 Sats > 94% Outcome: Completed/Met Date Met:  07/13/15 Pt on ventilator Goal: Prognosis discussed with family/patient as appropriate Outcome: Completed/Met Date Met:  07/13/15 Family meeting with daughters, prognosis and status discussed. Pt made DNR, decision on further care delayed.

## 2015-07-13 NOTE — Progress Notes (Signed)
[facility] ADULT ICU REPLACEMENT PROTOCOL FOR AM LAB REPLACEMENT ONLY  The patient does apply for the [facility] Adult ICU Electrolyte Replacment Protocol based on the criteria listed below:   1. Is GFR >/= 40 ml/min? Yes.    Patient's GFR today is 58 2. Is urine output >/= 0.5 ml/kg/hr for the last 6 hours? Yes.   Patient's UOP is 0.5 ml/kg/hr 3. Is BUN < 60 mg/dL? Yes.    Patient's BUN today is 16 4. Abnormal electrolyte(s): K3.4 5. Ordered repletion with: per protocol 6. If a panic level lab has been reported, has the CCM MD in charge been notified? Yes.  .   Physician:  E Deterding,MD  [place] 07/13/2015 5:55 AM

## 2015-07-13 NOTE — Progress Notes (Signed)
ANTICOAGULATION CONSULT NOTE - Follow Up Consult  Pharmacy Consult for Heparin bridge Indication:  PE history (previously on Xarelto)  Allergies  Allergen Reactions  . Metformin Diarrhea  . Pioglitazone Other (See Comments)    weight gain  . Vancomycin Rash    Erythematous rash, hands and toes became cyanotic.      Labs:  Recent Labs  07/04/2015 1923 07/07/2015 2132 07/12/15 0030 07/12/15 0446 07/12/15 1000 07/12/15 1300 07/13/15 0330  HGB 12.5  --   --  11.1*  --   --  10.7*  HCT 39.1  --   --  34.1*  --   --  32.8*  PLT 188  --   --  155  --   --  145*  APTT  --   --  33  --   --  100* 66*  LABPROT  --   --  18.4*  --   --   --   --   INR  --   --  1.53*  --   --   --   --   HEPARINUNFRC  --   --  0.28*  --   --  0.62 0.49  CREATININE 1.19*  --   --  0.94  --   --  0.94  TROPONINI 0.04* 0.64*  --  0.94* 0.54*  --   --     Estimated Creatinine Clearance: 60.3 mL/min (by C-G formula based on Cr of 0.94).    Assessment: 74yo female with history of CAD and PE/DVT on xarelto PTA presents with PEA arrest. He is noted with VDRF and acute anoxic encephalopathy and concern for seizures on keppra and depakote.   Heparin level = 0.49, PTT = 66 seconds.   Goal of Therapy:  Heparin level 0.3-0.7 units/ml Monitor platelets by anticoagulation protocol: Yes  PTT = 66 to 102 seconds   Plan:  -No heparin changes needed -Daily heparin level and CBC -Will d/c daily aPTTs

## 2015-07-13 NOTE — Care Management Important Message (Signed)
Important Message  Patient Details  Name: [name] MRN: [id number] Date of Birth: [birth year]   Medicare Important Message Given:  Yes-second notification given    Sharin Mons, RN 07/13/2015, 1:54 PM

## 2015-07-13 NOTE — Progress Notes (Signed)
Subjective: Continued concern for intermittent facial twitching concerning for seizure.   Exam: Filed Vitals:   07/13/15 0800  BP: 146/50  Pulse: 72  Temp: 98.8 F (37.1 C)  Resp: 20   Gen: In bed, NAD Resp: non-labored breathing, no acute distress Abd: soft, nt  Neuro: MS: opens eyes to noxious stimuli.  TA:VWPVX, corneals present bilaterally Motor: flexion to noxious stimuli bilateral UE, flexion vs withdrawal in LE Sensory:as above  Pertinent Labs: Bmp - unremearkable  Impression: 74 yo F with liekly anoxic brain injury as evidenced by recurrent seizures cand continued encephaloapthy post arrest. EEG showed marked irritability, she has been started on depakote and keppra. She has had continued concern for seizure which is not surpising given the frequent sharp waves seen on EEG.   She likely has had significant anoxic injury, but unclear to what extent at this time.   Recommendations: 1) repeat EEG 2) Will give additional depakote dose.  3) will continue to follow.   Roland Rack, MD [facility] [phone]  If 7pm- 7am, please page neurology on call as listed in [place].

## 2015-07-14 ENCOUNTER — Inpatient Hospital Stay (HOSPITAL_COMMUNITY): Payer: PPO

## 2015-07-14 LAB — BASIC METABOLIC PANEL
ANION GAP: 4 — AB (ref 5–15)
BUN: 16 mg/dL (ref 6–20)
CHLORIDE: 113 mmol/L — AB (ref 101–111)
CO2: 25 mmol/L (ref 22–32)
Calcium: 8 mg/dL — ABNORMAL LOW (ref 8.9–10.3)
Creatinine, Ser: 0.87 mg/dL (ref 0.44–1.00)
GFR calc non Af Amer: >60 mL/min (ref >60)
Glucose, Bld: 174 mg/dL — ABNORMAL HIGH (ref 65–99)
Potassium: 3.6 mmol/L (ref 3.5–5.1)
Sodium: 142 mmol/L (ref 135–145)

## 2015-07-14 LAB — GLUCOSE, CAPILLARY
GLUCOSE-CAPILLARY: 157 mg/dL — AB (ref 65–99)
GLUCOSE-CAPILLARY: 140 mg/dL — AB (ref 65–99)
GLUCOSE-CAPILLARY: 155 mg/dL — AB (ref 65–99)
Glucose-Capillary: 146 mg/dL — ABNORMAL HIGH (ref 65–99)
Glucose-Capillary: 147 mg/dL — ABNORMAL HIGH (ref 65–99)
Glucose-Capillary: 148 mg/dL — ABNORMAL HIGH (ref 65–99)

## 2015-07-14 LAB — CBC
HEMATOCRIT: 30.3 % — AB (ref 36.0–46.0)
HEMOGLOBIN: 9.7 g/dL — AB (ref 12.0–15.0)
MCH: 27.2 pg (ref 26.0–34.0)
MCHC: 32 g/dL (ref 30.0–36.0)
MCV: 85.1 fL (ref 78.0–100.0)
Platelets: 134 10*3/uL — ABNORMAL LOW (ref 150–400)
RBC: 3.56 MIL/uL — ABNORMAL LOW (ref 3.87–5.11)
RDW: 14.3 % (ref 11.5–15.5)
WBC: 9.8 10*3/uL (ref 4.0–10.5)

## 2015-07-14 LAB — CULTURE, RESPIRATORY

## 2015-07-14 LAB — URINE CULTURE: Culture: 20000

## 2015-07-14 LAB — HEPARIN LEVEL (UNFRACTIONATED): HEPARIN UNFRACTIONATED: 0.3 [IU]/mL (ref 0.30–0.70)

## 2015-07-14 MED ORDER — PANTOPRAZOLE SODIUM 40 MG IV SOLR
40.0000 mg | Freq: Every day | INTRAVENOUS | Status: DC
  Administered 2015-07-14 – 2015-07-15 (×2): 40 mg via INTRAVENOUS
  Filled 2015-07-14 (×2): qty 40

## 2015-07-14 MED ORDER — POTASSIUM CHLORIDE 10 MEQ/50ML IV SOLN
10.0000 meq | INTRAVENOUS | Status: AC
  Administered 2015-07-14 (×2): 10 meq via INTRAVENOUS
  Filled 2015-07-14 (×2): qty 50

## 2015-07-14 MED ORDER — SODIUM CHLORIDE 0.9 % IV SOLN
100.0000 mg | Freq: Two times a day (BID) | INTRAVENOUS | Status: DC
  Administered 2015-07-14 – 2015-07-15 (×3): 100 mg via INTRAVENOUS
  Filled 2015-07-14 (×6): qty 10

## 2015-07-14 NOTE — Progress Notes (Signed)
ANTICOAGULATION CONSULT NOTE - Follow Up Consult  Pharmacy Consult for heparin Indication: Hx PE, on Xarelto pta  Allergies  Allergen Reactions  . Metformin Diarrhea  . Pioglitazone Other (See Comments)    weight gain  . Vancomycin Rash    Erythematous rash, hands and toes became cyanotic.      Patient Measurements: Height: 5\' 6"  (167.6 cm) Weight: 206 lb 2.1 oz (93.5 kg) IBW/kg (Calculated) : 59.3 Heparin Dosing Weight:   Vital Signs: Temp: 98.8 F (37.1 C) (08/27 0745) Temp Source: Axillary (08/27 0745) BP: 178/50 mmHg (08/27 0735) Pulse Rate: 63 (08/27 0800)  Labs:  Recent Labs  07/05/2015 2132  07/12/15 0030 07/12/15 0446 07/12/15 1000 07/12/15 1300 07/13/15 0330 07/14/15 0330  HGB  --   --   --  11.1*  --   --  10.7* 9.7*  HCT  --   --   --  34.1*  --   --  32.8* 30.3*  PLT  --   --   --  155  --   --  145* 134*  APTT  --   --  33  --   --  100* 66*  --   LABPROT  --   --  18.4*  --   --   --   --   --   INR  --   --  1.53*  --   --   --   --   --   HEPARINUNFRC  --   < > 0.28*  --   --  0.62 0.49 0.30  CREATININE  --   --   --  0.94  --   --  0.94 0.87  TROPONINI 0.64*  --   --  0.94* 0.54*  --   --   --   < > = values in this interval not displayed.  Estimated Creatinine Clearance: 64.4 mL/min (by C-G formula based on Cr of 0.87).   Medications:  Scheduled:  . antiseptic oral rinse  7 mL Mouth Rinse 10 times per day  . chlorhexidine gluconate  15 mL Mouth Rinse BID  . insulin aspart  2-6 Units Subcutaneous 6 times per day  . levETIRAcetam  1,500 mg Intravenous BID  . pantoprazole sodium  40 mg Per Tube Q1200  . piperacillin-tazobactam (ZOSYN)  IV  3.375 g Intravenous 3 times per day  . valproate sodium  500 mg Intravenous 3 times per day    Assessment: 74yo with hx CAD and PE, on Xarelto pta before presenting with PEA arrest.  Pt with acute anoxic encephalopathy and continued seizures, with Vimpat added to Keppra and Valproic Acid on 8/26.  Heparin  level remains therapeutic on 1000 units/hr, but has consistently drifted down on this rate.  Hg and pltc are decr'd, watching.  No bleeding noted.  Goal of Therapy:  Heparin level 0.3-0.7 units/ml Monitor platelets by anticoagulation protocol: Yes   Plan:  Increase heparin to 1100 units/hr Watch for s/s of bleeding F/U in AM  Gracy Bruins, [facility]

## 2015-07-14 NOTE — Progress Notes (Signed)
K+= 3.6, creat= 0.87 and GFR >60 w/ urine o/p > 30cc/hr; eCCM electrolyte protocol initiated with 10 mEq KCL in 50cc IV x 2, each over one hour.

## 2015-07-14 NOTE — Progress Notes (Addendum)
Subjective: Single seizure overnight.   Exam: Filed Vitals:   07/14/15 0800  BP:   Pulse: 63  Temp:   Resp: 18   Gen: In bed, NAD Resp: non-labored breathing, no acute distress Abd: soft, nt  Neuro: MS: minimal eye opening.  [id number], corneals intact Motor: flexion bilaterally left. Withdraw vs flexion bilateral lower extremities.   Pertinent Labs: Bmp - gluc 174  Impression: 73 yo  F with anoxic brain injury and refracotry post-anoxic seizures. She is currently on three antiepileptics. I do not think that aggressive treatment such as general anesthesia would be at all likely to improve outcome. I feel that with the refractory nature of these seizures and her current exam, she has likely had a devestating cerebral injury.   None of the clinical markers that we would typically look for at 72 hours to say this with 601% certainty, but especially in someone of her age, I think the cahnce of recovery to independent function is small.   Recommendations: 1) Continue vimpat, depakote, keppra.  2) MRI brain  Roland Rack, MD [facility] [phone]  If 7pm- 7am, please page neurology on call as listed in [place].

## 2015-07-14 NOTE — Progress Notes (Signed)
PULMONARY / CRITICAL CARE MEDICINE   Name: [name] MRN: [id number] DOB: November 27, 1940    ADMISSION DATE:  07/10/2015 CONSULTATION DATE:  07/10/2015  REFERRING MD :  EDP Dr Roxanne Mins  CHIEF COMPLAINT:  Cardiac arrest  INITIAL PRESENTATION: 74 year old female, Jehova's witness, presented to [facility] ED 8/24 in progress PEA arrest, prolonged downtime. 40 mins ACLS required for ROSC, however, unknown downtime prior to that. As many as several hours. PCCM to admit.   STUDIES:  CT head 8/24 >>>Findings suspicious for acute/subacute ischemia involving the anterior right temporal lobe, and possibly left temporal lobe. 8/25 EEG >> consistent with a severe generalized dysfunction with potential epileptogenicity from the central regions. 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  8/25 duplex neg 8/26 head CT neg  SIGNIFICANT EVENTS: Currently in 24 of 72 hr observation period for possible WD care.  HISTORY OF PRESENT ILLNESS:  74 year old female with PMH as below, which includes DM, CAD, DVT/PE on xarelto, SLE, Breast Ca, seizures, and ischemic cardiomyopathy. 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 Has recently been admitted for urosepsis. 8/24 she was last seen normal at about 12 noon. Her family left her for several hours, and returned home at 1830 to find her unresponsive. EMS was called, and upon their arrival she was noted to be in PEA. She received 20 mins of ACLS prior to ROSC. She was intubated in route and transported to ED. In ED she suffered another brief PEA arrest of about 4 minutes. Initially post arrest she was completely unresponsive with GCS 3, however, she has developed some posturing since that time. PCCM has been asked to admit. Of note there is questionable seizure activity a year ago, and possibly the day before the arrest, however, grandsons who are reporting this are unclear of events.    [place] unresponsive, critically  ill Good UO   VITAL SIGNS: Temp:  [98.5 F (36.9 C)-99.7 F (37.6 C)] 98.8 F (37.1 C) (08/27 0745) Pulse Rate:  [55-88] 63 (08/27 0800) Resp:  [14-52] 18 (08/27 0800) BP: (99-178)/(34-52) 178/50 mmHg (08/27 0700) SpO2:  [95 %-100 %] 99 % (08/27 0800) Arterial Line BP: (116-233)/(50-91) 204/58 mmHg (08/27 0800) FiO2 (%):  [40 %] 40 % (08/27 0800) Weight:  [206 lb 2.1 oz (93.5 kg)] 206 lb 2.1 oz (93.5 kg) (08/27 0500) HEMODYNAMICS:   VENTILATOR SETTINGS: Vent Mode:  [-] PRVC FiO2 (%):  [40 %] 40 % Set Rate:  [15 bmp] 15 bmp Vt Set:  [500 mL] 500 mL PEEP:  [5 cmH20] 5 cmH20 Plateau Pressure:  [18 cmH20-19 cmH20] 18 cmH20 INTAKE / OUTPUT:  Intake/Output Summary (Last 24 hours) at 07/14/15 0842 Last data filed at 07/14/15 0800  Gross per 24 hour  Intake 2172.5 ml  Output   1430 ml  Net  742.5 ml    PHYSICAL EXAMINATION: General:  Elderly female in no distress on vent Neuro:  GCS 3 , comatose, no response to DPS,pupils 57mm BERTL, upward gaze, plantars Bl upgoing, occasional quivering of lips & eyelids per RN HEENT:  [place]/AT, no JVD noted Cardiovascular:  RRR, no MRG Lungs:  Clear bilateral breath sounds Abdomen:  Soft, non-distended Musculoskeletal:  No acute deformity or edema Skin:  Grossly intact, warm  LABS:  CBC  Recent Labs Lab 07/12/15 0446 07/13/15 0330 07/14/15 0330  WBC 17.3* 12.5* 9.8  HGB 11.1* 10.7* 9.7*  HCT 34.1* 32.8* 30.3*  PLT 155 145* 134*  Coag's  Recent Labs Lab 07/12/15 0030 07/12/15 1300 07/13/15 0330  APTT 33 100* 66*  INR 1.53*  --   --    BMET  Recent Labs Lab 07/12/15 0446 07/13/15 0330 07/14/15 0330  NA 141 139 142  K 3.7 3.4* 3.6  CL 110 111 113*  CO2 21* 21* 25  BUN 22* 16 16  CREATININE 0.94 0.94 0.87  GLUCOSE 184* 183* 174*   Electrolytes  Recent Labs Lab 07/12/15 0030 07/12/15 0446 07/13/15 0330 07/14/15 0330  CALCIUM  --  7.7* 7.7* 8.0*  MG 1.7 1.7  --   --   PHOS 2.7 2.9  --   --    Sepsis  Markers  Recent Labs Lab 07/12/2015 1916 07/12/15 0045 07/12/15 0447  LATICACIDVEN 8.8* 5.0* 1.9   ABG  Recent Labs Lab 07/01/2015 2352 07/12/15 0405  PHART 7.177* 7.392  PCO2ART 51.2* 33.0*  PO2ART >450* 179.0*   Liver Enzymes  Recent Labs Lab 07/12/2015 1923 07/13/15 0330  AST 555* 131*  ALT 522* 210*  ALKPHOS 107 64  BILITOT 0.8 1.3*  ALBUMIN 3.2* 2.3*   Cardiac Enzymes  Recent Labs Lab 06/21/2015 2132 07/12/15 0446 07/12/15 1000  TROPONINI 0.64* 0.94* 0.54*   Glucose  Recent Labs Lab 07/13/15 1141 07/13/15 1533 07/13/15 2006 07/13/15 2357 07/14/15 0410 07/14/15 0742  GLUCAP 184* 179* 116* 146* 157* 147*    Imaging No results found.   ASSESSMENT / PLAN:  PULMONARY OETT 8/24 >>> A: Acute respiratory failure in setting cardiac arrest  P:   Full vent support VAP bundle   CARDIOVASCULAR CVL 8/24 >>> A:  PEA arrest CAD Ischemic cardiomyopathy H/o HTN Prolonged QTc on EKG 8/25 Echo >> nml LV fn P:  MAP goal > 65 mm/Hg    RENAL  Recent Labs Lab 07/12/15 0446 07/13/15 0330 07/14/15 0330  K 3.7 3.4* 3.6     A:   hypokalemia  P: replete lytes as needed   GASTROINTESTINAL A:  GERD Transaminitis > suspect shock liver, LFTs trending down  P:   NPO PPI   HEMATOLOGIC A:   H/o DVT/PE on xarelto Duplex neg P:  Hold xarelto Ct heparin gtt    INFECTIOUS A:   ? Aspiration PNA  P:   BCx2 8/24 >>> UC 8/24 >>> Sputum 8/24 >>> Abx: Zosyn, start date 8/24 >>> Abx: Linezolid start date 8/24 >>> Abx: Doxy, start date 8/24 >>>8/25 UA pending  ENDOCRINE CBG (last 3)   Recent Labs  07/13/15 2357 07/14/15 0410 07/14/15 0742  GLUCAP 146* 157* 147*    A:   DM  P:   CBG and SSI  NEUROLOGIC A:   Acute anoxic encephalopathy - no evidence of CVA or edema on imaging Post anoxic seizures  P:   RASS goal: 0 Fentanyl prn  Rpt EEG for seizure 8/26 Consulted neurology - ct keppra, added depakote, see  note   FAMILY  - Updates: 8/26  Discussed with family incl 2 daughters . Updated about her mothers status and that prognosis of neurologic recovery is extremely guarded.DNR - plan for withdrawal after neuro prognostication at 72h. 48 h from 8/27  - Inter-disciplinary family meet or Palliative Care meeting due by:  done   Richardson Landry Minor ACNP Maryanna Shape PCCM Pager [phone] till 3 pm If no answer page [phone] 07/14/2015, 8:43 AM

## 2015-07-15 ENCOUNTER — Inpatient Hospital Stay (HOSPITAL_COMMUNITY): Payer: PPO

## 2015-07-15 DIAGNOSIS — R40243 Glasgow coma scale score 3-8: Secondary | ICD-10-CM

## 2015-07-15 LAB — BASIC METABOLIC PANEL
ANION GAP: 7 (ref 5–15)
BUN: 12 mg/dL (ref 6–20)
CO2: 24 mmol/L (ref 22–32)
Calcium: 8 mg/dL — ABNORMAL LOW (ref 8.9–10.3)
Chloride: 110 mmol/L (ref 101–111)
Creatinine, Ser: 0.82 mg/dL (ref 0.44–1.00)
GFR calc non Af Amer: >60 mL/min (ref >60)
GLUCOSE: 161 mg/dL — AB (ref 65–99)
POTASSIUM: 3.4 mmol/L — AB (ref 3.5–5.1)
Sodium: 141 mmol/L (ref 135–145)

## 2015-07-15 LAB — GLUCOSE, CAPILLARY
GLUCOSE-CAPILLARY: 156 mg/dL — AB (ref 65–99)
GLUCOSE-CAPILLARY: 150 mg/dL — AB (ref 65–99)
GLUCOSE-CAPILLARY: 136 mg/dL — AB (ref 65–99)
GLUCOSE-CAPILLARY: 133 mg/dL — AB (ref 65–99)
Glucose-Capillary: 155 mg/dL — ABNORMAL HIGH (ref 65–99)

## 2015-07-15 LAB — CBC
HEMATOCRIT: 29.8 % — AB (ref 36.0–46.0)
HEMOGLOBIN: 9.7 g/dL — AB (ref 12.0–15.0)
MCH: 27.9 pg (ref 26.0–34.0)
MCHC: 32.6 g/dL (ref 30.0–36.0)
MCV: 85.6 fL (ref 78.0–100.0)
Platelets: 123 10*3/uL — ABNORMAL LOW (ref 150–400)
RBC: 3.48 MIL/uL — AB (ref 3.87–5.11)
RDW: 14.1 % (ref 11.5–15.5)
WBC: 8.4 10*3/uL (ref 4.0–10.5)

## 2015-07-15 LAB — MRSA CULTURE

## 2015-07-15 MED ORDER — SODIUM CHLORIDE 0.9 % IV SOLN
1.0000 mg/h | INTRAVENOUS | Status: DC
  Administered 2015-07-15: 2 mg/h via INTRAVENOUS
  Filled 2015-07-15: qty 10

## 2015-07-15 MED ORDER — LORAZEPAM 2 MG/ML IJ SOLN
2.0000 mg | Freq: Two times a day (BID) | INTRAMUSCULAR | Status: DC | PRN
  Administered 2015-07-15: 2 mg via INTRAVENOUS
  Filled 2015-07-15: qty 1

## 2015-07-15 MED ORDER — POTASSIUM CHLORIDE 10 MEQ/50ML IV SOLN
10.0000 meq | INTRAVENOUS | Status: AC
  Administered 2015-07-15 (×2): 10 meq via INTRAVENOUS
  Filled 2015-07-15 (×2): qty 50

## 2015-07-16 ENCOUNTER — Ambulatory Visit: Payer: PPO | Admitting: Hematology & Oncology

## 2015-07-16 ENCOUNTER — Other Ambulatory Visit: Payer: PPO

## 2015-07-17 LAB — CULTURE, BLOOD (ROUTINE X 2)
CULTURE: NO GROWTH
Culture: NO GROWTH

## 2015-07-18 ENCOUNTER — Telehealth: Payer: Self-pay

## 2015-07-18 NOTE — Telephone Encounter (Signed)
On 08/15/2015 I received a death certificate from [facility]. The death certificate is for cremation. The patient is a patient of Doctor Ramasway. The death certificate will be taken to the pulmonary unit for signature this pm. On 08/15/15 I received the death certificate from [facility]. I got the death certificate ready and faxed the death certificate to the funeral home per their request.

## 2015-07-19 NOTE — Progress Notes (Signed)
Family contacted r/t decrease in Pt heart rate and resp. Rate. Advised that Pt deceasing is rapidly approaching to come to hospital. Approximately 2245 Pt ECG shows asystole with very few abherent beats. Complete asystole and no heart beat auscultated over apex witnessed by 2 RNs. Pt declared deceased at March 02, 2245. Awaiting Pt family before proceeding with body preparation. CDS to be notified.

## 2015-07-19 NOTE — Progress Notes (Signed)
Pt's family, Kriste (daughter), expressed wishes to withdraw care after discussion with Neuro MD. Dr. Chase Caller informed of family's wishes.  MD will meet with family shortly. Family updated. Will continue to monitor pt closely.

## 2015-07-19 NOTE — Procedures (Signed)
Extubation Procedure Note  Patient Details:   Name: [name] DOB: [birth year] MRN: [id number]   Airway Documentation:     Evaluation  O2 sats: stable throughout Complications: No apparent complications Patient did tolerate procedure well. Bilateral Breath Sounds: Clear, Diminished, Other (Comment) (coarse) Suctioning: Airway No, pt not able to speak d/t withdrawal of life support.  Family at bedside, all questions answered.  Pt appeared comfortable t/o process.  RN at bedside.   Lenna Sciara 29-Jul-2015, 4:07 PM

## 2015-07-19 NOTE — Plan of Care (Signed)
Problem: Acute Treatment Outcomes Goal: Airway maintained/protected Outcome: Progressing Pt intubated on ventilator since P.E.A. Arrest.

## 2015-07-19 NOTE — Progress Notes (Signed)
Subjective: No further seizures seen.   Exam: Filed Vitals:   23-Jul-2015 1201  BP:   Pulse:   Temp: 96.9 F (36.1 C)  Resp:    Gen: In bed, NAD Resp: non-labored breathing, no acute distress Abd: soft, nt  Neuro: MS: minimal eye opening to noxious stimuli, though the patient does grimace.   ZS:MOLMB, corneals intact Motor: Withdraws her right upper extremity, felxion on the left. Withdraw vs flexion bilateral lower extremities.    Impression: 74 yo  F with anoxic brain injury and post-anoxic seizures. She is currently on three antiepileptics. MRI reveals both thalamic as well as cerebellar diffusion changes. I think that the likelihood of recovery(meaning ability to interact, but still not normal) in her case is small, but there are no markers in her case that are definite. I discussed this with the family and outlined the options of  Comfort only care vs continued observation for improvement. I discussed with them that by early to middle of next week, we may need to start thinking about trach/peg if they felt like the patient would want to be aggressive.   Recommendations: 1) Continue vimpat, depakote, keppra.  2) neurology will continue to follow.   Roland Rack, MD [facility] [phone]  If 7pm- 7am, please page neurology on call as listed in [place].

## 2015-07-19 NOTE — Progress Notes (Signed)
Transported to MRI and back with any issues. Patient back in room and on Regular vent settings. On 100% o2 during MRI and all other vent settings the same

## 2015-07-19 NOTE — Progress Notes (Signed)
ANTICOAGULATION CONSULT NOTE - Follow Up Consult  Pharmacy Consult for Heparin Indication: Hx PE, on Xarelto pta  Allergies  Allergen Reactions  . Metformin Diarrhea  . Pioglitazone Other (See Comments)    weight gain  . Vancomycin Rash    Erythematous rash, hands and toes became cyanotic.      Patient Measurements: Height: 5\' 6"  (167.6 cm) Weight: 207 lb 3.7 oz (94 kg) IBW/kg (Calculated) : 59.3 Heparin Dosing Weight:   Vital Signs: Temp: 98.7 F (37.1 C) (08/28 0753) Temp Source: Axillary (08/28 0753) BP: 165/44 mmHg (08/28 0800) Pulse Rate: 52 (08/28 0800)  Labs:  Recent Labs  07/12/15 1000 07/12/15 1300  07/13/15 0330 07/14/15 0330 August 05, 2015 0341  HGB  --   --   < > 10.7* 9.7* 9.7*  HCT  --   --   --  32.8* 30.3* 29.8*  PLT  --   --   --  145* 134* 123*  APTT  --  100*  --  66*  --   --   HEPARINUNFRC  --  0.62  --  0.49 0.30  --   CREATININE  --   --   --  0.94 0.87 0.82  TROPONINI 0.54*  --   --   --   --   --   < > = values in this interval not displayed.  Estimated Creatinine Clearance: 68.5 mL/min (by C-G formula based on Cr of 0.82).   Medications:  Scheduled:  . antiseptic oral rinse  7 mL Mouth Rinse 10 times per day  . chlorhexidine gluconate  15 mL Mouth Rinse BID  . insulin aspart  2-6 Units Subcutaneous 6 times per day  . lacosamide (VIMPAT) IV  100 mg Intravenous Q12H  . levETIRAcetam  1,500 mg Intravenous BID  . pantoprazole (PROTONIX) IV  40 mg Intravenous Daily  . piperacillin-tazobactam (ZOSYN)  IV  3.375 g Intravenous 3 times per day  . valproate sodium  500 mg Intravenous 3 times per day    Assessment: 74yo female on heparin bridge for hx PE.  Hg and pltc ~stable.  HL was not obtained as labs were drawn from Heparin line and Lab was unsuccessful drawing blood for HL.  Heparin was adjusted yesterday.  No bleeding noted.    Goal of Therapy:  Heparin level 0.3-0.7 units/ml Monitor platelets by anticoagulation protocol: Yes   Plan:   Try for Heparin level this afternoon Continue Heparin at current rate  Gracy Bruins, [facility]

## 2015-07-19 NOTE — Progress Notes (Signed)
Family at bedside, emotional support provided.

## 2015-07-19 NOTE — Progress Notes (Signed)
PULMONARY / CRITICAL CARE MEDICINE   Name: [name] MRN: [id number] DOB: 1941/04/24    ADMISSION DATE:  07/02/2015 CONSULTATION DATE:  07/17/2015  REFERRING MD :  EDP Dr Roxanne Mins  CHIEF COMPLAINT:  Cardiac arrest  INITIAL PRESENTATION: 74 year old female, Jehova's witness, presented to [facility] ED 8/24 in progress PEA arrest, prolonged downtime. 40 mins ACLS required for ROSC, however, unknown downtime prior to that. As many as several hours. PCCM to admit.   STUDIES:  CT head 8/24 >>>Findings suspicious for acute/subacute ischemia involving the anterior right temporal lobe, and possibly left temporal lobe. 8/25 EEG >> consistent with a severe generalized dysfunction with potential epileptogenicity from the central regions. 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  8/25 duplex neg 8/26 head CT neg  SIGNIFICANT EVENTS: Currently in 24 of 72 hr observation period for possible WD care.  HISTORY OF PRESENT ILLNESS:  74 year old female with PMH as below, which includes DM, CAD, DVT/PE on xarelto, SLE, Breast Ca, seizures, and ischemic cardiomyopathy. 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 Has recently been admitted for urosepsis. 8/24 she was last seen normal at about 12 noon. Her family left her for several hours, and returned home at 1830 to find her unresponsive. EMS was called, and upon their arrival she was noted to be in PEA. She received 20 mins of ACLS prior to ROSC. She was intubated in route and transported to ED. In ED she suffered another brief PEA arrest of about 4 minutes. Initially post arrest she was completely unresponsive with GCS 3, however, she has developed some posturing since that time. PCCM has been asked to admit. Of note there is questionable seizure activity a year ago, and possibly the day before the arrest, however, grandsons who are reporting this are unclear of events.    [place] unresponsive, critically  ill Good UO   VITAL SIGNS: Temp:  [98.3 F (36.8 C)-99.3 F (37.4 C)] 98.7 F (37.1 C) (08/28 0753) Pulse Rate:  [34-75] 61 (08/28 0700) Resp:  [13-22] 17 (08/28 0700) BP: (118-194)/(37-59) 192/53 mmHg (08/28 0700) SpO2:  [96 %-100 %] 100 % (08/28 0700) Arterial Line BP: (135-218)/(39-62) 188/44 mmHg (08/27 1700) FiO2 (%):  [40 %] 40 % (08/28 0343) Weight:  [207 lb 3.7 oz (94 kg)] 207 lb 3.7 oz (94 kg) (08/28 0500) HEMODYNAMICS:   VENTILATOR SETTINGS: Vent Mode:  [-] PRVC FiO2 (%):  [40 %] 40 % Set Rate:  [15 bmp] 15 bmp Vt Set:  [500 mL] 500 mL PEEP:  [5 cmH20] 5 cmH20 Plateau Pressure:  [14 cmH20-20 cmH20] 18 cmH20 INTAKE / OUTPUT:  Intake/Output Summary (Last 24 hours) at 2015-07-22 0833 Last data filed at 2015/07/22 0700  Gross per 24 hour  Intake 1876.12 ml  Output   1175 ml  Net 701.12 ml    PHYSICAL EXAMINATION: General:  Elderly female in no distress on vent Neuro:  GCS 3 , comatose, no response to DPS,pupils 31mm BERTL, upward gaze, plantars Bl upgoing, occasional quivering of lips & eyelids per RN HEENT:  [place]/AT, no JVD noted Cardiovascular:  RRR, no MRG Lungs:  Clear bilateral breath sounds Abdomen:  Soft, non-distended, faint bs Musculoskeletal:  No acute deformity or edema Skin:  Grossly intact, warm  LABS:  CBC  Recent Labs Lab 07/13/15 0330 07/14/15 0330 2015-07-22 0341  WBC 12.5* 9.8 8.4  HGB 10.7* 9.7* 9.7*  HCT 32.8* 30.3* 29.8*  PLT 145* 134* 123*  Coag's  Recent Labs Lab 07/12/15 0030 07/12/15 1300 07/13/15 0330  APTT 33 100* 66*  INR 1.53*  --   --    BMET  Recent Labs Lab 07/13/15 0330 07/14/15 0330 July 22, 2015 0341  NA 139 142 141  K 3.4* 3.6 3.4*  CL 111 113* 110  CO2 21* 25 24  BUN 16 16 12   CREATININE 0.94 0.87 0.82  GLUCOSE 183* 174* 161*   Electrolytes  Recent Labs Lab 07/12/15 0030 07/12/15 0446 07/13/15 0330 07/14/15 0330 2015-07-22 0341  CALCIUM  --  7.7* 7.7* 8.0* 8.0*  MG 1.7 1.7  --   --   --   PHOS  2.7 2.9  --   --   --    Sepsis Markers  Recent Labs Lab 06/27/2015 1916 07/12/15 0045 07/12/15 0447  LATICACIDVEN 8.8* 5.0* 1.9   ABG  Recent Labs Lab 07/13/2015 2352 07/12/15 0405  PHART 7.177* 7.392  PCO2ART 51.2* 33.0*  PO2ART >450* 179.0*   Liver Enzymes  Recent Labs Lab 07/14/2015 1923 07/13/15 0330  AST 555* 131*  ALT 522* 210*  ALKPHOS 107 64  BILITOT 0.8 1.3*  ALBUMIN 3.2* 2.3*   Cardiac Enzymes  Recent Labs Lab 07/10/2015 2132 07/12/15 0446 07/12/15 1000  TROPONINI 0.64* 0.94* 0.54*   Glucose  Recent Labs Lab 07/14/15 1247 07/14/15 1601 07/14/15 2012 2015-07-22 0055 07-22-2015 0330 07-22-2015 0751  GLUCAP 148* 140* 155* 155* 150* 136*    Imaging Mr Brain Wo Contrast  July 22, 2015   CLINICAL DATA:  Cardiac arrest, anoxic brain injury with refractory post anoxic seizures. History of diabetes, hypertension, pulmonary embolism, breast cancer.  EXAM: MRI HEAD WITHOUT CONTRAST  TECHNIQUE: Multiplanar, multiecho pulse sequences of the brain and surrounding structures were obtained without intravenous contrast.  COMPARISON:  CT head July 13, 2015  FINDINGS: Generalized mild reduced diffusion within the bilateral thalamus and cerebellum, with very mildly increased T2 hyperintense signal. No abnormal supratentorial reduced diffusion. a faint T2 hyperintense signal within the thalamus bilaterally. 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 No susceptibility artifact to suggest hemorrhage. Mild gliosis RIGHT frontal lobe. No midline shift, mass effect or mass lesions.  No abnormal extra-axial fluid collections. Normal major intracranial vascular flow voids seen at the skull base. Layering secretions in the oropharynx. Trace paranasal sinus mucosal thickening without air-fluid level. Small bilateral mastoid effusions. Ocular globes and orbital contents are  unremarkable. No abnormal sellar expansion. No cerebellar tonsillar ectopia. No suspicious calvarial bone marrow signal. Patient is edentulous. Life support lines in place.  IMPRESSION: Mild generalized thalamus and cerebellar edema which can be seen with anoxic brain injury though, is not definitive. Findings can also be seen with acute hypertensive encephalopathy or toxic/metabolic disorder.  Old RIGHT basal ganglia lacunar infarct.   Electronically Signed   By: Elon Alas M.D.   On: 07-22-15 00:51     ASSESSMENT / PLAN:  PULMONARY OETT 8/24 >>> A: Acute respiratory failure in setting cardiac arrest  P:   Full vent support VAP bundle   CARDIOVASCULAR CVL 8/24 >>> A:  PEA arrest CAD Ischemic cardiomyopathy H/o HTN Prolonged QTc on EKG 8/25 Echo >> nml LV fn P:  MAP goal > 65 mm/Hg    RENAL  Recent Labs Lab 07/13/15 0330 07/14/15 0330 July 22, 2015 0341  K 3.4* 3.6 3.4*     A:   hypokalemia  P: replete lytes as needed  GASTROINTESTINAL A:  GERD Transaminitis > suspect shock liver, LFTs trending down  P:   NPO PPI   HEMATOLOGIC A:   H/o DVT/PE on xarelto Duplex neg P:  Hold xarelto Ct heparin gtt    INFECTIOUS A:   ? Aspiration PNA  P:   BCx2 8/24 >>> UC 8/24 >>>20,000 COLONIES/mL LACTOBACILLUS SPECIES >> 4,000 COLONIES/mL GRAM POSITIVE COCCI >> Sputum 8/24 >>> Abx: Zosyn, start date 8/24 >>> Abx: Linezolid start date 8/24 >>> Abx: Doxy, start date 8/24 >>>8/25   ENDOCRINE CBG (last 3)   Recent Labs  07/28/2015 0055 28-Jul-2015 0330 07-28-15 0751  GLUCAP 155* 150* 136*    A:   DM  P:   CBG and SSI  NEUROLOGIC A:   Acute anoxic encephalopathy - no evidence of CVA or edema on imaging Post anoxic seizures  P:   RASS goal: 0 Fentanyl prn  Rpt EEG for seizure 8/26 Consulted neurology - ct keppra, added depakote, see note, MRI results cw anoxic injury   FAMILY  - Updates: 8/26  Discussed with family incl 2  daughters . Updated about her mothers status and that prognosis of neurologic recovery is extremely guarded.DNR - plan for withdrawal after neuro prognostication at 72h. 48 h from 8/27  - Inter-disciplinary family meet or Palliative Care meeting due 8/29   [facility] Fumi Guadron ACNP Maryanna Shape PCCM Pager [phone] till 3 pm If no answer page [phone] 07/28/15, 8:33 AM

## 2015-07-19 DEATH — deceased

## 2015-07-21 NOTE — Discharge Summary (Signed)
DISCHARGE SUMMARY    Date of admit: 07/02/2015  7:17 PM Date of discharge: 07/16/2015 12:57 AM Length of Stay: 5 days  PCP is [name],[name], NP   PROBLEM LIST Active Problems:   Cardiac arrest   Acute respiratory failure   Coma   Anoxic brain injury    SUMMARY [name] was 74 y.o. 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 difficile diarrhea; Skin cancer; PONV (postoperative nausea and vomiting); Hypertension; Breast cancer; Refusal of blood transfusions as patient is Jehovah's Witness; CAD (coronary artery disease); Ischemic cardiomyopathy; HTN (hypertension); Dyslipidemia; Transaminitis; Diabetes mellitus without complication; and Diastolic congestive heart failure.   has past surgical history that includes Cholecystectomy; Breast biopsy; Breast lumpectomy; Appendectomy; Skin cancer excision; Breast lumpectomy with needle localization and axillary sentinel lymph node bx (Right, 06/17/2013); Irrigation and debridement abscess (Right, 02/18/2014); and left heart cath (N/A, 07/22/2014).   Admitted on 06/25/2015 with  74 year old female, Jehova's witness, presented to [facility] ED 8/24 in progress PEA arrest, prolonged downtime. 40 mins ACLS required for ROSC, however, unknown downtime prior to that but possibly as many as several hours. PCCM admitted  STUDIES:  CT head 8/24 >>>Findings suspicious for acute/subacute ischemia involving the anterior right temporal lobe, and possibly left temporal lobe. 8/25 EEG >> consistent with a severe generalized dysfunction with potential epileptogenicity from the central regions. 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  8/25  duplex neg 8/26 head CT neg   COURSE  - one of post anoxic seizures and coma. She needed 3 anti-epileptic drugs. She was comatose off sedation. Prognostic meetings were held by neurology. Family terminally weaned support based on very poor prognosis and patient expired 07/16/2015  SIGNED Dr. Brand Males, M.D., [facility].C.P Pulmonary and Critical Care Medicine Staff Physician [facility] Pager: [phone], If no answer or between  15:00h - 7:00h: call [phone]  07/21/2015 3:08 AM

## 2015-07-24 ENCOUNTER — Telehealth: Payer: Self-pay

## 2015-07-24 NOTE — Telephone Encounter (Signed)
Received original certificate for Dr. Chase Caller 07-24-15 from [facility] [place].  He will be in office this afternoon.  Will take to pulmonary and leave for signature.  Received back 07-25-15 and mailed to [facility].

## 2015-07-25 LAB — NEURON-SPECIFIC ENOLASE(NSE), BLOOD

## 2015-08-07 ENCOUNTER — Telehealth: Payer: Self-pay | Admitting: *Deleted

## 2015-08-07 NOTE — Telephone Encounter (Signed)
Pt's daughter dropped off attending physician statement for life insurance forms for [name] to complete. Forwarded to [employer]. JG//CMA

## 2015-08-07 NOTE — Telephone Encounter (Signed)
Pt daughter states these are needed tomorrow morning (before noon). She said this is what life insurance company told her today. Please call ASAP. Call daughter Janila [phone].

## 2015-08-08 NOTE — Telephone Encounter (Signed)
These forms are very detailed in nature. Our turnaround time is 5-7 business days. I will forward this to [facility].

## 2015-08-08 NOTE — Telephone Encounter (Signed)
Original placed at front desk for pick up. Notified pt's daughter and sent copy for scanning.

## 2015-08-15 ENCOUNTER — Telehealth (HOSPITAL_BASED_OUTPATIENT_CLINIC_OR_DEPARTMENT_OTHER): Payer: Self-pay | Admitting: Emergency Medicine

## 2016-04-26 ENCOUNTER — Other Ambulatory Visit: Payer: Self-pay | Admitting: Nurse Practitioner
